# Patient Record
Sex: Female | Born: 1945 | Race: Black or African American | Hispanic: No | Smoking: Former smoker
Health system: Southern US, Community
[De-identification: ages and names within clinical notes are randomized; demographics above are authoritative.]

## PROBLEM LIST (undated history)

## (undated) DIAGNOSIS — N186 End stage renal disease: Secondary | ICD-10-CM

## (undated) DIAGNOSIS — R0789 Other chest pain: Secondary | ICD-10-CM

## (undated) DIAGNOSIS — I739 Peripheral vascular disease, unspecified: Secondary | ICD-10-CM

## (undated) DIAGNOSIS — I5189 Other ill-defined heart diseases: Secondary | ICD-10-CM

## (undated) DIAGNOSIS — D649 Anemia, unspecified: Secondary | ICD-10-CM

## (undated) DIAGNOSIS — R9439 Abnormal result of other cardiovascular function study: Secondary | ICD-10-CM

## (undated) DIAGNOSIS — I10 Essential (primary) hypertension: Secondary | ICD-10-CM

## (undated) DIAGNOSIS — H3530 Unspecified macular degeneration: Secondary | ICD-10-CM

## (undated) DIAGNOSIS — H409 Unspecified glaucoma: Secondary | ICD-10-CM

## (undated) DIAGNOSIS — E1129 Type 2 diabetes mellitus with other diabetic kidney complication: Secondary | ICD-10-CM

## (undated) DIAGNOSIS — M1990 Unspecified osteoarthritis, unspecified site: Secondary | ICD-10-CM

## (undated) DIAGNOSIS — E11319 Type 2 diabetes mellitus with unspecified diabetic retinopathy without macular edema: Secondary | ICD-10-CM

## (undated) DIAGNOSIS — E785 Hyperlipidemia, unspecified: Secondary | ICD-10-CM

## (undated) DIAGNOSIS — IMO0002 Reserved for concepts with insufficient information to code with codable children: Secondary | ICD-10-CM

## (undated) DIAGNOSIS — Z8669 Personal history of other diseases of the nervous system and sense organs: Secondary | ICD-10-CM

## (undated) DIAGNOSIS — N189 Chronic kidney disease, unspecified: Secondary | ICD-10-CM

## (undated) DIAGNOSIS — K219 Gastro-esophageal reflux disease without esophagitis: Secondary | ICD-10-CM

## (undated) DIAGNOSIS — Z992 Dependence on renal dialysis: Secondary | ICD-10-CM

## (undated) DIAGNOSIS — R9430 Abnormal result of cardiovascular function study, unspecified: Secondary | ICD-10-CM

## (undated) DIAGNOSIS — I2510 Atherosclerotic heart disease of native coronary artery without angina pectoris: Secondary | ICD-10-CM

## (undated) DIAGNOSIS — T82898A Other specified complication of vascular prosthetic devices, implants and grafts, initial encounter: Secondary | ICD-10-CM

## (undated) DIAGNOSIS — R7989 Other specified abnormal findings of blood chemistry: Secondary | ICD-10-CM

## (undated) DIAGNOSIS — R0609 Other forms of dyspnea: Secondary | ICD-10-CM

## (undated) DIAGNOSIS — I4891 Unspecified atrial fibrillation: Secondary | ICD-10-CM

## (undated) DIAGNOSIS — D631 Anemia in chronic kidney disease: Secondary | ICD-10-CM

## (undated) HISTORY — DX: Other ill-defined heart diseases: I51.89

## (undated) HISTORY — PX: TONSILLECTOMY: SUR1361

## (undated) HISTORY — DX: Anemia in chronic kidney disease: D63.1

## (undated) HISTORY — PX: LAPAROTOMY: SHX154

## (undated) HISTORY — DX: Essential (primary) hypertension: I10

## (undated) HISTORY — DX: Chronic kidney disease, unspecified: N18.9

## (undated) HISTORY — PX: COLONOSCOPY: SHX174

## (undated) HISTORY — DX: Other specified abnormal findings of blood chemistry: R79.89

## (undated) HISTORY — DX: Other specified complication of vascular prosthetic devices, implants and grafts, initial encounter: T82.898A

## (undated) HISTORY — DX: Abnormal result of cardiovascular function study, unspecified: R94.30

## (undated) HISTORY — DX: Other forms of dyspnea: R06.09

## (undated) HISTORY — PX: ABDOMINAL HYSTERECTOMY: SHX81

## (undated) HISTORY — DX: Other chest pain: R07.89

## (undated) HISTORY — PX: OTHER SURGICAL HISTORY: SHX169

## (undated) HISTORY — DX: Atherosclerotic heart disease of native coronary artery without angina pectoris: I25.10

## (undated) HISTORY — DX: Reserved for concepts with insufficient information to code with codable children: IMO0002

## (undated) HISTORY — DX: Gastro-esophageal reflux disease without esophagitis: K21.9

## (undated) HISTORY — DX: Unspecified osteoarthritis, unspecified site: M19.90

## (undated) HISTORY — DX: Abnormal result of other cardiovascular function study: R94.39

## (undated) HISTORY — DX: Peripheral vascular disease, unspecified: I73.9

## (undated) HISTORY — DX: Personal history of other diseases of the nervous system and sense organs: Z86.69

## (undated) HISTORY — DX: Hyperlipidemia, unspecified: E78.5

## (undated) HISTORY — PX: ARTERIOVENOUS GRAFT PLACEMENT: SUR1029

## (undated) HISTORY — PX: EYE SURGERY: SHX253

## (undated) HISTORY — DX: Type 2 diabetes mellitus with other diabetic kidney complication: E11.29

---

## 2005-11-25 DIAGNOSIS — Z8669 Personal history of other diseases of the nervous system and sense organs: Secondary | ICD-10-CM

## 2005-11-25 HISTORY — DX: Personal history of other diseases of the nervous system and sense organs: Z86.69

## 2007-05-14 ENCOUNTER — Other Ambulatory Visit: Admission: RE | Admit: 2007-05-14 | Discharge: 2007-05-14 | Payer: Self-pay | Admitting: Internal Medicine

## 2010-08-15 ENCOUNTER — Ambulatory Visit: Payer: Self-pay | Admitting: Gynecology

## 2010-08-15 ENCOUNTER — Other Ambulatory Visit: Payer: Self-pay | Admitting: Obstetrics & Gynecology

## 2010-08-16 ENCOUNTER — Inpatient Hospital Stay (HOSPITAL_COMMUNITY): Admission: EM | Admit: 2010-08-16 | Discharge: 2010-08-22 | Payer: Self-pay | Admitting: Emergency Medicine

## 2011-01-23 ENCOUNTER — Other Ambulatory Visit (HOSPITAL_COMMUNITY): Payer: Self-pay | Admitting: Nephrology

## 2011-01-23 ENCOUNTER — Ambulatory Visit (HOSPITAL_COMMUNITY)
Admission: RE | Admit: 2011-01-23 | Discharge: 2011-01-23 | Disposition: A | Discharge status: Home / Self Care | Payer: 59 | Source: Ambulatory Visit | Attending: Nephrology | Admitting: Nephrology

## 2011-01-23 DIAGNOSIS — N186 End stage renal disease: Secondary | ICD-10-CM

## 2011-02-07 LAB — PROTIME-INR: Prothrombin Time: 13 seconds (ref 11.6–15.2)

## 2011-02-07 LAB — URINALYSIS, ROUTINE W REFLEX MICROSCOPIC
Bilirubin Urine: NEGATIVE
Glucose, UA: NEGATIVE mg/dL
Ketones, ur: NEGATIVE mg/dL
Nitrite: NEGATIVE
Protein, ur: >300 mg/dL — AB
pH: 5.5 (ref 5.0–8.0)

## 2011-02-07 LAB — DIFFERENTIAL
Basophils Absolute: 0 10*3/uL (ref 0.0–0.1)
Basophils Absolute: 0 10*3/uL (ref 0.0–0.1)
Basophils Absolute: 0 10*3/uL (ref 0.0–0.1)
Basophils Relative: 0 % (ref 0–1)
Basophils Relative: 1 % (ref 0–1)
Basophils Relative: 1 % (ref 0–1)
Eosinophils Absolute: 0 10*3/uL (ref 0.0–0.7)
Eosinophils Relative: 0 % (ref 0–5)
Eosinophils Relative: 1 % (ref 0–5)
Eosinophils Relative: 1 % (ref 0–5)
Lymphocytes Relative: 32 % (ref 12–46)
Lymphocytes Relative: 42 % (ref 12–46)
Lymphocytes Relative: 51 % — ABNORMAL HIGH (ref 12–46)
Lymphocytes Relative: 52 % — ABNORMAL HIGH (ref 12–46)
Lymphs Abs: 1.4 10*3/uL (ref 0.7–4.0)
Lymphs Abs: 2.2 10*3/uL (ref 0.7–4.0)
Monocytes Absolute: 0.4 10*3/uL (ref 0.1–1.0)
Monocytes Absolute: 0.5 10*3/uL (ref 0.1–1.0)
Monocytes Absolute: 0.7 10*3/uL (ref 0.1–1.0)
Monocytes Relative: 10 % (ref 3–12)
Monocytes Relative: 7 % (ref 3–12)
Monocytes Relative: 7 % (ref 3–12)
Monocytes Relative: 7 % (ref 3–12)
Neutro Abs: 1.8 10*3/uL (ref 1.7–7.7)
Neutro Abs: 2.4 10*3/uL (ref 1.7–7.7)
Neutro Abs: 2.5 10*3/uL (ref 1.7–7.7)
Neutro Abs: 3.3 10*3/uL (ref 1.7–7.7)
Neutro Abs: 3.7 10*3/uL (ref 1.7–7.7)
Neutrophils Relative %: 46 % (ref 43–77)
Neutrophils Relative %: 50 % (ref 43–77)
Neutrophils Relative %: 66 % (ref 43–77)

## 2011-02-07 LAB — COMPREHENSIVE METABOLIC PANEL
ALT: 26 U/L (ref 0–35)
ALT: 38 U/L — ABNORMAL HIGH (ref 0–35)
AST: 28 U/L (ref 0–37)
AST: 39 U/L — ABNORMAL HIGH (ref 0–37)
AST: 41 U/L — ABNORMAL HIGH (ref 0–37)
AST: 52 U/L — ABNORMAL HIGH (ref 0–37)
Albumin: 2.6 g/dL — ABNORMAL LOW (ref 3.5–5.2)
Albumin: 2.8 g/dL — ABNORMAL LOW (ref 3.5–5.2)
Albumin: 2.8 g/dL — ABNORMAL LOW (ref 3.5–5.2)
Albumin: 3.3 g/dL — ABNORMAL LOW (ref 3.5–5.2)
Alkaline Phosphatase: 29 U/L — ABNORMAL LOW (ref 39–117)
Alkaline Phosphatase: 32 U/L — ABNORMAL LOW (ref 39–117)
Alkaline Phosphatase: 33 U/L — ABNORMAL LOW (ref 39–117)
Alkaline Phosphatase: 33 U/L — ABNORMAL LOW (ref 39–117)
BUN: 24 mg/dL — ABNORMAL HIGH (ref 6–23)
BUN: 46 mg/dL — ABNORMAL HIGH (ref 6–23)
BUN: 58 mg/dL — ABNORMAL HIGH (ref 6–23)
BUN: 64 mg/dL — ABNORMAL HIGH (ref 6–23)
CO2: 25 mEq/L (ref 19–32)
CO2: 26 mEq/L (ref 19–32)
CO2: 28 mEq/L (ref 19–32)
Calcium: 8.8 mg/dL (ref 8.4–10.5)
Calcium: 9.9 mg/dL (ref 8.4–10.5)
Chloride: 103 mEq/L (ref 96–112)
Chloride: 105 mEq/L (ref 96–112)
Creatinine, Ser: 4.1 mg/dL — ABNORMAL HIGH (ref 0.4–1.2)
Creatinine, Ser: 4.14 mg/dL — ABNORMAL HIGH (ref 0.4–1.2)
Creatinine, Ser: 5.84 mg/dL — ABNORMAL HIGH (ref 0.4–1.2)
Creatinine, Ser: 6.66 mg/dL — ABNORMAL HIGH (ref 0.4–1.2)
GFR calc Af Amer: 10 mL/min — ABNORMAL LOW (ref >60)
GFR calc Af Amer: 13 mL/min — ABNORMAL LOW (ref >60)
GFR calc Af Amer: 7 mL/min — ABNORMAL LOW (ref >60)
GFR calc non Af Amer: 11 mL/min — ABNORMAL LOW (ref >60)
GFR calc non Af Amer: 6 mL/min — ABNORMAL LOW (ref >60)
GFR calc non Af Amer: 8 mL/min — ABNORMAL LOW (ref >60)
Glucose, Bld: 170 mg/dL — ABNORMAL HIGH (ref 70–99)
Glucose, Bld: 171 mg/dL — ABNORMAL HIGH (ref 70–99)
Potassium: 3.6 mEq/L (ref 3.5–5.1)
Potassium: 4.1 mEq/L (ref 3.5–5.1)
Potassium: 4.4 mEq/L (ref 3.5–5.1)
Potassium: 4.5 mEq/L (ref 3.5–5.1)
Potassium: 4.7 mEq/L (ref 3.5–5.1)
Sodium: 140 mEq/L (ref 135–145)
Total Bilirubin: 0.4 mg/dL (ref 0.3–1.2)
Total Bilirubin: 0.5 mg/dL (ref 0.3–1.2)
Total Bilirubin: 0.5 mg/dL (ref 0.3–1.2)
Total Protein: 5.3 g/dL — ABNORMAL LOW (ref 6.0–8.3)
Total Protein: 5.3 g/dL — ABNORMAL LOW (ref 6.0–8.3)
Total Protein: 5.3 g/dL — ABNORMAL LOW (ref 6.0–8.3)
Total Protein: 6.1 g/dL (ref 6.0–8.3)

## 2011-02-07 LAB — GLUCOSE, CAPILLARY
Glucose-Capillary: 107 mg/dL — ABNORMAL HIGH (ref 70–99)
Glucose-Capillary: 116 mg/dL — ABNORMAL HIGH (ref 70–99)
Glucose-Capillary: 116 mg/dL — ABNORMAL HIGH (ref 70–99)
Glucose-Capillary: 120 mg/dL — ABNORMAL HIGH (ref 70–99)
Glucose-Capillary: 132 mg/dL — ABNORMAL HIGH (ref 70–99)
Glucose-Capillary: 138 mg/dL — ABNORMAL HIGH (ref 70–99)
Glucose-Capillary: 139 mg/dL — ABNORMAL HIGH (ref 70–99)
Glucose-Capillary: 153 mg/dL — ABNORMAL HIGH (ref 70–99)
Glucose-Capillary: 163 mg/dL — ABNORMAL HIGH (ref 70–99)
Glucose-Capillary: 170 mg/dL — ABNORMAL HIGH (ref 70–99)
Glucose-Capillary: 174 mg/dL — ABNORMAL HIGH (ref 70–99)
Glucose-Capillary: 175 mg/dL — ABNORMAL HIGH (ref 70–99)
Glucose-Capillary: 175 mg/dL — ABNORMAL HIGH (ref 70–99)
Glucose-Capillary: 180 mg/dL — ABNORMAL HIGH (ref 70–99)
Glucose-Capillary: 180 mg/dL — ABNORMAL HIGH (ref 70–99)
Glucose-Capillary: 227 mg/dL — ABNORMAL HIGH (ref 70–99)
Glucose-Capillary: 232 mg/dL — ABNORMAL HIGH (ref 70–99)
Glucose-Capillary: 278 mg/dL — ABNORMAL HIGH (ref 70–99)
Glucose-Capillary: 293 mg/dL — ABNORMAL HIGH (ref 70–99)
Glucose-Capillary: 299 mg/dL — ABNORMAL HIGH (ref 70–99)
Glucose-Capillary: 310 mg/dL — ABNORMAL HIGH (ref 70–99)
Glucose-Capillary: 329 mg/dL — ABNORMAL HIGH (ref 70–99)
Glucose-Capillary: 367 mg/dL — ABNORMAL HIGH (ref 70–99)
Glucose-Capillary: 399 mg/dL — ABNORMAL HIGH (ref 70–99)
Glucose-Capillary: 73 mg/dL (ref 70–99)
Glucose-Capillary: 96 mg/dL (ref 70–99)

## 2011-02-07 LAB — HEPATITIS B SURFACE ANTIGEN: Hepatitis B Surface Ag: NEGATIVE

## 2011-02-07 LAB — PHOSPHORUS: Phosphorus: 3.9 mg/dL (ref 2.3–4.6)

## 2011-02-07 LAB — CBC
HCT: 27.3 % — ABNORMAL LOW (ref 36.0–46.0)
HCT: 28 % — ABNORMAL LOW (ref 36.0–46.0)
Hemoglobin: 10.1 g/dL — ABNORMAL LOW (ref 12.0–15.0)
Hemoglobin: 6.8 g/dL — CL (ref 12.0–15.0)
Hemoglobin: 7.3 g/dL — ABNORMAL LOW (ref 12.0–15.0)
Hemoglobin: 8.9 g/dL — ABNORMAL LOW (ref 12.0–15.0)
Hemoglobin: 9.4 g/dL — ABNORMAL LOW (ref 12.0–15.0)
MCH: 27.1 pg (ref 26.0–34.0)
MCH: 27.1 pg (ref 26.0–34.0)
MCH: 28.1 pg (ref 26.0–34.0)
MCH: 28.2 pg (ref 26.0–34.0)
MCH: 28.3 pg (ref 26.0–34.0)
MCH: 28.6 pg (ref 26.0–34.0)
MCHC: 32.2 g/dL (ref 30.0–36.0)
MCHC: 32.3 g/dL (ref 30.0–36.0)
MCHC: 32.5 g/dL (ref 30.0–36.0)
MCHC: 32.6 g/dL (ref 30.0–36.0)
MCHC: 33.2 g/dL (ref 30.0–36.0)
MCV: 84.1 fL (ref 78.0–100.0)
MCV: 84.6 fL (ref 78.0–100.0)
MCV: 86.4 fL (ref 78.0–100.0)
MCV: 88.1 fL (ref 78.0–100.0)
Platelets: 280 10*3/uL (ref 150–400)
Platelets: 281 10*3/uL (ref 150–400)
Platelets: 283 10*3/uL (ref 150–400)
Platelets: 340 10*3/uL (ref 150–400)
RBC: 2.51 MIL/uL — ABNORMAL LOW (ref 3.87–5.11)
RBC: 2.69 MIL/uL — ABNORMAL LOW (ref 3.87–5.11)
RBC: 3.32 MIL/uL — ABNORMAL LOW (ref 3.87–5.11)
RDW: 15.1 % (ref 11.5–15.5)
RDW: 15.2 % (ref 11.5–15.5)
RDW: 16.1 % — ABNORMAL HIGH (ref 11.5–15.5)
RDW: 16.1 % — ABNORMAL HIGH (ref 11.5–15.5)
WBC: 5.3 10*3/uL (ref 4.0–10.5)
WBC: 6.3 10*3/uL (ref 4.0–10.5)
WBC: 6.8 10*3/uL (ref 4.0–10.5)

## 2011-02-07 LAB — CROSSMATCH

## 2011-02-07 LAB — RENAL FUNCTION PANEL
BUN: 43 mg/dL — ABNORMAL HIGH (ref 6–23)
CO2: 26 mEq/L (ref 19–32)
Calcium: 8.4 mg/dL (ref 8.4–10.5)
Glucose, Bld: 214 mg/dL — ABNORMAL HIGH (ref 70–99)
Sodium: 135 mEq/L (ref 135–145)

## 2011-02-07 LAB — MAGNESIUM
Magnesium: 2.1 mg/dL (ref 1.5–2.5)
Magnesium: 2.2 mg/dL (ref 1.5–2.5)

## 2011-02-07 LAB — CARDIAC PANEL(CRET KIN+CKTOT+MB+TROPI)
CK, MB: 3.1 ng/mL (ref 0.3–4.0)
CK, MB: 3.1 ng/mL (ref 0.3–4.0)
Relative Index: 0.8 (ref 0.0–2.5)
Total CK: 402 U/L — ABNORMAL HIGH (ref 7–177)
Troponin I: 0.02 ng/mL (ref 0.00–0.06)

## 2011-02-07 LAB — TSH: TSH: 1.397 u[IU]/mL (ref 0.350–4.500)

## 2011-02-07 LAB — C-PEPTIDE: C-Peptide: 2.65 ng/mL (ref 0.80–3.90)

## 2011-02-07 LAB — RAPID URINE DRUG SCREEN, HOSP PERFORMED
Amphetamines: NOT DETECTED
Cocaine: NOT DETECTED
Opiates: POSITIVE — AB
Tetrahydrocannabinol: NOT DETECTED

## 2011-02-07 LAB — CK TOTAL AND CKMB (NOT AT ARMC): Total CK: 468 U/L — ABNORMAL HIGH (ref 7–177)

## 2011-02-07 LAB — CULTURE, BLOOD (ROUTINE X 2)
Culture  Setup Time: 201109220845
Culture: NO GROWTH

## 2011-02-07 LAB — ABO/RH: ABO/RH(D): O POS

## 2011-02-07 LAB — IRON AND TIBC
Iron: 53 ug/dL (ref 42–135)
Saturation Ratios: 15 % — ABNORMAL LOW (ref 20–55)
TIBC: 359 ug/dL (ref 250–470)

## 2011-02-07 LAB — HEPATITIS A ANTIBODY, TOTAL: Hep A Total Ab: POSITIVE — AB

## 2011-02-22 ENCOUNTER — Emergency Department (HOSPITAL_COMMUNITY)
Admission: EM | Admit: 2011-02-22 | Discharge: 2011-02-23 | Disposition: A | Discharge status: Home / Self Care | Payer: Managed Care, Other (non HMO) | Attending: Emergency Medicine | Admitting: Emergency Medicine

## 2011-02-22 DIAGNOSIS — N189 Chronic kidney disease, unspecified: Secondary | ICD-10-CM | POA: Insufficient documentation

## 2011-02-22 DIAGNOSIS — N186 End stage renal disease: Secondary | ICD-10-CM

## 2011-02-22 DIAGNOSIS — E119 Type 2 diabetes mellitus without complications: Secondary | ICD-10-CM | POA: Insufficient documentation

## 2011-02-22 DIAGNOSIS — E7800 Pure hypercholesterolemia, unspecified: Secondary | ICD-10-CM | POA: Insufficient documentation

## 2011-02-22 DIAGNOSIS — Z79899 Other long term (current) drug therapy: Secondary | ICD-10-CM | POA: Insufficient documentation

## 2011-02-22 DIAGNOSIS — M1990 Unspecified osteoarthritis, unspecified site: Secondary | ICD-10-CM | POA: Insufficient documentation

## 2011-02-22 DIAGNOSIS — T8132XA Disruption of internal operation (surgical) wound, not elsewhere classified, initial encounter: Secondary | ICD-10-CM

## 2011-02-22 DIAGNOSIS — I120 Hypertensive chronic kidney disease with stage 5 chronic kidney disease or end stage renal disease: Secondary | ICD-10-CM

## 2011-02-22 DIAGNOSIS — Y841 Kidney dialysis as the cause of abnormal reaction of the patient, or of later complication, without mention of misadventure at the time of the procedure: Secondary | ICD-10-CM | POA: Insufficient documentation

## 2011-02-22 DIAGNOSIS — T827XXA Infection and inflammatory reaction due to other cardiac and vascular devices, implants and grafts, initial encounter: Secondary | ICD-10-CM | POA: Insufficient documentation

## 2011-02-22 DIAGNOSIS — Z992 Dependence on renal dialysis: Secondary | ICD-10-CM | POA: Insufficient documentation

## 2011-02-22 DIAGNOSIS — I129 Hypertensive chronic kidney disease with stage 1 through stage 4 chronic kidney disease, or unspecified chronic kidney disease: Secondary | ICD-10-CM | POA: Insufficient documentation

## 2011-02-22 LAB — BASIC METABOLIC PANEL
Calcium: 9.6 mg/dL (ref 8.4–10.5)
Creatinine, Ser: 4.23 mg/dL — ABNORMAL HIGH (ref 0.4–1.2)
GFR calc Af Amer: 13 mL/min — ABNORMAL LOW (ref >60)
GFR calc non Af Amer: 11 mL/min — ABNORMAL LOW (ref >60)
Sodium: 138 mEq/L (ref 135–145)

## 2011-02-22 LAB — DIFFERENTIAL
Basophils Absolute: 0 10*3/uL (ref 0.0–0.1)
Basophils Relative: 0 % (ref 0–1)
Eosinophils Absolute: 0.8 10*3/uL — ABNORMAL HIGH (ref 0.0–0.7)
Eosinophils Relative: 8 % — ABNORMAL HIGH (ref 0–5)
Monocytes Absolute: 0.8 10*3/uL (ref 0.1–1.0)
Neutro Abs: 5.6 10*3/uL (ref 1.7–7.7)

## 2011-02-22 LAB — CBC
MCH: 29.6 pg (ref 26.0–34.0)
MCHC: 31.1 g/dL (ref 30.0–36.0)
Platelets: 453 10*3/uL — ABNORMAL HIGH (ref 150–400)
RDW: 17 % — ABNORMAL HIGH (ref 11.5–15.5)

## 2011-02-25 NOTE — Consult Note (Signed)
  Colleen Mullins, Colleen Mullins               ACCOUNT NO.:  [id number]  MEDICAL RECORD NO.:  [id number]           PATIENT TYPE:  E  LOCATION:  [facility]                         FACILITY:  [facility]  PHYSICIAN:  Quita Skye. Hart Rochester, M.D.  DATE OF BIRTH:  Sep 02, 1946  DATE OF CONSULTATION:  02/22/2011 DATE OF DISCHARGE:  02/23/2011                                CONSULTATION   REASON FOR CONSULTATION:  Wound separation left thigh AV graft area.  HISTORY OF PRESENT ILLNESS:  This 65 year old female patient with end- stage renal disease on hemodialysis Monday, Wednesday and Friday at the [facility] location had a left thigh AV graft inserted by Dr. Meredeth Ide in [facility] on January 25, 2011.  She also had this short segment of infected left upper arm AV graft removed by him about 1 week ago.  She denies any chills or fever but was noted to have some wound separation on dialysis today and was referred to the emergency room.  She has an appointment to return to Dr. Reita Cliche office in 10 days for followup regarding the arm graft.  PAST MEDICAL HISTORY:  Negative for coronary artery disease.  Positive for: 1. Type 1 diabetes mellitus. 2. Positive for hypertension. 3. Negative for COPD or stroke.  FAMILY HISTORY:  Positive for diabetes.  Negative for coronary artery disease or stroke.  REVIEW OF SYSTEMS:  The patient denies any chest pain, dyspnea on exertion, PND, orthopnea.  No chronic bronchitis.  Main problem today associated with her inguinal wound.  PHYSICAL EXAMINATION:  VITAL SIGNS:  Temperature 98, blood pressure 130/70, heart rate is 80, respirations 14. GENERAL:  She is well-developed, well-nourished female in no apparent distress, alert and oriented x3. HEENT:  Normal for age.  EOMs intact. CHEST:  Clear to auscultation.  No rhonchi or wheezing. CARDIOVASCULAR:  Regular rhythm.  No murmurs.  Carotid pulses are 3+. No audible bruits. ABDOMEN:  Obese.  No palpable masses. EXTREMITIES:  Lower  extremity exam reveals the left thigh to have an AV graft with an excellent pulse and palpable thrill.  The left foot is well-perfused.  The distal end of the left groin wound is separated over a distance of 1-2 cm and about 1 cm deep.  There is no purulent drainage.  There is some serosanguineous fluid in the wound.  The graft as well medial and lateral to the opening and is not exposed.  The wound was cleaned with a 2 x 2 gauze and packed with moist gauze, dry dressing placed on top of this and secured with tape.  The patient's daughter who lives with the patient has instructed to clean the wound and pack it twice daily through the weekend.  She received vancomycin on hemodialysis today and will also receive that on Monday and will report to Dr. Meredeth Ide for further followup for this wound either Monday or Tuesday at the latest.     Fayrene Fearing D. Hart Rochester, M.D.     JDL/MEDQ  D:  02/22/2011  T:  02/23/2011  Job:  [id number]  Electronically Signed by Josephina Gip M.D. on 02/25/2011 10:25:27 AM

## 2011-03-12 LAB — HM HEPATITIS C SCREENING LAB: HM HEPATITIS C SCREENING: NEGATIVE

## 2011-04-16 ENCOUNTER — Other Ambulatory Visit (HOSPITAL_COMMUNITY): Payer: Self-pay | Admitting: Nephrology

## 2011-04-16 DIAGNOSIS — N186 End stage renal disease: Secondary | ICD-10-CM

## 2011-04-23 ENCOUNTER — Emergency Department (HOSPITAL_COMMUNITY)
Admission: EM | Admit: 2011-04-23 | Discharge: 2011-04-23 | Disposition: A | Discharge status: Home / Self Care | Payer: Medicare HMO | Attending: Emergency Medicine | Admitting: Emergency Medicine

## 2011-04-23 ENCOUNTER — Ambulatory Visit (HOSPITAL_COMMUNITY)
Admission: RE | Admit: 2011-04-23 | Discharge: 2011-04-23 | Disposition: A | Discharge status: Home / Self Care | Payer: 59 | Source: Ambulatory Visit | Attending: Nephrology | Admitting: Nephrology

## 2011-04-23 DIAGNOSIS — N186 End stage renal disease: Secondary | ICD-10-CM | POA: Insufficient documentation

## 2011-04-23 DIAGNOSIS — Z538 Procedure and treatment not carried out for other reasons: Secondary | ICD-10-CM | POA: Insufficient documentation

## 2011-04-23 DIAGNOSIS — R071 Chest pain on breathing: Secondary | ICD-10-CM | POA: Insufficient documentation

## 2011-04-23 LAB — GLUCOSE, CAPILLARY: Glucose-Capillary: 84 mg/dL (ref 70–99)

## 2011-08-16 ENCOUNTER — Encounter: Payer: Self-pay | Admitting: Physician Assistant

## 2011-08-21 ENCOUNTER — Encounter: Payer: Self-pay | Admitting: Physician Assistant

## 2011-08-22 ENCOUNTER — Ambulatory Visit: Payer: Self-pay | Admitting: *Deleted

## 2011-08-22 ENCOUNTER — Ambulatory Visit: Payer: Self-pay

## 2011-09-03 ENCOUNTER — Encounter: Payer: Self-pay | Admitting: *Deleted

## 2011-09-03 ENCOUNTER — Encounter: Payer: Managed Care, Other (non HMO) | Attending: Nephrology | Admitting: *Deleted

## 2011-09-03 DIAGNOSIS — Z713 Dietary counseling and surveillance: Secondary | ICD-10-CM | POA: Insufficient documentation

## 2011-09-03 DIAGNOSIS — E119 Type 2 diabetes mellitus without complications: Secondary | ICD-10-CM | POA: Insufficient documentation

## 2011-09-03 NOTE — Progress Notes (Signed)
  Medical Nutrition Therapy:  Appt start time: 0900 end time:  1030.   Assessment:  Primary concerns today: Nutrition counseling for Type 2 Diabetes. Patient is receiving dialysis 3 times per week and states she is already familiar with the nutrition guidelines for that as she meets with the nutritionist there pretty regularly. Her appetite is decreasing so she only eats about half of the food she prepares.  MEDICATIONS: See list. Diabetes meds are Lantus now 45 units at bedtime and she states she takes sliding scale of Novolog 70/30 pre-breakfast.   DIETARY INTAKE:  Usual eating pattern includes 3 meals and 2-3 snacks per day.  Everyday foods include variety of most food groups.  Avoided foods include high potassium, high phosphorus and high sodium foods due to renal failure.    24-hr recall:  B ( AM): grits, eggs, 1/2 slice toast, bacon or sausage  Snk ( AM): cheese or grapes  L ( PM): 1/2 [place] and cheese sandwich Snk ( PM): popcorn D ( PM): lean meat, allowed vegetable, occasional starch Snk ( PM): occasional Beverages: limited due to fluid restriction  Usual physical activity: limited due to walking with cane, post stroke in 2010. She does not drive due to retinopathy, walks 2 blocks to bus stop for transportation to appointments  Estimated energy needs: 1400 calories 158 g carbohydrates 105 g protein 39 g fat  Progress Towards Goal(s):  In progress.   Nutritional Diagnosis:  Consistent Carb Meal Plan adapted to renal modifications    Intervention:  Nutrition counseling on diabetes self management including consistent Carb Meal plan with renal modifications.  Handouts given during visit include:  Carb Counting and Meal Planning by NovoNordisk  Carb Counting and Beyond handout  Monitoring/Evaluation:  Dietary intake, exercise, continue self monitoring of BG 4 times per day, and body weight prn. Patient to call me with any questions and make appointment in future if  needed.

## 2011-09-25 ENCOUNTER — Encounter: Payer: Self-pay | Admitting: Vascular Surgery

## 2011-09-26 ENCOUNTER — Ambulatory Visit (INDEPENDENT_AMBULATORY_CARE_PROVIDER_SITE_OTHER): Payer: Managed Care, Other (non HMO) | Admitting: Vascular Surgery

## 2011-09-26 ENCOUNTER — Encounter: Payer: Self-pay | Admitting: Vascular Surgery

## 2011-09-26 VITALS — BP 147/71 | HR 84 | Temp 98.4°F | Ht 66.0 in | Wt 192.0 lb

## 2011-09-26 DIAGNOSIS — E1159 Type 2 diabetes mellitus with other circulatory complications: Secondary | ICD-10-CM

## 2011-09-26 DIAGNOSIS — T82898A Other specified complication of vascular prosthetic devices, implants and grafts, initial encounter: Secondary | ICD-10-CM | POA: Insufficient documentation

## 2011-09-26 DIAGNOSIS — E1129 Type 2 diabetes mellitus with other diabetic kidney complication: Secondary | ICD-10-CM

## 2011-09-26 DIAGNOSIS — N186 End stage renal disease: Secondary | ICD-10-CM

## 2011-09-26 HISTORY — DX: Type 2 diabetes mellitus with other diabetic kidney complication: E11.29

## 2011-09-26 NOTE — Progress Notes (Signed)
[facility] [place] HISTORY AND PHYSICAL    History of Present Illness:  Patient is a 64 y.o. year old female who presents complaining of pain and a left forearm AV graft. The graft was originally placed by Dr. Meredeth Ide and [facility]. She states that it has been in place for some time and was never functional. She denies any fever or chills. There is been no drainage from the graft. The pain is primarily at the apex of the graft.   Physical Examination  Filed Vitals:   09/26/11 1343  BP: 147/71  Pulse: 84  Temp: 98.4 F (36.9 C)    Body mass index is 30.99 kg/(m^2).  General:  Alert and oriented, no acute distress Skin: No rash Extremities:  One plus left radial pulse, left forearm graft without bruit, no erythema, no pain on palpation Neurologic: Upper and lower extremity motor 5/5 and symmetric    ASSESSMENT: Pain in nonfunctional left forearm AV graft. Had lengthy discussion with the patient today that we could remove the graft but this will most likely are extensive dissection because graft is probably going to be well incorporated. I also discussed with her that she may tray a pre-existing pain for a new pain due to the amount of trauma she would undergo to remove the graft. She states that she understands and also understands the risk of possible infection. She wishes to proceed with graft removal stating that she is willing to risk the possibility of a different set of pain in order to possibly obtain benefit of relief from her existing pain.   PLAN: We will schedule her for removal of a forearm AV graft on 10/08/2011. Risks benefits possible complications and procedure details were explained the patient today including but limited to bleeding, infection, continued pain in the left forearm.   Fabienne Bruns, MD [facility] [place] Office: [phone] Pager: [phone]

## 2011-10-04 ENCOUNTER — Encounter (HOSPITAL_COMMUNITY): Payer: Self-pay | Admitting: Pharmacy Technician

## 2011-10-07 ENCOUNTER — Other Ambulatory Visit: Payer: Self-pay | Admitting: *Deleted

## 2011-10-07 ENCOUNTER — Encounter (HOSPITAL_COMMUNITY): Payer: Self-pay | Admitting: *Deleted

## 2011-10-07 MED ORDER — DEXTROSE 5 % IV SOLN
1.5000 g | Freq: Once | INTRAVENOUS | Status: DC
  Filled 2011-10-07: qty 1.5

## 2011-10-07 MED ORDER — SODIUM CHLORIDE 0.9 % IV SOLN: INTRAVENOUS | Status: DC

## 2011-10-08 ENCOUNTER — Encounter (HOSPITAL_COMMUNITY)
Admission: RE | Disposition: A | Discharge status: Home / Self Care | Payer: Self-pay | Source: Ambulatory Visit | Attending: Vascular Surgery

## 2011-10-08 ENCOUNTER — Ambulatory Visit (HOSPITAL_COMMUNITY): Payer: Managed Care, Other (non HMO) | Admitting: *Deleted

## 2011-10-08 ENCOUNTER — Ambulatory Visit (HOSPITAL_COMMUNITY)
Admission: RE | Admit: 2011-10-08 | Discharge: 2011-10-08 | Disposition: A | Discharge status: Home / Self Care | Payer: Managed Care, Other (non HMO) | Source: Ambulatory Visit | Attending: Vascular Surgery | Admitting: Vascular Surgery

## 2011-10-08 ENCOUNTER — Encounter (HOSPITAL_COMMUNITY): Payer: Self-pay | Admitting: *Deleted

## 2011-10-08 DIAGNOSIS — T82898A Other specified complication of vascular prosthetic devices, implants and grafts, initial encounter: Secondary | ICD-10-CM | POA: Insufficient documentation

## 2011-10-08 DIAGNOSIS — T827XXA Infection and inflammatory reaction due to other cardiac and vascular devices, implants and grafts, initial encounter: Secondary | ICD-10-CM

## 2011-10-08 DIAGNOSIS — N186 End stage renal disease: Secondary | ICD-10-CM

## 2011-10-08 DIAGNOSIS — Z992 Dependence on renal dialysis: Secondary | ICD-10-CM | POA: Insufficient documentation

## 2011-10-08 DIAGNOSIS — I120 Hypertensive chronic kidney disease with stage 5 chronic kidney disease or end stage renal disease: Secondary | ICD-10-CM | POA: Insufficient documentation

## 2011-10-08 DIAGNOSIS — Z794 Long term (current) use of insulin: Secondary | ICD-10-CM | POA: Insufficient documentation

## 2011-10-08 DIAGNOSIS — E119 Type 2 diabetes mellitus without complications: Secondary | ICD-10-CM | POA: Insufficient documentation

## 2011-10-08 DIAGNOSIS — Y849 Medical procedure, unspecified as the cause of abnormal reaction of the patient, or of later complication, without mention of misadventure at the time of the procedure: Secondary | ICD-10-CM | POA: Insufficient documentation

## 2011-10-08 HISTORY — DX: Dependence on renal dialysis: Z99.2

## 2011-10-08 HISTORY — PX: AVGG REMOVAL: SHX5153

## 2011-10-08 LAB — GLUCOSE, CAPILLARY: Glucose-Capillary: 120 mg/dL — ABNORMAL HIGH (ref 70–99)

## 2011-10-08 SURGERY — REMOVAL OF ARTERIOVENOUS GORETEX GRAFT (AVGG)
Anesthesia: Monitor Anesthesia Care | Site: Arm Lower | Laterality: Left

## 2011-10-08 MED ORDER — SODIUM CHLORIDE 0.9 % IV SOLN
INTRAVENOUS | Status: DC | PRN
  Administered 2011-10-08: 08:00:00 via INTRAVENOUS

## 2011-10-08 MED ORDER — ONDANSETRON HCL 4 MG/2ML IJ SOLN
INTRAMUSCULAR | Status: DC | PRN
  Administered 2011-10-08: 4 mg via INTRAVENOUS

## 2011-10-08 MED ORDER — MEPERIDINE HCL 25 MG/ML IJ SOLN: 6.2500 mg | INTRAMUSCULAR | Status: DC | PRN

## 2011-10-08 MED ORDER — PROPOFOL 10 MG/ML IV EMUL
INTRAVENOUS | Status: DC | PRN
  Administered 2011-10-08: 180 mg via INTRAVENOUS

## 2011-10-08 MED ORDER — SODIUM CHLORIDE 0.9 % IR SOLN
Status: DC | PRN
  Administered 2011-10-08: 1000 mL

## 2011-10-08 MED ORDER — PROMETHAZINE HCL 25 MG/ML IJ SOLN: 6.2500 mg | INTRAMUSCULAR | Status: DC | PRN

## 2011-10-08 MED ORDER — EPHEDRINE SULFATE 50 MG/ML IJ SOLN
INTRAMUSCULAR | Status: DC | PRN
  Administered 2011-10-08 (×2): 5 mg via INTRAVENOUS

## 2011-10-08 MED ORDER — DEXTROSE 5 % IV SOLN
1.5000 g | INTRAVENOUS | Status: DC | PRN
  Administered 2011-10-08: 1.5 g via INTRAVENOUS

## 2011-10-08 MED ORDER — HYDROMORPHONE HCL PF 1 MG/ML IJ SOLN
0.2500 mg | INTRAMUSCULAR | Status: DC | PRN
  Administered 2011-10-08 (×2): 0.25 mg via INTRAVENOUS
  Administered 2011-10-08: 0.5 mg via INTRAVENOUS

## 2011-10-08 MED ORDER — FENTANYL CITRATE 0.05 MG/ML IJ SOLN
INTRAMUSCULAR | Status: DC | PRN
  Administered 2011-10-08 (×2): 50 ug via INTRAVENOUS

## 2011-10-08 MED ORDER — OXYCODONE HCL 5 MG PO TABS
5.0000 mg | ORAL_TABLET | Freq: Once | ORAL | Status: AC
  Administered 2011-10-08: 5 mg via ORAL

## 2011-10-08 MED ORDER — LIDOCAINE HCL (CARDIAC) 20 MG/ML IV SOLN
INTRAVENOUS | Status: DC | PRN
  Administered 2011-10-08: 80 mg via INTRAVENOUS

## 2011-10-08 MED ORDER — SODIUM CHLORIDE 0.9 % IV SOLN: INTRAVENOUS | Status: DC

## 2011-10-08 MED ORDER — MIDAZOLAM HCL 2 MG/2ML IJ SOLN: 0.5000 mg | Freq: Once | INTRAMUSCULAR | Status: DC | PRN

## 2011-10-08 MED ORDER — MIDAZOLAM HCL 5 MG/5ML IJ SOLN
INTRAMUSCULAR | Status: DC | PRN
  Administered 2011-10-08: 2 mg via INTRAVENOUS

## 2011-10-08 MED ORDER — MORPHINE SULFATE 2 MG/ML IJ SOLN: 0.0500 mg/kg | INTRAMUSCULAR | Status: DC | PRN

## 2011-10-08 SURGICAL SUPPLY — 41 items
BANDAGE ACE 4 STERILE (GAUZE/BANDAGES/DRESSINGS) ×2 IMPLANT
BANDAGE ELASTIC 4 VELCRO ST LF (GAUZE/BANDAGES/DRESSINGS) ×2 IMPLANT
BANDAGE GAUZE 4  KLING STR (GAUZE/BANDAGES/DRESSINGS) ×4 IMPLANT
CANISTER SUCTION 2500CC (MISCELLANEOUS) ×2 IMPLANT
CLIP TI MEDIUM 6 (CLIP) ×2 IMPLANT
CLIP TI WIDE RED SMALL 6 (CLIP) ×2 IMPLANT
CLOTH BEACON ORANGE TIMEOUT ST (SAFETY) ×2 IMPLANT
COVER SURGICAL LIGHT HANDLE (MISCELLANEOUS) ×4 IMPLANT
DECANTER SPIKE VIAL GLASS SM (MISCELLANEOUS) ×2 IMPLANT
DERMABOND ADVANCED (GAUZE/BANDAGES/DRESSINGS) ×1
DERMABOND ADVANCED .7 DNX12 (GAUZE/BANDAGES/DRESSINGS) ×1 IMPLANT
ELECT REM PT RETURN 9FT ADLT (ELECTROSURGICAL) ×2
ELECTRODE REM PT RTRN 9FT ADLT (ELECTROSURGICAL) ×1 IMPLANT
GAUZE SPONGE 4X4 12PLY STRL LF (GAUZE/BANDAGES/DRESSINGS) ×2 IMPLANT
GEL ULTRASOUND 20GR AQUASONIC (MISCELLANEOUS) IMPLANT
GLOVE BIO SURGEON STRL SZ 6.5 (GLOVE) ×2 IMPLANT
GLOVE BIO SURGEON STRL SZ7.5 (GLOVE) ×2 IMPLANT
GLOVE BIOGEL PI IND STRL 6.5 (GLOVE) ×1 IMPLANT
GLOVE BIOGEL PI IND STRL 7.0 (GLOVE) ×2 IMPLANT
GLOVE BIOGEL PI INDICATOR 6.5 (GLOVE) ×1
GLOVE BIOGEL PI INDICATOR 7.0 (GLOVE) ×2
GLOVE SS BIOGEL STRL SZ 7 (GLOVE) ×1 IMPLANT
GLOVE SUPERSENSE BIOGEL SZ 7 (GLOVE) ×1
GOWN PREVENTION PLUS XLARGE (GOWN DISPOSABLE) ×2 IMPLANT
GOWN STRL NON-REIN LRG LVL3 (GOWN DISPOSABLE) ×6 IMPLANT
KIT BASIN OR (CUSTOM PROCEDURE TRAY) ×2 IMPLANT
KIT ROOM TURNOVER OR (KITS) ×2 IMPLANT
NS IRRIG 1000ML POUR BTL (IV SOLUTION) ×2 IMPLANT
PACK CV ACCESS (CUSTOM PROCEDURE TRAY) ×2 IMPLANT
PAD ARMBOARD 7.5X6 YLW CONV (MISCELLANEOUS) ×2 IMPLANT
SPONGE SURGIFOAM ABS GEL 100 (HEMOSTASIS) IMPLANT
SUT PROLENE 6 0 CC (SUTURE) ×4 IMPLANT
SUT VIC AB 3-0 SH 27 (SUTURE) ×2
SUT VIC AB 3-0 SH 27X BRD (SUTURE) ×2 IMPLANT
SUT VICRYL 4-0 PS2 18IN ABS (SUTURE) ×4 IMPLANT
SWAB COLLECTION DEVICE MRSA (MISCELLANEOUS) ×2 IMPLANT
TAPE CLOTH SURG 4X10 WHT LF (GAUZE/BANDAGES/DRESSINGS) ×2 IMPLANT
TOWEL OR 17X24 6PK STRL BLUE (TOWEL DISPOSABLE) ×2 IMPLANT
TOWEL OR 17X26 10 PK STRL BLUE (TOWEL DISPOSABLE) ×2 IMPLANT
UNDERPAD 30X30 INCONTINENT (UNDERPADS AND DIAPERS) ×2 IMPLANT
WATER STERILE IRR 1000ML POUR (IV SOLUTION) ×2 IMPLANT

## 2011-10-08 NOTE — Transfer of Care (Signed)
Immediate Anesthesia Transfer of Care Note  Patient: Colleen Mullins  Procedure(s) Performed:  REMOVAL OF ARTERIOVENOUS GORETEX GRAFT (AVGG)  Patient Location: PACU  Anesthesia Type: General  Level of Consciousness: sedated and patient cooperative  Airway & Oxygen Therapy: Patient Spontanous Breathing and Patient connected to face mask oxygen  Post-op Assessment: Report given to PACU RN  Post vital signs: Reviewed and stable  Complications: No apparent anesthesia complications

## 2011-10-08 NOTE — Addendum Note (Signed)
Addendum  created 10/08/11 1140 by Trevyon Swor Bickley Corrina Steffensen   Modules edited:Anesthesia LDA    

## 2011-10-08 NOTE — Progress Notes (Signed)
The patient has been re-examined.  The patient's history and physical has been reviewed and is unchanged.  There is no change in the plan of care.  Fabienne Bruns, MD [facility] [place] Office: [phone] Pager: [phone]

## 2011-10-08 NOTE — Preoperative (Signed)
Beta Blockers   Reason not to administer Beta Blockers:Labetalol taken yesterday at 2230 10/07/11

## 2011-10-08 NOTE — Progress Notes (Addendum)
Pt received in PACU; report received from Suzie Anderson,CRNA.  CBG on admission 127.  Pt has existing left upper leg fistula placed prior to today--positive for bruit and thrill with strong pedal and posterior tib. Pulses and brisk cap. Refill to left foot.

## 2011-10-08 NOTE — Addendum Note (Signed)
Addendum  created 10/08/11 1140 by Malachi Pro   Modules edited:Anesthesia LDA

## 2011-10-08 NOTE — Anesthesia Procedure Notes (Signed)
Procedure Name: LMA Insertion Date/Time: 10/08/2011 8:24 AM Performed by: Malachi Pro Pre-anesthesia Checklist: Patient identified, Timeout performed, Emergency Drugs available, Suction available and Patient being monitored Patient Re-evaluated:Patient Re-evaluated prior to inductionOxygen Delivery Method: Circle System Utilized Preoxygenation: Pre-oxygenation with 100% oxygen Intubation Type: IV induction LMA: LMA with gastric port inserted LMA Size: 4.0

## 2011-10-08 NOTE — Anesthesia Postprocedure Evaluation (Signed)
  Anesthesia Post-op Note  Patient: Colleen Mullins  Procedure(s) Performed:  REMOVAL OF ARTERIOVENOUS GORETEX GRAFT (AVGG)  Patient Location: PACU  Anesthesia Type: General  Level of Consciousness: awake  Airway and Oxygen Therapy: Patient Spontanous Breathing  Post-op Pain: mild  Post-op Assessment: Post-op Vital signs reviewed  Post-op Vital Signs: stable  Complications: No apparent anesthesia complications

## 2011-10-08 NOTE — Progress Notes (Signed)
Pt rests very comfortably when left undisturbed; when stimulated, patient states that her pain is a "10" but will very quickly fall back to sleep.  Pt has received po medication and is meeting discharge criteria and is ready to transfer to post op ssc for discharge instructions to home.

## 2011-10-08 NOTE — Op Note (Signed)
Procedure: Removal of left arm AV Graft Preop/Postop diagnosis:Pain in graft Anesthesia: General Asst:Samantha Ellington, PAC  Findings: poorly incorporated left arm graft, non functional  Specimens: culture of graft   Procedure details: After obtaining informed consent, the patient was taken to the operating room.  After induction of general anesthesia, the entire left upper extremity was prepped and draped in usual sterile fashion. A transverse incision was made through a pre-existing scar in the antecubital crease. The incision was carried into the subcutaneous tissues down to level the graft. The arterial and venous limbs of the graft were dissected free circumferentially. These were mobilized as completely as possible down to level of the arterial anastomosis.  The arterial limb the graft and dissected free circumferentially and doubly ligated proximally and then divided. I then proceeded to completely mobilize the graft with cautery. An additional transverse incision was made in the distal forearm to remove all the graft.  It was poorly incorporated. There was some edema fluid surrounding the graft. There was no frank pus. Cultures were taken from the graft. After the graft been totally removed leaving a cuff on the arterial and venous anastomosis.  All wounds were thoroughly irrigated with normal saline solution. Subcutaneous tissues of the antecubital and distal forearm incision closed with a running 3-0 Vicryl suture. The skin of all incisions was closed with a running 4 0 vicryl subcuticular stitch.  The pt had audible radial doppler at the end of the case.The patient tolerated procedure well and there were no complications. Instrument sponge and needle counts correct in the case. The patient was taken to the recovery room in stable condition.    Fabienne Bruns, MD  [facility] [place]  Office: [phone]  Pager: [phone]

## 2011-10-08 NOTE — Anesthesia Preprocedure Evaluation (Addendum)
Anesthesia Evaluation  Patient identified by MRN, date of birth, ID band Patient awake    Reviewed: Allergy & Precautions, H&P , NPO status , Patient's Chart, lab work & pertinent test results, reviewed documented beta blocker date and time   History of Anesthesia Complications (+) DIFFICULT IV STICK / SPECIAL LINE  Airway Mallampati: II TM Distance: >3 FB Neck ROM: Full    Dental  (+) Edentulous Upper and Dental Advisory Given   Pulmonary former smoker         Cardiovascular METShypertension, Pt. on home beta blockers     Neuro/Psych    GI/Hepatic GERD-  Medicated,  Endo/Other  Diabetes mellitus-, Insulin DependentMorbid obesity  Renal/GU ESRF and DialysisRenal disease     Musculoskeletal   Abdominal   Peds  Hematology   Anesthesia Other Findings History of bells palsy patient states speech is affected  Reproductive/Obstetrics                         Anesthesia Physical Anesthesia Plan  ASA: III  Anesthesia Plan: General   Post-op Pain Management:    Induction: Intravenous  Airway Management Planned: LMA  Additional Equipment:   Intra-op Plan:   Post-operative Plan: Extubation in OR  Informed Consent: I have reviewed the patients History and Physical, chart, labs and discussed the procedure including the risks, benefits and alternatives for the proposed anesthesia with the patient or authorized representative who has indicated his/her understanding and acceptance.   Dental advisory given  Plan Discussed with:   Anesthesia Plan Comments:         Anesthesia Quick Evaluation

## 2011-10-09 LAB — POCT I-STAT 4, (NA,K, GLUC, HGB,HCT)
Glucose, Bld: 145 mg/dL — ABNORMAL HIGH (ref 70–99)
HCT: 34 % — ABNORMAL LOW (ref 36.0–46.0)
Hemoglobin: 11.6 g/dL — ABNORMAL LOW (ref 12.0–15.0)
Potassium: 3.9 meq/L (ref 3.5–5.1)
Sodium: 137 meq/L (ref 135–145)

## 2011-10-09 LAB — GLUCOSE, CAPILLARY: Glucose-Capillary: 146 mg/dL — ABNORMAL HIGH (ref 70–99)

## 2011-10-09 MED FILL — Mupirocin Oint 2%: CUTANEOUS | Qty: 22 | Status: AC

## 2011-10-10 ENCOUNTER — Encounter (HOSPITAL_COMMUNITY): Payer: Self-pay | Admitting: Vascular Surgery

## 2011-10-10 LAB — WOUND CULTURE

## 2011-10-25 ENCOUNTER — Ambulatory Visit: Payer: Managed Care, Other (non HMO)

## 2011-10-30 ENCOUNTER — Encounter: Payer: Self-pay | Admitting: Physician Assistant

## 2011-10-31 ENCOUNTER — Ambulatory Visit (INDEPENDENT_AMBULATORY_CARE_PROVIDER_SITE_OTHER): Payer: Managed Care, Other (non HMO) | Admitting: Physician Assistant

## 2011-10-31 ENCOUNTER — Encounter: Payer: Self-pay | Admitting: Physician Assistant

## 2011-10-31 VITALS — BP 140/55 | HR 77 | Ht 66.0 in | Wt 185.0 lb

## 2011-10-31 DIAGNOSIS — N186 End stage renal disease: Secondary | ICD-10-CM

## 2011-10-31 NOTE — Progress Notes (Signed)
[facility] [place] Postoperative Visit hemodialysis access   Date of Surgery: 10/08/11 Surgeon: Darrick Penna HD:  yes HD:  Days:  M/W/F via left thigh graft.  CC: f/u for removal of AVG  HPI:  This is a 65 y.o. female who presented complaining of pain and a left forearm AV graft. The graft was originally placed by Dr. Meredeth Ide and [facility]. She states that it has been in place for some time and was never functional. She denies any fever or chills. There is been no drainage from the graft. The pain is primarily at the apex of the graft.  On 10/08/11, she had the graft removed by Dr. Darrick Penna.  She returns today for f/u.    PHYSICAL EXAMINATION:  Filed Vitals:   10/31/11 1354  BP: 140/55  Pulse: 77      Incision is c/d/i with spitting vicryl.  Incisions are healing nicely. Hand grip is good and sensation in digits is intact; Pulse:  2+ left radial pulse  Culture from OR: Gram stain:  No WBCs and no organisms Culture:  No Growth x 2 days.Marland KitchenMarland KitchenFinal  ASSESSMENT/PLAN:  Colleen Mullins is a 65 y.o. year old female who presents s/p removal of left FA AVG on 10/08/11 by Dr. Darrick Penna.  -doing well.  Spitting vicryl stitch cut.  -she continues to HD with a thigh graft.  -RTC PRN  Newton Pigg, PA-C [facility] [phone]  Clinic MD:   Darrick Penna

## 2012-05-04 ENCOUNTER — Other Ambulatory Visit: Payer: Self-pay | Admitting: Nephrology

## 2012-05-04 ENCOUNTER — Ambulatory Visit
Admission: RE | Admit: 2012-05-04 | Discharge: 2012-05-04 | Disposition: A | Discharge status: Home / Self Care | Payer: Medicare HMO | Source: Ambulatory Visit | Attending: Nephrology | Admitting: Nephrology

## 2012-05-04 DIAGNOSIS — R7611 Nonspecific reaction to tuberculin skin test without active tuberculosis: Secondary | ICD-10-CM

## 2012-08-01 ENCOUNTER — Emergency Department (HOSPITAL_COMMUNITY)
Admission: EM | Admit: 2012-08-01 | Discharge: 2012-08-01 | Disposition: A | Discharge status: Home / Self Care | Payer: Medicare HMO | Attending: Emergency Medicine | Admitting: Emergency Medicine

## 2012-08-01 ENCOUNTER — Encounter (HOSPITAL_COMMUNITY): Payer: Self-pay

## 2012-08-01 DIAGNOSIS — Z833 Family history of diabetes mellitus: Secondary | ICD-10-CM | POA: Insufficient documentation

## 2012-08-01 DIAGNOSIS — Z8249 Family history of ischemic heart disease and other diseases of the circulatory system: Secondary | ICD-10-CM | POA: Insufficient documentation

## 2012-08-01 DIAGNOSIS — Z8489 Family history of other specified conditions: Secondary | ICD-10-CM | POA: Insufficient documentation

## 2012-08-01 DIAGNOSIS — L0291 Cutaneous abscess, unspecified: Secondary | ICD-10-CM

## 2012-08-01 DIAGNOSIS — Z87891 Personal history of nicotine dependence: Secondary | ICD-10-CM | POA: Insufficient documentation

## 2012-08-01 DIAGNOSIS — Z794 Long term (current) use of insulin: Secondary | ICD-10-CM | POA: Insufficient documentation

## 2012-08-01 DIAGNOSIS — E119 Type 2 diabetes mellitus without complications: Secondary | ICD-10-CM | POA: Insufficient documentation

## 2012-08-01 DIAGNOSIS — Z09 Encounter for follow-up examination after completed treatment for conditions other than malignant neoplasm: Secondary | ICD-10-CM | POA: Insufficient documentation

## 2012-08-01 DIAGNOSIS — I129 Hypertensive chronic kidney disease with stage 1 through stage 4 chronic kidney disease, or unspecified chronic kidney disease: Secondary | ICD-10-CM | POA: Insufficient documentation

## 2012-08-01 DIAGNOSIS — K219 Gastro-esophageal reflux disease without esophagitis: Secondary | ICD-10-CM | POA: Insufficient documentation

## 2012-08-01 DIAGNOSIS — N189 Chronic kidney disease, unspecified: Secondary | ICD-10-CM | POA: Insufficient documentation

## 2012-08-01 MED ORDER — HYDROCODONE-ACETAMINOPHEN 5-500 MG PO TABS: 1.0000 | ORAL_TABLET | Freq: Four times a day (QID) | ORAL | Status: AC | PRN

## 2012-08-01 MED ORDER — HYDROCODONE-ACETAMINOPHEN 5-325 MG PO TABS
1.0000 | ORAL_TABLET | Freq: Once | ORAL | Status: AC
  Administered 2012-08-01: 1 via ORAL
  Filled 2012-08-01: qty 1

## 2012-08-01 NOTE — ED Notes (Signed)
Had cyst i and d on Thursday at [facility] and sts continues to bleed, foul odor ntoed and unable to find sutures.

## 2012-08-01 NOTE — ED Provider Notes (Signed)
History   This chart was scribed for No att. providers found by Toya Smothers. The patient was seen in room TR09C/TR09C. Patient's care was started at 1229.  CSN: [id number]  Arrival date & time 08/01/12  1229   None     Chief Complaint  Patient presents with  . Follow-up   The history is provided by the patient. No language interpreter was used.     Colleen Mullins is a 66 y.o. female who presents to the Emergency Department for a follow up after removal of a cyst within the left axilla because of 3 days of bloody drainage. Pt endorses associate foul odor and that she was unable to find sutures. Pain is gradually decreasing.Pt denies fever, chills, emesis, nausea, rash, and cough.   Past Medical History  Diagnosis Date  . Diabetes mellitus   . Hypertension   . Hyperlipidemia   . GERD (gastroesophageal reflux disease)   . Arthritis   . H/O: Bell's palsy   . Chronic kidney disease   . Dialysis patient     M-W-F @ [facility]    Past Surgical History  Procedure Date  . Tonsillectomy   . Btl   . Arteriovenous graft placement     Left forearm x 2, one removed in March  . Arteriovenous graft placement     Left forearm  . Laparotomy     Pelvic mass (Benign)  . Avgg removal 10/08/2011    Procedure: REMOVAL OF ARTERIOVENOUS GORETEX GRAFT (AVGG);  Surgeon: Sherren Kerns, MD;  Location: [facility] OR;  Service: Vascular;  Laterality: Left;    Family History  Problem Relation Age of Onset  . Diabetes Mother   . Heart disease Mother   . COPD Father   . Hypertension Father     History  Substance Use Topics  . Smoking status: Former Smoker    Types: Cigarettes    Quit date: 08/04/1999  . Smokeless tobacco: Never Used  . Alcohol Use: No    Review of Systems  Skin: Positive for wound.  All other systems reviewed and are negative.    Allergies  Review of patient's allergies indicates no known allergies.  Home Medications   Current Outpatient Rx  Name Route Sig Dispense  Refill  . AMLODIPINE BESYLATE 10 MG PO TABS Oral Take 10 mg by mouth daily.      . ASPIRIN EC 81 MG PO TBEC Oral Take 81 mg by mouth daily.      Marland Kitchen CALCIUM ACETATE (PHOS BINDER) 667 MG/5ML PO SOLN Oral Take 667 mg by mouth 3 (three) times daily with meals. Any time she eats food, including snacks     . ESOMEPRAZOLE MAGNESIUM 40 MG PO CPDR Oral Take 40 mg by mouth daily before breakfast.      . ETHYL CHLORIDE EX AERO Topical Apply topically as needed. Apply to area before injections    . FENOFIBRATE 145 MG PO TABS Oral Take 145 mg by mouth daily.      . OMEGA-3 FATTY ACIDS 1000 MG PO CAPS Oral Take 1,200 mg by mouth at bedtime.      Marland Kitchen HYDROCODONE-ACETAMINOPHEN 5-500 MG PO TABS Oral Take 1 tablet by mouth every 6 (six) hours as needed. For pain    . INSULIN ASPART PROT & ASPART (70-30) 100 UNIT/ML [place] SUSP Subcutaneous Inject 1-5 Units into the skin 3 (three) times daily as needed. Per sliding scale    . INSULIN GLARGINE 100 UNIT/ML [place] SOLN Subcutaneous Inject 10-30 Units  into the skin 2 (two) times daily. 10 units in the morning 30 units at bedtime    . LABETALOL HCL 300 MG PO TABS Oral Take 300 mg by mouth 2 (two) times daily.      Marland Kitchen RENA-VITE PO TABS Oral Take 1 tablet by mouth daily.      Marland Kitchen NEPRO PO Oral Take 237 mLs by mouth 2 (two) times daily.     . TRAMADOL HCL 50 MG PO TABS Oral Take 50 mg by mouth 2 (two) times daily as needed. For pain    . TRAVOPROST 0.004 % OP SOLN Both Eyes Place 1 drop into both eyes at bedtime.       BP 151/70  Pulse 82  Temp 98.8 F (37.1 C) (Oral)  Resp 20  SpO2 98%  Physical Exam  Nursing note and vitals reviewed. Constitutional: She appears well-developed and well-nourished. No distress.  HENT:  Head: Normocephalic and atraumatic.  Right Ear: External ear normal.  Left Ear: External ear normal.  Eyes: Conjunctivae are normal. Right eye exhibits no discharge. Left eye exhibits no discharge. No scleral icterus.  Neck: Neck supple. No tracheal deviation  present.  Cardiovascular: Normal rate.   Pulmonary/Chest: Effort normal. No stridor. No respiratory distress.  Musculoskeletal: She exhibits no edema.  Neurological: She is alert. Cranial nerve deficit: no gross deficits.  Skin: Skin is warm and dry. No rash noted.       1 cm incision under the right axilla. It is tender to palpation. No erythema. Minimal drainage. No fluctuance  Psychiatric: She has a normal mood and affect.    ED Course  Procedures (including critical care time) DIAGNOSTIC STUDIES: Oxygen Saturation is 98% on room air, normal by my interpretation.    COORDINATION OF CARE: 13:   Labs Reviewed - No data to display No results found.   No diagnosis found.    MDM  Abscess looks fine, no fluctuance palpable.  She is tender so will prescribe pain medications.  To return prn if worsens.        I personally performed the services described in this documentation, which was scribed in my presence. The recorded information has been reviewed and considered.      Geoffery Lyons, MD 08/01/12 [phone]

## 2012-12-11 ENCOUNTER — Emergency Department (HOSPITAL_COMMUNITY): Payer: Medicare HMO

## 2012-12-11 ENCOUNTER — Encounter (HOSPITAL_COMMUNITY): Payer: Self-pay | Admitting: *Deleted

## 2012-12-11 ENCOUNTER — Emergency Department (HOSPITAL_COMMUNITY)
Admission: EM | Admit: 2012-12-11 | Discharge: 2012-12-11 | Disposition: A | Discharge status: Home / Self Care | Payer: Medicare HMO | Attending: Emergency Medicine | Admitting: Emergency Medicine

## 2012-12-11 DIAGNOSIS — Z8669 Personal history of other diseases of the nervous system and sense organs: Secondary | ICD-10-CM | POA: Insufficient documentation

## 2012-12-11 DIAGNOSIS — Z8739 Personal history of other diseases of the musculoskeletal system and connective tissue: Secondary | ICD-10-CM | POA: Insufficient documentation

## 2012-12-11 DIAGNOSIS — K219 Gastro-esophageal reflux disease without esophagitis: Secondary | ICD-10-CM | POA: Insufficient documentation

## 2012-12-11 DIAGNOSIS — Z794 Long term (current) use of insulin: Secondary | ICD-10-CM | POA: Insufficient documentation

## 2012-12-11 DIAGNOSIS — Z7982 Long term (current) use of aspirin: Secondary | ICD-10-CM | POA: Insufficient documentation

## 2012-12-11 DIAGNOSIS — E1169 Type 2 diabetes mellitus with other specified complication: Secondary | ICD-10-CM | POA: Insufficient documentation

## 2012-12-11 DIAGNOSIS — N189 Chronic kidney disease, unspecified: Secondary | ICD-10-CM | POA: Insufficient documentation

## 2012-12-11 DIAGNOSIS — Z79899 Other long term (current) drug therapy: Secondary | ICD-10-CM | POA: Insufficient documentation

## 2012-12-11 DIAGNOSIS — Z992 Dependence on renal dialysis: Secondary | ICD-10-CM | POA: Insufficient documentation

## 2012-12-11 DIAGNOSIS — E162 Hypoglycemia, unspecified: Secondary | ICD-10-CM

## 2012-12-11 DIAGNOSIS — I129 Hypertensive chronic kidney disease with stage 1 through stage 4 chronic kidney disease, or unspecified chronic kidney disease: Secondary | ICD-10-CM | POA: Insufficient documentation

## 2012-12-11 LAB — CBC WITH DIFFERENTIAL/PLATELET
Basophils Relative: 0 % (ref 0–1)
HCT: 40 % (ref 36.0–46.0)
Hemoglobin: 12.9 g/dL (ref 12.0–15.0)
MCHC: 32.3 g/dL (ref 30.0–36.0)
Monocytes Absolute: 0.3 10*3/uL (ref 0.1–1.0)
Monocytes Relative: 4 % (ref 3–12)
Neutro Abs: 4.7 10*3/uL (ref 1.7–7.7)

## 2012-12-11 LAB — POCT I-STAT, CHEM 8
BUN: 58 mg/dL — ABNORMAL HIGH (ref 6–23)
Chloride: 98 mEq/L (ref 96–112)
Potassium: 4 mEq/L (ref 3.5–5.1)
Sodium: 137 mEq/L (ref 135–145)

## 2012-12-11 LAB — GLUCOSE, CAPILLARY: Glucose-Capillary: 99 mg/dL (ref 70–99)

## 2012-12-11 LAB — URINE MICROSCOPIC-ADD ON

## 2012-12-11 LAB — URINALYSIS, ROUTINE W REFLEX MICROSCOPIC
Bilirubin Urine: NEGATIVE
Specific Gravity, Urine: 1.008 (ref 1.005–1.030)
Urobilinogen, UA: 0.2 mg/dL (ref 0.0–1.0)
pH: 8 (ref 5.0–8.0)

## 2012-12-11 MED ORDER — DEXTROSE 50 % IV SOLN: 0.5000 | Freq: Once | INTRAVENOUS | Status: DC

## 2012-12-11 NOTE — ED Notes (Signed)
Unable to obtain IV access, paged IV team.  

## 2012-12-11 NOTE — ED Provider Notes (Signed)
History     CSN: [id number]  Arrival date & time 12/11/12  6213   First MD Initiated Contact with Patient 12/11/12 0602      Chief Complaint  Patient presents with  . Hypoglycemia    (Consider location/radiation/quality/duration/timing/severity/associated sxs/prior treatment) HPI  67 year old female with history of diabetes, end-stage renal disease currently on dialysis (Monday/Wednesday/Friday) present for evaluations of generalized weakness. Patient reports she has no appetite yesterday but went ahead and took her usual diabetic medication, Lantus. This morning she woke up for her dialysis appointment, however she felt weak. At which time she check her blood sugar and it was 41. Her daughter called EMS to bring her to the ER. Patient reports there has been no significant change in her daily activities. She denies fever, chills, chest pain, shortness of breath, productive cough, hemoptysis, abdominal pain, or dysuria. She did feel nauseous this morning after eating a candy bar but felt fine currently. Patient does make urine. Her last dialysis was Wednesday, 2 days ago.  Past Medical History  Diagnosis Date  . Diabetes mellitus   . Hypertension   . Hyperlipidemia   . GERD (gastroesophageal reflux disease)   . Arthritis   . H/O: Bell's palsy   . Chronic kidney disease   . Dialysis patient     M-W-F @ [facility]    Past Surgical History  Procedure Date  . Tonsillectomy   . Btl   . Arteriovenous graft placement     Left forearm x 2, one removed in March  . Arteriovenous graft placement     Left forearm  . Laparotomy     Pelvic mass (Benign)  . Avgg removal 10/08/2011    Procedure: REMOVAL OF ARTERIOVENOUS GORETEX GRAFT (AVGG);  Surgeon: Sherren Kerns, MD;  Location: [facility] OR;  Service: Vascular;  Laterality: Left;    Family History  Problem Relation Age of Onset  . Diabetes Mother   . Heart disease Mother   . COPD Father   . Hypertension Father     History    Substance Use Topics  . Smoking status: Former Smoker    Types: Cigarettes    Quit date: 08/04/1999  . Smokeless tobacco: Never Used  . Alcohol Use: No    OB History    Grav Para Term Preterm Abortions TAB SAB Ect Mult Living                  Review of Systems  Constitutional:       10 Systems reviewed and all are negative for acute change except as noted in the HPI.     Allergies  Review of patient's allergies indicates no known allergies.  Home Medications   Current Outpatient Rx  Name  Route  Sig  Dispense  Refill  . AMLODIPINE BESYLATE 10 MG PO TABS   Oral   Take 10 mg by mouth daily.           . ASPIRIN EC 81 MG PO TBEC   Oral   Take 81 mg by mouth daily.           Marland Kitchen CALCIUM ACETATE (PHOS BINDER) 667 MG/5ML PO SOLN   Oral   Take 667 mg by mouth 3 (three) times daily with meals. Any time she eats food, including snacks          . ESOMEPRAZOLE MAGNESIUM 40 MG PO CPDR   Oral   Take 40 mg by mouth daily before breakfast.           .  ETHYL CHLORIDE EX AERO   Topical   Apply topically as needed. Apply to area before injections         . FENOFIBRATE 145 MG PO TABS   Oral   Take 145 mg by mouth daily.           . OMEGA-3 FATTY ACIDS 1000 MG PO CAPS   Oral   Take 1,200 mg by mouth at bedtime.           Marland Kitchen HYDROCODONE-ACETAMINOPHEN 5-500 MG PO TABS   Oral   Take 1 tablet by mouth every 6 (six) hours as needed. For pain         . INSULIN ASPART PROT & ASPART (70-30) 100 UNIT/ML [place] SUSP   Subcutaneous   Inject 1-5 Units into the skin 3 (three) times daily as needed. Per sliding scale         . INSULIN GLARGINE 100 UNIT/ML  SOLN   Subcutaneous   Inject 10-30 Units into the skin 2 (two) times daily. 10 units in the morning 30 units at bedtime         . LABETALOL HCL 300 MG PO TABS   Oral   Take 300 mg by mouth 2 (two) times daily.           Marland Kitchen RENA-VITE PO TABS   Oral   Take 1 tablet by mouth daily.           Marland Kitchen NEPRO PO    Oral   Take 237 mLs by mouth 2 (two) times daily.          . TRAMADOL HCL 50 MG PO TABS   Oral   Take 50 mg by mouth 2 (two) times daily as needed. For pain         . TRAVOPROST 0.004 % OP SOLN   Both Eyes   Place 1 drop into both eyes at bedtime.            There were no vitals taken for this visit.  Physical Exam  Nursing note and vitals reviewed. Constitutional: She is oriented to person, place, and time. She appears well-developed and well-nourished. No distress.       Awake, alert, nontoxic appearance  HENT:  Head: Atraumatic.  Eyes: Conjunctivae normal are normal. Right eye exhibits no discharge. Left eye exhibits no discharge.  Neck: Neck supple.  Cardiovascular: Normal rate and regular rhythm.   Pulmonary/Chest: Effort normal. No respiratory distress. She exhibits no tenderness.  Abdominal: Soft. There is no tenderness. There is no rebound.  Musculoskeletal: She exhibits no tenderness.       ROM appears intact, no obvious focal weakness  Neurological: She is alert and oriented to person, place, and time. She has normal strength. No cranial nerve deficit or sensory deficit. GCS eye subscore is 4. GCS verbal subscore is 5. GCS motor subscore is 6.       Mental status and motor strength appears intact  Walks with a cane.  Skin: No rash noted.  Psychiatric: She has a normal mood and affect.    ED Course  Procedures (including critical care time)  Labs Reviewed  GLUCOSE, CAPILLARY - Abnormal; Notable for the following:    Glucose-Capillary 67 (*)     All other components within normal limits  POCT I-STAT, CHEM 8 - Abnormal; Notable for the following:    BUN 58 (*)     Creatinine, Ser 7.50 (*)     Glucose, Bld 54 (*)  All other components within normal limits  CBC WITH DIFFERENTIAL  URINALYSIS, ROUTINE W REFLEX MICROSCOPIC   Results for orders placed during the hospital encounter of 12/11/12  CBC WITH DIFFERENTIAL      Component Value Range   WBC 7.2   4.0 - 10.5 K/uL   RBC 4.36  3.87 - 5.11 MIL/uL   Hemoglobin 12.9  12.0 - 15.0 g/dL   HCT 16.1  09.6 - 04.5 %   MCV 91.7  78.0 - 100.0 fL   MCH 29.6  26.0 - 34.0 pg   MCHC 32.3  30.0 - 36.0 g/dL   RDW 40.9  81.1 - 91.4 %   Platelets 273  150 - 400 K/uL   Neutrophils Relative 65  43 - 77 %   Neutro Abs 4.7  1.7 - 7.7 K/uL   Lymphocytes Relative 30  12 - 46 %   Lymphs Abs 2.1  0.7 - 4.0 K/uL   Monocytes Relative 4  3 - 12 %   Monocytes Absolute 0.3  0.1 - 1.0 K/uL   Eosinophils Relative 1  0 - 5 %   Eosinophils Absolute 0.1  0.0 - 0.7 K/uL   Basophils Relative 0  0 - 1 %   Basophils Absolute 0.0  0.0 - 0.1 K/uL  GLUCOSE, CAPILLARY      Component Value Range   Glucose-Capillary 67 (*) 70 - 99 mg/dL   Comment 1 Varady,Charene    POCT I-STAT, CHEM 8      Component Value Range   Sodium 137  135 - 145 mEq/L   Potassium 4.0  3.5 - 5.1 mEq/L   Chloride 98  96 - 112 mEq/L   BUN 58 (*) 6 - 23 mg/dL   Creatinine, Ser 7.82 (*) 0.50 - 1.10 mg/dL   Glucose, Bld 54 (*) 70 - 99 mg/dL   Calcium, Ion 9.56  2.13 - 1.30 mmol/L   TCO2 33  0 - 100 mmol/L   Hemoglobin 14.3  12.0 - 15.0 g/dL   HCT 08.6  57.8 - 46.9 %   Dg Chest 2 View  12/11/2012  *RADIOLOGY REPORT*  Clinical Data: The dialysis patient.  Weakness.  CHEST - 2 VIEW  Comparison: 05/04/2012  Findings: Mild cardiac enlargement.  The pulmonary vascularity is normal.  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  No focal consolidation or edema.  No blunting of costophrenic angles.  No pneumothorax. Stable appearance since previous study.  IMPRESSION: Cardiac enlargement.  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  No evidence of active disease.   Original Report Authenticated By: Burman Nieves, M.D.    1. Hypoglycemia 2. Dialysis patient   MDM  Diabetic pt presents for hypoglycemia as pt missed her dinner last night.  Pt is currently mentating well, no focal neuro deficits, no obvious source of infection.  Able to make urine.  Initiall CBG is 67, current  CBG 54.  Pt does not have any appetite and does not want to eat.  Will give 0.5 amp of D50.  Will obtain UA, CXR, basic labs and will call for admission for further management and for dialysis.  Care discussed with attending.    8:03 AM Unable to establish IV access after multiple attempts.  Pt request no IV access.  Pt has been able to eat ([place] sandwich, apple juice, crackers) and her recheck CBG is 99.  Pt currently in NAD.  Will consult nephrology for dialysis.  Pt has normal K+.  8:38 AM I have  consulted with nephrologist, Dr. Allena Katz, who will schedule f/u appointment today for dialysis.    Pt is scheduled for dialysis at 11am today at [facility].    BP 162/59  Pulse 68  SpO2 100%  I have reviewed nursing notes and vital signs. I personally reviewed the imaging tests through PACS system  I reviewed available ER/hospitalization records thought the EMR     Fayrene Helper, [place] 12/11/12 4098

## 2012-12-11 NOTE — ED Notes (Signed)
Pt eating, paged iv team

## 2012-12-11 NOTE — ED Notes (Signed)
Gave the pt apple juice, graham crackers, and peanut butter.

## 2012-12-11 NOTE — ED Notes (Signed)
Pt refusing to have IV after IV team missed, Laveda Norman PA made aware

## 2012-12-11 NOTE — ED Notes (Signed)
Per EMS:  Pt checked her CBG about 3am, it was 41.  Pt had a candy bar and a soda and it came up to around 75, pt st's she hasn't been able to eat like she normally does b/c she doesn't feel good, pt st's she took her normal dose of lantus last night.  Pt was supposed to go to dialysis at 0500 but didn't make it.  Resp even and unlabored.  A&O x4.

## 2012-12-11 NOTE — ED Notes (Signed)
Patient transported to X-ray 

## 2012-12-18 NOTE — ED Provider Notes (Signed)
Medical screening examination/treatment/procedure(s) were conducted as a shared visit with non-physician practitioner(s) and myself.  I personally evaluated the patient during the encounter   RRR CTAB NABS AO  Awaiting labs  Darlean Warmoth K Anett Ranker-Rasch, MD 12/18/12 2302

## 2013-02-12 ENCOUNTER — Other Ambulatory Visit: Payer: Self-pay | Admitting: Internal Medicine

## 2013-04-20 ENCOUNTER — Other Ambulatory Visit (HOSPITAL_COMMUNITY): Payer: Self-pay | Admitting: Nephrology

## 2013-04-20 DIAGNOSIS — N186 End stage renal disease: Secondary | ICD-10-CM

## 2013-04-22 ENCOUNTER — Ambulatory Visit (HOSPITAL_COMMUNITY): Payer: Medicare HMO | Attending: Nephrology | Admitting: Radiology

## 2013-04-22 DIAGNOSIS — N179 Acute kidney failure, unspecified: Secondary | ICD-10-CM

## 2013-04-22 DIAGNOSIS — N186 End stage renal disease: Secondary | ICD-10-CM | POA: Insufficient documentation

## 2013-04-22 DIAGNOSIS — I059 Rheumatic mitral valve disease, unspecified: Secondary | ICD-10-CM | POA: Insufficient documentation

## 2013-04-22 DIAGNOSIS — I319 Disease of pericardium, unspecified: Secondary | ICD-10-CM | POA: Insufficient documentation

## 2013-04-22 DIAGNOSIS — Z01810 Encounter for preprocedural cardiovascular examination: Secondary | ICD-10-CM | POA: Insufficient documentation

## 2013-04-22 DIAGNOSIS — Z992 Dependence on renal dialysis: Secondary | ICD-10-CM | POA: Insufficient documentation

## 2013-04-22 NOTE — Progress Notes (Signed)
Limited Echocardiogram performed. Spoke with Janeece Agee, RN MSN at [facility] [phone] and Burna Mortimer Deal ok to do limited study to evaluate LVOT. Patient had full study done at [facility] on 03/11/2013.

## 2013-04-27 ENCOUNTER — Ambulatory Visit (HOSPITAL_COMMUNITY): Payer: Medicare HMO | Attending: Cardiology | Admitting: Radiology

## 2013-04-27 VITALS — BP 168/73 | Ht 66.0 in | Wt 186.0 lb

## 2013-04-27 DIAGNOSIS — Z8249 Family history of ischemic heart disease and other diseases of the circulatory system: Secondary | ICD-10-CM | POA: Insufficient documentation

## 2013-04-27 DIAGNOSIS — Z01810 Encounter for preprocedural cardiovascular examination: Secondary | ICD-10-CM | POA: Insufficient documentation

## 2013-04-27 DIAGNOSIS — Z87891 Personal history of nicotine dependence: Secondary | ICD-10-CM | POA: Insufficient documentation

## 2013-04-27 DIAGNOSIS — I319 Disease of pericardium, unspecified: Secondary | ICD-10-CM | POA: Insufficient documentation

## 2013-04-27 DIAGNOSIS — R079 Chest pain, unspecified: Secondary | ICD-10-CM

## 2013-04-27 DIAGNOSIS — R5381 Other malaise: Secondary | ICD-10-CM | POA: Insufficient documentation

## 2013-04-27 DIAGNOSIS — Z01818 Encounter for other preprocedural examination: Secondary | ICD-10-CM

## 2013-04-27 DIAGNOSIS — R5383 Other fatigue: Secondary | ICD-10-CM | POA: Insufficient documentation

## 2013-04-27 DIAGNOSIS — E119 Type 2 diabetes mellitus without complications: Secondary | ICD-10-CM | POA: Insufficient documentation

## 2013-04-27 DIAGNOSIS — R002 Palpitations: Secondary | ICD-10-CM | POA: Insufficient documentation

## 2013-04-27 DIAGNOSIS — Z8673 Personal history of transient ischemic attack (TIA), and cerebral infarction without residual deficits: Secondary | ICD-10-CM | POA: Insufficient documentation

## 2013-04-27 DIAGNOSIS — N189 Chronic kidney disease, unspecified: Secondary | ICD-10-CM | POA: Insufficient documentation

## 2013-04-27 DIAGNOSIS — Z794 Long term (current) use of insulin: Secondary | ICD-10-CM | POA: Insufficient documentation

## 2013-04-27 DIAGNOSIS — R110 Nausea: Secondary | ICD-10-CM | POA: Insufficient documentation

## 2013-04-27 DIAGNOSIS — E785 Hyperlipidemia, unspecified: Secondary | ICD-10-CM | POA: Insufficient documentation

## 2013-04-27 DIAGNOSIS — I129 Hypertensive chronic kidney disease with stage 1 through stage 4 chronic kidney disease, or unspecified chronic kidney disease: Secondary | ICD-10-CM | POA: Insufficient documentation

## 2013-04-27 DIAGNOSIS — I471 Supraventricular tachycardia: Secondary | ICD-10-CM

## 2013-04-27 MED ORDER — TECHNETIUM TC 99M SESTAMIBI GENERIC - CARDIOLITE
33.0000 | Freq: Once | INTRAVENOUS | Status: AC | PRN
  Administered 2013-04-27: 33 via INTRAVENOUS

## 2013-04-27 MED ORDER — TECHNETIUM TC 99M SESTAMIBI GENERIC - CARDIOLITE
10.9000 | Freq: Once | INTRAVENOUS | Status: AC | PRN
  Administered 2013-04-27: 10.9 via INTRAVENOUS

## 2013-04-27 MED ORDER — REGADENOSON 0.4 MG/5ML IV SOLN
0.4000 mg | Freq: Once | INTRAVENOUS | Status: AC
  Administered 2013-04-27: 0.4 mg via INTRAVENOUS

## 2013-04-27 NOTE — Progress Notes (Signed)
[facility] SITE 3 NUCLEAR MED [address] [place], [place] [address] [phone]    Cardiology Nuclear Med Study  Colleen Mullins is a 67 y.o. female     MRN : [id number]     DOB: October 04, 1946  Procedure Date: 04/27/2013  Nuclear Med Background Indication for Stress Test:  Evaluation for Ischemia and Surgical Clearance: Pending Kidney Transplant at [facility] with Dr. Pablo Lawrence History:  2013 Nl per pt @ [facility], 04/22/13 ECHO: EF: 65-70% severe LVH, small pericardial effusion, Chronic renal failure Cardiac Risk Factors: CVA, Family History - CAD, History of Smoking, Hypertension, IDDM Type 2 and Lipids  Symptoms:  Fatigue, Nausea and Palpitations   Nuclear Pre-Procedure Caffeine/Decaff Intake:  None NPO After: 6:30am Patient is diabetic,CBG this am 132 at 0630. Patient had pc of toast and 1/2 glass of juice.  Lungs:  clear O2 Sat: 98% on room air. IV 0.9% NS with Angio Cath:  22g  IV Site: R upper Forearm x 1, tolerated well IV Started by:  Irean Hong, RN  Chest Size (in):  42 Cup Size: D  Height: 5\' 6"  (1.676 m)  Weight:  186 lb (84.369 kg)  BMI:  Body mass index is 30.04 kg/(m^2). Tech Comments:  Labetolol held x 60 hrs. Patient instructed per [facility] to hold x 48hrs    Nuclear Med Study 1 or 2 day study: 1 day  Stress Test Type:  Eugenie Birks  Reading MD: Willa Rough, MD  Order Authorizing Provider:  Barth Kirks  Resting Radionuclide: Technetium 24m Sestamibi  Resting Radionuclide Dose: 10.9 mCi   Stress Radionuclide:  Technetium 26m Sestamibi  Stress Radionuclide Dose: 33.0 mCi           Stress Protocol Rest HR: 70 Stress HR: 98  Rest BP: 168/73 Stress BP: 175/74  Exercise Time (min): n/a METS: n/a   Predicted Max HR: 152 bpm % Max HR: 64.47 bpm Rate Pressure Product: 78469   Dose of Adenosine (mg):  n/a Dose of Lexiscan: 0.4 mg  Dose of Atropine (mg): n/a Dose of Dobutamine: n/a mcg/kg/min (at max HR)  Stress Test Technologist: Milana Na,  EMT-P  Nuclear Technologist:  Domenic Polite, CNMT     Rest Procedure:  Myocardial perfusion imaging was performed at rest 45 minutes following the intravenous administration of Technetium 39m Sestamibi. Rest ECG: Normal sinus rhythm with decreased anterior R-wave progression  Stress Procedure:  The patient received IV Lexiscan 0.4 mg over 15-seconds.  Technetium 59m Sestamibi injected at 30-seconds. This patient had abdominal cramps with the Lexiscan injection. Quantitative spect images were obtained after a 45 minute delay. Stress ECG: No significant change from baseline ECG  QPS Raw Data Images:  Patient motion noted; appropriate software correction applied. Stress Images:  Normal homogeneous uptake in all areas of the myocardium. Rest Images:  Normal homogeneous uptake in all areas of the myocardium. Subtraction (SDS):  No evidence of ischemia. Transient Ischemic Dilatation (Normal <1.22):  1.08 Lung/Heart Ratio (Normal <0.45):  0.25  Quantitative Gated Spect Images QGS EDV:  110 ml QGS ESV:  52 ml  Impression Exercise Capacity:  Lexiscan with no exercise. BP Response:  Normal blood pressure response. Clinical Symptoms:  abdominal cramps ECG Impression:  Before drug was infused, the patient had a 6 beat run of supraventricular tachycardia. It was self-limited. It may have been a short burst of atrial fibrillation. Comparison with Prior Nuclear Study: No images to compare  Overall Impression:  There is no evidence of scar or  ischemia. There are no definite wall motion abnormalities. This is a low risk scan.  LV Ejection Fraction: 53%.  LV Wall Motion:   There are no definite wall motion abnormalities.  Willa Rough, MD

## 2013-06-08 ENCOUNTER — Ambulatory Visit
Admission: RE | Admit: 2013-06-08 | Discharge: 2013-06-08 | Disposition: A | Discharge status: Home / Self Care | Payer: Medicare HMO | Source: Ambulatory Visit | Attending: Nephrology | Admitting: Nephrology

## 2013-06-08 ENCOUNTER — Other Ambulatory Visit: Payer: Self-pay | Admitting: Nephrology

## 2013-06-08 DIAGNOSIS — R7611 Nonspecific reaction to tuberculin skin test without active tuberculosis: Secondary | ICD-10-CM

## 2013-06-08 DIAGNOSIS — Z992 Dependence on renal dialysis: Secondary | ICD-10-CM

## 2013-06-15 ENCOUNTER — Encounter: Payer: Self-pay | Admitting: Cardiology

## 2013-06-15 DIAGNOSIS — R9430 Abnormal result of cardiovascular function study, unspecified: Secondary | ICD-10-CM | POA: Insufficient documentation

## 2015-08-29 ENCOUNTER — Encounter (HOSPITAL_COMMUNITY): Payer: Self-pay | Admitting: *Deleted

## 2015-08-29 ENCOUNTER — Emergency Department (HOSPITAL_COMMUNITY)
Admission: EM | Admit: 2015-08-29 | Discharge: 2015-08-29 | Disposition: A | Discharge status: Home / Self Care | Payer: Medicare HMO | Attending: Emergency Medicine | Admitting: Emergency Medicine

## 2015-08-29 DIAGNOSIS — K219 Gastro-esophageal reflux disease without esophagitis: Secondary | ICD-10-CM | POA: Diagnosis not present

## 2015-08-29 DIAGNOSIS — Y846 Urinary catheterization as the cause of abnormal reaction of the patient, or of later complication, without mention of misadventure at the time of the procedure: Secondary | ICD-10-CM | POA: Diagnosis not present

## 2015-08-29 DIAGNOSIS — E669 Obesity, unspecified: Secondary | ICD-10-CM | POA: Diagnosis not present

## 2015-08-29 DIAGNOSIS — Z794 Long term (current) use of insulin: Secondary | ICD-10-CM | POA: Insufficient documentation

## 2015-08-29 DIAGNOSIS — N186 End stage renal disease: Secondary | ICD-10-CM | POA: Diagnosis not present

## 2015-08-29 DIAGNOSIS — Z992 Dependence on renal dialysis: Secondary | ICD-10-CM | POA: Insufficient documentation

## 2015-08-29 DIAGNOSIS — E785 Hyperlipidemia, unspecified: Secondary | ICD-10-CM | POA: Diagnosis not present

## 2015-08-29 DIAGNOSIS — I120 Hypertensive chronic kidney disease with stage 5 chronic kidney disease or end stage renal disease: Secondary | ICD-10-CM | POA: Diagnosis not present

## 2015-08-29 DIAGNOSIS — Z7982 Long term (current) use of aspirin: Secondary | ICD-10-CM | POA: Diagnosis not present

## 2015-08-29 DIAGNOSIS — Z87891 Personal history of nicotine dependence: Secondary | ICD-10-CM | POA: Diagnosis not present

## 2015-08-29 DIAGNOSIS — Z79899 Other long term (current) drug therapy: Secondary | ICD-10-CM | POA: Insufficient documentation

## 2015-08-29 DIAGNOSIS — T82868A Thrombosis of vascular prosthetic devices, implants and grafts, initial encounter: Secondary | ICD-10-CM | POA: Diagnosis present

## 2015-08-29 LAB — CBC WITH DIFFERENTIAL/PLATELET
Basophils Absolute: 0 10*3/uL (ref 0.0–0.1)
Basophils Relative: 0 %
Eosinophils Absolute: 0.1 10*3/uL (ref 0.0–0.7)
Eosinophils Relative: 2 %
HCT: 32.9 % — ABNORMAL LOW (ref 36.0–46.0)
Hemoglobin: 10.4 g/dL — ABNORMAL LOW (ref 12.0–15.0)
Lymphocytes Relative: 32 %
Lymphs Abs: 2.1 10*3/uL (ref 0.7–4.0)
MCH: 29.2 pg (ref 26.0–34.0)
MCHC: 31.6 g/dL (ref 30.0–36.0)
MCV: 92.4 fL (ref 78.0–100.0)
Monocytes Absolute: 0.3 10*3/uL (ref 0.1–1.0)
Monocytes Relative: 4 %
Neutro Abs: 4.2 10*3/uL (ref 1.7–7.7)
Neutrophils Relative %: 62 %
Platelets: 349 10*3/uL (ref 150–400)
RBC: 3.56 MIL/uL — ABNORMAL LOW (ref 3.87–5.11)
RDW: 17.3 % — ABNORMAL HIGH (ref 11.5–15.5)
WBC: 6.7 10*3/uL (ref 4.0–10.5)

## 2015-08-29 LAB — BASIC METABOLIC PANEL
Anion gap: 13 (ref 5–15)
BUN: 58 mg/dL — AB (ref 6–20)
CALCIUM: 9.8 mg/dL (ref 8.9–10.3)
CO2: 32 mmol/L (ref 22–32)
CREATININE: 9.94 mg/dL — AB (ref 0.44–1.00)
Chloride: 97 mmol/L — ABNORMAL LOW (ref 101–111)
GFR calc non Af Amer: 3 mL/min — ABNORMAL LOW (ref >60)
GFR, EST AFRICAN AMERICAN: 4 mL/min — AB (ref >60)
Glucose, Bld: 114 mg/dL — ABNORMAL HIGH (ref 65–99)
Potassium: 4.3 mmol/L (ref 3.5–5.1)
SODIUM: 142 mmol/L (ref 135–145)

## 2015-08-29 MED ORDER — OXYCODONE-ACETAMINOPHEN 5-325 MG PO TABS
1.0000 | ORAL_TABLET | Freq: Once | ORAL | Status: AC
  Administered 2015-08-29: 1 via ORAL
  Filled 2015-08-29: qty 1

## 2015-08-29 NOTE — ED Provider Notes (Signed)
CSN: [id number]     Arrival date & time 08/29/15  1234 History   First MD Initiated Contact with Patient 08/29/15 1323     Chief Complaint  Patient presents with  . Vascular Access Problem    needs routine dialysis     (Consider location/radiation/quality/duration/timing/severity/associated sxs/prior Treatment) HPI Patient presents to the emergency department , resting help with dialysis.  The patient states that she last had dialysis on Thursday.  She recently moved to [place].  Patient states that she has no complaints.  The shortness of breath, chest pain, nausea, vomiting, weakness, edema, fever, pain, or syncope.  The patient states that she did not set up her dialysis here in [place]. Past Medical History  Diagnosis Date  . Diabetes mellitus   . Hypertension   . Hyperlipidemia   . GERD (gastroesophageal reflux disease)   . Arthritis   . H/O: Bell's palsy   . Chronic kidney disease   . Dialysis patient [facility])     M-W-F @ [facility]  . Ejection fraction    Past Surgical History  Procedure Laterality Date  . Tonsillectomy    . Btl    . Arteriovenous graft placement      Left forearm x 2, one removed in March  . Arteriovenous graft placement      Left forearm  . Laparotomy      Pelvic mass (Benign)  . Avgg removal  10/08/2011    Procedure: REMOVAL OF ARTERIOVENOUS GORETEX GRAFT (AVGG);  Surgeon: Sherren Kerns, MD;  Location: [facility] OR;  Service: Vascular;  Laterality: Left;  . Abdominal hysterectomy     Family History  Problem Relation Age of Onset  . Diabetes Mother   . Heart disease Mother   . COPD Father   . Hypertension Father    Social History  Substance Use Topics  . Smoking status: Former Smoker    Types: Cigarettes    Quit date: 08/04/1999  . Smokeless tobacco: Never Used  . Alcohol Use: No   OB History    No data available     Review of Systems All other systems negative except as documented in the HPI. All pertinent positives and negatives as  reviewed in the HPI.   Allergies  Lactose intolerance (gi)  Home Medications   Prior to Admission medications   Medication Sig Start Date End Date Taking? Authorizing Provider  amLODipine (NORVASC) 10 MG tablet Take 10 mg by mouth daily.     Yes Historical Provider, MD  aspirin EC 81 MG tablet Take 81 mg by mouth daily.     Yes Historical Provider, MD  calcium acetate (PHOSLO) 667 MG capsule Take 1,334 mg by mouth 3 (three) times daily with meals.    Yes Historical Provider, MD  cinacalcet (SENSIPAR) 60 MG tablet Take 60 mg by mouth daily.   Yes Historical Provider, MD  fenofibrate (TRICOR) 145 MG tablet Take 145 mg by mouth daily.     Yes Historical Provider, MD  fish oil-omega-3 fatty acids 1000 MG capsule Take 1 g by mouth at bedtime.    Yes Historical Provider, MD  insulin aspart protamine-insulin aspart (NOVOLOG 70/30) (70-30) 100 UNIT/ML injection Inject 1-5 Units into the skin 3 (three) times daily as needed. Per sliding scale   Yes Historical Provider, MD  insulin glargine (LANTUS) 100 UNIT/ML injection Inject 10-30 Units into the skin 2 (two) times daily. 10 units in the morning 30 units at bedtime   Yes Historical Provider, MD  labetalol (  NORMODYNE) 300 MG tablet Take 300 mg by mouth 2 (two) times daily.     Yes Historical Provider, MD  losartan (COZAAR) 50 MG tablet Take 50 mg by mouth daily.   Yes Historical Provider, MD  multivitamin (RENA-VIT) TABS tablet Take 1 tablet by mouth daily.     Yes Historical Provider, MD  oxyCODONE-acetaminophen (PERCOCET/ROXICET) 5-325 MG tablet Take 1-2 tablets by mouth every 4 (four) hours as needed for moderate pain or severe pain.   Yes Historical Provider, MD   BP 176/70 mmHg  Pulse 70  Temp(Src) 98.2 F (36.8 C) (Oral)  Resp 16  SpO2 98%  LMP  Physical Exam  Constitutional: She is oriented to person, place, and time. She appears well-developed and well-nourished. No distress.  HENT:  Head: Normocephalic and atraumatic.   Mouth/Throat: Oropharynx is clear and moist.  Eyes: Pupils are equal, round, and reactive to light.  Neck: Normal range of motion. Neck supple.  Cardiovascular: Normal rate, regular rhythm and normal heart sounds.  Exam reveals no gallop and no friction rub.   No murmur heard. Pulmonary/Chest: Effort normal and breath sounds normal. No respiratory distress. She has no wheezes. She has no rales.  Musculoskeletal: She exhibits edema.  Neurological: She is alert and oriented to person, place, and time. She exhibits normal muscle tone. Coordination normal.  Skin: Skin is warm and dry. No rash noted. No erythema.  Nursing note and vitals reviewed.   ED Course  Procedures (including critical care time) Labs Review Labs Reviewed  BASIC METABOLIC PANEL - Abnormal; Notable for the following:    Chloride 97 (*)    Glucose, Bld 114 (*)    BUN 58 (*)    Creatinine, Ser 9.94 (*)    GFR calc non Af Amer 3 (*)    GFR calc Af Amer 4 (*)    All other components within normal limits  CBC WITH DIFFERENTIAL/PLATELET - Abnormal; Notable for the following:    RBC 3.56 (*)    Hemoglobin 10.4 (*)    HCT 32.9 (*)    RDW 17.3 (*)    All other components within normal limits    Imaging Review No results found. I have personally reviewed and evaluated these images and lab results as part of my medical decision-making.  Patient does not have any electrolyte abnormalities.  She also is short of breath and does not have any physical exam findings that are concerning.  I spoke with Dr.Deterding who advised to have the patient call her dialysis center to set up a different location for dialysis    Charlestine Night, PA-C 08/29/15 1517  Cathren Laine, MD 08/30/15 1202

## 2015-08-29 NOTE — ED Notes (Signed)
Pt has moved to [facility] and needs routine dialysis.  Last HD was last Thursday.  Pt denies any other symptoms.  Pt in NAD.

## 2015-08-29 NOTE — Discharge Instructions (Signed)
You will need to call your dialysis center and set up a transfer to a [facility] facility.  I did speak with the nephrologist on call and he has spoken with them, and they are expecting your call

## 2015-10-16 ENCOUNTER — Emergency Department (HOSPITAL_COMMUNITY): Payer: Medicare HMO

## 2015-10-16 ENCOUNTER — Observation Stay (HOSPITAL_COMMUNITY)
Admission: EM | Admit: 2015-10-16 | Discharge: 2015-10-18 | Disposition: A | Discharge status: Home / Self Care | Payer: Medicare HMO | Attending: Internal Medicine | Admitting: Internal Medicine

## 2015-10-16 ENCOUNTER — Encounter (HOSPITAL_COMMUNITY): Payer: Self-pay

## 2015-10-16 DIAGNOSIS — E162 Hypoglycemia, unspecified: Secondary | ICD-10-CM | POA: Diagnosis present

## 2015-10-16 DIAGNOSIS — R410 Disorientation, unspecified: Secondary | ICD-10-CM | POA: Insufficient documentation

## 2015-10-16 DIAGNOSIS — R7989 Other specified abnormal findings of blood chemistry: Secondary | ICD-10-CM | POA: Diagnosis not present

## 2015-10-16 DIAGNOSIS — N186 End stage renal disease: Secondary | ICD-10-CM

## 2015-10-16 DIAGNOSIS — Z992 Dependence on renal dialysis: Secondary | ICD-10-CM | POA: Diagnosis not present

## 2015-10-16 DIAGNOSIS — Z794 Long term (current) use of insulin: Secondary | ICD-10-CM | POA: Diagnosis not present

## 2015-10-16 DIAGNOSIS — N189 Chronic kidney disease, unspecified: Secondary | ICD-10-CM | POA: Diagnosis not present

## 2015-10-16 DIAGNOSIS — D72829 Elevated white blood cell count, unspecified: Principal | ICD-10-CM | POA: Insufficient documentation

## 2015-10-16 DIAGNOSIS — M1990 Unspecified osteoarthritis, unspecified site: Secondary | ICD-10-CM | POA: Diagnosis not present

## 2015-10-16 DIAGNOSIS — E1129 Type 2 diabetes mellitus with other diabetic kidney complication: Secondary | ICD-10-CM | POA: Diagnosis present

## 2015-10-16 DIAGNOSIS — E785 Hyperlipidemia, unspecified: Secondary | ICD-10-CM | POA: Insufficient documentation

## 2015-10-16 DIAGNOSIS — E11649 Type 2 diabetes mellitus with hypoglycemia without coma: Secondary | ICD-10-CM | POA: Diagnosis not present

## 2015-10-16 DIAGNOSIS — Z87891 Personal history of nicotine dependence: Secondary | ICD-10-CM | POA: Diagnosis not present

## 2015-10-16 DIAGNOSIS — R778 Other specified abnormalities of plasma proteins: Secondary | ICD-10-CM | POA: Diagnosis present

## 2015-10-16 DIAGNOSIS — R531 Weakness: Secondary | ICD-10-CM | POA: Diagnosis present

## 2015-10-16 DIAGNOSIS — K219 Gastro-esophageal reflux disease without esophagitis: Secondary | ICD-10-CM | POA: Insufficient documentation

## 2015-10-16 DIAGNOSIS — I129 Hypertensive chronic kidney disease with stage 1 through stage 4 chronic kidney disease, or unspecified chronic kidney disease: Secondary | ICD-10-CM | POA: Insufficient documentation

## 2015-10-16 DIAGNOSIS — Z7982 Long term (current) use of aspirin: Secondary | ICD-10-CM | POA: Diagnosis not present

## 2015-10-16 DIAGNOSIS — Z79899 Other long term (current) drug therapy: Secondary | ICD-10-CM | POA: Diagnosis not present

## 2015-10-16 DIAGNOSIS — I160 Hypertensive urgency: Secondary | ICD-10-CM | POA: Diagnosis not present

## 2015-10-16 DIAGNOSIS — R0981 Nasal congestion: Secondary | ICD-10-CM | POA: Diagnosis not present

## 2015-10-16 DIAGNOSIS — E1122 Type 2 diabetes mellitus with diabetic chronic kidney disease: Secondary | ICD-10-CM

## 2015-10-16 HISTORY — DX: Other specified abnormalities of plasma proteins: R77.8

## 2015-10-16 HISTORY — DX: Other specified abnormal findings of blood chemistry: R79.89

## 2015-10-16 LAB — CBC WITH DIFFERENTIAL/PLATELET
BASOS ABS: 0 10*3/uL (ref 0.0–0.1)
BASOS PCT: 0 %
Eosinophils Absolute: 0 10*3/uL (ref 0.0–0.7)
Eosinophils Relative: 0 %
HEMATOCRIT: 46.4 % — AB (ref 36.0–46.0)
Hemoglobin: 14.3 g/dL (ref 12.0–15.0)
Lymphocytes Relative: 8 %
Lymphs Abs: 1.1 10*3/uL (ref 0.7–4.0)
MCH: 28 pg (ref 26.0–34.0)
MCHC: 30.8 g/dL (ref 30.0–36.0)
MCV: 90.8 fL (ref 78.0–100.0)
MONOS PCT: 1 %
Monocytes Absolute: 0.2 10*3/uL (ref 0.1–1.0)
NEUTROS ABS: 12 10*3/uL — AB (ref 1.7–7.7)
NEUTROS PCT: 91 %
Platelets: 328 10*3/uL (ref 150–400)
RBC: 5.11 MIL/uL (ref 3.87–5.11)
RDW: 17 % — ABNORMAL HIGH (ref 11.5–15.5)
WBC: 13.2 10*3/uL — AB (ref 4.0–10.5)

## 2015-10-16 LAB — URINE MICROSCOPIC-ADD ON

## 2015-10-16 LAB — COMPREHENSIVE METABOLIC PANEL
ALBUMIN: 3.7 g/dL (ref 3.5–5.0)
ALK PHOS: 69 U/L (ref 38–126)
ALT: 21 U/L (ref 14–54)
AST: 40 U/L (ref 15–41)
Anion gap: 15 (ref 5–15)
BILIRUBIN TOTAL: 0.6 mg/dL (ref 0.3–1.2)
BUN: 47 mg/dL — AB (ref 6–20)
CALCIUM: 9 mg/dL (ref 8.9–10.3)
CO2: 29 mmol/L (ref 22–32)
Chloride: 96 mmol/L — ABNORMAL LOW (ref 101–111)
Creatinine, Ser: 6 mg/dL — ABNORMAL HIGH (ref 0.44–1.00)
GFR calc Af Amer: 7 mL/min — ABNORMAL LOW (ref >60)
GFR calc non Af Amer: 6 mL/min — ABNORMAL LOW (ref >60)
GLUCOSE: 131 mg/dL — AB (ref 65–99)
Potassium: 4.6 mmol/L (ref 3.5–5.1)
Sodium: 140 mmol/L (ref 135–145)
TOTAL PROTEIN: 7.1 g/dL (ref 6.5–8.1)

## 2015-10-16 LAB — URINALYSIS, ROUTINE W REFLEX MICROSCOPIC
Bilirubin Urine: NEGATIVE
GLUCOSE, UA: NEGATIVE mg/dL
KETONES UR: NEGATIVE mg/dL
Leukocytes, UA: NEGATIVE
Nitrite: NEGATIVE
Specific Gravity, Urine: 1.013 (ref 1.005–1.030)
pH: 8.5 — ABNORMAL HIGH (ref 5.0–8.0)

## 2015-10-16 LAB — I-STAT TROPONIN, ED
TROPONIN I, POC: 0.09 ng/mL — AB (ref 0.00–0.08)
Troponin i, poc: 0.1 ng/mL (ref 0.00–0.08)

## 2015-10-16 LAB — CBG MONITORING, ED
GLUCOSE-CAPILLARY: 162 mg/dL — AB (ref 65–99)
GLUCOSE-CAPILLARY: 82 mg/dL (ref 65–99)
GLUCOSE-CAPILLARY: 63 mg/dL — AB (ref 65–99)
Glucose-Capillary: 83 mg/dL (ref 65–99)

## 2015-10-16 LAB — PROTIME-INR
INR: 1.03 (ref 0.00–1.49)
PROTHROMBIN TIME: 13.7 s (ref 11.6–15.2)

## 2015-10-16 LAB — APTT: aPTT: 40 seconds — ABNORMAL HIGH (ref 24–37)

## 2015-10-16 MED ORDER — CALCIUM ACETATE (PHOS BINDER) 667 MG PO CAPS
1334.0000 mg | ORAL_CAPSULE | Freq: Three times a day (TID) | ORAL | Status: DC
  Administered 2015-10-17 – 2015-10-18 (×5): 1334 mg via ORAL
  Filled 2015-10-16 (×4): qty 2

## 2015-10-16 MED ORDER — PHYTONADIONE 5 MG PO TABS
2.5000 mg | ORAL_TABLET | Freq: Once | ORAL | Status: DC
  Filled 2015-10-16: qty 1

## 2015-10-16 MED ORDER — ASPIRIN EC 81 MG PO TBEC
81.0000 mg | DELAYED_RELEASE_TABLET | Freq: Every day | ORAL | Status: DC
  Administered 2015-10-17 (×2): 81 mg via ORAL
  Filled 2015-10-16 (×2): qty 1

## 2015-10-16 MED ORDER — LOSARTAN POTASSIUM 50 MG PO TABS
100.0000 mg | ORAL_TABLET | Freq: Every day | ORAL | Status: DC
  Administered 2015-10-18: 100 mg via ORAL
  Filled 2015-10-16: qty 2

## 2015-10-16 MED ORDER — FENOFIBRATE 160 MG PO TABS
160.0000 mg | ORAL_TABLET | Freq: Every day | ORAL | Status: DC
  Administered 2015-10-18: 160 mg via ORAL
  Filled 2015-10-16: qty 1

## 2015-10-16 MED ORDER — LATANOPROST 0.005 % OP SOLN
1.0000 [drp] | Freq: Every day | OPHTHALMIC | Status: DC
  Administered 2015-10-17 (×2): 1 [drp] via OPHTHALMIC
  Filled 2015-10-16: qty 2.5

## 2015-10-16 MED ORDER — HEPARIN SODIUM (PORCINE) 5000 UNIT/ML IJ SOLN
5000.0000 [IU] | Freq: Three times a day (TID) | INTRAMUSCULAR | Status: DC
  Administered 2015-10-17 – 2015-10-18 (×4): 5000 [IU] via SUBCUTANEOUS
  Filled 2015-10-16 (×2): qty 1

## 2015-10-16 MED ORDER — AMLODIPINE BESYLATE 10 MG PO TABS
10.0000 mg | ORAL_TABLET | Freq: Every day | ORAL | Status: DC
  Administered 2015-10-17 (×2): 10 mg via ORAL
  Filled 2015-10-16 (×2): qty 1

## 2015-10-16 MED ORDER — LABETALOL HCL 300 MG PO TABS
300.0000 mg | ORAL_TABLET | Freq: Two times a day (BID) | ORAL | Status: DC
  Administered 2015-10-17 – 2015-10-18 (×3): 300 mg via ORAL
  Filled 2015-10-16 (×3): qty 1

## 2015-10-16 MED ORDER — CINACALCET HCL 30 MG PO TABS
60.0000 mg | ORAL_TABLET | Freq: Every day | ORAL | Status: DC
  Administered 2015-10-17 (×2): 60 mg via ORAL
  Filled 2015-10-16 (×3): qty 2

## 2015-10-16 MED ORDER — INSULIN GLARGINE 100 UNIT/ML ~~LOC~~ SOLN
30.0000 [IU] | Freq: Every morning | SUBCUTANEOUS | Status: DC
  Filled 2015-10-16: qty 0.3

## 2015-10-16 MED ORDER — CALCIUM CARBONATE ANTACID 500 MG PO CHEW
1.0000 | CHEWABLE_TABLET | Freq: Every day | ORAL | Status: DC | PRN
  Administered 2015-10-17: 400 mg via ORAL
  Filled 2015-10-16: qty 2

## 2015-10-16 NOTE — ED Notes (Signed)
Pt. Presents with complaint of hypoglycemia, CBG initially 47. Given D50 and 1mg  glucagon, CBG now 163. Pt. Took regular dose of insulin and only ate 1 english muffin this AM. Pt. States she is not as hungry as usual but continues taking regular insulin. Pt. Is lethargic on assessment. Pt. Is AxO x4.

## 2015-10-16 NOTE — ED Notes (Signed)
Spoke with Maryfrances Bunnellanford, MD patient OK to go to Med Surg.

## 2015-10-16 NOTE — ED Notes (Signed)
Noted BGL and gave pt container of orange juice, can of ginger ale, [place] sandwich, and apple sauce. Will repeat CBG after she finishes meal.

## 2015-10-16 NOTE — H&P (Signed)
History and Physical  Patient Name: Colleen Mullins     ZOX:[id number]    DOB: 1946/04/02    DOA: 10/16/2015 Referring physician: Elizabeth Sauer, PA-C and Carolynn Serve, MD PCP: Dan Maker, MD      Chief Complaint: Insulin overdose  HPI: Colleen Mullins is a 69 y.o. female with a past medical history significant for ESRD on HD TThS, IDDM, and HTN who presents with hypoglycemia.  The patient recently moved in with her daughter and lost her sliding scale insulin instructions.  This morning, she got up and her BG was 127 mg/dL, she knew she was supposed to give 1 unit Novolog for BG 100-125, and so she thought she should give 10 units for 127, although she is able to state now that this didn't seem right at the time.  Then she went to eat an english muffin, but only took two bites before feeling nauseated and stopping.  She subsequently developed sweats, shakiness, and clouded thinking.  She remembers her grandson asking her for bus fare before she lay down and passed out.  Some time later her daughter found her "in the closet" passed out and called EMS.    EMS found the patient with BG 47 mg/dL and gave W11 and glucagon.  In the ED, the patient was initially sleepy and hypothermic, but became more alert with time.  A troponin level was minimally elevated and there was leukocytosis without clinical signs of infection.  TRH were asked to admit for observation for hypoglycemia and elevated troponin.     Review of Systems:  Pt complains of diaphoresis, shakiness, passing out, mild developing URI symptoms. Pt denies any chest discomfort, palpitations, cough or sputum or dyspnea or dysuria/hematuria.  All other systems negative except as just noted or noted in the history of present illness.  Allergies  Allergen Reactions  . Lactose Intolerance (Gi) Other (See Comments)    Abdominal pains and bloating    Prior to Admission medications   Medication Sig Start Date End Date Taking? Authorizing Provider    acetaminophen (TYLENOL) 325 MG tablet Take 650 mg by mouth every 6 (six) hours as needed for moderate pain.   Yes Historical Provider, MD  amLODipine (NORVASC) 10 MG tablet Take 10 mg by mouth at bedtime.    Yes Historical Provider, MD  aspirin EC 81 MG tablet Take 81 mg by mouth at bedtime.    Yes Historical Provider, MD  Ca Carbonate-Mag Hydroxide (ROLAIDS) 550-110 MG CHEW Chew 1-2 tablets by mouth daily as needed (for heartburn).   Yes Historical Provider, MD  calcium acetate (PHOSLO) 667 MG capsule Take 1,334 mg by mouth 3 (three) times daily with meals. Takes 2 caps with meals and 1 cap with snacks   Yes Historical Provider, MD  cinacalcet (SENSIPAR) 60 MG tablet Take 60 mg by mouth at bedtime.    Yes Historical Provider, MD  fenofibrate (TRICOR) 145 MG tablet Take 145 mg by mouth every evening.    Yes Historical Provider, MD  fish oil-omega-3 fatty acids 1000 MG capsule Take 1 g by mouth at bedtime.    Yes Historical Provider, MD  insulin aspart protamine-insulin aspart (NOVOLOG 70/30) (70-30) 100 UNIT/ML injection Inject 0-10 Units into the skin 3 (three) times daily as needed (sliding scale). Per sliding scale   Yes Historical Provider, MD  insulin glargine (LANTUS) 100 UNIT/ML injection Inject 30 Units into the skin every morning. 10 units in the morning 30 units at bedtime   Yes Historical Provider,  MD  labetalol (NORMODYNE) 300 MG tablet Take 300 mg by mouth 2 (two) times daily.     Yes Historical Provider, MD  losartan (COZAAR) 50 MG tablet Take 50 mg by mouth daily.   Yes Historical Provider, MD  multivitamin (RENA-VIT) TABS tablet Take 1 tablet by mouth daily.     Yes Historical Provider, MD  Travoprost, BAK Free, (TRAVATAN) 0.004 % SOLN ophthalmic solution Place 1 drop into both eyes at bedtime.   Yes Historical Provider, MD    Past Medical History  Diagnosis Date  . Diabetes mellitus   . Hypertension   . Hyperlipidemia   . GERD (gastroesophageal reflux disease)   . Arthritis    . H/O: Bell's palsy   . Chronic kidney disease   . Dialysis patient [facility](HCC)     M-W-F @ [facility]  . Ejection fraction     Past Surgical History  Procedure Laterality Date  . Tonsillectomy    . Btl    . Arteriovenous graft placement      Left forearm x 2, one removed in March  . Arteriovenous graft placement      Left forearm  . Laparotomy      Pelvic mass (Benign)  . Avgg removal  10/08/2011    Procedure: REMOVAL OF ARTERIOVENOUS GORETEX GRAFT (AVGG);  Surgeon: Sherren Kernsharles E Fields, MD;  [facility];  Service: Vascular;  Laterality: Left;  . Abdominal hysterectomy      Family history: family history includes COPD in her father; Diabetes in her mother; Heart disease in her mother; Hypertension in her father.  Social History: Patient lives with her daughter, but recently lived alone.  She is a never smoker.  She ambulates with a cane, and is independent with all ADLs.         Physical Exam: BP 200/86 mmHg  Pulse 78  Temp(Src) 96 F (35.6 C) (Temporal)  Resp 18  SpO2 100% General appearance: Well-developed, adult female, alert and in no acute distress.   Eyes: Anicteric, conjunctiva pink, lids and lashes normal.     ENT: No nasal deformity, discharge, or epistaxis.  OP moist without lesions. Dentures.  Skin: Warm and dry.   Cardiac: RRR, nl S1-S2, no murmurs appreciated.  Capillary refill is brisk.  JVP normal.  No LE edema.  Radial pulses 2+ and symmetric. Respiratory: Normal respiratory rate and rhythm.  CTAB without rales or wheezes. Abdomen: Abdomen soft without rigidity.  No TTP. No ascites, distension.   MSK: No deformities or effusions. Neuro: Sensorium intact and responding to questions, attention normal.  Speech is fluent.  Moves all extremities equally and with normal coordination.    Psych: Behavior appropriate.  Affect normal.  No evidence of aural or visual hallucinations or delusions.       Labs on Admission:  The metabolic panel shows elevated  creatinine. The troponin is minimally elevated. The INR is normal. The complete blood count shows mild leukocytosis. Urinalysis is bland.   Radiological Exams on Admission: Personally reviewed: Ct Head Wo Contrast  10/16/2015  CLINICAL DATA:  Transient episode of altered mental status today. Hypoglycemia. Initial encounter. EXAM: CT HEAD WITHOUT CONTRAST TECHNIQUE: Contiguous axial images were obtained from the base of the skull through the vertex without intravenous contrast. COMPARISON:  Head CT scan 08/15/2010. FINDINGS: There is cortical atrophy. Prominence of the ventricular system is unchanged. No evidence of acute intracranial abnormality including hemorrhage, infarct, mass lesion, mass effect, midline shift or abnormal extra-axial fluid collection is seen. There  is no hydrocephalus or pneumocephalus. The calvarium is intact. Imaged paranasal sinuses and mastoid air cells are clear. IMPRESSION: No acute abnormality.  The cortical atrophy. Electronically Signed   By: Drusilla Kanner M.D.   On: 10/16/2015 20:00    EKG: Independently reviewed. Left axis deviation with no ST changes.  QTc minimally elevated.  Rate 71.    Assessment/Plan 1. Hypoglycemia in patient with IDDM:  This is new.  The patient is able to give a coherent history of overdosing herself with insulin after losing her sliding scale instructions.   -Continue home Glargine -Hold home corrections -Consult to Diabetes educator to verify with the patient  -Will order post-prandial BG in hospital to aid in titrating sliding scale   2. Leukocytosis:  This is new.  Suspect this is reactive.  No evidence of infection at this time.  3. Elevated troponin:  This is likely poor clearance in this patient with ESRD -Trend troponin -If no elevation overnight, no particular follow up recommended  4. ESRD on HD TThS:  -Consult to nephrology for maintenance dialysis tomorrow  5. Hypertensive urgency:  -Restart home oral  medications and hopefully dialysis tomorrow    DVT PPx: Heparin Diet: Carb modified Consultants: Nephrology and Diabetes Ed Code Status: DNR Family Communication: None  Medical decision making: What exists of the patient's previous chart was reviewed in depth and the case was discussed with Elizabeth Sauer, PA-C. Patient seen 9:12 PM on 10/16/2015.  Disposition Plan:  Admit for observation after hypoglycemia and Diabetes education.  If troponin elevation does not worsen, discharge to home with outpatient follow up.      Alberteen Sam [facility] Pager [phone]

## 2015-10-16 NOTE — ED Notes (Signed)
MD at bedside. 

## 2015-10-16 NOTE — ED Provider Notes (Signed)
CSN: [id number]     Arrival date & time 10/16/15  1618 History   First MD Initiated Contact with Patient 10/16/15 1623     Chief Complaint  Patient presents with  . Hypoglycemia     (Consider location/radiation/quality/duration/timing/severity/associated sxs/prior Treatment) Patient is a 69 y.o. female presenting with hypoglycemia. The history is provided by the patient, medical records and a relative. No language interpreter was used.  Hypoglycemia Associated symptoms: weakness   Associated symptoms: no dizziness, no shortness of breath, no sweats and no vomiting    Jaynia Fendley is a 69 y.o. female  with a PMH of DM, HTN, HLD, GERD, CKD presents to the Emergency Department after being found down by daughter who states blood sugar was 38 when checked by daughter. Unsure of how long she was down, but per daughter, last seen by grandson around lunch time. EMS gave her D50 an 1 mg glucagon PTA. When asked about the events PTA she states that her last memory with talking with her grandson, then the ambulance bringing her here. She states that over the past two days, she has felt a cold coming on associated with loss of appetite, but still taking her normal amount of insulin.  Of note, she has been out of her blood pressure medicine for approx. 1 week.   Past Medical History  Diagnosis Date  . Diabetes mellitus   . Hypertension   . Hyperlipidemia   . GERD (gastroesophageal reflux disease)   . Arthritis   . H/O: Bell's palsy   . Chronic kidney disease   . Dialysis patient [facility])     M-W-F @ [facility]  . Ejection fraction    Past Surgical History  Procedure Laterality Date  . Tonsillectomy    . Btl    . Arteriovenous graft placement      Left forearm x 2, one removed in March  . Arteriovenous graft placement      Left forearm  . Laparotomy      Pelvic mass (Benign)  . Avgg removal  10/08/2011    Procedure: REMOVAL OF ARTERIOVENOUS GORETEX GRAFT (AVGG);  Surgeon: Sherren Kerns,  MD;  Location: [facility] OR;  Service: Vascular;  Laterality: Left;  . Abdominal hysterectomy     Family History  Problem Relation Age of Onset  . Diabetes Mother   . Heart disease Mother   . COPD Father   . Hypertension Father    Social History  Substance Use Topics  . Smoking status: Former Smoker    Types: Cigarettes    Quit date: 08/04/1999  . Smokeless tobacco: Never Used  . Alcohol Use: No   OB History    No data available     Review of Systems  Constitutional: Positive for appetite change. Negative for fever, chills, diaphoresis and activity change.  HENT: Positive for congestion. Negative for rhinorrhea, sore throat and trouble swallowing.   Eyes: Negative for visual disturbance.  Respiratory: Negative for cough, shortness of breath and wheezing.   Cardiovascular: Negative.   Gastrointestinal: Negative for nausea, vomiting, abdominal pain, diarrhea and constipation.  Musculoskeletal: Negative for myalgias, back pain, arthralgias and neck pain.  Skin: Negative for rash.  Neurological: Positive for weakness. Negative for dizziness and headaches.  Psychiatric/Behavioral: Positive for confusion.      Allergies  Lactose intolerance (gi)  Home Medications   Prior to Admission medications   Medication Sig Start Date End Date Taking? Authorizing Provider  acetaminophen (TYLENOL) 325 MG tablet Take 650 mg by  mouth every 6 (six) hours as needed for moderate pain.   Yes Historical Provider, MD  amLODipine (NORVASC) 10 MG tablet Take 10 mg by mouth at bedtime.    Yes Historical Provider, MD  aspirin EC 81 MG tablet Take 81 mg by mouth at bedtime.    Yes Historical Provider, MD  Ca Carbonate-Mag Hydroxide (ROLAIDS) 550-110 MG CHEW Chew 1-2 tablets by mouth daily as needed (for heartburn).   Yes Historical Provider, MD  calcium acetate (PHOSLO) 667 MG capsule Take 1,334 mg by mouth 3 (three) times daily with meals. Takes 2 caps with meals and 1 cap with snacks   Yes Historical  Provider, MD  cinacalcet (SENSIPAR) 60 MG tablet Take 60 mg by mouth at bedtime.    Yes Historical Provider, MD  fenofibrate (TRICOR) 145 MG tablet Take 145 mg by mouth every evening.    Yes Historical Provider, MD  fish oil-omega-3 fatty acids 1000 MG capsule Take 1 g by mouth at bedtime.    Yes Historical Provider, MD  insulin aspart protamine-insulin aspart (NOVOLOG 70/30) (70-30) 100 UNIT/ML injection Inject 0-10 Units into the skin 3 (three) times daily as needed (sliding scale). Per sliding scale   Yes Historical Provider, MD  insulin glargine (LANTUS) 100 UNIT/ML injection Inject 30 Units into the skin every morning. 10 units in the morning 30 units at bedtime   Yes Historical Provider, MD  labetalol (NORMODYNE) 300 MG tablet Take 300 mg by mouth 2 (two) times daily.     Yes Historical Provider, MD  losartan (COZAAR) 50 MG tablet Take 50 mg by mouth daily.   Yes Historical Provider, MD  multivitamin (RENA-VIT) TABS tablet Take 1 tablet by mouth daily.     Yes Historical Provider, MD  Travoprost, BAK Free, (TRAVATAN) 0.004 % SOLN ophthalmic solution Place 1 drop into both eyes at bedtime.   Yes Historical Provider, MD   BP 192/72 mmHg  Pulse 104  Temp(Src) 96 F (35.6 C) (Temporal)  Resp 25  SpO2 98% Physical Exam  Constitutional: She is oriented to person, place, and time. She appears well-developed and well-nourished.  Alert and in no acute distress  HENT:  Head: Normocephalic and atraumatic.  Cardiovascular: Normal rate, regular rhythm, normal heart sounds and intact distal pulses.  Exam reveals no gallop and no friction rub.   No murmur heard. Pulmonary/Chest: Effort normal and breath sounds normal. No respiratory distress. She has no wheezes. She has no rales. She exhibits no tenderness.  Abdominal: She exhibits no mass. There is no rebound and no guarding.  Abdomen soft, non-tender, non-distended Bowel sounds positive in all four quadrants  Musculoskeletal: She exhibits no  edema.  Neurological: She is alert and oriented to person, place, and time.  Alert, oriented, thought content appropriate. Speech is clear and goal oriented, however she is confused about the events pta. Appears lethargic. Able to follow commands.   Cranial Nerves:  II:  Peripheral visual fields grossly normal, pupils equal, round, reactive to light III, IV, VI: EOM intact bilaterally, ptosis not present V,VII: smile symmetric, eyes kept closed tightly against resistance, facial light touch sensation equal VIII: hearing grossly normal IX, X: symmetric soft palate movement, uvula elevates symmetrically  XI: bilateral shoulder shrug symmetric and strong XII: midline tongue extension  5/5 muscle strength in upper and lower extremities bilaterally including strong and equal grip strength and dorsiflexion/plantar flexion  Sensory to light touch normal in all four extremities.  Normal finger-to-nose and rapid alternating movements;  Skin: Skin is warm and dry. No rash noted.  Psychiatric: She has a normal mood and affect. Her behavior is normal. Judgment and thought content normal.  Nursing note and vitals reviewed.   ED Course  Procedures (including critical care time) Labs Review Labs Reviewed  CBC WITH DIFFERENTIAL/PLATELET - Abnormal; Notable for the following:    WBC 13.2 (*)    HCT 46.4 (*)    RDW 17.0 (*)    Neutro Abs 12.0 (*)    All other components within normal limits  COMPREHENSIVE METABOLIC PANEL - Abnormal; Notable for the following:    Chloride 96 (*)    Glucose, Bld 131 (*)    BUN 47 (*)    Creatinine, Ser 6.00 (*)    GFR calc non Af Amer 6 (*)    GFR calc Af Amer 7 (*)    All other components within normal limits  URINALYSIS, ROUTINE W REFLEX MICROSCOPIC (NOT AT [facility]) - Abnormal; Notable for the following:    pH 8.5 (*)    Hgb urine dipstick SMALL (*)    Protein, ur >300 (*)    All other components within normal limits  APTT - Abnormal; Notable for the  following:    aPTT 40 (*)    All other components within normal limits  URINE MICROSCOPIC-ADD ON - Abnormal; Notable for the following:    Squamous Epithelial / LPF 0-5 (*)    Bacteria, UA RARE (*)    Casts HYALINE CASTS (*)    All other components within normal limits  CBG MONITORING, ED - Abnormal; Notable for the following:    Glucose-Capillary 162 (*)    All other components within normal limits  I-STAT TROPOININ, ED - Abnormal; Notable for the following:    Troponin i, poc 0.10 (*)    All other components within normal limits  I-STAT TROPOININ, ED - Abnormal; Notable for the following:    Troponin i, poc 0.09 (*)    All other components within normal limits  CBG MONITORING, ED - Abnormal; Notable for the following:    Glucose-Capillary 63 (*)    All other components within normal limits  PROTIME-INR  CBG MONITORING, ED  CBG MONITORING, ED    Imaging Review Ct Head Wo Contrast  10/16/2015  CLINICAL DATA:  Transient episode of altered mental status today. Hypoglycemia. Initial encounter. EXAM: CT HEAD WITHOUT CONTRAST TECHNIQUE: Contiguous axial images were obtained from the base of the skull through the vertex without intravenous contrast. COMPARISON:  Head CT scan 08/15/2010. FINDINGS: There is cortical atrophy. Prominence of the ventricular system is unchanged. No evidence of acute intracranial abnormality including hemorrhage, infarct, mass lesion, mass effect, midline shift or abnormal extra-axial fluid collection is seen. There is no hydrocephalus or pneumocephalus. The calvarium is intact. Imaged paranasal sinuses and mastoid air cells are clear. IMPRESSION: No acute abnormality.  The cortical atrophy. Electronically Signed   By: Drusilla Kannerhomas  Dalessio M.D.   On: 10/16/2015 20:00   I have personally reviewed and evaluated these images and lab results as part of my medical decision-making.   EKG Interpretation None      MDM   Final diagnoses:  Leukocytosis   Rennie PlowmanDianne Rahrig  presents after being found down at home, last known normal, with blood sugar of 38 per daughter.   Labs: CBG 162 at arrival; white count 13.2 with ab neutro 12; Trop 0.10 -- will repeat in 3 hrs.  CT head with no acute abnlity  6:09 PM -  Pt. Re-evaluated; still appears lethargic. CBG 82; awaiting urine; will get OJ  8:09 PM - Patient mentation much improved.   With increased trop, want to ensure the hypoglycemic episode was not due to ischemia. White count with shift is also concerning with no clear source. Will admit to medicine for ACS rule out and further evaluation and management on hypoglycemia.   Patient seen by and discussed with Dr. Clayborne Dana who agrees with treatment plan.    [facility] Arella Blinder, PA-C 10/16/15 2229  Marily Memos, MD 10/17/15 [phone]

## 2015-10-17 ENCOUNTER — Encounter (HOSPITAL_COMMUNITY): Payer: Self-pay | Admitting: *Deleted

## 2015-10-17 DIAGNOSIS — D72829 Elevated white blood cell count, unspecified: Secondary | ICD-10-CM | POA: Diagnosis not present

## 2015-10-17 DIAGNOSIS — I160 Hypertensive urgency: Secondary | ICD-10-CM | POA: Diagnosis not present

## 2015-10-17 DIAGNOSIS — R7989 Other specified abnormal findings of blood chemistry: Secondary | ICD-10-CM | POA: Diagnosis not present

## 2015-10-17 DIAGNOSIS — E162 Hypoglycemia, unspecified: Secondary | ICD-10-CM | POA: Diagnosis not present

## 2015-10-17 DIAGNOSIS — E1122 Type 2 diabetes mellitus with diabetic chronic kidney disease: Secondary | ICD-10-CM | POA: Diagnosis not present

## 2015-10-17 LAB — RENAL FUNCTION PANEL
Albumin: 3.2 g/dL — ABNORMAL LOW (ref 3.5–5.0)
Anion gap: 15 (ref 5–15)
BUN: 55 mg/dL — ABNORMAL HIGH (ref 6–20)
CO2: 27 mmol/L (ref 22–32)
Calcium: 8.5 mg/dL — ABNORMAL LOW (ref 8.9–10.3)
Chloride: 93 mmol/L — ABNORMAL LOW (ref 101–111)
Creatinine, Ser: 6.83 mg/dL — ABNORMAL HIGH (ref 0.44–1.00)
GFR calc Af Amer: 6 mL/min — ABNORMAL LOW (ref >60)
GFR calc non Af Amer: 5 mL/min — ABNORMAL LOW (ref >60)
Glucose, Bld: 120 mg/dL — ABNORMAL HIGH (ref 65–99)
Phosphorus: 5.4 mg/dL — ABNORMAL HIGH (ref 2.5–4.6)
Potassium: 4.8 mmol/L (ref 3.5–5.1)
Sodium: 135 mmol/L (ref 135–145)

## 2015-10-17 LAB — CBC
HCT: 37.2 % (ref 36.0–46.0)
HCT: 36.3 % (ref 36.0–46.0)
Hemoglobin: 11.6 g/dL — ABNORMAL LOW (ref 12.0–15.0)
Hemoglobin: 11.7 g/dL — ABNORMAL LOW (ref 12.0–15.0)
MCH: 28 pg (ref 26.0–34.0)
MCH: 28.6 pg (ref 26.0–34.0)
MCHC: 31.2 g/dL (ref 30.0–36.0)
MCHC: 32.2 g/dL (ref 30.0–36.0)
MCV: 89.6 fL (ref 78.0–100.0)
MCV: 88.8 fL (ref 78.0–100.0)
PLATELETS: 364 10*3/uL (ref 150–400)
Platelets: 353 10*3/uL (ref 150–400)
RBC: 4.15 MIL/uL (ref 3.87–5.11)
RBC: 4.09 MIL/uL (ref 3.87–5.11)
RDW: 17.1 % — ABNORMAL HIGH (ref 11.5–15.5)
RDW: 17.4 % — ABNORMAL HIGH (ref 11.5–15.5)
WBC: 8.7 10*3/uL (ref 4.0–10.5)
WBC: 7.9 K/uL (ref 4.0–10.5)

## 2015-10-17 LAB — BASIC METABOLIC PANEL
Anion gap: 14 (ref 5–15)
BUN: 53 mg/dL — AB (ref 6–20)
CHLORIDE: 92 mmol/L — AB (ref 101–111)
CO2: 30 mmol/L (ref 22–32)
CREATININE: 6.67 mg/dL — AB (ref 0.44–1.00)
Calcium: 8.5 mg/dL — ABNORMAL LOW (ref 8.9–10.3)
GFR calc non Af Amer: 6 mL/min — ABNORMAL LOW (ref >60)
GFR, EST AFRICAN AMERICAN: 7 mL/min — AB (ref >60)
Glucose, Bld: 133 mg/dL — ABNORMAL HIGH (ref 65–99)
Potassium: 4.3 mmol/L (ref 3.5–5.1)
Sodium: 136 mmol/L (ref 135–145)

## 2015-10-17 LAB — TROPONIN I
Troponin I: 0.22 ng/mL — ABNORMAL HIGH (ref <0.031)
Troponin I: 0.19 ng/mL — ABNORMAL HIGH (ref <0.031)
Troponin I: 0.19 ng/mL — ABNORMAL HIGH (ref <0.031)

## 2015-10-17 LAB — GLUCOSE, CAPILLARY
GLUCOSE-CAPILLARY: 141 mg/dL — AB (ref 65–99)
GLUCOSE-CAPILLARY: 195 mg/dL — AB (ref 65–99)
Glucose-Capillary: 102 mg/dL — ABNORMAL HIGH (ref 65–99)
Glucose-Capillary: 250 mg/dL — ABNORMAL HIGH (ref 65–99)

## 2015-10-17 MED ORDER — ACETAMINOPHEN 325 MG PO TABS
650.0000 mg | ORAL_TABLET | Freq: Four times a day (QID) | ORAL | Status: DC | PRN
  Administered 2015-10-17: 650 mg via ORAL
  Filled 2015-10-17: qty 2

## 2015-10-17 MED ORDER — SODIUM CHLORIDE 0.9 % IV SOLN: 100.0000 mL | INTRAVENOUS | Status: DC | PRN

## 2015-10-17 MED ORDER — HEPARIN SODIUM (PORCINE) 1000 UNIT/ML DIALYSIS: 1000.0000 [IU] | INTRAMUSCULAR | Status: DC | PRN

## 2015-10-17 MED ORDER — LIDOCAINE-PRILOCAINE 2.5-2.5 % EX CREA: 1.0000 "application " | TOPICAL_CREAM | CUTANEOUS | Status: DC | PRN

## 2015-10-17 MED ORDER — CAMPHOR-MENTHOL 0.5-0.5 % EX LOTN
1.0000 "application " | TOPICAL_LOTION | Freq: Three times a day (TID) | CUTANEOUS | Status: DC | PRN
  Filled 2015-10-17: qty 222

## 2015-10-17 MED ORDER — LIDOCAINE HCL (PF) 1 % IJ SOLN: 5.0000 mL | INTRAMUSCULAR | Status: DC | PRN

## 2015-10-17 MED ORDER — INSULIN GLARGINE 100 UNIT/ML ~~LOC~~ SOLN
10.0000 [IU] | Freq: Every day | SUBCUTANEOUS | Status: DC
  Administered 2015-10-17: 10 [IU] via SUBCUTANEOUS
  Filled 2015-10-17 (×2): qty 0.1

## 2015-10-17 MED ORDER — HYDROXYZINE HCL 25 MG PO TABS: 25.0000 mg | ORAL_TABLET | Freq: Three times a day (TID) | ORAL | Status: DC | PRN

## 2015-10-17 MED ORDER — HEPARIN SODIUM (PORCINE) 1000 UNIT/ML DIALYSIS
3000.0000 [IU] | Freq: Once | INTRAMUSCULAR | Status: AC
  Administered 2015-10-17: 3000 [IU] via INTRAVENOUS_CENTRAL

## 2015-10-17 MED ORDER — HYDRALAZINE HCL 20 MG/ML IJ SOLN: 5.0000 mg | Freq: Four times a day (QID) | INTRAMUSCULAR | Status: DC | PRN

## 2015-10-17 MED ORDER — ONDANSETRON HCL 4 MG PO TABS: 4.0000 mg | ORAL_TABLET | Freq: Four times a day (QID) | ORAL | Status: DC | PRN

## 2015-10-17 MED ORDER — RENA-VITE PO TABS
1.0000 | ORAL_TABLET | Freq: Every day | ORAL | Status: DC
  Administered 2015-10-17: 1 via ORAL
  Filled 2015-10-17: qty 1

## 2015-10-17 MED ORDER — ONDANSETRON HCL 4 MG/2ML IJ SOLN: 4.0000 mg | Freq: Four times a day (QID) | INTRAMUSCULAR | Status: DC | PRN

## 2015-10-17 MED ORDER — ACETAMINOPHEN 325 MG PO TABS
ORAL_TABLET | ORAL | Status: AC
  Filled 2015-10-17: qty 2

## 2015-10-17 MED ORDER — DOCUSATE SODIUM 283 MG RE ENEM
1.0000 | ENEMA | RECTAL | Status: DC | PRN
  Filled 2015-10-17: qty 1

## 2015-10-17 MED ORDER — PENTAFLUOROPROP-TETRAFLUOROETH EX AERO: 1.0000 "application " | INHALATION_SPRAY | CUTANEOUS | Status: DC | PRN

## 2015-10-17 MED ORDER — ACETAMINOPHEN 650 MG RE SUPP: 650.0000 mg | Freq: Four times a day (QID) | RECTAL | Status: DC | PRN

## 2015-10-17 MED ORDER — NEPRO/CARBSTEADY PO LIQD
237.0000 mL | Freq: Three times a day (TID) | ORAL | Status: DC | PRN
Start: 2015-10-17 — End: 2015-10-18

## 2015-10-17 MED ORDER — ZOLPIDEM TARTRATE 5 MG PO TABS: 5.0000 mg | ORAL_TABLET | Freq: Every evening | ORAL | Status: DC | PRN

## 2015-10-17 MED ORDER — SORBITOL 70 % SOLN: 30.0000 mL | Status: DC | PRN

## 2015-10-17 MED ORDER — INSULIN ASPART 100 UNIT/ML ~~LOC~~ SOLN: 0.0000 [IU] | Freq: Three times a day (TID) | SUBCUTANEOUS | Status: DC

## 2015-10-17 MED ORDER — ACETAMINOPHEN 325 MG PO TABS
650.0000 mg | ORAL_TABLET | Freq: Four times a day (QID) | ORAL | Status: DC | PRN
  Administered 2015-10-17: 650 mg via ORAL

## 2015-10-17 MED ORDER — PANTOPRAZOLE SODIUM 40 MG PO TBEC
40.0000 mg | DELAYED_RELEASE_TABLET | Freq: Every evening | ORAL | Status: DC
  Administered 2015-10-17 – 2015-10-18 (×2): 40 mg via ORAL
  Filled 2015-10-17 (×3): qty 1

## 2015-10-17 MED ORDER — CALCIUM CARBONATE 1250 MG/5ML PO SUSP
500.0000 mg | Freq: Four times a day (QID) | ORAL | Status: DC | PRN
  Administered 2015-10-17: 500 mg via ORAL
  Filled 2015-10-17: qty 5

## 2015-10-17 MED ORDER — ALTEPLASE 2 MG IJ SOLR
2.0000 mg | Freq: Once | INTRAMUSCULAR | Status: DC | PRN
  Filled 2015-10-17: qty 2

## 2015-10-17 NOTE — Progress Notes (Addendum)
Inpatient Diabetes Program Recommendations  AACE/ADA: New Consensus Statement on Inpatient Glycemic Control (2015)  Target Ranges:  Prepandial:   less than 140 mg/dL      Peak postprandial:   less than 180 mg/dL (1-2 hours)      Critically ill patients:  140 - 180 mg/dL   Consult received regarding patient's admission with hypoglycemia. Patient stated she did not remember how much 70/30 SSI she is supposed to take tidwc  Diabetes history: dm with renal failure Outpatient Diabetes medications: Novolog 70/30 @ 1-5 units tidwc, lantus 30 units in the am and 10 units at HS (reviewed chart review and 'care everywhere' where I found she takes 70/30 1-5 units tidwc- Current orders for Inpatient glycemic control: Lantus 10 units and sensitive correction tidwc  Inpatient Diabetes Program Recommendations: Have requested 70/30 home regimen of SSI tidwc-from each of the pharmacies listed as well as past chart reviews and care everywhere notes. Noted patient stated she thought she was to take 1 unit with glucose of 125 mg/dL, then she couldn't remember the next range dose.  With lantus at 30 units and 10 units per day, pt typically should not needs 70/30 mix three times per day-would seem more logical to use a novolog correction scale tidwc, i.e. The sensitive tidwc scale here and possibly the HS correction scale.  Typically a patient with ESRD does not take this much basal nor 70/30 as a SSI three times per day.  Agree with present orders of starting with lantus 10 units and sensitive correction as ordered. Will talk with patient hopefully to get a more clear picture of what the patient was told. [facility]  Ad-Spoke with patient in HD. Asked her what her insulin regimen is at home. Pt states she takes only takes lantus 10 units at HS and the Novolog 70/30 correction scale only once a day in the morning before breakfast..  She states she takes the  Novolog 70/30 mix according to the following SS: cbg of 100-107-1 unit, 107-114- 2 units, 115-122-3 units, that is 1 unit Novolog 70/30 mix for every 7-8 mg/dL greater than cbg of 161100 mg/dL. She states that lately she has not felt like eating, and she thinks her cbg yesterday am was 137 mg/dL which would have required 5 units. But she could not find her SSI instruction scale and gave 9 units.She then sat down to eat and remembers nothing following that time. Still feel like patient could use a novolog SSI at sensitive level tidwc and if eats to take 2-3 units meal coverage in addition. Patient states she could check her glucose 3-4 times a day and follow the instructions. Would think this would be a safer regimen than the novolog 70/30 mix once a day, especially considering that she states she has not had the appetite to eat. (the 70/30 requires that patient eats when peaking, etc). Will revisit patient in the am and review cbg's throughout the rest of today and tomorrow am. Explained to her that her regimen may be changed from that which she came in using. She is agreeable.  Thank you Lenor CoffinAnn Frederick Klinger, RN, MSN, CDE  Diabetes Inpatient Program Office: [phone] Pager: [phone] 8:00 am to 5:00 pm

## 2015-10-17 NOTE — Progress Notes (Signed)
Assessment/Plan: 1. Hypoglycemia: Per primary-was confused about S/S insulin instructions-took too much, was found down by daughter, EMS called. BS 47. Brought via EMS to [facility]. Last BS 133 2. ESRD - [facility]. For scheduled HD today.  3. Anemia - 11.6 follow CBC. No OP ESA 4. Secondary hyperparathyroidism -on calcium acetate binders, senispar. Will continue.  5. HTN/volume - OP EDW 75.5 kg, Current wt 78.5 kg, will attempt UFG 3 liters. BP elevated-takes Losartan 100 mg PO daily, Amlodipine 10 mg PO Q hs and Labetalol 300 mr PO BID.  6. Nutrition - change to Renal/Carb mod diet. Add renal vit.  7. Elevated Troponin: Minimally elevated troponin Per primary. 2D echo ordered.  8. DM: per primary  Rita H. Brown NP-C 10/17/2015, 8:45 AM  [facility] [phone]  Subjective: "I feel better now".   Apparently patient was confused at home about S/S insulin directions. States she has been developing "cold symptoms" and was nauseated. She took what she thought was appropriate insulin dose but became nauseated and was unable to eat. Was found down at home by daughter. Currently sitting on bedside, eating breakfast. Denies chest pain/SOB/nausea/vomiting/diarrhea.  Objective Filed Vitals:   10/16/15 2200 10/16/15 2309 10/17/15 0500 10/17/15 0843  BP: 192/72 182/77 174/66 163/79  Pulse: 104 87 83 80  Temp:  98.9 F (37.2 C) 98.8 F (37.1 C) 98.4 F (36.9 C)  TempSrc:  Oral Oral Oral  Resp: 25 20 18 20   Weight:  78.518 kg (173 lb 1.6 oz)    SpO2: 98% 98% 100% 100%   Physical Exam General: well nourished cooperative in NAD Heart: S1, S2, RRR. No JVD.  Lungs: Bilateral breath sounds CTA A/P Abdomen: active BS nontender,  Extremities: No LE edema.  Dialysis Access: L thigh AVG + bruit.  Dialysis Orders: TueThuSat, 4 hrs 0 min, 180NRe Optiflux, BFR 400, DFR Autoflow 1.5, EDW 75.5 (kg), Dialysate 2.0 K, 2.25 Ca, UFR Profile:  Profile 2, Sodium Model: Linear, Access: L thigh AV Graft-Standard  Heparin: 3000 units q treatment Venofer: 50 mg IV q weekly (last 10/12/2015)  Additional Objective Labs: Basic Metabolic Panel:  Last Labs      Recent Labs Lab 10/16/15 1742 10/17/15 0500  NA 140 136  K 4.6 4.3  CL 96* 92*  CO2 29 30  GLUCOSE 131* 133*  BUN 47* 53*  CREATININE 6.00* 6.67*  CALCIUM 9.0 8.5*     Liver Function Tests:  Last Labs      Recent Labs Lab 10/16/15 1742  AST 40  ALT 21  ALKPHOS 69  BILITOT 0.6  PROT 7.1  ALBUMIN 3.7      Last Labs     No results for input(s): LIPASE, AMYLASE in the last 168 hours.   CBC:  Last Labs      Recent Labs Lab 10/16/15 1742 10/17/15 0500  WBC 13.2* 8.7  NEUTROABS 12.0* --   HGB 14.3 11.6*  HCT 46.4* 37.2  MCV 90.8 89.6  PLT 328 364     Blood Culture  Labs (Brief)       Component Value Date/Time   SDES WOUND GRAFT ARM LEFT 10/08/2011 0912   SPECREQUEST LEFT ARTERIOVENOUS GORETEX GRAFT 10/08/2011 0912   CULT NO GROWTH 2 DAYS 10/08/2011 0912   REPTSTATUS 10/10/2011 FINAL 10/08/2011 0912      Cardiac Enzymes:  Last Labs      Recent Labs Lab 10/16/15 2357 10/17/15 0500  TROPONINI 0.22* 0.19*     CBG:  Last  Labs      Recent Labs Lab 10/16/15 1632 10/16/15 1803 10/16/15 2138 10/16/15 2206 10/16/15 2350  GLUCAP 162* 82 63* 83 195*     Iron Studies:    Recent Labs (last 2 labs)     No results for input(s): IRON, TIBC, TRANSFERRIN, FERRITIN in the last 72 hours.   @ Studies/Results:  Imaging Results (Last 48 hours)    Ct Head Wo Contrast  10/16/2015 CLINICAL DATA: Transient episode of altered mental status today. Hypoglycemia. Initial encounter. EXAM: CT HEAD WITHOUT CONTRAST TECHNIQUE: Contiguous axial images were obtained from the base of the skull through the vertex without intravenous contrast.  COMPARISON: Head CT scan 08/15/2010. FINDINGS: There is cortical atrophy. Prominence of the ventricular system is unchanged. No evidence of acute intracranial abnormality including hemorrhage, infarct, mass lesion, mass effect, midline shift or abnormal extra-axial fluid collection is seen. There is no hydrocephalus or pneumocephalus. The calvarium is intact. Imaged paranasal sinuses and mastoid air cells are clear. IMPRESSION: No acute abnormality. The cortical atrophy. Electronically Signed By: Drusilla Kanner M.D. On: 10/16/2015 20:00    Medications:   . amLODipine 10 mg Oral QHS  . aspirin EC 81 mg Oral QHS  . calcium acetate 1,334 mg Oral TID WC  . cinacalcet 60 mg Oral QHS  . fenofibrate 160 mg Oral Daily  . heparin 5,000 Units Subcutaneous 3 times per day  . insulin aspart 0-9 Units Subcutaneous TID WC  . insulin glargine 10 Units Subcutaneous QHS  . labetalol 300 mg Oral BID  . latanoprost 1 drop Both Eyes QHS  . losartan 100 mg Oral Daily                     I have seen and examined this patient and agree with plan as outlined by Alonna Buckler, NP.  Pt is hopefully going to be discharged tomorrow.  Patient was seen on dialysis and the procedure was supervised. BFR 400 Via L thigh AVG BP is 191/92.  Patient appears to be tolerating treatment well.  Marland Kitchen Parris Signer A,MD 10/17/2015 12:53 PM

## 2015-10-17 NOTE — Progress Notes (Deleted)
[facility] Progress Note  Assessment/Plan: 1. Hypoglycemia: Per primary-was confused about S/S insulin instructions-took too much, was found down by daughter, EMS called. BS 47. Last BS 133 2. [facility]. For scheduled HD today.  3. Anemia - 11.6 follow CBC. No OP ESA 4. Secondary hyperparathyroidism -on calcium acetate binders, senispar. Will continue.  5. HTN/volume - OP EDW 75.5 kg, Current wt 78.5 kg, will attempt UFG 3 liters. BP elevated-takes Losartan 100 mg PO daily, Amlodipine 10 mg PO Q hs and Labetalol 300 mr PO BID.  6. Nutrition - change to Renal/Carb mod diet. Add renal vit.  7. Elevated Troponin: Minimally elevated troponin  Per primary. 2D echo ordered.   Roslin Norwood H. Cordia Miklos NP-C 10/17/2015, 8:45 AM  [facility] [phone]  Subjective: "I feel better now". Apparently patient confused about S/S insulin directions. States she has been developing "cold symptoms" and was nauseated. She took what she thought was appropriate insulin dose but became nauseated and was unable to eat. Was found down at home by daughter. Currently sitting on bedside, eating breakfast. Denies chest pain/SOB/nausea/vomiting/diarrhea.  Objective Filed Vitals:   10/16/15 2200 10/16/15 2309 10/17/15 0500 10/17/15 0843  BP: 192/72 182/77 174/66 163/79  Pulse: 104 87 83 80  Temp:  98.9 F (37.2 C) 98.8 F (37.1 C) 98.4 F (36.9 C)  TempSrc:  Oral Oral Oral  Resp: 25 20 18 20   Weight:  78.518 kg (173 lb 1.6 oz)    SpO2: 98% 98% 100% 100%   Physical Exam General: well nourished cooperative in NAD Heart: S1, S2, RRR. No JVD.  Lungs: Bilateral breath sounds CTA A/P Abdomen: active BS nontender,  Extremities: No LE edema.  Dialysis Access: L thigh AVG + bruit.  Dialysis Orders: TueThuSat, 4 hrs 0 min, 180NRe Optiflux, BFR 400, DFR Autoflow 1.5, EDW 75.5 (kg), Dialysate 2.0 K, 2.25 Ca, UFR Profile: Profile 2, Sodium Model: Linear, Access: L thigh AV  Graft-Standard  Heparin: 3000 units q treatment Venofer: 50 mg IV q weekly (last 10/12/2015)  Additional Objective Labs: Basic Metabolic Panel:  Recent Labs Lab 10/16/15 1742 10/17/15 0500  NA 140 136  K 4.6 4.3  CL 96* 92*  CO2 29 30  GLUCOSE 131* 133*  BUN 47* 53*  CREATININE 6.00* 6.67*  CALCIUM 9.0 8.5*   Liver Function Tests:  Recent Labs Lab 10/16/15 1742  AST 40  ALT 21  ALKPHOS 69  BILITOT 0.6  PROT 7.1  ALBUMIN 3.7   No results for input(s): LIPASE, AMYLASE in the last 168 hours. CBC:  Recent Labs Lab 10/16/15 1742 10/17/15 0500  WBC 13.2* 8.7  NEUTROABS 12.0*  --   HGB 14.3 11.6*  HCT 46.4* 37.2  MCV 90.8 89.6  PLT 328 364   Blood Culture    Component Value Date/Time   SDES WOUND GRAFT ARM LEFT 10/08/2011 0912   SPECREQUEST LEFT ARTERIOVENOUS GORETEX GRAFT 10/08/2011 0912   CULT NO GROWTH 2 DAYS 10/08/2011 0912   REPTSTATUS 10/10/2011 FINAL 10/08/2011 0912    Cardiac Enzymes:  Recent Labs Lab 10/16/15 2357 10/17/15 0500  TROPONINI 0.22* 0.19*   CBG:  Recent Labs Lab 10/16/15 1632 10/16/15 1803 10/16/15 2138 10/16/15 2206 10/16/15 2350  GLUCAP 162* 82 63* 83 195*   Iron Studies: No results for input(s): IRON, TIBC, TRANSFERRIN, FERRITIN in the last 72 hours. @lablastinr3 @ Studies/Results: Ct Head Wo Contrast  10/16/2015  CLINICAL DATA:  Transient episode of altered mental status today. Hypoglycemia. Initial encounter. EXAM:  CT HEAD WITHOUT CONTRAST TECHNIQUE: Contiguous axial images were obtained from the base of the skull through the vertex without intravenous contrast. COMPARISON:  Head CT scan 08/15/2010. FINDINGS: There is cortical atrophy. Prominence of the ventricular system is unchanged. No evidence of acute intracranial abnormality including hemorrhage, infarct, mass lesion, mass effect, midline shift or abnormal extra-axial fluid collection is seen. There is no hydrocephalus or pneumocephalus. The calvarium is intact.  Imaged paranasal sinuses and mastoid air cells are clear. IMPRESSION: No acute abnormality.  The cortical atrophy. Electronically Signed   By: Drusilla Kanner M.D.   On: 10/16/2015 20:00   Medications:   . amLODipine  10 mg Oral QHS  . aspirin EC  81 mg Oral QHS  . calcium acetate  1,334 mg Oral TID WC  . cinacalcet  60 mg Oral QHS  . fenofibrate  160 mg Oral Daily  . heparin  5,000 Units Subcutaneous 3 times per day  . insulin aspart  0-9 Units Subcutaneous TID WC  . insulin glargine  10 Units Subcutaneous QHS  . labetalol  300 mg Oral BID  . latanoprost  1 drop Both Eyes QHS  . losartan  100 mg Oral Daily

## 2015-10-17 NOTE — Evaluation (Signed)
Physical Therapy Evaluation & Discharge Patient Details Name: Colleen Mullins MRN: [id number] DOB: [birth year] Today's Date: 10/17/2015   History of Present Illness  Pt is a 69 yo female admitted for accidental overdose of insulin.  Pt with elevated troponin level on admit but it is trending down.  Pt with ESRD and gets dialyisis.  Clinical Impression  Patient close to functional baseline, but admits to good days and bad days.  Takes transportation to HD and can use lift if needed.  Admits daughter looking at yard sale for 4 wheeled walker.  Feel she would benefit from 4 wheeled walker with a seat to allow rest breaks due to knee pain as well as general weakness especially after dialysis.  Patient currently close to functional baseline so no further skilled PT needs at this time.      Follow Up Recommendations No PT follow up    Equipment Recommendations  Rolling walker with 5" wheels (rollator walker 4 wheels and seat)    Recommendations for Other Services       Precautions / Restrictions Precautions Precautions: Fall      Mobility  Bed Mobility Overal bed mobility: Independent                Transfers Overall transfer level: Modified independent Equipment used: Straight cane             General transfer comment: steady with transfer  Ambulation/Gait Ambulation/Gait assistance: Supervision;Min guard Ambulation Distance (Feet): 100 Feet (and 150) Assistive device: Straight cane Gait Pattern/deviations: Step-through pattern;Decreased stride length     General Gait Details: mildly unsteady with ambulation, better walking back to room after practicing stairs, uses cane appropriately for decreased fall risk.  Seated rest prior to negotiating stairs due to reports knees start to bother her  Stairs Stairs: Yes Stairs assistance: Supervision Stair Management: Step to pattern;Forwards;One rail Right;With cane Number of Stairs: 3 General stair comments: assist for  safety  Wheelchair Mobility    Modified Rankin (Stroke Patients Only)       Balance Overall balance assessment: Needs assistance         Standing balance support: No upper extremity supported Standing balance-Leahy Scale: Fair Standing balance comment: static balance without UE support, for ambulation reaches for items in room or uses cane                             Pertinent Vitals/Pain Faces Pain Scale: Hurts little more Pain Location: knees with ambulation Pain Descriptors / Indicators: Aching Pain Intervention(s): Monitored during session;Limited activity within patient's tolerance    Home Living Family/patient expects to be discharged to:: Private residence Living Arrangements: Children Available Help at Discharge: Family;Available PRN/intermittently Type of Home: Apartment Home Access: Stairs to enter Entrance Stairs-Rails: Right Entrance Stairs-Number of Steps: 3 Home Layout: One level Home Equipment: Cane - single point      Prior Function Level of Independence: Independent with assistive device(s)         Comments: uses cane outside      Hand Dominance   Dominant Hand: Right    Extremity/Trunk Assessment   Upper Extremity Assessment: Defer to OT evaluation           Lower Extremity Assessment: Generalized weakness         Communication   Communication: No difficulties  Cognition Arousal/Alertness: Awake/alert Behavior During Therapy: WFL for tasks assessed/performed Overall Cognitive Status: Within Functional Limits for tasks assessed  General Comments      Exercises        Assessment/Plan    PT Assessment Patent does not need any further PT services  PT Diagnosis Generalized weakness   PT Problem List    PT Treatment Interventions     PT Goals (Current goals can be found in the Care Plan section) Acute Rehab PT Goals PT Goal Formulation: All assessment and education complete, DC  therapy    Frequency     Barriers to discharge        Co-evaluation               End of Session   Activity Tolerance: Patient tolerated treatment well Patient left: in bed;with call bell/phone within reach      Functional Assessment Tool Used: Clinical Judgement Functional Limitation: Mobility: Walking and moving around Mobility: Walking and Moving Around Current Status ([id number](G8978): At least 1 percent but less than 20 percent impaired, limited or restricted Mobility: Walking and Moving Around Goal Status [phone]([id number]): At least 1 percent but less than 20 percent impaired, limited or restricted Mobility: Walking and Moving Around Discharge Status [phone]([phone]): At least 1 percent but less than 20 percent impaired, limited or restricted    Time: [id number] PT Time Calculation (min) (ACUTE ONLY): 15 min   Charges:         PT G Codes:   PT G-Codes **NOT FOR INPATIENT CLASS** Functional Assessment Tool Used: Clinical Judgement Functional Limitation: Mobility: Walking and moving around Mobility: Walking and Moving Around Current Status ([id number](G8978): At least 1 percent but less than 20 percent impaired, limited or restricted Mobility: Walking and Moving Around Goal Status [phone]([phone]): At least 1 percent but less than 20 percent impaired, limited or restricted Mobility: Walking and Moving Around Discharge Status [phone]([phone]): At least 1 percent but less than 20 percent impaired, limited or restricted    [facility] CenterWYNN,CYNDI 10/17/2015, 5:05 PM  [place], [place] [phone] 10/17/2015

## 2015-10-17 NOTE — Evaluation (Signed)
Occupational Therapy Evaluation and Discharge Summary Patient Details Name: Gladiola Madore MRN: [id number] DOB: 1946/11/04 Today's Date: 10/17/2015    History of Present Illness Pt is a 69 yo female admitted for accidental overdose of insulin.  Pt with elevated troponin level on admit but it is trending down.  Pt with ESRD and gets dialyisis.   Clinical Impression   Pt admitted with the above diagnosis and overall has returned very close to baseline.  Pt has a cold and feels under the weather but can complete all adls and mobilize as she always does.  Pt does use cane at home and does not have it here.  Pt fell a year ago and has had R shoulder pain ever since.  Her old insurance did not cover therapy for this shoulder.  She now has new insurance.  Pt is significantly limited in this shoulder and could use some therapy to explore what is going on.  Pt states xray was negative.  No acute OT needs at this time.    Follow Up Recommendations  Outpatient OT;Other (comment) (OPOT for shoulder)    Equipment Recommendations  Other (comment)    Recommendations for Other Services       Precautions / Restrictions Restrictions Weight Bearing Restrictions: No      Mobility Bed Mobility Overal bed mobility: Independent             General bed mobility comments: no assist needed.  Transfers Overall transfer level: Modified independent Equipment used: None             General transfer comment: Pt uses cane sometimes inside but most of the time outside.    Balance Overall balance assessment: No apparent balance deficits (not formally assessed)                                          ADL Overall ADL's : At baseline                                       General ADL Comments: Pt appears to have returned close to or at baseline for her adls and mobility.     Vision     Perception     Praxis      Pertinent Vitals/Pain Pain Assessment:  Faces Pain Score: 6  Faces Pain Scale: Hurts even more Pain Location: R shoulder Pain Descriptors / Indicators: Aching;Sharp Pain Intervention(s): Limited activity within patient's tolerance;Monitored during session;Repositioned     Hand Dominance Right   Extremity/Trunk Assessment Upper Extremity Assessment Upper Extremity Assessment: RUE deficits/detail RUE Deficits / Details: AROM:  shoulder flexion 90, abduction 90. Otherwise WFL RUE: Unable to fully assess due to pain RUE Coordination: decreased gross motor   Lower Extremity Assessment Lower Extremity Assessment: Overall WFL for tasks assessed   Cervical / Trunk Assessment Cervical / Trunk Assessment: Normal   Communication Communication Communication: No difficulties   Cognition Arousal/Alertness: Awake/alert Behavior During Therapy: WFL for tasks assessed/performed Overall Cognitive Status: Within Functional Limits for tasks assessed                     General Comments       Exercises       Shoulder Instructions      Home Living Family/patient expects to be  discharged to:: Private residence Living Arrangements: Children Available Help at Discharge: Family;Available PRN/intermittently Type of Home: Apartment Home Access: Stairs to enter Entrance Stairs-Number of Steps: 3   Home Layout: One level     Bathroom Shower/Tub: Tub/shower unit;Curtain Shower/tub characteristics: Engineer, building servicesCurtain Bathroom Toilet: Standard     Home Equipment: Cane - single point          Prior Functioning/Environment Level of Independence: Independent with assistive device(s)        Comments: uses cane outside     OT Diagnosis: Acute pain   OT Problem List: Decreased range of motion;Pain   OT Treatment/Interventions:      OT Goals(Current goals can be found in the care plan section) Acute Rehab OT Goals Patient Stated Goal: to go home tomorrow. OT Goal Formulation: All assessment and education complete, DC  therapy  OT Frequency:     Barriers to D/C:            Co-evaluation              End of Session Nurse Communication: Mobility status  Activity Tolerance: Patient tolerated treatment well Patient left: in chair;with call bell/phone within reach   Time: 1035-1055 OT Time Calculation (min): 20 min Charges:  OT General Charges $OT Visit: 1 Procedure OT Evaluation $Initial OT Evaluation Tier I: 1 Procedure G-Codes: OT G-codes **NOT FOR INPATIENT CLASS** Functional Assessment Tool Used: clinical judgement Functional Limitation: Self care Self Care Current Status ([id number](G8987): At least 1 percent but less than 20 percent impaired, limited or restricted Self Care Goal Status ([id number](G8988): At least 1 percent but less than 20 percent impaired, limited or restricted Self Care Discharge Status [phone]([phone]): At least 1 percent but less than 20 percent impaired, limited or restricted  Hope BuddsJones, Athalia Setterlund Anne 10/17/2015, 11:02 AM [phone]

## 2015-10-17 NOTE — Progress Notes (Signed)
[facility] PROGRESS NOTE  Rennie PlowmanDianne Berrey ZOX:[id number] DOB: 01/16/1946 DOA: 10/16/2015  PCP: Dan MakerR,RICHARD L, MD  Brief HPI: 69 year old African American female with a past medical history of end-stage renal disease on dialysis, insulin-dependent diabetes and hypertension, presented with low blood sugars. Apparently she took more insulin than she should have. Patient was also noted to have mildly elevated troponin levels. She was hospitalized for further management.  Past medical history:  Past Medical History  Diagnosis Date  . Diabetes mellitus   . Hypertension   . Hyperlipidemia   . GERD (gastroesophageal reflux disease)   . Arthritis   . H/O: Bell's palsy   . Chronic kidney disease   . Dialysis patient [facility](HCC)     M-W-F @ [facility]  . Ejection fraction     Consultants: Nephrology  Procedures:   Hemodialysis  2-D echocardiogram is pending  Antibiotics: None  Subjective: Patient feels better this morning. She states that she took more insulin than she should have. She denies any chest pain. She has had heartburn all through the night. However, this is a chronic symptom for her.  Objective: Vital Signs  Filed Vitals:   10/16/15 2154 10/16/15 2200 10/16/15 2309 10/17/15 0500  BP: 207/74 192/72 182/77 174/66  Pulse: 82 104 87 83  Temp:   98.9 F (37.2 C) 98.8 F (37.1 C)  TempSrc:   Oral Oral  Resp: 21 25 20 18   Weight:   78.518 kg (173 lb 1.6 oz)   SpO2: 98% 98% 98% 100%    Intake/Output Summary (Last 24 hours) at 10/17/15 0802 Last data filed at 10/17/15 0200  Gross per 24 hour  Intake      0 ml  Output      0 ml  Net      0 ml   Filed Weights   10/16/15 2309  Weight: 78.518 kg (173 lb 1.6 oz)    General appearance: alert, cooperative, appears stated age and no distress Resp: clear to auscultation bilaterally Cardio: regular rate and rhythm, S1, S2 normal, no murmur, click, rub or gallop GI: soft, non-tender; bowel sounds normal; no masses,   no organomegaly Extremities: extremities normal, atraumatic, no cyanosis or edema Neurologic: Awake and alert. Oriented 3. No focal neurological deficits are noted.  Lab Results:  Basic Metabolic Panel:  Recent Labs Lab 10/16/15 1742 10/17/15 0500  NA 140 136  K 4.6 4.3  CL 96* 92*  CO2 29 30  GLUCOSE 131* 133*  BUN 47* 53*  CREATININE 6.00* 6.67*  CALCIUM 9.0 8.5*   Liver Function Tests:  Recent Labs Lab 10/16/15 1742  AST 40  ALT 21  ALKPHOS 69  BILITOT 0.6  PROT 7.1  ALBUMIN 3.7   CBC:  Recent Labs Lab 10/16/15 1742 10/17/15 0500  WBC 13.2* 8.7  NEUTROABS 12.0*  --   HGB 14.3 11.6*  HCT 46.4* 37.2  MCV 90.8 89.6  PLT 328 364   Cardiac Enzymes:  Recent Labs Lab 10/16/15 2357 10/17/15 0500  TROPONINI 0.22* 0.19*   CBG:  Recent Labs Lab 10/16/15 1632 10/16/15 1803 10/16/15 2138 10/16/15 2206 10/16/15 2350  GLUCAP 162* 82 63* 83 195*    No results found for this or any previous visit (from the past 240 hour(s)).    Studies/Results: Ct Head Wo Contrast  10/16/2015  CLINICAL DATA:  Transient episode of altered mental status today. Hypoglycemia. Initial encounter. EXAM: CT HEAD WITHOUT CONTRAST TECHNIQUE: Contiguous axial images were obtained from the base of the  skull through the vertex without intravenous contrast. COMPARISON:  Head CT scan 08/15/2010. FINDINGS: There is cortical atrophy. Prominence of the ventricular system is unchanged. No evidence of acute intracranial abnormality including hemorrhage, infarct, mass lesion, mass effect, midline shift or abnormal extra-axial fluid collection is seen. There is no hydrocephalus or pneumocephalus. The calvarium is intact. Imaged paranasal sinuses and mastoid air cells are clear. IMPRESSION: No acute abnormality.  The cortical atrophy. Electronically Signed   By: Drusilla Kanner M.D.   On: 10/16/2015 20:00    Medications:  Scheduled: . amLODipine  10 mg Oral QHS  . aspirin EC  81 mg Oral QHS   . calcium acetate  1,334 mg Oral TID WC  . cinacalcet  60 mg Oral QHS  . fenofibrate  160 mg Oral Daily  . heparin  5,000 Units Subcutaneous 3 times per day  . insulin aspart  0-9 Units Subcutaneous TID WC  . insulin glargine  10 Units Subcutaneous QHS  . labetalol  300 mg Oral BID  . latanoprost  1 drop Both Eyes QHS  . losartan  100 mg Oral Daily  . multivitamin  1 tablet Oral QHS  . pantoprazole  40 mg Oral QPM   Continuous:  UJW:JXBJYN chloride, sodium chloride, alteplase, calcium carbonate, heparin, hydrALAZINE, lidocaine (PF), lidocaine-prilocaine, pentafluoroprop-tetrafluoroeth  Assessment/Plan:  Principal Problem:   Hypoglycemia Active Problems:   Type 2 diabetes mellitus with renal complication (HCC)   End stage renal disease (HCC)   Elevated troponin   Hypertensive urgency    Hypoglycemia in patient with IDDM Most likely due to inadvertently taking higher dose of insulin than she was supposed to. Patient understands. She had that she may have made a mistake. Blood glucose levels have rebounded. Continue to monitor for an additional day. Resume her Lantus at a lower dose. HbA1c is pending.   Leukocytosis Now normal.Suspect this was reactive.No evidence of infection at this time.  Elevated troponin This is likely poor clearance in this patient with ESRD. Patient denies any chest pain. No shortness of breath. She does have symptoms of GERD, which is chronic for her. EKG doesn't show anything concerning in terms of ischemia. Previous records were reviewed. She underwent a stress test in 2014 , which was a low-risk stress test. At this time. Continue to trend troponin. We will order 2-D echocardiogram. The echo does not show anything concerning she will most likely not require any further testing.  ESRD on HD TThS Consult to nephrology for maintenance dialysis  Hypertensive urgency Resume home medications. Blood pressure is poorly controlled. Hydralazine as needed.  Continue to monitor closely. She has not been compliant with her home medication regimen. She tells me that dose of one of her blood pressure medicine was increased last week.   DVT Prophylaxis: subCutaneous heparin    Code Status: DO NOT RESUSCITATE  Family Communication: Discussed with the patient  Disposition Plan: Mobilize. Dialysis today. Await echocardiogram.      [facility]  [facility] Pager [phone] 10/17/2015, 8:02 AM  If 7PM-7AM, please contact night-coverage at [email or web address], password [facility]

## 2015-10-18 ENCOUNTER — Observation Stay (HOSPITAL_BASED_OUTPATIENT_CLINIC_OR_DEPARTMENT_OTHER): Payer: Medicare HMO

## 2015-10-18 DIAGNOSIS — R011 Cardiac murmur, unspecified: Secondary | ICD-10-CM

## 2015-10-18 DIAGNOSIS — E1122 Type 2 diabetes mellitus with diabetic chronic kidney disease: Secondary | ICD-10-CM | POA: Diagnosis not present

## 2015-10-18 DIAGNOSIS — D72829 Elevated white blood cell count, unspecified: Secondary | ICD-10-CM | POA: Diagnosis not present

## 2015-10-18 DIAGNOSIS — E162 Hypoglycemia, unspecified: Secondary | ICD-10-CM | POA: Diagnosis not present

## 2015-10-18 DIAGNOSIS — N186 End stage renal disease: Secondary | ICD-10-CM | POA: Diagnosis not present

## 2015-10-18 LAB — BASIC METABOLIC PANEL
Anion gap: 12 (ref 5–15)
BUN: 32 mg/dL — AB (ref 6–20)
CHLORIDE: 95 mmol/L — AB (ref 101–111)
CO2: 29 mmol/L (ref 22–32)
CREATININE: 4.41 mg/dL — AB (ref 0.44–1.00)
Calcium: 8.2 mg/dL — ABNORMAL LOW (ref 8.9–10.3)
GFR calc Af Amer: 11 mL/min — ABNORMAL LOW (ref >60)
GFR calc non Af Amer: 9 mL/min — ABNORMAL LOW (ref >60)
Glucose, Bld: 105 mg/dL — ABNORMAL HIGH (ref 65–99)
Potassium: 4.1 mmol/L (ref 3.5–5.1)
Sodium: 136 mmol/L (ref 135–145)

## 2015-10-18 LAB — CBC
HEMATOCRIT: 39 % (ref 36.0–46.0)
HEMOGLOBIN: 12.3 g/dL (ref 12.0–15.0)
MCH: 28.5 pg (ref 26.0–34.0)
MCHC: 31.5 g/dL (ref 30.0–36.0)
MCV: 90.3 fL (ref 78.0–100.0)
Platelets: 351 10*3/uL (ref 150–400)
RBC: 4.32 MIL/uL (ref 3.87–5.11)
RDW: 17.3 % — ABNORMAL HIGH (ref 11.5–15.5)
WBC: 8.4 10*3/uL (ref 4.0–10.5)

## 2015-10-18 LAB — GLUCOSE, CAPILLARY
GLUCOSE-CAPILLARY: 116 mg/dL — AB (ref 65–99)
GLUCOSE-CAPILLARY: 89 mg/dL (ref 65–99)
GLUCOSE-CAPILLARY: 105 mg/dL — AB (ref 65–99)
Glucose-Capillary: 65 mg/dL (ref 65–99)
Glucose-Capillary: 94 mg/dL (ref 65–99)

## 2015-10-18 LAB — HEMOGLOBIN A1C
HEMOGLOBIN A1C: 5.3 % (ref 4.8–5.6)
MEAN PLASMA GLUCOSE: 105 mg/dL

## 2015-10-18 MED ORDER — INSULIN ASPART 100 UNIT/ML ~~LOC~~ SOLN: 0.0000 [IU] | Freq: Three times a day (TID) | SUBCUTANEOUS | Status: DC

## 2015-10-18 NOTE — Progress Notes (Signed)
Inpatient Diabetes Program Recommendations  AACE/ADA: New Consensus Statement on Inpatient Glycemic Control (2015)  Target Ranges:  Prepandial:   less than 140 mg/dL      Peak postprandial:   less than 180 mg/dL (1-2 hours)      Critically ill patients:  140 - 180 mg/dL   Review of Glycemic Control  Current orders for Inpatient glycemic control: Lantus 10 units QHS, Novolog Sensitive  Inpatient Diabetes Program Recommendations: Insulin - Basal: Note hypoglycemia this am of 65 mg/dl. Please reduce basal insulin slightly to 8 units QHS.  Consider sending patient home on Lantus 8 and Novolog Sensitive correction to prevent hypoglycemia.  Thanks,  Christena DeemShannon Aadhira Heffernan RN, MSN, [facility] Inpatient Diabetes Coordinator Team Pager [phone] (8a-5p)

## 2015-10-18 NOTE — Progress Notes (Signed)
  Echocardiogram 2D Echocardiogram has been performed.  Janalyn HarderWest, Kieran Arreguin R 10/18/2015, 9:23 AM

## 2015-10-18 NOTE — Care Management Obs Status (Signed)
MEDICARE OBSERVATION STATUS NOTIFICATION   Patient Details  Name: Colleen PlowmanDianne Roache MRN: [id number] Date of Birth: 01/21/1946   Medicare Observation Status Notification Given:  Yes    [place] Festa, Annamarie MajorCheryl U, RN 10/18/2015, 11:26 AM

## 2015-10-18 NOTE — Discharge Summary (Signed)
Physician Discharge Summary  Colleen Mullins ZOX:[id number] DOB: June 02, 1946 DOA: 10/16/2015  PCP: Dan Maker, MD  Admit date: 10/16/2015 Discharge date: 10/18/2015  Time spent: 35 minutes  Recommendations for Outpatient Follow-up:  1. Please follow up on Colleen Mullins blood sugars, she was admitted for hypoglycemia likely related to confusion regarding her sliding scale coverage. Her Lantus and Insulin 70/30 were stopped. She was discharged sliding-scale NovoLog. A sliding scale algorithm was printed and handed to her on discharge   Discharge Diagnoses:  Principal Problem:   Hypoglycemia Active Problems:   Type 2 diabetes mellitus with renal complication (HCC)   End stage renal disease (HCC)   Elevated troponin   Hypertensive urgency   Discharge Condition: Stable  Diet recommendation: Carb Mod Diet  Filed Weights   10/17/15 1130 10/17/15 1543 10/17/15 2035  Weight: 77.9 kg (171 lb 11.8 oz) 74 kg (163 lb 2.3 oz) 75.567 kg (166 lb 9.5 oz)    History of present illness:  Colleen Mullins is a 69 y.o. female with a past medical history significant for ESRD on HD TThS, IDDM, and HTN who presents with hypoglycemia.  The patient recently moved in with her daughter and lost her sliding scale insulin instructions. This morning, she got up and her BG was 127 mg/dL, she knew she was supposed to give 1 unit Novolog for BG 100-125, and so she thought she should give 10 units for 127, although she is able to state now that this didn't seem right at the time. Then she went to eat an english muffin, but only took two bites before feeling nauseated and stopping. She subsequently developed sweats, shakiness, and clouded thinking. She remembers her grandson asking her for bus fare before she lay down and passed out. Some time later her daughter found her "in the closet" passed out and called EMS.   EMS found the patient with BG 47 mg/dL and gave W11 and glucagon.  Hospital Course:  69 year old  [place] American female with a past medical history of end-stage renal disease on dialysis, insulin-dependent diabetes and hypertension, presented with low blood sugars. She reported losing her sliding scale algorithm and gave herself 10 units of Insulin 70/30 to correct a BS of 127. She presented with hypoglycemia having blood sugar of 47. She had also been on Lantus. Her blood sugars improved with the administration of dextrose. Her Lantus was restarted at 10 units subcutaneous daily during this hospitalization however on AM of  10/18/2015, blood sugars fell to 65. Because of this basal insulin was continued on discharge. She was discharged on sliding scale coverage 3 times a day with NovoLog. On discharge she was given sliding scale insulin algorithm for sensitive coverage. This follow-up on her blood sugars.   Consultations: Nephrology  Discharge Exam: Filed Vitals:   10/18/15 0410 10/18/15 0808  BP: 176/81 165/74  Pulse: 76 77  Temp: 98.4 F (36.9 C) 98.3 F (36.8 C)  Resp: 17 16    General appearance: alert, cooperative, appears stated age and no distress Resp: clear to auscultation bilaterally Cardio: regular rate and rhythm, S1, S2 normal, no murmur, click, rub or gallop GI: soft, non-tender; bowel sounds normal; no masses, no organomegaly Extremities: extremities normal, atraumatic, no cyanosis or edema Neurologic: Awake and alert. Oriented 3. No focal neurological deficits are noted.  Discharge Instructions   Discharge Instructions    Call MD for:  difficulty breathing, headache or visual disturbances    Complete by:  As directed  Call MD for:  extreme fatigue    Complete by:  As directed      Call MD for:  hives    Complete by:  As directed      Call MD for:  persistant dizziness or light-headedness    Complete by:  As directed      Call MD for:  persistant nausea and vomiting    Complete by:  As directed      Call MD for:  redness, tenderness, or signs of  infection (pain, swelling, redness, odor or green/yellow discharge around incision site)    Complete by:  As directed      Call MD for:  severe uncontrolled pain    Complete by:  As directed      Call MD for:  temperature >100.4    Complete by:  As directed      Call MD for:    Complete by:  As directed      Diet - low sodium heart healthy    Complete by:  As directed      Increase activity slowly    Complete by:  As directed           Current Discharge Medication List    START taking these medications   Details  insulin aspart (NOVOLOG) 100 UNIT/ML injection Inject 0-9 Units into the skin 3 (three) times daily with meals. Qty: 10 mL, Refills: 11      CONTINUE these medications which have NOT CHANGED   Details  acetaminophen (TYLENOL) 325 MG tablet Take 650 mg by mouth every 6 (six) hours as needed for moderate pain.    amLODipine (NORVASC) 10 MG tablet Take 10 mg by mouth at bedtime.     aspirin EC 81 MG tablet Take 81 mg by mouth at bedtime.     Ca Carbonate-Mag Hydroxide (ROLAIDS) 550-110 MG CHEW Chew 1-2 tablets by mouth daily as needed (for heartburn).    calcium acetate (PHOSLO) 667 MG capsule Take 1,334 mg by mouth 3 (three) times daily with meals. Takes 2 caps with meals and 1 cap with snacks    cinacalcet (SENSIPAR) 60 MG tablet Take 60 mg by mouth at bedtime.     fenofibrate (TRICOR) 145 MG tablet Take 145 mg by mouth every evening.     fish oil-omega-3 fatty acids 1000 MG capsule Take 1 g by mouth at bedtime.     labetalol (NORMODYNE) 300 MG tablet Take 300 mg by mouth 2 (two) times daily.      losartan (COZAAR) 50 MG tablet Take 50 mg by mouth daily.    multivitamin (RENA-VIT) TABS tablet Take 1 tablet by mouth daily.      Travoprost, BAK Free, (TRAVATAN) 0.004 % SOLN ophthalmic solution Place 1 drop into both eyes at bedtime.      STOP taking these medications     insulin aspart protamine-insulin aspart (NOVOLOG 70/30) (70-30) 100 UNIT/ML injection       insulin glargine (LANTUS) 100 UNIT/ML injection        Allergies  Allergen Reactions  . Lactose Intolerance (Gi) Other (See Comments)    Abdominal pains and bloating      The results of significant diagnostics from this hospitalization (including imaging, microbiology, ancillary and laboratory) are listed below for reference.    Significant Diagnostic Studies: Ct Head Wo Contrast  10/16/2015  CLINICAL DATA:  Transient episode of altered mental status today. Hypoglycemia. Initial encounter. EXAM: CT HEAD WITHOUT CONTRAST TECHNIQUE: Contiguous axial  images were obtained from the base of the skull through the vertex without intravenous contrast. COMPARISON:  Head CT scan 08/15/2010. FINDINGS: There is cortical atrophy. Prominence of the ventricular system is unchanged. No evidence of acute intracranial abnormality including hemorrhage, infarct, mass lesion, mass effect, midline shift or abnormal extra-axial fluid collection is seen. There is no hydrocephalus or pneumocephalus. The calvarium is intact. Imaged paranasal sinuses and mastoid air cells are clear. IMPRESSION: No acute abnormality.  The cortical atrophy. Electronically Signed   By: Drusilla Kanner M.D.   On: 10/16/2015 20:00    Microbiology: No results found for this or any previous visit (from the past 240 hour(s)).   Labs: Basic Metabolic Panel:  Recent Labs Lab 10/16/15 1742 10/17/15 0500 10/17/15 1130 10/18/15 0522  NA 140 136 135 136  K 4.6 4.3 4.8 4.1  CL 96* 92* 93* 95*  CO2 GLUCOSE 131* 133* 120* 105*  BUN 47* 53* 55* 32*  CREATININE 6.00* 6.67* 6.83* 4.41*  CALCIUM 9.0 8.5* 8.5* 8.2*  PHOS  --   --  5.4*  --    Liver Function Tests:  Recent Labs Lab 10/16/15 1742 10/17/15 1130  AST 40  --   ALT 21  --   ALKPHOS 69  --   BILITOT 0.6  --   PROT 7.1  --   ALBUMIN 3.7 3.2*   No results for input(s): LIPASE, AMYLASE in the last 168 hours. No results for input(s): AMMONIA in the last  168 hours. CBC:  Recent Labs Lab 10/16/15 1742 10/17/15 0500 10/17/15 1130 10/18/15 0522  WBC 13.2* 8.7 7.9 8.4  NEUTROABS 12.0*  --   --   --   HGB 14.3 11.6* 11.7* 12.3  HCT 46.4* 37.2 36.3 39.0  MCV 90.8 89.6 88.8 90.3  PLT 328 364 353 351   Cardiac Enzymes:  Recent Labs Lab 10/16/15 2357 10/17/15 0500 10/17/15 1035  TROPONINI 0.22* 0.19* 0.19*   BNP: BNP (last 3 results) No results for input(s): BNP in the last 8760 hours.  ProBNP (last 3 results) No results for input(s): PROBNP in the last 8760 hours.  CBG:  Recent Labs Lab 10/17/15 2034 10/18/15 0312 10/18/15 0409 10/18/15 0807 10/18/15 1133  GLUCAP 250* 65 116* 89 105*       Signed:  Synia Douglass  [facility] 10/18/2015, 1:21 PM

## 2015-10-18 NOTE — Clinical Social Work Note (Signed)
Patient medically stable for discharge and requesting assistant with transport. Patient provided with cab voucher for transport home.   Colleen Mullins, MSW, LCSW Licensed Clinical Social Worker Clinical Social Work Department [facility] [phone]

## 2015-10-18 NOTE — Progress Notes (Signed)
Patient ID: Rennie PlowmanDianne Ventola, female   DOB: 05/04/1946, 69 y.o.   MRN: [id number] Ms. Jenean LindauRhodes is stable for discharge from renal standpoint and can follow up with outpatient HD unit on Friday per holiday schedule.

## 2016-02-01 ENCOUNTER — Encounter: Payer: Self-pay | Admitting: Cardiology

## 2016-02-17 ENCOUNTER — Emergency Department (HOSPITAL_COMMUNITY)
Admission: EM | Admit: 2016-02-17 | Discharge: 2016-02-17 | Disposition: A | Discharge status: Home / Self Care | Payer: Medicare HMO | Attending: Emergency Medicine | Admitting: Emergency Medicine

## 2016-02-17 ENCOUNTER — Encounter (HOSPITAL_COMMUNITY): Payer: Self-pay

## 2016-02-17 DIAGNOSIS — Z7982 Long term (current) use of aspirin: Secondary | ICD-10-CM | POA: Diagnosis not present

## 2016-02-17 DIAGNOSIS — T82838A Hemorrhage of vascular prosthetic devices, implants and grafts, initial encounter: Secondary | ICD-10-CM | POA: Diagnosis present

## 2016-02-17 DIAGNOSIS — Z794 Long term (current) use of insulin: Secondary | ICD-10-CM | POA: Diagnosis not present

## 2016-02-17 DIAGNOSIS — Z8669 Personal history of other diseases of the nervous system and sense organs: Secondary | ICD-10-CM | POA: Diagnosis not present

## 2016-02-17 DIAGNOSIS — K219 Gastro-esophageal reflux disease without esophagitis: Secondary | ICD-10-CM | POA: Insufficient documentation

## 2016-02-17 DIAGNOSIS — Y658 Other specified misadventures during surgical and medical care: Secondary | ICD-10-CM | POA: Insufficient documentation

## 2016-02-17 DIAGNOSIS — Z992 Dependence on renal dialysis: Secondary | ICD-10-CM | POA: Insufficient documentation

## 2016-02-17 DIAGNOSIS — Z79899 Other long term (current) drug therapy: Secondary | ICD-10-CM | POA: Insufficient documentation

## 2016-02-17 DIAGNOSIS — Z87891 Personal history of nicotine dependence: Secondary | ICD-10-CM | POA: Diagnosis not present

## 2016-02-17 DIAGNOSIS — E785 Hyperlipidemia, unspecified: Secondary | ICD-10-CM | POA: Insufficient documentation

## 2016-02-17 DIAGNOSIS — M1990 Unspecified osteoarthritis, unspecified site: Secondary | ICD-10-CM | POA: Diagnosis not present

## 2016-02-17 DIAGNOSIS — E119 Type 2 diabetes mellitus without complications: Secondary | ICD-10-CM | POA: Insufficient documentation

## 2016-02-17 DIAGNOSIS — N186 End stage renal disease: Secondary | ICD-10-CM | POA: Insufficient documentation

## 2016-02-17 DIAGNOSIS — I120 Hypertensive chronic kidney disease with stage 5 chronic kidney disease or end stage renal disease: Secondary | ICD-10-CM | POA: Diagnosis not present

## 2016-02-17 DIAGNOSIS — T829XXA Unspecified complication of cardiac and vascular prosthetic device, implant and graft, initial encounter: Secondary | ICD-10-CM

## 2016-02-17 LAB — CBC
HCT: 37.7 % (ref 36.0–46.0)
Hemoglobin: 11.9 g/dL — ABNORMAL LOW (ref 12.0–15.0)
MCH: 27.7 pg (ref 26.0–34.0)
MCHC: 31.6 g/dL (ref 30.0–36.0)
MCV: 87.9 fL (ref 78.0–100.0)
PLATELETS: 326 10*3/uL (ref 150–400)
RBC: 4.29 MIL/uL (ref 3.87–5.11)
RDW: 16.3 % — AB (ref 11.5–15.5)
WBC: 6.7 10*3/uL (ref 4.0–10.5)

## 2016-02-17 LAB — I-STAT CHEM 8, ED
BUN: 39 mg/dL — ABNORMAL HIGH (ref 6–20)
Calcium, Ion: 0.93 mmol/L — ABNORMAL LOW (ref 1.13–1.30)
Chloride: 93 mmol/L — ABNORMAL LOW (ref 101–111)
Creatinine, Ser: 5.5 mg/dL — ABNORMAL HIGH (ref 0.44–1.00)
Glucose, Bld: 113 mg/dL — ABNORMAL HIGH (ref 65–99)
HCT: 41 % (ref 36.0–46.0)
Hemoglobin: 13.9 g/dL (ref 12.0–15.0)
Potassium: 4.1 mmol/L (ref 3.5–5.1)
Sodium: 138 mmol/L (ref 135–145)
TCO2: 30 mmol/L (ref 0–100)

## 2016-02-17 LAB — CBG MONITORING, ED: GLUCOSE-CAPILLARY: 106 mg/dL — AB (ref 65–99)

## 2016-02-17 MED ORDER — ACETAMINOPHEN 325 MG PO TABS
650.0000 mg | ORAL_TABLET | Freq: Once | ORAL | Status: AC
  Administered 2016-02-17: 650 mg via ORAL
  Filled 2016-02-17: qty 2

## 2016-02-17 MED ORDER — LIDOCAINE-EPINEPHRINE 1 %-1:100000 IJ SOLN
10.0000 mL | Freq: Once | INTRAMUSCULAR | Status: AC
  Administered 2016-02-17: 10 mL via INTRADERMAL
  Filled 2016-02-17: qty 1

## 2016-02-17 NOTE — ED Provider Notes (Signed)
Patient reports that she went to dialysis today and her dialysis graft could not be accessed. She is presently asymptomatic. She was told to come to the emergency department. She complains of losing blood from dialysis graft site. On exam patient is alert no distress lungs clear auscultation left lower extremity with dialysis graft using tiny amount of blood. Good thrill. All extremities without redness swelling or tenderness neurovascularly intact  Doug SouSam Emberlee Sortino, MD 02/18/16 0020

## 2016-02-17 NOTE — ED Provider Notes (Signed)
CSN: [id number]     Arrival date & time 02/17/16  1459 History   First MD Initiated Contact with Patient 02/17/16 1554     Chief Complaint  Patient presents with  . Vascular Access Problem     HPI  70 y.o. female with history of ESRD, on dialysis at [facility] on Tuesday Thursday Saturday, DM 2, HTN, HLD, who presents with oozing from her left femoral graft site for the last 24 hours. Additionally, the patient reports that her dialysis clinic has had difficulty accessing the graft and has had to stick her multiple times for her last 3 sessions, but have ultimately been successful. The graft is been in place for 5 years and was placed at [facility]. She has, however, seen vascular surgery here where her prior dialysis fistulas were placed back in 2012. Her last complete dialysis session was 2 days ago. She reports that she has had slow oozing from the puncture site over the last 24 hours, soaking through several layers of gauze. Denies lightheadedness. She is not on blood thinners. Does take 81 mg of aspirin daily. Today when she went for dialysis as scheduled, they saw the slow oozing from the site and sent her to the emergency department for further evaluation. She denies pain in the area.   Past Medical History  Diagnosis Date  . Diabetes mellitus   . Hypertension   . Hyperlipidemia   . GERD (gastroesophageal reflux disease)   . Arthritis   . H/O: Bell's palsy   . Chronic kidney disease   . Dialysis patient [facility](HCC)     M-W-F @ [facility]  . Ejection fraction    Past Surgical History  Procedure Laterality Date  . Tonsillectomy    . Btl    . Arteriovenous graft placement      Left forearm x 2, one removed in March  . Arteriovenous graft placement      Left forearm  . Laparotomy      Pelvic mass (Benign)  . Avgg removal  10/08/2011    Procedure: REMOVAL OF ARTERIOVENOUS GORETEX GRAFT (AVGG);  Surgeon: Sherren Kernsharles E Fields, MD;  [facility];  Service: Vascular;  Laterality: Left;   . Abdominal hysterectomy     Family History  Problem Relation Age of Onset  . Diabetes Mother   . Heart disease Mother   . COPD Father   . Hypertension Father    Social History  Substance Use Topics  . Smoking status: Former Smoker    Types: Cigarettes    Quit date: 08/04/1999  . Smokeless tobacco: Never Used  . Alcohol Use: No   OB History    No data available     Review of Systems  Constitutional: Negative for fever, chills, activity change and appetite change.  HENT: Negative for congestion, rhinorrhea and sore throat.   Eyes: Negative for visual disturbance.  Respiratory: Negative for cough, shortness of breath and wheezing.   Cardiovascular: Negative for chest pain and palpitations.  Gastrointestinal: Negative for nausea, vomiting, abdominal pain and abdominal distention.  Genitourinary: Negative for flank pain and pelvic pain.  Musculoskeletal: Negative for myalgias, back pain, joint swelling, arthralgias, gait problem, neck pain and neck stiffness.  Skin: Negative for rash.  Neurological: Negative for dizziness, tremors, syncope, facial asymmetry, speech difficulty, weakness, numbness and headaches.  Hematological:       Bleeding from dialysis graft site  Psychiatric/Behavioral: Negative for behavioral problems, confusion and agitation.      Allergies  Lactose  intolerance (gi)  Home Medications   Prior to Admission medications   Medication Sig Start Date End Date Taking? Authorizing Provider  acetaminophen (TYLENOL) 325 MG tablet Take 650 mg by mouth every 6 (six) hours as needed for moderate pain.   Yes Historical Provider, MD  acetaminophen (TYLENOL) 500 MG tablet Take 2,000 mg by mouth daily as needed for moderate pain.   Yes Historical Provider, MD  amLODipine (NORVASC) 10 MG tablet Take 10 mg by mouth at bedtime.    Yes Historical Provider, MD  aspirin EC 81 MG tablet Take 81 mg by mouth at bedtime.    Yes Historical Provider, MD  bisacodyl (DULCOLAX) 5  MG EC tablet Take 5 mg by mouth daily as needed for moderate constipation.   Yes Historical Provider, MD  bismuth subsalicylate (KAOPECTATE) 262 MG/15ML suspension Take 30 mLs by mouth every 6 (six) hours as needed for indigestion or diarrhea or loose stools.   Yes Historical Provider, MD  calcium acetate (PHOSLO) 667 MG capsule Take 1,334 mg by mouth 3 (three) times daily with meals. Takes 2 caps with meals and 1 cap with snacks   Yes Historical Provider, MD  Chlorpheniramine-APAP (CORICIDIN) 2-325 MG TABS Take 2 tablets by mouth daily as needed (for cold).   Yes Historical Provider, MD  cinacalcet (SENSIPAR) 60 MG tablet Take 60 mg by mouth at bedtime.    Yes Historical Provider, MD  fenofibrate (TRICOR) 145 MG tablet Take 145 mg by mouth every evening.    Yes Historical Provider, MD  fish oil-omega-3 fatty acids 1000 MG capsule Take 1 g by mouth at bedtime.    Yes Historical Provider, MD  insulin aspart (NOVOLOG) 100 UNIT/ML injection Inject 0-9 Units into the skin 3 (three) times daily with meals. 10/18/15  Yes Jeralyn Bennett, MD  labetalol (NORMODYNE) 300 MG tablet Take 300 mg by mouth 2 (two) times daily.     Yes Historical Provider, MD  losartan (COZAAR) 100 MG tablet Take 100 mg by mouth every evening.   Yes Historical Provider, MD  multivitamin (RENA-VIT) TABS tablet Take 1 tablet by mouth every evening.    Yes Historical Provider, MD  Travoprost, BAK Free, (TRAVATAN) 0.004 % SOLN ophthalmic solution Place 1 drop into both eyes at bedtime.   Yes Historical Provider, MD   BP 171/67 mmHg  Pulse 73  Temp(Src) 98.7 F (37.1 C) (Oral)  Resp 18  Wt 77.837 kg  SpO2 100% Physical Exam  Constitutional: She is oriented to person, place, and time. She appears well-developed and well-nourished. No distress.  HENT:  Head: Normocephalic and atraumatic.  Right Ear: External ear normal.  Left Ear: External ear normal.  Nose: Nose normal.  Mouth/Throat: Oropharynx is clear and moist. No  oropharyngeal exudate.  Eyes: Conjunctivae and EOM are normal. Pupils are equal, round, and reactive to light. Right eye exhibits no discharge. Left eye exhibits no discharge.  Neck: Normal range of motion. Neck supple.  Cardiovascular: Normal rate, regular rhythm and normal heart sounds.  Exam reveals no gallop and no friction rub.   No murmur heard. Slow oozing from 2 pin-point locations over the pt's L femoral dialysis graft site. Good palpable thrill. No underlying hematoma.   Pulmonary/Chest: Breath sounds normal. No respiratory distress. She has no wheezes. She has no rales.  Abdominal: Soft. Bowel sounds are normal. She exhibits no distension and no mass. There is no tenderness. There is no rebound and no guarding.  Musculoskeletal: Normal range of motion. She exhibits  no edema or tenderness.  Neurological: She is alert and oriented to person, place, and time. She exhibits normal muscle tone.  Skin: Skin is warm and dry. No rash noted. She is not diaphoretic.  Psychiatric: She has a normal mood and affect. Her behavior is normal. Judgment and thought content normal.    ED Course  .Marland KitchenLaceration Repair Date/Time: 02/18/2016 1:41 AM Performed by: Charlie Pitter Authorized by: Charlie Pitter Consent: Verbal consent obtained. Risks and benefits: risks, benefits and alternatives were discussed Consent given by: patient Body area: lower extremity (L femoral graft site, over areas of bleeding) Wound length (cm): pinpoint. Anesthesia: local infiltration Local anesthetic: lidocaine 1% with epinephrine Anesthetic total: 2 ml Wound skin closure material used: 4.0 chromic. Number of sutures: 2 Technique: simple Patient tolerance: Patient tolerated the procedure well with no immediate complications   (including critical care time) Labs Review Labs Reviewed  CBC - Abnormal; Notable for the following:    Hemoglobin 11.9 (*)    RDW 16.3 (*)    All other components within  normal limits  CBG MONITORING, ED - Abnormal; Notable for the following:    Glucose-Capillary 106 (*)    All other components within normal limits  I-STAT CHEM 8, ED - Abnormal; Notable for the following:    Chloride 93 (*)    BUN 39 (*)    Creatinine, Ser 5.50 (*)    Glucose, Bld 113 (*)    Calcium, Ion 0.93 (*)    All other components within normal limits    Imaging Review No results found. I have personally reviewed and evaluated these images and lab results as part of my medical decision-making.   EKG Interpretation None      MDM  Patient is generally well-appearing. Slow oozing from her left femoral graft site from 2 pinpoint locations as above. 2 simple sutures placed with hemostasis achieved. Good palpable thrill. Patient reports that dialysis has been able to access her graft site but that is been increasingly difficult. Recommend follow-up with vascular surgery in 2 days for further investigation into the difficulty of accessing the site, and return to the ED for inability to obtain vascular access or worsening bleeding.  Labs obtained that are not significant for hyperkalemia. Patient denies shortness of breath and there is no indication for acute dialysis at this time. I contacted nephrology, who will contact the patient at home if they are able to schedule her for an earlier catch-up dialysis session, but at this time they recommend follow-up at her next scheduled appointment in 3 days. Return precautions discussed. Patient stable for discharge home.   Final diagnoses:  Complication of vascular access for dialysis, initial encounter [facility])        [place]averik Foot Ernestina Penna, MD 02/18/16 0150  Doug Sou, MD 02/18/16 1459

## 2016-02-17 NOTE — Discharge Instructions (Signed)
Please call your vascular surgeon on Monday morning to discuss difficulty with accessing your dialysis graft during your dialysis sessions. Please make sure that you attend dialysis as scheduled on Tuesday. Your nephrologist may be calling you to schedule an earlier session if needed. Return to the Emergency Department for continued bleeding from your graft site or shortness of breath.

## 2016-02-17 NOTE — ED Notes (Addendum)
Pt. Was sent to us from dialysis due to her lt. Leg graft bleeding since last night.   They were unable to complete the HD treatment today.  Pt. Reports that the site is painful to touch.  She is alert and oriented X4.  The site has been ozzing since Thursday.  Marland Kitchen. She was at dialysis today and they applied a pressure dressing.

## 2016-02-20 ENCOUNTER — Other Ambulatory Visit: Payer: Self-pay | Admitting: Radiology

## 2016-02-20 ENCOUNTER — Ambulatory Visit (HOSPITAL_COMMUNITY)
Admission: RE | Admit: 2016-02-20 | Discharge: 2016-02-20 | Disposition: A | Discharge status: Home / Self Care | Payer: Medicare HMO | Source: Ambulatory Visit | Attending: Nephrology | Admitting: Nephrology

## 2016-02-20 ENCOUNTER — Other Ambulatory Visit (HOSPITAL_COMMUNITY): Payer: Self-pay | Admitting: Nephrology

## 2016-02-20 ENCOUNTER — Encounter (HOSPITAL_COMMUNITY): Payer: Self-pay

## 2016-02-20 DIAGNOSIS — M1990 Unspecified osteoarthritis, unspecified site: Secondary | ICD-10-CM | POA: Insufficient documentation

## 2016-02-20 DIAGNOSIS — E785 Hyperlipidemia, unspecified: Secondary | ICD-10-CM | POA: Insufficient documentation

## 2016-02-20 DIAGNOSIS — I120 Hypertensive chronic kidney disease with stage 5 chronic kidney disease or end stage renal disease: Secondary | ICD-10-CM | POA: Diagnosis not present

## 2016-02-20 DIAGNOSIS — Z7982 Long term (current) use of aspirin: Secondary | ICD-10-CM | POA: Diagnosis not present

## 2016-02-20 DIAGNOSIS — N186 End stage renal disease: Secondary | ICD-10-CM

## 2016-02-20 DIAGNOSIS — Z992 Dependence on renal dialysis: Principal | ICD-10-CM

## 2016-02-20 DIAGNOSIS — Z8249 Family history of ischemic heart disease and other diseases of the circulatory system: Secondary | ICD-10-CM | POA: Diagnosis not present

## 2016-02-20 DIAGNOSIS — Z794 Long term (current) use of insulin: Secondary | ICD-10-CM | POA: Diagnosis not present

## 2016-02-20 DIAGNOSIS — E1122 Type 2 diabetes mellitus with diabetic chronic kidney disease: Secondary | ICD-10-CM | POA: Diagnosis not present

## 2016-02-20 DIAGNOSIS — K219 Gastro-esophageal reflux disease without esophagitis: Secondary | ICD-10-CM | POA: Insufficient documentation

## 2016-02-20 DIAGNOSIS — Z87891 Personal history of nicotine dependence: Secondary | ICD-10-CM | POA: Diagnosis not present

## 2016-02-20 LAB — BASIC METABOLIC PANEL
ANION GAP: 20 — AB (ref 5–15)
BUN: 76 mg/dL — ABNORMAL HIGH (ref 6–20)
CHLORIDE: 95 mmol/L — AB (ref 101–111)
CO2: 26 mmol/L (ref 22–32)
Calcium: 8 mg/dL — ABNORMAL LOW (ref 8.9–10.3)
Creatinine, Ser: 8.69 mg/dL — ABNORMAL HIGH (ref 0.44–1.00)
GFR calc Af Amer: 5 mL/min — ABNORMAL LOW (ref >60)
GFR calc non Af Amer: 4 mL/min — ABNORMAL LOW (ref >60)
GLUCOSE: 97 mg/dL (ref 65–99)
POTASSIUM: 4.1 mmol/L (ref 3.5–5.1)
Sodium: 141 mmol/L (ref 135–145)

## 2016-02-20 LAB — CBC
HEMATOCRIT: 33.9 % — AB (ref 36.0–46.0)
HEMOGLOBIN: 10.8 g/dL — AB (ref 12.0–15.0)
MCH: 27.9 pg (ref 26.0–34.0)
MCHC: 31.9 g/dL (ref 30.0–36.0)
MCV: 87.6 fL (ref 78.0–100.0)
Platelets: 294 10*3/uL (ref 150–400)
RBC: 3.87 MIL/uL (ref 3.87–5.11)
RDW: 16.5 % — ABNORMAL HIGH (ref 11.5–15.5)
WBC: 7.2 10*3/uL (ref 4.0–10.5)

## 2016-02-20 LAB — APTT: aPTT: 33 seconds (ref 24–37)

## 2016-02-20 LAB — GLUCOSE, CAPILLARY: Glucose-Capillary: 95 mg/dL (ref 65–99)

## 2016-02-20 LAB — PROTIME-INR
INR: 1.05 (ref 0.00–1.49)
Prothrombin Time: 13.9 seconds (ref 11.6–15.2)

## 2016-02-20 MED ORDER — HEPARIN SODIUM (PORCINE) 1000 UNIT/ML IJ SOLN
INTRAMUSCULAR | Status: AC
  Filled 2016-02-20: qty 1

## 2016-02-20 MED ORDER — LIDOCAINE HCL 1 % IJ SOLN
INTRAMUSCULAR | Status: AC
  Filled 2016-02-20: qty 20

## 2016-02-20 MED ORDER — MIDAZOLAM HCL 2 MG/2ML IJ SOLN
INTRAMUSCULAR | Status: AC
  Filled 2016-02-20: qty 6

## 2016-02-20 MED ORDER — GELATIN ABSORBABLE 12-7 MM EX MISC
CUTANEOUS | Status: AC
  Filled 2016-02-20: qty 1

## 2016-02-20 MED ORDER — MIDAZOLAM HCL 2 MG/2ML IJ SOLN
INTRAMUSCULAR | Status: AC | PRN
  Administered 2016-02-20 (×2): 1 mg via INTRAVENOUS

## 2016-02-20 MED ORDER — SODIUM CHLORIDE 0.9 % IV SOLN: Freq: Once | INTRAVENOUS | Status: DC

## 2016-02-20 MED ORDER — FENTANYL CITRATE (PF) 100 MCG/2ML IJ SOLN
INTRAMUSCULAR | Status: AC
  Filled 2016-02-20: qty 4

## 2016-02-20 MED ORDER — CEFAZOLIN SODIUM-DEXTROSE 2-4 GM/100ML-% IV SOLN
2.0000 g | INTRAVENOUS | Status: AC
  Administered 2016-02-20: 2 g via INTRAVENOUS

## 2016-02-20 MED ORDER — CEFAZOLIN SODIUM-DEXTROSE 2-3 GM-% IV SOLR
INTRAVENOUS | Status: AC
  Filled 2016-02-20: qty 50

## 2016-02-20 MED ORDER — FENTANYL CITRATE (PF) 100 MCG/2ML IJ SOLN
INTRAMUSCULAR | Status: AC | PRN
  Administered 2016-02-20: 50 ug via INTRAVENOUS
  Administered 2016-02-20: 25 ug via INTRAVENOUS
  Administered 2016-02-20: 50 ug via INTRAVENOUS

## 2016-02-20 NOTE — Procedures (Signed)
Interventional Radiology Procedure Note  Procedure: Placement of a tunneled HD catheter via the right IJ.  Tip in RA and ready for use.   Complications: None  Estimated Blood Loss: 0 mL  Recommendations: - D/C home after recover - Double check hemostasis from catheter exit site  Signed,  Sterling BigHeath K. Ceylon Arenson, MD

## 2016-02-20 NOTE — Sedation Documentation (Signed)
Fentanyl IV given for intense pain from arthritis pain whil pt is waiting for procedure

## 2016-02-20 NOTE — Sedation Documentation (Signed)
Patient is resting comfortably. 

## 2016-02-20 NOTE — H&P (Signed)
Chief Complaint: Patient was seen in consultation today for placement of HD catheter  Referring Physician(s): Lin,James W  Supervising Physician: Malachy Moan  History of Present Illness: Colleen Mullins is a 70 y.o. female with ESRD. She normally dialyses on T-Th-Sat via a left thigh AVG. She had HD on Thurs and had some bleeding issues. Apparently it was still bleeding Saturday. She only got a limited session in and was sent to the ER for continued bleeding. The ER placed a stitch in it and sent her home. She apparently went to outpt center today and was told her AVG could not be used and that she needed to come to IR for placement of new Permcath with sedation. She has been NPO all day. PMHx, meds, labs, reviewed. She has had prior HD cath about 5 years ago. Daughter at bedside.  Past Medical History  Diagnosis Date  . Diabetes mellitus   . Hypertension   . Hyperlipidemia   . GERD (gastroesophageal reflux disease)   . Arthritis   . H/O: Bell's palsy   . Chronic kidney disease   . Dialysis patient [facility])     M-W-F @ [facility]  . Ejection fraction     Past Surgical History  Procedure Laterality Date  . Tonsillectomy    . Btl    . Arteriovenous graft placement      Left forearm x 2, one removed in March  . Arteriovenous graft placement      Left forearm  . Laparotomy      Pelvic mass (Benign)  . Avgg removal  10/08/2011    Procedure: REMOVAL OF ARTERIOVENOUS GORETEX GRAFT (AVGG);  Surgeon: Sherren Kerns, MD;  Location: [facility] OR;  Service: Vascular;  Laterality: Left;  . Abdominal hysterectomy      Allergies: Lactose intolerance (gi)  Medications: Prior to Admission medications   Medication Sig Start Date End Date Taking? Authorizing Provider  acetaminophen (TYLENOL) 500 MG tablet Take 2,000 mg by mouth daily as needed for moderate pain.   Yes Historical Provider, MD  amLODipine (NORVASC) 10 MG tablet Take 10 mg by mouth at bedtime.    Yes Historical  Provider, MD  aspirin EC 81 MG tablet Take 81 mg by mouth at bedtime.    Yes Historical Provider, MD  calcium acetate (PHOSLO) 667 MG capsule Take 1,334 mg by mouth 3 (three) times daily with meals. Takes 2 caps with meals and 1 cap with snacks   Yes Historical Provider, MD  cinacalcet (SENSIPAR) 60 MG tablet Take 60 mg by mouth at bedtime.    Yes Historical Provider, MD  fenofibrate (TRICOR) 145 MG tablet Take 145 mg by mouth every evening.    Yes Historical Provider, MD  fish oil-omega-3 fatty acids 1000 MG capsule Take 1 g by mouth at bedtime.    Yes Historical Provider, MD  insulin aspart (NOVOLOG) 100 UNIT/ML injection Inject 0-9 Units into the skin 3 (three) times daily with meals. 10/18/15  Yes Jeralyn Bennett, MD  labetalol (NORMODYNE) 300 MG tablet Take 300 mg by mouth 2 (two) times daily.     Yes Historical Provider, MD  losartan (COZAAR) 100 MG tablet Take 100 mg by mouth every evening.   Yes Historical Provider, MD  multivitamin (RENA-VIT) TABS tablet Take 1 tablet by mouth every evening.    Yes Historical Provider, MD  Travoprost, BAK Free, (TRAVATAN) 0.004 % SOLN ophthalmic solution Place 1 drop into both eyes at bedtime.   Yes Historical Provider, MD  acetaminophen (TYLENOL) 325 MG tablet Take 650 mg by mouth every 6 (six) hours as needed for moderate pain.    Historical Provider, MD  bisacodyl (DULCOLAX) 5 MG EC tablet Take 5 mg by mouth daily as needed for moderate constipation.    Historical Provider, MD  bismuth subsalicylate (KAOPECTATE) 262 MG/15ML suspension Take 30 mLs by mouth every 6 (six) hours as needed for indigestion or diarrhea or loose stools.    Historical Provider, MD  Chlorpheniramine-APAP (CORICIDIN) 2-325 MG TABS Take 2 tablets by mouth daily as needed (for cold).    Historical Provider, MD     Family History  Problem Relation Age of Onset  . Diabetes Mother   . Heart disease Mother   . COPD Father   . Hypertension Father     Social History   Social  History  . Marital Status: Widowed    Spouse Name: N/A  . Number of Children: N/A  . Years of Education: N/A   Social History Main Topics  . Smoking status: Former Smoker    Types: Cigarettes    Quit date: 08/04/1999  . Smokeless tobacco: Never Used  . Alcohol Use: No  . Drug Use: No  . Sexual Activity: Not Asked   Other Topics Concern  . None   Social History Narrative     Review of Systems: A 12 point ROS discussed and pertinent positives are indicated in the HPI above.  All other systems are negative.  Review of Systems  Vital Signs: BP 107/65 mmHg  Pulse 66  Temp(Src) 98.4 F (36.9 C) (Oral)  Resp 26  Ht 5\' 6"  (1.676 m)  Wt 170 lb (77.111 kg)  BMI 27.45 kg/m2  SpO2 100%  Physical Exam  Constitutional: She is oriented to person, place, and time. She appears well-developed and well-nourished.  She appears in distress and reports her arthritis is very bad.  HENT:  Head: Normocephalic.  Mouth/Throat: Oropharynx is clear and moist.  Neck: Normal range of motion. No tracheal deviation present.  Cardiovascular: Normal rate, regular rhythm and normal heart sounds.   Pulmonary/Chest: Effort normal and breath sounds normal. No respiratory distress.  Neurological: She is alert and oriented to person, place, and time.  Skin: Skin is warm and dry.  (L)thigh AVG noted.  Psychiatric: She has a normal mood and affect. Judgment normal.    Mallampati Score:  MD Evaluation Airway: WNL Heart: WNL Abdomen: WNL Chest/ Lungs: WNL ASA  Classification: 3 Mallampati/Airway Score: Two  Imaging: No results found.  Labs:  CBC:  Recent Labs  10/17/15 1130 10/18/15 0522 02/17/16 1727 02/17/16 1733 02/20/16 1248  WBC 7.9 8.4 6.7  --  7.2  HGB 11.7* 12.3 11.9* 13.9 10.8*  HCT 36.3 39.0 37.7 41.0 33.9*  PLT 353 351 326  --  294    COAGS:  Recent Labs  10/16/15 1742 02/20/16 1248  INR 1.03 1.05  APTT 40* 33    BMP:  Recent Labs  10/17/15 0500  10/17/15 1130 10/18/15 0522 02/17/16 1733 02/20/16 1248  NA 136 135 136 138 141  K 4.3 4.8 4.1 4.1 4.1  CL 92* 93* 95* 93* 95*  CO2 30 27 29   --  26  GLUCOSE 133* 120* 105* 113* 97  BUN 53* 55* 32* 39* 76*  CALCIUM 8.5* 8.5* 8.2*  --  8.0*  CREATININE 6.67* 6.83* 4.41* 5.50* 8.69*  GFRNONAA 6* 5* 9*  --  4*  GFRAA 7* 6* 11*  --  5*  LIVER FUNCTION TESTS:  Recent Labs  10/16/15 1742 10/17/15 1130  BILITOT 0.6  --   AST 40  --   ALT 21  --   ALKPHOS 69  --   PROT 7.1  --   ALBUMIN 3.7 3.2*    Assessment and Plan: ESRD Needs HD access. Plan Permcath placement Procedure, risks, complications, use of sedation. Labs reviewed, ok Consent signed in chart  Thank you for this interesting consult.  I greatly enjoyed meeting Taleigh Gero and look forward to participating in their care.  A copy of this report was sent to the requesting provider on this date.  Electronically Signed: Brayton El 02/20/2016, 2:21 PM   I spent a total of 20 minutes in face to face in clinical consultation, greater than 50% of which was counseling/coordinating care for dialysis catheter placement

## 2016-02-20 NOTE — Discharge Instructions (Signed)
, Care After °Refer to this sheet in the next few weeks. These instructions provide you with information on caring for yourself after your procedure. Your caregiver may also give you more specific instructions. Your treatment has been planned according to current medical practices, but problems sometimes occur. Call your caregiver if you have any problems or questions after your procedure.  °HOME CARE INSTRUCTIONS °· Rest at home the day of the procedure. You will likely be able to return to normal activities the following day. °· Follow your caregiver's specific instructions for the type of device that you have. °· Only take over-the-counter or prescription medicines as directed by your caregiver. °· Keep the insertion site of the catheter clean and dry at all times. °¨ Change the bandages (dressings) over the catheter site as directed by your caregiver. °¨ Wash the area around the catheter site during each dressing change. Sponge bathe the area using a germ-killing (antiseptic) solution as directed by your caregiver. °¨ Look for redness or swelling at the insertion site during each dressing change. °· Apply an antibiotic ointment as directed by your caregiver. °· Flush your catheter as directed to keep it from becoming clogged. °· Always wash your hands thoroughly before changing dressings or flushing the catheter. °· Do not let air enter the catheter. °¨ Never open the cap at the catheter tip. °¨ Always make sure there is no air in the syringe or in the tubing for infusions.    °· Do not lift anything heavy. °· Do not drive until your caregiver approves. °· Do not shower or bathe until your caregiver approves. When you shower or bathe, place a piece of plastic wrap over the catheter site. Do not allow the catheter site or the dressing to get wet. If taking a bath, do not allow the catheter to get submerged in the water. °If the catheter was inserted through an arm vein:  °· Avoid wearing tight clothes or jewelry  on the arm that has the catheter.   °· Do not sleep with your head on the arm that has the catheter.   °· Do not allow use of a blood pressure cuff on the arm that has the catheter.   °· Do not let anyone draw blood from the arm that has the catheter, except through the catheter itself. °SEEK MEDICAL CARE IF: °· You have bleeding at the insertion site of the catheter.   °· You feel weak or nauseous.   °· Your catheter is not working properly.   °· You have redness, pain, swelling, and warmth at the insertion site.   °· You notice fluid draining from the insertion site.   °SEEK IMMEDIATE MEDICAL CARE IF: °· Your catheter breaks or has a hole in it.   °· Your catheter comes loose or gets pulled completely out. If this happens, hold firm pressure over the area with your hand or a clean cloth.   °· You have a fever. °· You have chills.   °· Your catheter becomes totally blocked.   °· You have swelling in your arm, shoulder, neck, or face.   °· You have bleeding from the insertion site that does not stop.   °· You develop chest pain or have trouble breathing.   °· You feel dizzy or faint.   °MAKE SURE YOU: °· Understand these instructions. °· Will watch your condition. °· Will get help right away if you are not doing well or get worse. °  °This information is not intended to replace advice given to you by your health care provider. Make sure you discuss any questions you   have with your health care provider. °  °Document Released: 10/28/2012 Document Revised: 07/14/2013 Document Reviewed: 10/28/2012 °Elsevier Interactive Patient Education ©2016 [employer] ° °

## 2016-02-23 ENCOUNTER — Encounter: Payer: Self-pay | Admitting: Vascular Surgery

## 2016-03-01 ENCOUNTER — Ambulatory Visit: Payer: Medicare HMO | Admitting: Vascular Surgery

## 2016-03-06 ENCOUNTER — Encounter: Payer: Self-pay | Admitting: Vascular Surgery

## 2016-03-15 ENCOUNTER — Ambulatory Visit (INDEPENDENT_AMBULATORY_CARE_PROVIDER_SITE_OTHER): Payer: Medicare HMO | Admitting: Vascular Surgery

## 2016-03-15 ENCOUNTER — Encounter: Payer: Self-pay | Admitting: Vascular Surgery

## 2016-03-15 VITALS — BP 150/70 | HR 70 | Temp 99.0°F | Resp 16 | Ht 66.0 in | Wt 170.0 lb

## 2016-03-15 DIAGNOSIS — N186 End stage renal disease: Secondary | ICD-10-CM

## 2016-03-15 DIAGNOSIS — T82898A Other specified complication of vascular prosthetic devices, implants and grafts, initial encounter: Secondary | ICD-10-CM | POA: Diagnosis not present

## 2016-03-15 DIAGNOSIS — T82511A Breakdown (mechanical) of surgically created arteriovenous shunt, initial encounter: Secondary | ICD-10-CM | POA: Insufficient documentation

## 2016-03-15 DIAGNOSIS — Z992 Dependence on renal dialysis: Secondary | ICD-10-CM | POA: Diagnosis not present

## 2016-03-15 HISTORY — DX: Breakdown (mechanical) of surgically created arteriovenous shunt, initial encounter: T82.511A

## 2016-03-15 NOTE — Progress Notes (Signed)
HISTORY AND PHYSICAL     CC:  Graft for dialysis needs to be checked Referring Provider:  Dan Makerrr, Richard L., MD  HPI: This is a 70 y.o. female who presented to the ED on 02/17/16 for bleeding from her left thigh graft.  She states that morning, she woke up and and had blood on her pajamas. She cleaned up in the shower, placed a band-aid on the area and went to dialysis.  In the waiting area, the band-aid became saturated and 4x4 gauze was placed on the area.  By the time she was called back for her dialysis, that dressing had saturated.  She continued to have oozing from the graft.  She was sent to the ER where 2 simple sutures were placed with hemostasis achieved.  She presents today for evaluation of her graft.  She states that it is still a little painful to touch.  There has been no further bleeding from the graft.  She did have a left tunneled HD catheter placed in the right IJ on 02/20/16 by IR.  She states that the machine alarms every time she is on HD now with the catheter.   She has a hx of LUA AVG in the past that was removed in 2012 by Dr. Darrick PennaFields.  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  She has been using her left thigh graft, [facility] without any difficulty until recently for 5 years.   She is diabetic and takes insulin.  She is on an ARB, CCB, and BB for blood pressure management.  She takes a daily aspirin.    Past Medical History  Diagnosis Date  . Diabetes mellitus   . Hypertension   . Hyperlipidemia   . GERD (gastroesophageal reflux disease)   . Arthritis   . H/O: Bell's palsy   . Chronic kidney disease   . Dialysis patient [facility](HCC)     M-W-F @ [facility]  . Ejection fraction     Past Surgical History  Procedure Laterality Date  . Tonsillectomy    . Btl    . Arteriovenous graft placement      Left forearm x 2, one removed in  March  . Arteriovenous graft placement      Left forearm  . Laparotomy      Pelvic mass (Benign)  . Avgg removal  10/08/2011    Procedure: REMOVAL OF ARTERIOVENOUS GORETEX GRAFT (AVGG);  Surgeon: Sherren Kernsharles E Fields, MD;  [facility];  Service: Vascular;  Laterality: Left;  . Abdominal hysterectomy      Allergies  Allergen Reactions  . Lactose Intolerance (Gi) Other (See Comments)    Abdominal pains and bloating    Current Outpatient Prescriptions  Medication Sig Dispense Refill  . acetaminophen (TYLENOL) 325 MG tablet Take 650 mg by mouth every 6 (six) hours as needed for moderate pain.    Marland Kitchen. acetaminophen (TYLENOL) 500 MG tablet Take 2,000 mg by mouth daily as needed for moderate pain.    Marland Kitchen. amLODipine (NORVASC) 10 MG tablet Take 10 mg by mouth at bedtime.     Marland Kitchen. aspirin EC 81 MG tablet Take 81 mg by mouth at bedtime.     . bisacodyl (DULCOLAX) 5 MG EC tablet Take 5 mg by mouth daily as needed for moderate constipation.    . bismuth subsalicylate (KAOPECTATE)  262 MG/15ML suspension Take 30 mLs by mouth every 6 (six) hours as needed for indigestion or diarrhea or loose stools.    . calcium acetate (PHOSLO) 667 MG capsule Take 1,334 mg by mouth 3 (three) times daily with meals. Takes 2 caps with meals and 1 cap with snacks    . Chlorpheniramine-APAP (CORICIDIN) 2-325 MG TABS Take 2 tablets by mouth daily as needed (for cold).    . cinacalcet (SENSIPAR) 60 MG tablet Take 60 mg by mouth at bedtime.     . fenofibrate (TRICOR) 145 MG tablet Take 145 mg by mouth every evening.     . fish oil-omega-3 fatty acids 1000 MG capsule Take 1 g by mouth at bedtime.     . insulin aspart (NOVOLOG) 100 UNIT/ML injection Inject 0-9 Units into the skin 3 (three) times daily with meals. 10 mL 11  . labetalol (NORMODYNE) 300 MG tablet Take 300 mg by mouth 2 (two) times daily.      Marland Kitchen. losartan (COZAAR) 100 MG tablet Take 100 mg by mouth every evening.    . multivitamin (RENA-VIT) TABS tablet Take 1 tablet by  mouth every evening.     . Travoprost, BAK Free, (TRAVATAN) 0.004 % SOLN ophthalmic solution Place 1 drop into both eyes at bedtime.     No current facility-administered medications for this visit.    Family History  Problem Relation Age of Onset  . Diabetes Mother   . Heart disease Mother   . COPD Father   . Hypertension Father     Social History   Social History  . Marital Status: Widowed    Spouse Name: N/A  . Number of Children: N/A  . Years of Education: N/A   Occupational History  . Not on file.   Social History Main Topics  . Smoking status: Former Smoker    Types: Cigarettes    Quit date: 08/04/1999  . Smokeless tobacco: Never Used  . Alcohol Use: No  . Drug Use: No  . Sexual Activity: Not on file   Other Topics Concern  . Not on file   Social History Narrative     REVIEW OF SYSTEMS:   [X]  denotes positive finding, [ ]  denotes negative finding Cardiac  Comments:  Chest pain or chest pressure:    Shortness of breath upon exertion:    Short of breath when lying flat:    Irregular heart rhythm:        Vascular    Pain in calf, thigh, or hip brought on by ambulation:    Pain in feet at night that wakes you up from your sleep:     Blood clot in your veins:    Leg swelling:         Pulmonary    Oxygen at home:    Productive cough:     Wheezing:         Neurologic    Sudden weakness in arms or legs:     Sudden numbness in arms or legs:     Sudden onset of difficulty speaking or slurred speech:    Temporary loss of vision in one eye:     Problems with dizziness:         Gastrointestinal    Blood in stool:     Vomited blood:         Genitourinary    Burning when urinating:     Blood in urine:        Psychiatric  Major depression:         Hematologic    Bleeding problems: x Bleeding from left thigh graft  Problems with blood clotting too easily:        Skin    Rashes or ulcers: x       Constitutional    Fever or chills:       PHYSICAL EXAMINATION:  Filed Vitals:   03/15/16 1512  BP: 150/70  Pulse: 70  Temp: 99 F (37.2 C)  Resp: 16   Body mass index is 27.45 kg/(m^2).  General:  WDWN in NAD; vital signs documented above Gait: Not observed HENT: WNL, normocephalic Pulmonary: normal non-labored breathing , without Rales, rhonchi,  wheezing Cardiac: regular HR, without  Murmurs, rubs or gallops; without carotid bruits Abdomen: soft, NT, no masses Skin: without rashes Vascular Exam/Pulses:  Right Left  Radial 2+ (normal) 2+ (normal)  Ulnar Unable to palpate  Unable to palpate   DP 1+ (weak) 1+ (weak)  PT Unable to palpate Unable to palpate    Extremities: without ischemic changes, without Gangrene , without cellulitis; without open wounds; Left thigh graft with +thrill/bruit; 2 pseudoaneurysms present laterally and inferior/medially.  There is color change of the skin and the skin is thin.   Musculoskeletal: no muscle wasting or atrophy  Neurologic: A&O X 3;  No focal weakness or paresthesias are detected Psychiatric:  The pt has Normal affect. Lymph : No Cervical, Axillary, or Inguinal lymphadenopathy    Non-Invasive Vascular Imaging:   None today  Pt meds includes: Statin:  No. Beta Blocker:  Yes.   Aspirin:  Yes.   ACEI:  No. ARB:  Yes.   Other Antiplatelet/Anticoagulant:  No.    ASSESSMENT/PLAN:: 70 y.o. female with ESRD with recent bleeding from her left thigh AVG and recent placement of right IJ TDC by IR on 02/20/16.  She is here for evaluation of her left thigh graft.   -she has been using her left thigh graft for 5 years without difficulty until recently when she had some bleeding and required two sutures in the ER.  She has since had a TDC placed in the right IJ, which is not functioning correctly on HD.  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 -Dr. Imogene Burn has suggested surgical revision on Mar 27, 2016 with repositioning or  possible exchange of her dialysis catheter at that time since it is not functioning correctly at HD.  Pt understands and agrees to proceed. -she understands that her left thigh graft will not be able to be accessed for 4 weeks after surgery.  After the graft has been used 2-3 times successfully, her catheter will be able to be removed.  -she has had a left AVG removed from her left arm in 2012.   -she has never had access in the right arm at her request. -if her left thigh graft becomes non usable, she may have further options in her upper extremities in the future.     Doreatha Massed, PA-C [facility] [phone]  Clinic MD:  Pt seen and examined in conjunction with Dr. Imogene Burn   Addendum  I have independently interviewed and examined the patient, and I agree with the physician assistant's findings.  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  She already has a TDC in place which apparently has poor flow rates.  Will plan  on exchange of the RIJV TDC and revision of the left thigh AVG with a new segment spanning from the non-aneurysmal  arterial limb to the non-aneurysmal venous limb.  Leonides Sake, MD [facility] [place] Office: [phone] Pager: [phone]  03/15/2016, 5:35 PM

## 2016-03-18 ENCOUNTER — Other Ambulatory Visit: Payer: Self-pay

## 2016-03-26 ENCOUNTER — Encounter (HOSPITAL_COMMUNITY): Payer: Self-pay | Admitting: *Deleted

## 2016-03-26 NOTE — Progress Notes (Signed)
Pt denies cardiac history, chest pain or sob. Will get sob with walking. Pt is diabetic, last A1C on 03/21/16 was 7.2. States her fasting blood sugar usually run around 98-110. Instructed pt to check her blood sugar every 2 hours after waking up in AM until she leaves for the hospital. If blood sugar is >220 take 1/2 of usual correction dose of Novolog insulin. If blood sugar is 70 or below, treat with 1/2 cup of clear juice (apple or cranberry) and recheck blood sugar 15 minutes after drinking juice. If blood sugar continues to be 70 or below, call the Short Stay department and ask to speak to a nurse. She voiced understanding. These instructions given per Diabetes Medication Adjustment Guidelines Prior to Procedure and Surgery.

## 2016-03-27 ENCOUNTER — Ambulatory Visit (HOSPITAL_COMMUNITY): Payer: Medicare HMO

## 2016-03-27 ENCOUNTER — Ambulatory Visit (HOSPITAL_COMMUNITY)
Admission: RE | Admit: 2016-03-27 | Discharge: 2016-03-27 | Disposition: A | Discharge status: Home / Self Care | Payer: Medicare HMO | Source: Ambulatory Visit | Attending: Vascular Surgery | Admitting: Vascular Surgery

## 2016-03-27 ENCOUNTER — Ambulatory Visit (HOSPITAL_COMMUNITY): Payer: Medicare HMO | Admitting: Certified Registered"

## 2016-03-27 ENCOUNTER — Encounter (HOSPITAL_COMMUNITY)
Admission: RE | Disposition: A | Discharge status: Home / Self Care | Payer: Self-pay | Source: Ambulatory Visit | Attending: Vascular Surgery

## 2016-03-27 ENCOUNTER — Encounter (HOSPITAL_COMMUNITY): Payer: Self-pay | Admitting: Certified Registered"

## 2016-03-27 DIAGNOSIS — E785 Hyperlipidemia, unspecified: Secondary | ICD-10-CM | POA: Insufficient documentation

## 2016-03-27 DIAGNOSIS — T82898A Other specified complication of vascular prosthetic devices, implants and grafts, initial encounter: Secondary | ICD-10-CM | POA: Diagnosis not present

## 2016-03-27 DIAGNOSIS — N186 End stage renal disease: Secondary | ICD-10-CM

## 2016-03-27 DIAGNOSIS — Z87891 Personal history of nicotine dependence: Secondary | ICD-10-CM | POA: Insufficient documentation

## 2016-03-27 DIAGNOSIS — I120 Hypertensive chronic kidney disease with stage 5 chronic kidney disease or end stage renal disease: Secondary | ICD-10-CM | POA: Diagnosis present

## 2016-03-27 DIAGNOSIS — Z794 Long term (current) use of insulin: Secondary | ICD-10-CM | POA: Diagnosis not present

## 2016-03-27 DIAGNOSIS — Z95828 Presence of other vascular implants and grafts: Secondary | ICD-10-CM

## 2016-03-27 DIAGNOSIS — E1122 Type 2 diabetes mellitus with diabetic chronic kidney disease: Secondary | ICD-10-CM | POA: Diagnosis not present

## 2016-03-27 DIAGNOSIS — Z7982 Long term (current) use of aspirin: Secondary | ICD-10-CM | POA: Insufficient documentation

## 2016-03-27 DIAGNOSIS — Z992 Dependence on renal dialysis: Secondary | ICD-10-CM | POA: Insufficient documentation

## 2016-03-27 DIAGNOSIS — Z419 Encounter for procedure for purposes other than remedying health state, unspecified: Secondary | ICD-10-CM

## 2016-03-27 HISTORY — PX: REVISION OF ARTERIOVENOUS GORETEX GRAFT: SHX6073

## 2016-03-27 HISTORY — DX: Anemia, unspecified: D64.9

## 2016-03-27 HISTORY — DX: Type 2 diabetes mellitus with unspecified diabetic retinopathy without macular edema: E11.319

## 2016-03-27 HISTORY — DX: Unspecified macular degeneration: H35.30

## 2016-03-27 HISTORY — PX: EXCHANGE OF A DIALYSIS CATHETER: SHX5818

## 2016-03-27 HISTORY — DX: Unspecified glaucoma: H40.9

## 2016-03-27 LAB — POCT I-STAT 4, (NA,K, GLUC, HGB,HCT)
GLUCOSE: 145 mg/dL — AB (ref 65–99)
HCT: 32 % — ABNORMAL LOW (ref 36.0–46.0)
Hemoglobin: 10.9 g/dL — ABNORMAL LOW (ref 12.0–15.0)
POTASSIUM: 3.8 mmol/L (ref 3.5–5.1)
Sodium: 139 mmol/L (ref 135–145)

## 2016-03-27 LAB — GLUCOSE, CAPILLARY: GLUCOSE-CAPILLARY: 130 mg/dL — AB (ref 65–99)

## 2016-03-27 SURGERY — REVISION OF ARTERIOVENOUS GORETEX GRAFT
Anesthesia: General | Site: Thigh | Laterality: Right

## 2016-03-27 MED ORDER — EPHEDRINE SULFATE 50 MG/ML IJ SOLN
INTRAMUSCULAR | Status: DC | PRN
  Administered 2016-03-27 (×2): 10 mg via INTRAVENOUS
  Administered 2016-03-27: 20 mg via INTRAVENOUS
  Administered 2016-03-27: 10 mg via INTRAVENOUS
  Administered 2016-03-27: 15 mg via INTRAVENOUS

## 2016-03-27 MED ORDER — LABETALOL HCL 300 MG PO TABS
450.0000 mg | ORAL_TABLET | Freq: Once | ORAL | Status: AC
  Administered 2016-03-27: 450 mg via ORAL
  Filled 2016-03-27: qty 1.5

## 2016-03-27 MED ORDER — HEMOSTATIC AGENTS (NO CHARGE) OPTIME
TOPICAL | Status: DC | PRN
  Administered 2016-03-27 (×2): 1 via TOPICAL

## 2016-03-27 MED ORDER — GLYCOPYRROLATE 0.2 MG/ML IV SOSY
PREFILLED_SYRINGE | INTRAVENOUS | Status: AC
  Filled 2016-03-27: qty 3

## 2016-03-27 MED ORDER — OXYCODONE HCL 5 MG PO TABS
ORAL_TABLET | ORAL | Status: AC
  Filled 2016-03-27: qty 1

## 2016-03-27 MED ORDER — PROPOFOL 10 MG/ML IV BOLUS
INTRAVENOUS | Status: DC | PRN
  Administered 2016-03-27: 130 mg via INTRAVENOUS
  Administered 2016-03-27: 30 mg via INTRAVENOUS

## 2016-03-27 MED ORDER — FENTANYL CITRATE (PF) 250 MCG/5ML IJ SOLN
INTRAMUSCULAR | Status: AC
  Filled 2016-03-27: qty 5

## 2016-03-27 MED ORDER — MIDAZOLAM HCL 2 MG/2ML IJ SOLN
INTRAMUSCULAR | Status: AC
  Filled 2016-03-27: qty 2

## 2016-03-27 MED ORDER — ONDANSETRON HCL 4 MG/2ML IJ SOLN
INTRAMUSCULAR | Status: AC
  Filled 2016-03-27: qty 2

## 2016-03-27 MED ORDER — HEPARIN SODIUM (PORCINE) 1000 UNIT/ML IJ SOLN
INTRAMUSCULAR | Status: AC
  Filled 2016-03-27: qty 1

## 2016-03-27 MED ORDER — FENTANYL CITRATE (PF) 100 MCG/2ML IJ SOLN
INTRAMUSCULAR | Status: AC
  Filled 2016-03-27: qty 2

## 2016-03-27 MED ORDER — NEOSTIGMINE METHYLSULFATE 5 MG/5ML IV SOSY
PREFILLED_SYRINGE | INTRAVENOUS | Status: AC
  Filled 2016-03-27: qty 5

## 2016-03-27 MED ORDER — LIDOCAINE-EPINEPHRINE (PF) 1 %-1:200000 IJ SOLN
INTRAMUSCULAR | Status: AC
  Filled 2016-03-27: qty 30

## 2016-03-27 MED ORDER — LIDOCAINE 2% (20 MG/ML) 5 ML SYRINGE
INTRAMUSCULAR | Status: AC
  Filled 2016-03-27: qty 5

## 2016-03-27 MED ORDER — OXYCODONE HCL 5 MG PO TABS
5.0000 mg | ORAL_TABLET | Freq: Once | ORAL | Status: AC | PRN
  Administered 2016-03-27: 5 mg via ORAL

## 2016-03-27 MED ORDER — PROPOFOL 10 MG/ML IV BOLUS
INTRAVENOUS | Status: AC
  Filled 2016-03-27: qty 20

## 2016-03-27 MED ORDER — FENTANYL CITRATE (PF) 100 MCG/2ML IJ SOLN
25.0000 ug | INTRAMUSCULAR | Status: DC | PRN
  Administered 2016-03-27 (×4): 25 ug via INTRAVENOUS

## 2016-03-27 MED ORDER — ONDANSETRON HCL 4 MG/2ML IJ SOLN: 4.0000 mg | Freq: Once | INTRAMUSCULAR | Status: DC | PRN

## 2016-03-27 MED ORDER — HEPARIN SODIUM (PORCINE) 1000 UNIT/ML IJ SOLN
INTRAMUSCULAR | Status: DC | PRN
  Administered 2016-03-27: 8000 [IU] via INTRAVENOUS

## 2016-03-27 MED ORDER — HEPARIN SODIUM (PORCINE) 1000 UNIT/ML IJ SOLN
INTRAMUSCULAR | Status: DC | PRN
  Administered 2016-03-27: 3.4 mL via INTRAVENOUS

## 2016-03-27 MED ORDER — HEPARIN SODIUM (PORCINE) 5000 UNIT/ML IJ SOLN
INTRAMUSCULAR | Status: DC | PRN
  Administered 2016-03-27: 500 mL

## 2016-03-27 MED ORDER — FENTANYL CITRATE (PF) 250 MCG/5ML IJ SOLN
INTRAMUSCULAR | Status: DC | PRN
  Administered 2016-03-27: 50 ug via INTRAVENOUS

## 2016-03-27 MED ORDER — ONDANSETRON HCL 4 MG/2ML IJ SOLN
INTRAMUSCULAR | Status: DC | PRN
  Administered 2016-03-27: 4 mg via INTRAVENOUS

## 2016-03-27 MED ORDER — CHLORHEXIDINE GLUCONATE CLOTH 2 % EX PADS: 6.0000 | MEDICATED_PAD | Freq: Once | CUTANEOUS | Status: DC

## 2016-03-27 MED ORDER — IOPAMIDOL (ISOVUE-300) INJECTION 61%
INTRAVENOUS | Status: AC
  Filled 2016-03-27: qty 50

## 2016-03-27 MED ORDER — PROTAMINE SULFATE 10 MG/ML IV SOLN
INTRAVENOUS | Status: AC
  Filled 2016-03-27: qty 5

## 2016-03-27 MED ORDER — SODIUM CHLORIDE 0.9 % IV SOLN
INTRAVENOUS | Status: DC
  Administered 2016-03-27 (×2): via INTRAVENOUS

## 2016-03-27 MED ORDER — 0.9 % SODIUM CHLORIDE (POUR BTL) OPTIME
TOPICAL | Status: DC | PRN
  Administered 2016-03-27: 2000 mL

## 2016-03-27 MED ORDER — PROTAMINE SULFATE 10 MG/ML IV SOLN
INTRAVENOUS | Status: DC | PRN
  Administered 2016-03-27 (×4): 10 mg via INTRAVENOUS
  Administered 2016-03-27: 20 mg via INTRAVENOUS

## 2016-03-27 MED ORDER — OXYCODONE HCL 5 MG/5ML PO SOLN: 5.0000 mg | Freq: Once | ORAL | Status: AC | PRN

## 2016-03-27 MED ORDER — PHENYLEPHRINE HCL 10 MG/ML IJ SOLN
INTRAMUSCULAR | Status: DC | PRN
  Administered 2016-03-27 (×2): 80 ug via INTRAVENOUS
  Administered 2016-03-27 (×2): 120 ug via INTRAVENOUS

## 2016-03-27 MED ORDER — DEXTROSE 5 % IV SOLN
1.5000 g | INTRAVENOUS | Status: AC
  Administered 2016-03-27: 1.5 g via INTRAVENOUS
  Filled 2016-03-27: qty 1.5

## 2016-03-27 MED ORDER — EPHEDRINE 5 MG/ML INJ
INTRAVENOUS | Status: AC
  Filled 2016-03-27: qty 10

## 2016-03-27 MED ORDER — OXYCODONE HCL 5 MG PO TABS: 5.0000 mg | ORAL_TABLET | Freq: Four times a day (QID) | ORAL | Status: DC | PRN

## 2016-03-27 SURGICAL SUPPLY — 64 items
BAG DECANTER FOR FLEXI CONT (MISCELLANEOUS) ×4 IMPLANT
BIOPATCH RED 1 DISK 7.0 (GAUZE/BANDAGES/DRESSINGS) ×3 IMPLANT
BIOPATCH RED 1IN DISK 7.0MM (GAUZE/BANDAGES/DRESSINGS) ×1
CANISTER SUCTION 2500CC (MISCELLANEOUS) ×4 IMPLANT
CANNULA VESSEL 3MM 2 BLNT TIP (CANNULA) ×4 IMPLANT
CATH PALINDROME RT-P 15FX19CM (CATHETERS) IMPLANT
CATH PALINDROME RT-P 15FX23CM (CATHETERS) ×4 IMPLANT
CATH PALINDROME RT-P 15FX28CM (CATHETERS) IMPLANT
CATH PALINDROME RT-P 15FX55CM (CATHETERS) IMPLANT
CLIP TI MEDIUM 6 (CLIP) ×4 IMPLANT
CLIP TI WIDE RED SMALL 6 (CLIP) ×4 IMPLANT
COVER PROBE W GEL 5X96 (DRAPES) ×4 IMPLANT
DECANTER SPIKE VIAL GLASS SM (MISCELLANEOUS) ×4 IMPLANT
DRAPE C-ARM 42X72 X-RAY (DRAPES) ×4 IMPLANT
DRAPE CHEST BREAST 15X10 FENES (DRAPES) ×4 IMPLANT
ELECT REM PT RETURN 9FT ADLT (ELECTROSURGICAL) ×4
ELECTRODE REM PT RTRN 9FT ADLT (ELECTROSURGICAL) ×2 IMPLANT
GAUZE SPONGE 2X2 8PLY STRL LF (GAUZE/BANDAGES/DRESSINGS) ×2 IMPLANT
GAUZE SPONGE 4X4 16PLY XRAY LF (GAUZE/BANDAGES/DRESSINGS) ×4 IMPLANT
GLOVE BIO SURGEON STRL SZ 6.5 (GLOVE) ×12 IMPLANT
GLOVE BIO SURGEON STRL SZ7 (GLOVE) ×4 IMPLANT
GLOVE BIO SURGEONS STRL SZ 6.5 (GLOVE) ×4
GLOVE BIOGEL PI IND STRL 6.5 (GLOVE) ×4 IMPLANT
GLOVE BIOGEL PI IND STRL 7.5 (GLOVE) ×2 IMPLANT
GLOVE BIOGEL PI INDICATOR 6.5 (GLOVE) ×4
GLOVE BIOGEL PI INDICATOR 7.5 (GLOVE) ×2
GLOVE SURG SS PI 7.0 STRL IVOR (GLOVE) ×8 IMPLANT
GOWN STRL REUS W/ TWL LRG LVL3 (GOWN DISPOSABLE) ×8 IMPLANT
GOWN STRL REUS W/ TWL XL LVL3 (GOWN DISPOSABLE) ×4 IMPLANT
GOWN STRL REUS W/TWL LRG LVL3 (GOWN DISPOSABLE) ×8
GOWN STRL REUS W/TWL XL LVL3 (GOWN DISPOSABLE) ×4
GRAFT GORETEX STND 6X20 (Vascular Products) ×4 IMPLANT
GRAFT GORETEXSTD 6X20 (Vascular Products) ×2 IMPLANT
HEMOSTAT SPONGE AVITENE ULTRA (HEMOSTASIS) ×8 IMPLANT
KIT BASIN OR (CUSTOM PROCEDURE TRAY) ×4 IMPLANT
KIT ROOM TURNOVER OR (KITS) ×4 IMPLANT
LIQUID BAND (GAUZE/BANDAGES/DRESSINGS) ×8 IMPLANT
NEEDLE 18GX1X1/2 (RX/OR ONLY) (NEEDLE) ×4 IMPLANT
NEEDLE HYPO 25GX1X1/2 BEV (NEEDLE) ×4 IMPLANT
NS IRRIG 1000ML POUR BTL (IV SOLUTION) ×4 IMPLANT
PACK CV ACCESS (CUSTOM PROCEDURE TRAY) ×4 IMPLANT
PACK SURGICAL SETUP 50X90 (CUSTOM PROCEDURE TRAY) ×4 IMPLANT
PAD ARMBOARD 7.5X6 YLW CONV (MISCELLANEOUS) ×8 IMPLANT
SOAP 2 % CHG 4 OZ (WOUND CARE) ×4 IMPLANT
SPONGE GAUZE 2X2 STER 10/PKG (GAUZE/BANDAGES/DRESSINGS) ×2
SUT ETHILON 3 0 PS 1 (SUTURE) ×4 IMPLANT
SUT GORETEX 5 0 TT13 24 (SUTURE) ×12 IMPLANT
SUT GORETEX 6 0 TT 9 (SUTURE) ×4 IMPLANT
SUT MNCRL AB 4-0 PS2 18 (SUTURE) ×8 IMPLANT
SUT PROLENE 6 0 BV (SUTURE) ×8 IMPLANT
SUT PROLENE 7 0 BV 1 (SUTURE) IMPLANT
SUT SILK 0 TIES 10X30 (SUTURE) ×4 IMPLANT
SUT SILK 2 0 SH (SUTURE) ×4 IMPLANT
SUT VIC AB 3-0 SH 27 (SUTURE) ×6
SUT VIC AB 3-0 SH 27X BRD (SUTURE) ×6 IMPLANT
SYR 20CC LL (SYRINGE) ×8 IMPLANT
SYR 30ML LL (SYRINGE) IMPLANT
SYR 3ML LL SCALE MARK (SYRINGE) ×4 IMPLANT
SYR 5ML LL (SYRINGE) ×4 IMPLANT
SYR CONTROL 10ML LL (SYRINGE) ×4 IMPLANT
SYRINGE 10CC LL (SYRINGE) ×4 IMPLANT
TAPE CLOTH SURG 4X10 WHT LF (GAUZE/BANDAGES/DRESSINGS) ×4 IMPLANT
UNDERPAD 30X30 INCONTINENT (UNDERPADS AND DIAPERS) ×4 IMPLANT
WATER STERILE IRR 1000ML POUR (IV SOLUTION) ×4 IMPLANT

## 2016-03-27 NOTE — Anesthesia Preprocedure Evaluation (Addendum)
Anesthesia Evaluation  Patient identified by MRN, date of birth, ID band Patient awake    Reviewed: Allergy & Precautions, NPO status , Patient's Chart, lab work & pertinent test results  Airway Mallampati: II  TM Distance: >3 FB     Dental  (+) Edentulous Upper, Dental Advisory Given   Pulmonary former smoker,    breath sounds clear to auscultation       Cardiovascular hypertension,  Rhythm:Regular Rate:Normal     Neuro/Psych    GI/Hepatic GERD  ,  Endo/Other  diabetes, Insulin Dependent  Renal/GU ESRF and DialysisRenal disease (HD t, Th, S)     Musculoskeletal  (+) Arthritis ,   Abdominal   Peds  Hematology  (+) anemia ,   Anesthesia Other Findings   Reproductive/Obstetrics                            Anesthesia Physical Anesthesia Plan  ASA: III  Anesthesia Plan: General   Post-op Pain Management:    Induction: Intravenous  Airway Management Planned: LMA  Additional Equipment:   Intra-op Plan:   Post-operative Plan:   Informed Consent: I have reviewed the patients History and Physical, chart, labs and discussed the procedure including the risks, benefits and alternatives for the proposed anesthesia with the patient or authorized representative who has indicated his/her understanding and acceptance.   Dental advisory given  Plan Discussed with: CRNA and Anesthesiologist  Anesthesia Plan Comments:         Anesthesia Quick Evaluation

## 2016-03-27 NOTE — Discharge Instructions (Signed)
° ° °  03/27/2016 Rennie PlowmanDianne Pellum [id number] 09/29/1946  Surgeon(s): Fransisco HertzBrian L Chen, MD  Procedure(s): REVISION OF LEFT ARTERIOVENOUS GORETEX GRAFT EXCHANGE OF A DIALYSIS CATHETER  x Do not stick graft for 4 weeks Use catheter until patient sees Dr. Imogene Burnhen  back in the office.

## 2016-03-27 NOTE — Anesthesia Postprocedure Evaluation (Signed)
Anesthesia Post Note  Patient: Colleen Mullins  Procedure(s) Performed: Procedure(s) (LRB): REVISION OF LEFT ARTERIOVENOUS GORETEX GRAFT (Left) EXCHANGE OF A DIALYSIS CATHETER (Right)  Patient location during evaluation: PACU Level of consciousness: awake and oriented Pain management: pain level controlled Vital Signs Assessment: post-procedure vital signs reviewed and stable Respiratory status: spontaneous breathing, nonlabored ventilation and respiratory function stable Cardiovascular status: blood pressure returned to baseline Anesthetic complications: no    Last Vitals:  Filed Vitals:   03/27/16 1606 03/27/16 1640  BP: 171/74 175/73  Pulse: 63 62  Temp:    Resp: 19     Last Pain:  Filed Vitals:   03/27/16 1655  PainSc: 8                  Jahfari Ambers COKER

## 2016-03-27 NOTE — Transfer of Care (Signed)
Immediate Anesthesia Transfer of Care Note  Patient: Rennie PlowmanDianne Cavendish  Procedure(s) Performed: Procedure(s): REVISION OF LEFT ARTERIOVENOUS GORETEX GRAFT (Left) EXCHANGE OF A DIALYSIS CATHETER (Right)  Patient Location: PACU  Anesthesia Type:General  Level of Consciousness: awake, alert , oriented and patient cooperative  Airway & Oxygen Therapy: Patient Spontanous Breathing and Patient connected to nasal cannula oxygen  Post-op Assessment: Report given to RN, Post -op Vital signs reviewed and stable and Patient moving all extremities  Post vital signs: Reviewed and stable  Last Vitals:  Filed Vitals:   03/27/16 1009 03/27/16 1121  BP:  217/83  Pulse: 73 72  Temp: 36.6 C   Resp: 18     Last Pain:  Filed Vitals:   03/27/16 1124  PainSc: 5       Patients Stated Pain Goal: 4 (03/27/16 1009)  Complications: No apparent anesthesia complications

## 2016-03-27 NOTE — H&P (View-Only) (Signed)
HISTORY AND PHYSICAL     CC:  Graft for dialysis needs to be checked Referring Provider:  Orr, Richard L., MD  HPI: This is a 70 y.o. female who presented to the ED on 02/17/16 for bleeding from her left thigh graft.  She states that morning, she woke up and and had blood on her pajamas. She cleaned up in the shower, placed a band-aid on the area and went to dialysis.  In the waiting area, the band-aid became saturated and 4x4 gauze was placed on the area.  By the time she was called back for her dialysis, that dressing had saturated.  She continued to have oozing from the graft.  She was sent to the ER where 2 simple sutures were placed with hemostasis achieved.  She presents today for evaluation of her graft.  She states that it is still a little painful to touch.  There has been no further bleeding from the graft.  She did have a left tunneled HD catheter placed in the right IJ on 02/20/16 by IR.  She states that the machine alarms every time she is on HD now with the catheter.   She has a hx of LUA AVG in the past that was removed in 2012 by Dr. Fields.  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  She has been using her left thigh graft, which was placed in [place], without any difficulty until recently for 5 years.   She is diabetic and takes insulin.  She is on an ARB, CCB, and BB for blood pressure management.  She takes a daily aspirin.    Past Medical History  Diagnosis Date  . Diabetes mellitus   . Hypertension   . Hyperlipidemia   . GERD (gastroesophageal reflux disease)   . Arthritis   . H/O: Bell's palsy   . Chronic kidney disease   . Dialysis patient (HCC)     M-W-F @ [facility]  . Ejection fraction     Past Surgical History  Procedure Laterality Date  . Tonsillectomy    . Btl    . Arteriovenous graft placement      Left forearm x 2, one removed in  March  . Arteriovenous graft placement      Left forearm  . Laparotomy      Pelvic mass (Benign)  . Avgg removal  10/08/2011    Procedure: REMOVAL OF ARTERIOVENOUS GORETEX GRAFT (AVGG);  Surgeon: Charles E Fields, MD;  Location: [facility] OR;  Service: Vascular;  Laterality: Left;  . Abdominal hysterectomy      Allergies  Allergen Reactions  . Lactose Intolerance (Gi) Other (See Comments)    Abdominal pains and bloating    Current Outpatient Prescriptions  Medication Sig Dispense Refill  . acetaminophen (TYLENOL) 325 MG tablet Take 650 mg by mouth every 6 (six) hours as needed for moderate pain.    . acetaminophen (TYLENOL) 500 MG tablet Take 2,000 mg by mouth daily as needed for moderate pain.    . amLODipine (NORVASC) 10 MG tablet Take 10 mg by mouth at bedtime.     . aspirin EC 81 MG tablet Take 81 mg by mouth at bedtime.     . bisacodyl (DULCOLAX) 5 MG EC tablet Take 5 mg by mouth daily as needed for moderate constipation.    . bismuth subsalicylate (KAOPECTATE)   262 MG/15ML suspension Take 30 mLs by mouth every 6 (six) hours as needed for indigestion or diarrhea or loose stools.    . calcium acetate (PHOSLO) 667 MG capsule Take 1,334 mg by mouth 3 (three) times daily with meals. Takes 2 caps with meals and 1 cap with snacks    . Chlorpheniramine-APAP (CORICIDIN) 2-325 MG TABS Take 2 tablets by mouth daily as needed (for cold).    . cinacalcet (SENSIPAR) 60 MG tablet Take 60 mg by mouth at bedtime.     . fenofibrate (TRICOR) 145 MG tablet Take 145 mg by mouth every evening.     . fish oil-omega-3 fatty acids 1000 MG capsule Take 1 g by mouth at bedtime.     . insulin aspart (NOVOLOG) 100 UNIT/ML injection Inject 0-9 Units into the skin 3 (three) times daily with meals. 10 mL 11  . labetalol (NORMODYNE) 300 MG tablet Take 300 mg by mouth 2 (two) times daily.      . losartan (COZAAR) 100 MG tablet Take 100 mg by mouth every evening.    . multivitamin (RENA-VIT) TABS tablet Take 1 tablet by  mouth every evening.     . Travoprost, BAK Free, (TRAVATAN) 0.004 % SOLN ophthalmic solution Place 1 drop into both eyes at bedtime.     No current facility-administered medications for this visit.    Family History  Problem Relation Age of Onset  . Diabetes Mother   . Heart disease Mother   . COPD Father   . Hypertension Father     Social History   Social History  . Marital Status: Widowed    Spouse Name: N/A  . Number of Children: N/A  . Years of Education: N/A   Occupational History  . Not on file.   Social History Main Topics  . Smoking status: Former Smoker    Types: Cigarettes    Quit date: 08/04/1999  . Smokeless tobacco: Never Used  . Alcohol Use: No  . Drug Use: No  . Sexual Activity: Not on file   Other Topics Concern  . Not on file   Social History Narrative     REVIEW OF SYSTEMS:   [X] denotes positive finding, [ ] denotes negative finding Cardiac  Comments:  Chest pain or chest pressure:    Shortness of breath upon exertion:    Short of breath when lying flat:    Irregular heart rhythm:        Vascular    Pain in calf, thigh, or hip brought on by ambulation:    Pain in feet at night that wakes you up from your sleep:     Blood clot in your veins:    Leg swelling:         Pulmonary    Oxygen at home:    Productive cough:     Wheezing:         Neurologic    Sudden weakness in arms or legs:     Sudden numbness in arms or legs:     Sudden onset of difficulty speaking or slurred speech:    Temporary loss of vision in one eye:     Problems with dizziness:         Gastrointestinal    Blood in stool:     Vomited blood:         Genitourinary    Burning when urinating:     Blood in urine:        Psychiatric      Major depression:         Hematologic    Bleeding problems: x Bleeding from left thigh graft  Problems with blood clotting too easily:        Skin    Rashes or ulcers: x       Constitutional    Fever or chills:       PHYSICAL EXAMINATION:  Filed Vitals:   03/15/16 1512  BP: 150/70  Pulse: 70  Temp: 99 F (37.2 C)  Resp: 16   Body mass index is 27.45 kg/(m^2).  General:  WDWN in NAD; vital signs documented above Gait: Not observed HENT: WNL, normocephalic Pulmonary: normal non-labored breathing , without Rales, rhonchi,  wheezing Cardiac: regular HR, without  Murmurs, rubs or gallops; without carotid bruits Abdomen: soft, NT, no masses Skin: without rashes Vascular Exam/Pulses:  Right Left  Radial 2+ (normal) 2+ (normal)  Ulnar Unable to palpate  Unable to palpate   DP 1+ (weak) 1+ (weak)  PT Unable to palpate Unable to palpate    Extremities: without ischemic changes, without Gangrene , without cellulitis; without open wounds; Left thigh graft with +thrill/bruit; 2 pseudoaneurysms present laterally and inferior/medially.  There is color change of the skin and the skin is thin.   Musculoskeletal: no muscle wasting or atrophy  Neurologic: A&O X 3;  No focal weakness or paresthesias are detected Psychiatric:  The pt has Normal affect. Lymph : No Cervical, Axillary, or Inguinal lymphadenopathy    Non-Invasive Vascular Imaging:   None today  Pt meds includes: Statin:  No. Beta Blocker:  Yes.   Aspirin:  Yes.   ACEI:  No. ARB:  Yes.   Other Antiplatelet/Anticoagulant:  No.    ASSESSMENT/PLAN:: 70 y.o. female with ESRD with recent bleeding from her left thigh AVG and recent placement of right IJ TDC by IR on 02/20/16.  She is here for evaluation of her left thigh graft.   -she has been using her left thigh graft for 5 years without difficulty until recently when she had some bleeding and required two sutures in the ER.  She has since had a TDC placed in the right IJ, which is not functioning correctly on HD.  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 -Dr. Collen Vincent has suggested surgical revision on Mar 27, 2016 with repositioning or  possible exchange of her dialysis catheter at that time since it is not functioning correctly at HD.  Pt understands and agrees to proceed. -she understands that her left thigh graft will not be able to be accessed for 4 weeks after surgery.  After the graft has been used 2-3 times successfully, her catheter will be able to be removed.  -she has had a left AVG removed from her left arm in 2012.   -she has never had access in the right arm at her request. -if her left thigh graft becomes non usable, she may have further options in her upper extremities in the future.     Samantha Rhyne, PA-C [facility] [phone]  Clinic MD:  Pt seen and examined in conjunction with Dr. Rollyn Scialdone   Addendum  I have independently interviewed and examined the patient, and I agree with the physician assistant's findings.  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  She already has a TDC in place which apparently has poor flow rates.  Will plan   on exchange of the RIJV TDC and revision of the left thigh AVG with a new segment spanning from the non-aneurysmal  arterial limb to the non-aneurysmal venous limb.  Odean Fester, MD [facility] [place] Office: [phone] Pager: [phone]  03/15/2016, 5:35 PM   

## 2016-03-27 NOTE — Interval H&P Note (Signed)
[facility] [place]  History and Physical Update  The patient was interviewed and re-examined.  The patient's previous History and Physical has been reviewed and is unchanged from my consult.  There is no change in the plan of care: RIJV TDC exchange, L thigh AVG revision.   The patient is aware the risks of tunneled dialysis catheter placement include but are not limited to: bleeding, infection, central venous injury, pneumothorax, possible venous stenosis, possible malpositioning in the venous system, and possible infections related to long-term catheter presence.  Risk, benefits, and alternatives to access surgery were discussed.   The patient is aware the risks include but are not limited to: bleeding, infection, steal syndrome, nerve damage, ischemic monomelic neuropathy, failure to mature, need for additional procedures, death and stroke.   The patient was aware of these risks and agreed to proceed.Colleen Mullins.   Colleen Strout, MD [facility] [place] Office: [phone] Pager: [phone]  03/27/2016, 10:59 AM

## 2016-03-27 NOTE — Anesthesia Procedure Notes (Signed)
Procedure Name: LMA Insertion Date/Time: 03/27/2016 11:49 AM Performed by: Lucinda DellECARLO, Aelyn Stanaland M Pre-anesthesia Checklist: Patient identified, Suction available, Patient being monitored and Emergency Drugs available Patient Re-evaluated:Patient Re-evaluated prior to inductionOxygen Delivery Method: Circle system utilized Preoxygenation: Pre-oxygenation with 100% oxygen Intubation Type: IV induction LMA: LMA inserted LMA Size: 4.0 Tube type: Oral Number of attempts: 1 Placement Confirmation: positive ETCO2 and breath sounds checked- equal and bilateral Tube secured with: Tape Dental Injury: Teeth and Oropharynx as per pre-operative assessment

## 2016-03-27 NOTE — Op Note (Signed)
OPERATIVE NOTE  PROCEDURE: 1.  Right internal jugular vein tunneled dialysis catheter exchange x 2 2.  Left thigh arteriovenous graft revision  PRE-OPERATIVE DIAGNOSIS: end-stage renal failure  POST-OPERATIVE DIAGNOSIS: same as above  SURGEON: Colleen Sake, MD  ANESTHESIA: general  ESTIMATED BLOOD LOSS: 30 cc  FINDING(S): 1.  Tips of the catheter in the right atrium on fluoroscopy 2.  No obvious pneumothorax on fluoroscopy  SPECIMEN(S):  none  INDICATIONS:   Colleen Mullins is a 70 y.o. female who presents with poor right internal jugular vein tunneled dialysis catheter function and left thigh arteriovenous graft pseudoaneurysms.  The patient presents for tunneled dialysis catheter exchange and revision of left thigh arteriovenous graft.  The patient is aware the risks of tunneled dialysis catheter exchange include but are not limited to: bleeding, infection, central venous injury, pneumothorax, possible venous stenosis, possible malpositioning in the venous system, and possible infections related to long-term catheter presence.  Risk, benefits, and alternatives to access surgery were discussed.  The patient is aware the risks include but are not limited to: bleeding, infection, steal syndrome, nerve damage, ischemic monomelic neuropathy, failure to mature, need for additional procedures, death and stroke.  The patient agrees to proceed forward with the procedure.  DESCRIPTION: After written full informed consent was obtained from the patient, the patient was taken back to the operating room.  Prior to induction, the patient was given IV antibiotics.  After obtaining adequate sedation, the patient was prepped and draped in the standard fashion for a chest or neck tunneled dialysis catheter placement.  I first took a quick xray with the C-arm.  The prior tunneled dialysis catheter appeared to be in the superior vena cava, so I felt exchange for a longer tunneled dialysis catheter was  indicated.    I made an incision over the prior right internal jugular vein tunneled dialysis catheter in the neck.  I dissected out the tunneled dialysis catheter.  I clamped the tunneled dialysis catheter and then transected the prior tunneled dialysis catheter, revealing the lumens of the catheter.  The catheter was secured with a clamp and one lumen was cannulated with the J-wire, which was advanced into the right ventricle under fluroscopic guidance.  The prior catheter was removed over the wire and then a dilator sheath was loaded over the wire into the superior vena cava.  I removed the dilator and wire.  I loaded a 28 cm Palindrome catheter over the wire.  I broke the peelaway sheath and then held the cuff of the catheter prior to peeling off the sheath.  This catheter appeared to be too long.  I pulled the catheter back and this appeared to remove the extra length.    I then made a stab incision at the exit site.   I dissected from the exit site to the neck site with a metal tunneler.  I cut off the distal end of this catheter and attached the catheter to the metal tunneler.  This catheter was delivered through the subcutaneous tunnel to the exit site.  I pulled this catheter into position.  I still felt this catheter was too long, so a shorter catheter was needed.  I repeated this process as listed above.  This time I place a 23 cm Palindrome.  This time back end of this catheter was transected, revealing the two lumens of this catheter.  The ports were docked onto these two lumens.  The catheter hub was clipped into place.  Each  port was tested by aspirating and flushing.  No resistance was noted.  Each port was then thoroughly flushed with heparinized saline.  The catheter was secured in placed with two interrupted stitches of 3-0 Nylon tied to the catheter.  The neck incision was closed with a U-stitch of 4-0 Monocryl.  The neck and chest incision were cleaned and sterile bandages applied.  Each  port was then loaded with concentrated heparin (1000 Units/mL) at the manufacturer recommended volumes to each port.  Sterile caps were applied to each port.  On completion fluoroscopy, the tips of the catheter were in the right atrium, and there was no evidence of pneumothorax.  At this point the drapes were taken down and the patient was repositioned.  Her right thigh was prepped and draped in the standard fashion for a right thigh arteriovenous graft.  I identified the graft proximal to a pseudoaneurysm in the arterial arm in the thigh arteriovenous graft and marked the position.  Similiarly, I marked the graft distal to a pseudoaneurysm in the venous arm of the thigh arteriovenous graft and marked the position.  I made incisions over the graft to gain access to the thigh arteriovenous graft proximal and distal to the two pseudoaneurysm.  The thigh arteriovenous graft was dissected out with electrocautery.  I then bluntly started a new tunnel outside the arc of the prior thigh arteriovenous graft.  Using metal tunneler, I was not able to completely pass the metal tunneler.  I had to make an counter incision at the apex of the new segment's tunnel.  This was done with a 10 blade and electrocautery.  I dissected from the venous exposure to the counter incision with a metal tunneler and delivered the 6 mm Goretex graft, taking care to maintain orientation of the graft.  I then tunneled from the arterial arm exposure to the counterincision.  I tied the graft to the inner cannula with a 2-0 Silk suture.  I delivered the graft through the metal tunnel.  I removed the tunnel, leaving the graft in place.  I clamped each end of the graft to keep the graft in position.     The patient was given 8000 units of Heparin intravenously, which was a therapeutic bolus. After waiting 3 minutes, I clamped the thigh arteriovenous graft proximally and distally to the two pseudoaneurysms.  I then tied off the segment of  pseudoaneurysmal graft proximally and distally with 0 silks.  I transected the old graft in the venous and arterial arm exposures.  I beveled the two ends of the old graft to facilitate an end-to-end anastomosis.  I similarly beveled the two end of the new graft to facilitate an end-to-end anastomosis.  The new graft was sewn to the old graft with a running stitch of CV-5 on both ends.  I completed the arterial side first.  I bled the graft out the venous exposure.  Good antegrade bleeding was evident.  I also backbleed the old end of the venous arm.  There was good backbleeding.  This anastomosis was completed in the usual fashion.  I packed each exposure with Avitene.  I gave the patient 40 mg of Protamine to reverse anticoagulation.  I inspected both exposures.  There appears to be some limited bleeding from the new tunnel.  I controlled a few bleeding points in the subcutaneous tissue with electrocautery.  I also placed a couple of CV-5 stitches to control pleats in the new anastomoses.  There was some swelling  in the old pseudoaneurysms, so I opened the tie around the old graft in the arterial arm exposure.  I blunt decompressed residual blood in the old graft and retied the old graft with 0 silk ties.  At this point, there continued to be needle hole bleeding so I gave another 20 mg of Protamine.  I washed out all incision and reapplied Avitene.    After waiting a few minutes, I examined the counterincision.  There was no active bleeding.  The subcutaneous tissue was reapproximated with a double layer of 3-0 Vicryl.  The skin was then reapproximated with a running subcuticular of 4-0 Monocryl.  I washed out the arterial and venous limb exposures.  I repacked the arterial exposure with Avitene and then repaired the venous limb exposure.  This was completed with a double layer of 3-0 Vicryl and a running subcuticular stitch of 4-0 Monocryl in the skin.  At this point, I turned my attention to the arterial limb  incision.  There no active bleeding but there serous weeping through the graft wall.  I repaired this exposure with a double layer of 3-0 Vicryl in the subcutaneous tissue and then repaired the skin with a running subcuticular stitch of 4-0 Monocryl.  The skin at all sites was washed off, dried, and reinforced with Dermabond.   COMPLICATIONS: none  CONDITION: stable   Colleen SakeBrian Quanda Pavlicek, MD [facility] Office: [phone] Pager: [phone]  03/27/2016, 12:44 PM

## 2016-03-27 NOTE — Progress Notes (Signed)
Dialysis access report faxed to [facility]. 

## 2016-03-28 ENCOUNTER — Telehealth: Payer: Self-pay | Admitting: Vascular Surgery

## 2016-03-28 ENCOUNTER — Encounter (HOSPITAL_COMMUNITY): Payer: Self-pay | Admitting: Vascular Surgery

## 2016-03-28 NOTE — Telephone Encounter (Signed)
-----   Message from Sharee PimpleMarilyn K McChesney, RN sent at 03/27/2016  3:26 PM EDT ----- Regarding: schedule   ----- Message -----    From: Dara LordsSamantha J Rhyne, PA-C    Sent: 03/27/2016   2:48 PM      To: [facility] Charge Pool  S/p revision left thigh graft 03/27/16. F/u with Dr. Imogene Burnhen in 3 weeks.  Thanks, Lelon MastSamantha

## 2016-03-28 NOTE — Telephone Encounter (Signed)
sched appt 6/2 at 9:45. Spoke to pt to inform them.

## 2016-04-13 ENCOUNTER — Other Ambulatory Visit: Payer: Self-pay

## 2016-04-13 ENCOUNTER — Emergency Department (HOSPITAL_COMMUNITY): Payer: Medicare HMO

## 2016-04-13 ENCOUNTER — Encounter (HOSPITAL_COMMUNITY): Payer: Self-pay

## 2016-04-13 DIAGNOSIS — H409 Unspecified glaucoma: Secondary | ICD-10-CM | POA: Insufficient documentation

## 2016-04-13 DIAGNOSIS — R0602 Shortness of breath: Secondary | ICD-10-CM | POA: Diagnosis present

## 2016-04-13 DIAGNOSIS — Z992 Dependence on renal dialysis: Secondary | ICD-10-CM | POA: Insufficient documentation

## 2016-04-13 DIAGNOSIS — D649 Anemia, unspecified: Secondary | ICD-10-CM | POA: Insufficient documentation

## 2016-04-13 DIAGNOSIS — N189 Chronic kidney disease, unspecified: Secondary | ICD-10-CM | POA: Diagnosis not present

## 2016-04-13 DIAGNOSIS — E1122 Type 2 diabetes mellitus with diabetic chronic kidney disease: Secondary | ICD-10-CM | POA: Diagnosis not present

## 2016-04-13 DIAGNOSIS — K219 Gastro-esophageal reflux disease without esophagitis: Secondary | ICD-10-CM | POA: Diagnosis not present

## 2016-04-13 DIAGNOSIS — Z794 Long term (current) use of insulin: Secondary | ICD-10-CM | POA: Insufficient documentation

## 2016-04-13 DIAGNOSIS — R0609 Other forms of dyspnea: Principal | ICD-10-CM | POA: Insufficient documentation

## 2016-04-13 DIAGNOSIS — Z79899 Other long term (current) drug therapy: Secondary | ICD-10-CM | POA: Insufficient documentation

## 2016-04-13 DIAGNOSIS — E785 Hyperlipidemia, unspecified: Secondary | ICD-10-CM | POA: Diagnosis not present

## 2016-04-13 DIAGNOSIS — M1990 Unspecified osteoarthritis, unspecified site: Secondary | ICD-10-CM | POA: Diagnosis not present

## 2016-04-13 DIAGNOSIS — H3530 Unspecified macular degeneration: Secondary | ICD-10-CM | POA: Insufficient documentation

## 2016-04-13 DIAGNOSIS — E11319 Type 2 diabetes mellitus with unspecified diabetic retinopathy without macular edema: Secondary | ICD-10-CM | POA: Diagnosis not present

## 2016-04-13 DIAGNOSIS — Z7982 Long term (current) use of aspirin: Secondary | ICD-10-CM | POA: Diagnosis not present

## 2016-04-13 DIAGNOSIS — E119 Type 2 diabetes mellitus without complications: Secondary | ICD-10-CM | POA: Insufficient documentation

## 2016-04-13 LAB — I-STAT TROPONIN, ED: TROPONIN I, POC: 0.11 ng/mL — AB (ref 0.00–0.08)

## 2016-04-13 LAB — BASIC METABOLIC PANEL
Anion gap: 14 (ref 5–15)
BUN: 16 mg/dL (ref 6–20)
CALCIUM: 8.3 mg/dL — AB (ref 8.9–10.3)
CO2: 29 mmol/L (ref 22–32)
Chloride: 96 mmol/L — ABNORMAL LOW (ref 101–111)
Creatinine, Ser: 2.73 mg/dL — ABNORMAL HIGH (ref 0.44–1.00)
GFR calc Af Amer: 19 mL/min — ABNORMAL LOW (ref >60)
GFR, EST NON AFRICAN AMERICAN: 16 mL/min — AB (ref >60)
GLUCOSE: 179 mg/dL — AB (ref 65–99)
Potassium: 3.2 mmol/L — ABNORMAL LOW (ref 3.5–5.1)
Sodium: 139 mmol/L (ref 135–145)

## 2016-04-13 LAB — CBC
HCT: 34.5 % — ABNORMAL LOW (ref 36.0–46.0)
Hemoglobin: 10.3 g/dL — ABNORMAL LOW (ref 12.0–15.0)
MCH: 27 pg (ref 26.0–34.0)
MCHC: 29.9 g/dL — ABNORMAL LOW (ref 30.0–36.0)
MCV: 90.3 fL (ref 78.0–100.0)
Platelets: 333 10*3/uL (ref 150–400)
RBC: 3.82 MIL/uL — ABNORMAL LOW (ref 3.87–5.11)
RDW: 20.8 % — ABNORMAL HIGH (ref 11.5–15.5)
WBC: 9.1 10*3/uL (ref 4.0–10.5)

## 2016-04-13 NOTE — ED Notes (Signed)
Onset 04-08-16 shortness of breath.  Pt had dialysis Tu-TH and today, dialysis took off extra fluid today.  Pt felt ok once leaving dialysis but tonight shortness of breath worsened.  Pt talking in complete sentences.

## 2016-04-14 ENCOUNTER — Encounter (HOSPITAL_COMMUNITY): Payer: Self-pay | Admitting: Radiology

## 2016-04-14 ENCOUNTER — Observation Stay (HOSPITAL_COMMUNITY)
Admission: EM | Admit: 2016-04-14 | Discharge: 2016-04-16 | Disposition: A | Discharge status: Home / Self Care | Payer: Medicare HMO | Attending: Family Medicine | Admitting: Family Medicine

## 2016-04-14 ENCOUNTER — Observation Stay (HOSPITAL_COMMUNITY): Payer: Medicare HMO

## 2016-04-14 DIAGNOSIS — E11319 Type 2 diabetes mellitus with unspecified diabetic retinopathy without macular edema: Secondary | ICD-10-CM | POA: Diagnosis not present

## 2016-04-14 DIAGNOSIS — R0600 Dyspnea, unspecified: Secondary | ICD-10-CM

## 2016-04-14 DIAGNOSIS — R0609 Other forms of dyspnea: Secondary | ICD-10-CM | POA: Diagnosis not present

## 2016-04-14 DIAGNOSIS — R7989 Other specified abnormal findings of blood chemistry: Secondary | ICD-10-CM | POA: Diagnosis not present

## 2016-04-14 DIAGNOSIS — R778 Other specified abnormalities of plasma proteins: Secondary | ICD-10-CM | POA: Diagnosis present

## 2016-04-14 DIAGNOSIS — H3530 Unspecified macular degeneration: Secondary | ICD-10-CM | POA: Diagnosis not present

## 2016-04-14 DIAGNOSIS — N186 End stage renal disease: Secondary | ICD-10-CM

## 2016-04-14 DIAGNOSIS — H409 Unspecified glaucoma: Secondary | ICD-10-CM | POA: Diagnosis not present

## 2016-04-14 DIAGNOSIS — E1129 Type 2 diabetes mellitus with other diabetic kidney complication: Secondary | ICD-10-CM | POA: Diagnosis present

## 2016-04-14 DIAGNOSIS — Z992 Dependence on renal dialysis: Secondary | ICD-10-CM

## 2016-04-14 DIAGNOSIS — R0602 Shortness of breath: Secondary | ICD-10-CM | POA: Diagnosis present

## 2016-04-14 DIAGNOSIS — E1122 Type 2 diabetes mellitus with diabetic chronic kidney disease: Secondary | ICD-10-CM

## 2016-04-14 DIAGNOSIS — I10 Essential (primary) hypertension: Secondary | ICD-10-CM

## 2016-04-14 DIAGNOSIS — E119 Type 2 diabetes mellitus without complications: Secondary | ICD-10-CM | POA: Insufficient documentation

## 2016-04-14 DIAGNOSIS — E785 Hyperlipidemia, unspecified: Secondary | ICD-10-CM | POA: Diagnosis present

## 2016-04-14 DIAGNOSIS — I110 Hypertensive heart disease with heart failure: Secondary | ICD-10-CM | POA: Diagnosis present

## 2016-04-14 HISTORY — DX: Other forms of dyspnea: R06.09

## 2016-04-14 HISTORY — DX: Dyspnea, unspecified: R06.00

## 2016-04-14 LAB — TROPONIN I
TROPONIN I: 0.22 ng/mL — AB (ref <0.031)
TROPONIN I: 0.24 ng/mL — AB (ref <0.031)
Troponin I: 0.21 ng/mL — ABNORMAL HIGH (ref <0.031)
Troponin I: 0.25 ng/mL — ABNORMAL HIGH (ref <0.031)

## 2016-04-14 LAB — RENAL FUNCTION PANEL
ANION GAP: 10 (ref 5–15)
Albumin: 2.7 g/dL — ABNORMAL LOW (ref 3.5–5.0)
BUN: 29 mg/dL — ABNORMAL HIGH (ref 6–20)
CHLORIDE: 94 mmol/L — AB (ref 101–111)
CO2: 29 mmol/L (ref 22–32)
Calcium: 7.9 mg/dL — ABNORMAL LOW (ref 8.9–10.3)
Creatinine, Ser: 4.65 mg/dL — ABNORMAL HIGH (ref 0.44–1.00)
GFR, EST AFRICAN AMERICAN: 10 mL/min — AB (ref >60)
GFR, EST NON AFRICAN AMERICAN: 9 mL/min — AB (ref >60)
Glucose, Bld: 157 mg/dL — ABNORMAL HIGH (ref 65–99)
POTASSIUM: 3.3 mmol/L — AB (ref 3.5–5.1)
Phosphorus: 2.5 mg/dL (ref 2.5–4.6)
Sodium: 133 mmol/L — ABNORMAL LOW (ref 135–145)

## 2016-04-14 LAB — GLUCOSE, CAPILLARY
GLUCOSE-CAPILLARY: 159 mg/dL — AB (ref 65–99)
GLUCOSE-CAPILLARY: 196 mg/dL — AB (ref 65–99)
Glucose-Capillary: 160 mg/dL — ABNORMAL HIGH (ref 65–99)
Glucose-Capillary: 113 mg/dL — ABNORMAL HIGH (ref 65–99)

## 2016-04-14 LAB — CBC
HEMATOCRIT: 28.5 % — AB (ref 36.0–46.0)
Hemoglobin: 8.5 g/dL — ABNORMAL LOW (ref 12.0–15.0)
MCH: 26.7 pg (ref 26.0–34.0)
MCHC: 29.8 g/dL — ABNORMAL LOW (ref 30.0–36.0)
MCV: 89.6 fL (ref 78.0–100.0)
Platelets: 358 10*3/uL (ref 150–400)
RBC: 3.18 MIL/uL — AB (ref 3.87–5.11)
RDW: 20.8 % — ABNORMAL HIGH (ref 11.5–15.5)
WBC: 10.4 10*3/uL (ref 4.0–10.5)

## 2016-04-14 LAB — MAGNESIUM: Magnesium: 2.8 mg/dL — ABNORMAL HIGH (ref 1.7–2.4)

## 2016-04-14 MED ORDER — CALCIUM ACETATE (PHOS BINDER) 667 MG PO CAPS
1334.0000 mg | ORAL_CAPSULE | Freq: Three times a day (TID) | ORAL | Status: DC
  Administered 2016-04-14 – 2016-04-16 (×9): 1334 mg via ORAL
  Filled 2016-04-14 (×10): qty 2

## 2016-04-14 MED ORDER — HYDRALAZINE HCL 25 MG PO TABS: 25.0000 mg | ORAL_TABLET | Freq: Four times a day (QID) | ORAL | Status: DC | PRN

## 2016-04-14 MED ORDER — FENOFIBRATE 160 MG PO TABS
160.0000 mg | ORAL_TABLET | Freq: Every day | ORAL | Status: DC
  Administered 2016-04-14 – 2016-04-15 (×2): 160 mg via ORAL
  Filled 2016-04-14 (×2): qty 1

## 2016-04-14 MED ORDER — AMLODIPINE BESYLATE 10 MG PO TABS
10.0000 mg | ORAL_TABLET | Freq: Every day | ORAL | Status: DC
  Administered 2016-04-15 (×2): 10 mg via ORAL
  Filled 2016-04-14 (×3): qty 1

## 2016-04-14 MED ORDER — OXYCODONE HCL 5 MG PO TABS
5.0000 mg | ORAL_TABLET | ORAL | Status: DC | PRN
  Administered 2016-04-14: 5 mg via ORAL
  Filled 2016-04-14: qty 1

## 2016-04-14 MED ORDER — LIDOCAINE-PRILOCAINE 2.5-2.5 % EX CREA: 1.0000 "application " | TOPICAL_CREAM | CUTANEOUS | Status: DC | PRN

## 2016-04-14 MED ORDER — LOSARTAN POTASSIUM 50 MG PO TABS
100.0000 mg | ORAL_TABLET | Freq: Every evening | ORAL | Status: DC
  Administered 2016-04-15: 100 mg via ORAL
  Filled 2016-04-14: qty 2

## 2016-04-14 MED ORDER — POTASSIUM CHLORIDE CRYS ER 20 MEQ PO TBCR
40.0000 meq | EXTENDED_RELEASE_TABLET | Freq: Once | ORAL | Status: AC
  Administered 2016-04-16: 40 meq via ORAL
  Filled 2016-04-14: qty 2

## 2016-04-14 MED ORDER — IOPAMIDOL (ISOVUE-370) INJECTION 76%
INTRAVENOUS | Status: AC
  Filled 2016-04-14: qty 100

## 2016-04-14 MED ORDER — IOPAMIDOL (ISOVUE-370) INJECTION 76%
INTRAVENOUS | Status: AC
Start: 2016-04-14 — End: 2016-04-14
  Administered 2016-04-14: 80 mL
  Filled 2016-04-14: qty 100

## 2016-04-14 MED ORDER — ACETAMINOPHEN 325 MG PO TABS: 650.0000 mg | ORAL_TABLET | Freq: Four times a day (QID) | ORAL | Status: DC | PRN

## 2016-04-14 MED ORDER — SODIUM CHLORIDE 0.9 % IV SOLN: 250.0000 mL | INTRAVENOUS | Status: DC | PRN

## 2016-04-14 MED ORDER — HEPARIN SODIUM (PORCINE) 5000 UNIT/ML IJ SOLN
5000.0000 [IU] | Freq: Three times a day (TID) | INTRAMUSCULAR | Status: DC
  Administered 2016-04-14 – 2016-04-16 (×6): 5000 [IU] via SUBCUTANEOUS
  Filled 2016-04-14 (×5): qty 1

## 2016-04-14 MED ORDER — CALCIUM ACETATE (PHOS BINDER) 667 MG PO CAPS
667.0000 mg | ORAL_CAPSULE | ORAL | Status: DC
  Administered 2016-04-15 (×3): 667 mg via ORAL
  Filled 2016-04-14: qty 1

## 2016-04-14 MED ORDER — ONDANSETRON HCL 4 MG PO TABS
4.0000 mg | ORAL_TABLET | Freq: Four times a day (QID) | ORAL | Status: DC | PRN
Start: 2016-04-14 — End: 2016-04-16

## 2016-04-14 MED ORDER — SODIUM CHLORIDE 0.9% FLUSH
3.0000 mL | INTRAVENOUS | Status: DC | PRN
  Administered 2016-04-14: 11:00:00 via INTRAVENOUS
  Filled 2016-04-14: qty 3

## 2016-04-14 MED ORDER — HYDRALAZINE HCL 25 MG PO TABS
25.0000 mg | ORAL_TABLET | Freq: Four times a day (QID) | ORAL | Status: DC
  Administered 2016-04-14 – 2016-04-16 (×5): 25 mg via ORAL
  Filled 2016-04-14 (×4): qty 1

## 2016-04-14 MED ORDER — ALTEPLASE 2 MG IJ SOLR: 2.0000 mg | Freq: Once | INTRAMUSCULAR | Status: DC | PRN

## 2016-04-14 MED ORDER — SODIUM CHLORIDE 0.9% FLUSH: 3.0000 mL | Freq: Two times a day (BID) | INTRAVENOUS | Status: DC

## 2016-04-14 MED ORDER — ACETAMINOPHEN 650 MG RE SUPP: 650.0000 mg | Freq: Four times a day (QID) | RECTAL | Status: DC | PRN

## 2016-04-14 MED ORDER — INSULIN ASPART 100 UNIT/ML ~~LOC~~ SOLN
0.0000 [IU] | Freq: Three times a day (TID) | SUBCUTANEOUS | Status: DC
  Administered 2016-04-14 – 2016-04-15 (×4): 2 [IU] via SUBCUTANEOUS
  Administered 2016-04-15: 1 [IU] via SUBCUTANEOUS
  Administered 2016-04-16 (×2): 2 [IU] via SUBCUTANEOUS

## 2016-04-14 MED ORDER — HEPARIN SODIUM (PORCINE) 1000 UNIT/ML DIALYSIS: 1000.0000 [IU] | INTRAMUSCULAR | Status: DC | PRN

## 2016-04-14 MED ORDER — INSULIN ASPART 100 UNIT/ML ~~LOC~~ SOLN: 0.0000 [IU] | Freq: Every day | SUBCUTANEOUS | Status: DC

## 2016-04-14 MED ORDER — LABETALOL HCL 300 MG PO TABS
450.0000 mg | ORAL_TABLET | Freq: Two times a day (BID) | ORAL | Status: DC
  Administered 2016-04-14 – 2016-04-15 (×4): 450 mg via ORAL
  Filled 2016-04-14 (×5): qty 2

## 2016-04-14 MED ORDER — CINACALCET HCL 30 MG PO TABS
60.0000 mg | ORAL_TABLET | Freq: Every day | ORAL | Status: DC
  Administered 2016-04-15 (×2): 60 mg via ORAL
  Filled 2016-04-14 (×3): qty 2

## 2016-04-14 MED ORDER — SODIUM CHLORIDE 0.9 % IV SOLN: 100.0000 mL | INTRAVENOUS | Status: DC | PRN

## 2016-04-14 MED ORDER — ONDANSETRON HCL 4 MG/2ML IJ SOLN: 4.0000 mg | Freq: Four times a day (QID) | INTRAMUSCULAR | Status: DC | PRN

## 2016-04-14 MED ORDER — SODIUM CHLORIDE 0.9% FLUSH
3.0000 mL | Freq: Two times a day (BID) | INTRAVENOUS | Status: DC
  Administered 2016-04-14 – 2016-04-16 (×5): 3 mL via INTRAVENOUS

## 2016-04-14 MED ORDER — HYDROMORPHONE HCL 1 MG/ML IJ SOLN
0.5000 mg | INTRAMUSCULAR | Status: DC | PRN
  Administered 2016-04-14: 1 mg via INTRAVENOUS
  Filled 2016-04-14: qty 1

## 2016-04-14 MED ORDER — PENTAFLUOROPROP-TETRAFLUOROETH EX AERO: 1.0000 "application " | INHALATION_SPRAY | CUTANEOUS | Status: DC | PRN

## 2016-04-14 MED ORDER — LIDOCAINE HCL (PF) 1 % IJ SOLN: 5.0000 mL | INTRAMUSCULAR | Status: DC | PRN

## 2016-04-14 MED ORDER — ASPIRIN EC 81 MG PO TBEC
81.0000 mg | DELAYED_RELEASE_TABLET | Freq: Every day | ORAL | Status: DC
  Administered 2016-04-15 (×2): 81 mg via ORAL
  Filled 2016-04-14 (×2): qty 1

## 2016-04-14 MED ORDER — OMEGA-3-ACID ETHYL ESTERS 1 G PO CAPS
1.0000 g | ORAL_CAPSULE | Freq: Every day | ORAL | Status: DC
  Administered 2016-04-15 (×2): 1 g via ORAL
  Filled 2016-04-14 (×2): qty 1

## 2016-04-14 MED ORDER — HEPARIN SODIUM (PORCINE) 1000 UNIT/ML DIALYSIS: 3000.0000 [IU] | Freq: Once | INTRAMUSCULAR | Status: DC

## 2016-04-14 MED ORDER — RENA-VITE PO TABS
1.0000 | ORAL_TABLET | Freq: Every day | ORAL | Status: DC
  Administered 2016-04-15 (×2): 1 via ORAL
  Filled 2016-04-14 (×2): qty 1

## 2016-04-14 NOTE — Consult Note (Signed)
Renal Service Consult Note [facility]  Colleen Mullins 04/14/2016 Berthold Glace D Requesting Physician:  Dr Lovell Sheehan, H.   Reason for Consult:  ESRD pt dyspnea HPI: The patient is a 70 y.o. year-old with hx of HTN, HL, DM2 and ESRD.  Presented to ED with SOB.  She was admitted, CXR showed bilat effusions and vasc congestion.  Asked to see for possible HD.    Patient reports SOB since Monday, 6 days ago, but only w exertion.  She says she was only 1 kg over dry wt on tues/ thurs , and so they only took off 1kg.  But on Sat her SOB worsened and they took of 5kg, lowering her dry wt by 4 kg from 76 to 72kg.  Doesn't miss HD.  Has a "cold" recently and hasn't been eating as much.    Lives w daughter in [place].  Has 4 daughters total.  Long hx DM w complications.  Worked for [employer], retired now.  NO hx CVA , MI or any cardiac issues.  NO cancer, no CVA.  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   She recently had a new L thigh AVG placed. The old L thigh graft worked for years but deteriorated so a new one was put in on May 3rd.  Also has a tunneled HD cath placed.    ROS  denies CP, no fevers, no prod cough  no HA  no blurry vision  no rash  no diarrhea  no nausea/ vomiting  no dysuria  no difficulty voiding  no change in urine color    Past Medical History  Past Medical History  Diagnosis Date  . Hypertension   . Hyperlipidemia   . GERD (gastroesophageal reflux disease)   . Arthritis   . H/O: Bell's palsy 2007  . Dialysis patient [facility])     M-W-F @ [facility]  . Ejection fraction   . Shortness of breath dyspnea     with walking  . Diabetes mellitus     type 2  . Chronic kidney disease     Dialysis T/Th/Sa  . Anemia     low iron  . Glaucoma   . Macular degeneration   . Diabetic retinopathy [facility])    Past Surgical History  Past Surgical History  Procedure Laterality Date  . Tonsillectomy    . Btl    . Arteriovenous graft placement      Left  forearm x 2, one removed in March  . Arteriovenous graft placement      Left forearm  . Laparotomy  ?2000    Pelvic mass (Benign)  . Avgg removal  10/08/2011    Procedure: REMOVAL OF ARTERIOVENOUS GORETEX GRAFT (AVGG);  Surgeon: Sherren Kerns, MD;  Location: [facility] OR;  Service: Vascular;  Laterality: Left;  . Abdominal hysterectomy    . Eye surgery Left     Lasik surgery on left. Cataract surgery on both eyes  . Colonoscopy    . Revision of arteriovenous goretex graft Left 03/27/2016    Procedure: REVISION OF LEFT ARTERIOVENOUS GORETEX GRAFT;  Surgeon: Fransisco Hertz, MD;  Location: [facility] OR;  Service: Vascular;  Laterality: Left;  . Exchange of a dialysis catheter Right 03/27/2016    Procedure: EXCHANGE OF A DIALYSIS CATHETER;  Surgeon: Fransisco Hertz, MD;  Location: [facility] OR;  Service: Vascular;  Laterality: Right;   Family History  Family History  Problem Relation Age of Onset  . Diabetes Mother   .  Heart disease Mother   . COPD Father   . Hypertension Father   . Lung disease Father   . Diabetes Father   . Diabetes Sister   . Breast cancer Sister    Social History  reports that she quit smoking about 16 years ago. Her smoking use included Cigarettes. She has never used smokeless tobacco. She reports that she does not drink alcohol or use illicit drugs. Allergies  Allergies  Allergen Reactions  . Lactose Intolerance (Gi) Other (See Comments)    Abdominal pains and bloating   Home medications Prior to Admission medications   Medication Sig Start Date End Date Taking? Authorizing Provider  acetaminophen (TYLENOL) 325 MG tablet Take 650 mg by mouth every 6 (six) hours as needed for moderate pain.   Yes Historical Provider, MD  amLODipine (NORVASC) 10 MG tablet Take 10 mg by mouth at bedtime.    Yes Historical Provider, MD  aspirin EC 81 MG tablet Take 81 mg by mouth at bedtime.    Yes Historical Provider, MD  bisacodyl (DULCOLAX) 5 MG EC tablet Take 5 mg by mouth daily as needed for  moderate constipation.   Yes Historical Provider, MD  bismuth subsalicylate (KAOPECTATE) 262 MG/15ML suspension Take 30 mLs by mouth every 6 (six) hours as needed for indigestion or diarrhea or loose stools.   Yes Historical Provider, MD  calcium acetate (PHOSLO) 667 MG capsule Take 1,334 mg by mouth 3 (three) times daily with meals. Takes 2 caps with meals and 1 cap with snacks   Yes Historical Provider, MD  Chlorpheniramine-APAP (CORICIDIN) 2-325 MG TABS Take 2 tablets by mouth daily as needed (for cold).   Yes Historical Provider, MD  cinacalcet (SENSIPAR) 60 MG tablet Take 60 mg by mouth at bedtime.    Yes Historical Provider, MD  fenofibrate (TRICOR) 145 MG tablet Take 145 mg by mouth every evening.    Yes Historical Provider, MD  fish oil-omega-3 fatty acids 1000 MG capsule Take 1 g by mouth at bedtime.    Yes Historical Provider, MD  insulin aspart (NOVOLOG) 100 UNIT/ML injection Inject 0-9 Units into the skin 3 (three) times daily with meals. 10/18/15  Yes Jeralyn Bennett, MD  labetalol (NORMODYNE) 300 MG tablet Take 450 mg by mouth 2 (two) times daily. To take 450 mg BID   Yes Historical Provider, MD  losartan (COZAAR) 100 MG tablet Take 100 mg by mouth every evening.   Yes Historical Provider, MD  multivitamin (RENA-VIT) TABS tablet Take 1 tablet by mouth every evening.    Yes Historical Provider, MD  oxyCODONE (ROXICODONE) 5 MG immediate release tablet Take 1 tablet (5 mg total) by mouth every 6 (six) hours as needed. 03/27/16  Yes Samantha J Rhyne, PA-C  oxyCODONE-acetaminophen (PERCOCET/ROXICET) 5-325 MG tablet Take 1 tablet by mouth every 4 (four) hours as needed for severe pain.   Yes Historical Provider, MD  Travoprost, BAK Free, (TRAVATAN) 0.004 % SOLN ophthalmic solution Place 1 drop into both eyes at bedtime.   Yes Historical Provider, MD   Liver Function Tests No results for input(s): AST, ALT, ALKPHOS, BILITOT, PROT, ALBUMIN in the last 168 hours. No results for input(s): LIPASE,  AMYLASE in the last 168 hours. CBC  Recent Labs Lab 04/13/16 2206  WBC 9.1  HGB 10.3*  HCT 34.5*  MCV 90.3  PLT 333   Basic Metabolic Panel  Recent Labs Lab 04/13/16 2206  NA 139  K 3.2*  CL 96*  CO2 29  GLUCOSE  179*  BUN 16  CREATININE 2.73*  CALCIUM 8.3*    Filed Vitals:   04/14/16 0700 04/14/16 0730 04/14/16 0802 04/14/16 1456  BP: 172/83 180/71 181/72 167/82  Pulse: 71 72 89 70  Temp:   98.5 F (36.9 C) 98.5 F (36.9 C)  TempSrc:   Oral Oral  Resp: 23 21 18 22   Height:   5\' 6"  (1.676 m)   Weight:   75.887 kg (167 lb 4.8 oz)   SpO2: 89% 92% 97% 90%   Exam Alert, looks tired and worried, uncomfortable, not in distress No rash, cyanosis or gangrene Sclera anicteric, throat clear  No jvd or bruits Chest bilat rales 1/3 up, mild exp wheezing RRR no MRG Abd soft ntnd no mass or ascites +bs GU deferred MS no joint effusions or deformity Ext no LE or UE edema / no wounds or ulcers Neuro is alert, Ox 3 , nf L thigh AVG +bruit, R IJ cath     Dialysis: SW TTS  4h  73kg   2/2.25 bath  P2  Hep 3000  R IJ Cath (L thigh new AVG maturing)  Assessment: 1  SOB/ vol excess/ pulm edema 2  ESRD TTS hd 3  HTN 4  DM w complications 5  Wt loss, lean body 6  SP new L thigh AVG 5/3   Plan - HD tonight for vol overload  Colleen Moselleob Torren Maffeo MD [facility] pager [phone]    cell [phone] 04/14/2016, 4:20 PM

## 2016-04-14 NOTE — H&P (Signed)
[facility] Admission History and Physical       Colleen Mullins ZOX:[id number] DOB: 07-Feb-1946 DOA: 04/14/2016  Referring physician: EDP PCP: Zoila Shutter, MD  Specialists:   Chief Complaint: SOB  HPI: Colleen Mullins is a 70 y.o. female with a history of ESRD on HD (T,Th,Sat), HTN, DM2 who presents to the ED with complaints of increased SOB x 5 days,  She denies any chest pain, but reports pain around the site of her temporary dialysis Catheter that was placed on 05/03.   Her last Dialysis treatment was on 05/20 and she reports they removed extra fluid even though she was at her dry weight of 76 kg.   She denies missing any of her Dialysis Treatments.  She report having cold symptoms for the past 5 days as well.   She denies any fevers and chills.      Review of Systems:  Constitutional: No Weight Loss, No Weight Gain, Night Sweats, Fevers, Chills, Dizziness, Light Headedness, Fatigue, or Generalized Weakness HEENT: No Headaches, Difficulty Swallowing,Tooth/Dental Problems,Sore Throat,  No Sneezing, Rhinitis, Ear Ache, Nasal Congestion, or Post Nasal Drip,  Cardio-vascular:  No Chest pain, Orthopnea, PND, Edema in Lower Extremities, Anasarca, Dizziness, Palpitations  Resp: +Dyspnea, No DOE, No Productive Cough, No Non-Productive Cough, No Hemoptysis, No Wheezing.    GI: No Heartburn, Indigestion, Abdominal Pain, Nausea, Vomiting, Diarrhea, Constipation, Hematemesis, Hematochezia, Melena, Change in Bowel Habits,  Loss of Appetite  GU: No Dysuria, No Change in Color of Urine, No Urgency or Urinary Frequency, No Flank pain.  Musculoskeletal: No Joint Pain or Swelling, No Decreased Range of Motion, No Back Pain.  Neurologic: No Syncope, No Seizures, Muscle Weakness, Paresthesia, Vision Disturbance or Loss, No Diplopia, No Vertigo, No Difficulty Walking,  Skin: No Rash or Lesions. Psych: No Change in Mood or Affect, No Depression or Anxiety, No Memory loss, No Confusion, or  Hallucinations   Past Medical History  Diagnosis Date  . Hypertension   . Hyperlipidemia   . GERD (gastroesophageal reflux disease)   . Arthritis   . H/O: Bell's palsy 2007  . Dialysis patient [facility])     M-W-F @ [facility]  . Ejection fraction   . Shortness of breath dyspnea     with walking  . Diabetes mellitus     type 2  . Chronic kidney disease     Dialysis T/Th/Sa  . Anemia     low iron  . Glaucoma   . Macular degeneration   . Diabetic retinopathy [facility])      Past Surgical History  Procedure Laterality Date  . Tonsillectomy    . Btl    . Arteriovenous graft placement      Left forearm x 2, one removed in March  . Arteriovenous graft placement      Left forearm  . Laparotomy  ?2000    Pelvic mass (Benign)  . Avgg removal  10/08/2011    Procedure: REMOVAL OF ARTERIOVENOUS GORETEX GRAFT (AVGG);  Surgeon: Sherren Kerns, MD;  Location: [facility] OR;  Service: Vascular;  Laterality: Left;  . Abdominal hysterectomy    . Eye surgery Left     Lasik surgery on left. Cataract surgery on both eyes  . Colonoscopy    . Revision of arteriovenous goretex graft Left 03/27/2016    Procedure: REVISION OF LEFT ARTERIOVENOUS GORETEX GRAFT;  Surgeon: Fransisco Hertz, MD;  Location: [facility] OR;  Service: Vascular;  Laterality: Left;  . Exchange of a dialysis catheter Right 03/27/2016  Procedure: EXCHANGE OF A DIALYSIS CATHETER;  Surgeon: Fransisco HertzBrian L Chen, MD;  [facility];  Service: Vascular;  Laterality: Right;      Prior to Admission medications   Medication Sig Start Date End Date Taking? Authorizing Provider  acetaminophen (TYLENOL) 325 MG tablet Take 650 mg by mouth every 6 (six) hours as needed for moderate pain.   Yes Historical Provider, MD  amLODipine (NORVASC) 10 MG tablet Take 10 mg by mouth at bedtime.    Yes Historical Provider, MD  aspirin EC 81 MG tablet Take 81 mg by mouth at bedtime.    Yes Historical Provider, MD  bisacodyl (DULCOLAX) 5 MG EC tablet Take 5 mg by mouth daily as  needed for moderate constipation.   Yes Historical Provider, MD  bismuth subsalicylate (KAOPECTATE) 262 MG/15ML suspension Take 30 mLs by mouth every 6 (six) hours as needed for indigestion or diarrhea or loose stools.   Yes Historical Provider, MD  calcium acetate (PHOSLO) 667 MG capsule Take 1,334 mg by mouth 3 (three) times daily with meals. Takes 2 caps with meals and 1 cap with snacks   Yes Historical Provider, MD  Chlorpheniramine-APAP (CORICIDIN) 2-325 MG TABS Take 2 tablets by mouth daily as needed (for cold).   Yes Historical Provider, MD  cinacalcet (SENSIPAR) 60 MG tablet Take 60 mg by mouth at bedtime.    Yes Historical Provider, MD  fenofibrate (TRICOR) 145 MG tablet Take 145 mg by mouth every evening.    Yes Historical Provider, MD  fish oil-omega-3 fatty acids 1000 MG capsule Take 1 g by mouth at bedtime.    Yes Historical Provider, MD  insulin aspart (NOVOLOG) 100 UNIT/ML injection Inject 0-9 Units into the skin 3 (three) times daily with meals. 10/18/15  Yes Jeralyn BennettEzequiel Zamora, MD  labetalol (NORMODYNE) 300 MG tablet Take 450 mg by mouth 2 (two) times daily. To take 450 mg BID   Yes Historical Provider, MD  losartan (COZAAR) 100 MG tablet Take 100 mg by mouth every evening.   Yes Historical Provider, MD  multivitamin (RENA-VIT) TABS tablet Take 1 tablet by mouth every evening.    Yes Historical Provider, MD  oxyCODONE (ROXICODONE) 5 MG immediate release tablet Take 1 tablet (5 mg total) by mouth every 6 (six) hours as needed. 03/27/16  Yes Samantha J Rhyne, PA-C  oxyCODONE-acetaminophen (PERCOCET/ROXICET) 5-325 MG tablet Take 1 tablet by mouth every 4 (four) hours as needed for severe pain.   Yes Historical Provider, MD  Travoprost, BAK Free, (TRAVATAN) 0.004 % SOLN ophthalmic solution Place 1 drop into both eyes at bedtime.   Yes Historical Provider, MD     Allergies  Allergen Reactions  . Lactose Intolerance (Gi) Other (See Comments)    Abdominal pains and bloating    Social  History:  reports that she quit smoking about 16 years ago. Her smoking use included Cigarettes. She has never used smokeless tobacco. She reports that she does not drink alcohol or use illicit drugs.     Family History  Problem Relation Age of Onset  . Diabetes Mother   . Heart disease Mother   . COPD Father   . Hypertension Father   . Lung disease Father   . Diabetes Father   . Diabetes Sister   . Breast cancer Sister        Physical Exam:  GEN:  Pleasant Well Nourished and Well Developed 70 y.o. African American female examined and in no acute distress; cooperative with exam Filed Vitals:  04/14/16 0300 04/14/16 0330 04/14/16 0400 04/14/16 0430  BP: 176/68 168/54 186/73 182/76  Pulse: 68 65 70 69  Temp:      TempSrc:      Resp: 23 24 23 19   SpO2: 96% 92% 100% 95%   Blood pressure 182/76, pulse 69, temperature 98.6 F (37 C), temperature source Oral, resp. rate 19, SpO2 95 %. PSYCH: SHe is alert and oriented x4; does not appear anxious does not appear depressed; affect is normal HEENT: Normocephalic and Atraumatic, Mucous membranes pink; PERRLA; EOM intact; Fundi:  Benign;  No scleral icterus, Nares: Patent, Oropharynx: Clear, Edentulous with Dentures,    Neck:  FROM, No Cervical Lymphadenopathy nor Thyromegaly or Carotid Bruit; No JVD; Breasts:: Not examined CHEST WALL: No tenderness CHEST: Normal respiration, clear to auscultation bilaterally HEART: Regular rate and rhythm; no murmurs rubs or gallops BACK: No kyphosis or scoliosis; No CVA tenderness ABDOMEN: Positive Bowel Sounds, Soft Non-Tender, No Rebound or Guarding; No Masses, No Organomegaly Rectal Exam: Not done EXTREMITIES: No Cyanosis, Clubbing, or Edema; No Ulcerations. Genitalia: not examined PULSES: 2+ and symmetric SKIN: Normal hydration no rash or ulceration CNS:  Alert and Oriented x 4, No Focal Deficits Vascular: pulses palpable throughout    Labs on Admission:  Basic Metabolic Panel:  Recent  Labs Lab 04/13/16 2206  NA 139  K 3.2*  CL 96*  CO2 29  GLUCOSE 179*  BUN 16  CREATININE 2.73*  CALCIUM 8.3*   Liver Function Tests: No results for input(s): AST, ALT, ALKPHOS, BILITOT, PROT, ALBUMIN in the last 168 hours. No results for input(s): LIPASE, AMYLASE in the last 168 hours. No results for input(s): AMMONIA in the last 168 hours. CBC:  Recent Labs Lab 04/13/16 2206  WBC 9.1  HGB 10.3*  HCT 34.5*  MCV 90.3  PLT 333   Cardiac Enzymes:  Recent Labs Lab 04/14/16 0333  TROPONINI 0.21*    BNP (last 3 results) No results for input(s): BNP in the last 8760 hours.  ProBNP (last 3 results) No results for input(s): PROBNP in the last 8760 hours.  CBG: No results for input(s): GLUCAP in the last 168 hours.  Radiological Exams on Admission: Dg Chest 2 View  04/13/2016  CLINICAL DATA:  Acute onset of shortness of breath. Initial encounter. EXAM: CHEST  2 VIEW COMPARISON:  Chest radiograph from 03/27/2016 FINDINGS: Small bilateral pleural effusions are seen. Mild bibasilar opacities could reflect minimal interstitial edema. No pneumothorax identified. The cardiomediastinal silhouette is mildly enlarged. No acute osseous abnormalities are seen. A right-sided dual-lumen catheter is noted ending about the distal SVC. IMPRESSION: Small bilateral pleural effusions seen. Mild bibasilar opacities could reflect minimal interstitial edema. Mild cardiomegaly noted. Electronically Signed   By: Roanna Raider M.D.   On: 04/13/2016 23:23     EKG: Independently reviewed. Normal sinus Rhythm rate = 72 Previous Anterolateral Infarct    Assessment/Plan:      70 y.o. female with  Principal Problem:    SOB (shortness of breath)   CTA of Chest Ordered and Pending   Cardiac Monitoring   Monitor 2 sats   NCO2 PRN   Active Problems:    Elevated troponin   Cycle Troponins   Cardiac Monitoring      ESRD on dialysis [facility])   Notify Dialysis Team this AM   Renal  Diet   Continue Phoslo, and Sensipar Rx         Type 2 diabetes mellitus with renal complication (HCC)   SSI  coverage PRN   Check Hb A1C in AM      Benign essential HTN   Continue Labetalol, Losartan, and Amlodipine Rx   Monitor BPs        Hyperlipidemia   Continue Tricor and Omega 3 Fatty Acids Rx    DVT Prophylaxis   SQ Heparin     Code Status:     FULL CODE        Family Communication:   Daughter at Bedside  Disposition Plan:   Observation Status        Time spent:  59 Minutes      Ron Parker [facility] Pager [phone]   If 7AM -7PM Please Contact the Day Rounding Team MD for [facility]  If 7PM-7AM, Please Contact Night-Floor Coverage  [email or web address] Password [email or web address] 04/14/2016, 5:00 AM     ADDENDUM:   Patient was seen and examined on 04/14/2016

## 2016-04-14 NOTE — ED Provider Notes (Addendum)
By signing my name below, I, Ronney Lion, attest that this documentation has been prepared under the direction and in the presence of Kristen N Ward, DO. Electronically Signed: Ronney Lion, ED Scribe. 04/14/2016. 1:59 AM.  TIME SEEN: 1:25 AM   CHIEF COMPLAINT: SOB  HPI:  HPI Comments: Colleen Mullins is a 70 y.o. female with a history of ESRD (currently undergoing dialysis), HLD, HTN, and arthritis, who presents to the Emergency Department complaining of constant, moderate SOB that began 5 days ago. She denies chest pain. She states her SOB is exacerbated by exertion. She reports she recently had a dialysis catheter in her chest placed in on 03/27/16, about 18 days ago, for ESRD. Patient reports she has gone to dialysis 3x / week for 7 years. States she was previously getting dialysis from a left lower extremity graft which started bleeding and that is why she had the right dialysis catheter placed in her chest.  Patient states she has been to dialysis 3 times this week, but no one was able to tell her why she had SOB. She states her dry weight is normally at 76 kg, but dialysis had taken off extra fluid yesterday, down to 72 kg. She does note having a cold that began about 5 days ago - nasal congestion, dry cough, no fever.  She denies leg swelling, leg pain, fever, or wheezes. Denies history of PE or DVT.  PCP: Zoila Shutter, MD   ROS: See HPI Constitutional: no fever  Eyes: no drainage  ENT: no runny nose   Cardiovascular:  no chest pain  Resp: SOB  GI: no vomiting GU: no dysuria Integumentary: no rash  Allergy: no hives  Musculoskeletal: no leg swelling  Neurological: no slurred speech ROS otherwise negative  PAST MEDICAL HISTORY/PAST SURGICAL HISTORY:  Past Medical History  Diagnosis Date  . Hypertension   . Hyperlipidemia   . GERD (gastroesophageal reflux disease)   . Arthritis   . H/O: Bell's palsy 2007  . Dialysis patient [facility])     M-W-F @ [facility]  . Ejection fraction   .  Shortness of breath dyspnea     with walking  . Diabetes mellitus     type 2  . Chronic kidney disease     Dialysis T/Th/Sa  . Anemia     low iron  . Glaucoma   . Macular degeneration   . Diabetic retinopathy (HCC)     MEDICATIONS:  Prior to Admission medications   Medication Sig Start Date End Date Taking? Authorizing Provider  acetaminophen (TYLENOL) 325 MG tablet Take 650 mg by mouth every 6 (six) hours as needed for moderate pain.    Historical Provider, MD  amLODipine (NORVASC) 10 MG tablet Take 10 mg by mouth at bedtime.     Historical Provider, MD  aspirin EC 81 MG tablet Take 81 mg by mouth at bedtime.     Historical Provider, MD  bisacodyl (DULCOLAX) 5 MG EC tablet Take 5 mg by mouth daily as needed for moderate constipation.    Historical Provider, MD  bismuth subsalicylate (KAOPECTATE) 262 MG/15ML suspension Take 30 mLs by mouth every 6 (six) hours as needed for indigestion or diarrhea or loose stools.    Historical Provider, MD  calcium acetate (PHOSLO) 667 MG capsule Take 1,334 mg by mouth 3 (three) times daily with meals. Takes 2 caps with meals and 1 cap with snacks    Historical Provider, MD  Chlorpheniramine-APAP (CORICIDIN) 2-325 MG TABS Take 2 tablets by mouth  daily as needed (for cold).    Historical Provider, MD  cinacalcet (SENSIPAR) 60 MG tablet Take 60 mg by mouth at bedtime.     Historical Provider, MD  fenofibrate (TRICOR) 145 MG tablet Take 145 mg by mouth every evening.     Historical Provider, MD  fish oil-omega-3 fatty acids 1000 MG capsule Take 1 g by mouth at bedtime.     Historical Provider, MD  insulin aspart (NOVOLOG) 100 UNIT/ML injection Inject 0-9 Units into the skin 3 (three) times daily with meals. 10/18/15   Jeralyn BennettEzequiel Zamora, MD  labetalol (NORMODYNE) 300 MG tablet Take 450 mg by mouth 2 (two) times daily. To take 450 mg BID    Historical Provider, MD  losartan (COZAAR) 100 MG tablet Take 100 mg by mouth every evening.    Historical Provider, MD   multivitamin (RENA-VIT) TABS tablet Take 1 tablet by mouth every evening.     Historical Provider, MD  oxyCODONE (ROXICODONE) 5 MG immediate release tablet Take 1 tablet (5 mg total) by mouth every 6 (six) hours as needed. 03/27/16   Samantha J Rhyne, PA-C  oxyCODONE-acetaminophen (PERCOCET/ROXICET) 5-325 MG tablet Take 1 tablet by mouth every 4 (four) hours as needed for severe pain.    Historical Provider, MD  Travoprost, BAK Free, (TRAVATAN) 0.004 % SOLN ophthalmic solution Place 1 drop into both eyes at bedtime.    Historical Provider, MD    ALLERGIES:  Allergies  Allergen Reactions  . Lactose Intolerance (Gi) Other (See Comments)    Abdominal pains and bloating    SOCIAL HISTORY:  Social History  Substance Use Topics  . Smoking status: Former Smoker    Types: Cigarettes    Quit date: 08/04/1999  . Smokeless tobacco: Never Used  . Alcohol Use: No    FAMILY HISTORY: Family History  Problem Relation Age of Onset  . Diabetes Mother   . Heart disease Mother   . COPD Father   . Hypertension Father   . Lung disease Father   . Diabetes Father   . Diabetes Sister   . Breast cancer Sister     EXAM: BP 171/66 mmHg  Pulse 66  Temp(Src) 98.6 F (37 C) (Oral)  Resp 24  SpO2 99% CONSTITUTIONAL: Alert and oriented and responds appropriately to questions. Elderly, chronically ill-appearing. Afebrile.  HEAD: Normocephalic EYES: Conjunctivae clear, PERRL ENT: normal nose; no rhinorrhea; moist mucous membranes NECK: Supple, no meningismus, no LAD, No JVD  CARD: RRR; S1 and S2 appreciated; no murmurs, no clicks, no rubs, no gallops CHEST:  Dialysis catheter in the right chest wall without surrounding erythema, warmth, drainage, fluctuance or induration RESP: Normal chest excursion without splinting, breath sounds clear and equal bilaterally; no wheezes, no rhonchi, no rales, no hypoxia or respiratory distress, speaking full sentences. Patient has tachypnea. With minimal movement in  the bed, patient seems more tachypneic, only able to speak a few words at a time. ABD/GI: Normal bowel sounds; non-distended; soft, non-tender, no rebound, no guarding, no peritoneal signs BACK:  The back appears normal and is non-tender to palpation, there is no CVA tenderness EXT: Normal ROM in all joints; non-tender to palpation; no edema; normal capillary refill; no cyanosis, no calf tenderness or swelling    SKIN: Normal color for age and race; warm; no rash NEURO: Moves all extremities equally, sensation to light touch intact diffusely, cranial nerves II through XII intact PSYCH: The patient's mood and manner are appropriate. Grooming and personal hygiene are appropriate.  MEDICAL  DECISION MAKING: Patient here shortness of breath. She does appear very short of breath with just minimal movement in the bed. No hypoxia. Her lungs are clear to auscultation with good aeration. She does have a mildly elevated troponin but this appears chronic for her and is in the setting of end-stage renal disease. States that her dry weight is normally 76.2 kg and that after dialysis yesterday morning they took her down to 72 kg. She does not appear volume overloaded currently. Labs otherwise unremarkable. No anemia. EKG shows no new ischemic abnormality. Plan is to obtain a CT of her chest to rule out pulmonary embolus. Chest x-ray shows bilateral infiltrates - infection versus edema.   ED PROGRESS: 5:00 AM  Pt still short of breath with minimal exertion. CT scan is pending but due to level of high acuity in the emergency department patient has been delayed because she is stable. This has been explained to patient and her family. Because I'm concerned that this could also be an anginal equivalent and I feel she will need admission, have discussed this case with the hospitalist Dr. Lovell Sheehan who agrees to admit her to telemetry, observation. She will follow-up on CT imaging. I have discussed this with CT technician and  they state patient will be coming soon for CT scan. At this time I do not think that she needs therapeutic Lovenox that she should be receiving her CT scan soon. We'll wait for CT to come back to decide if she needs treatment for possible pneumonia. Again she has had dry cough and nasal congestion but no fevers and no leukocytosis today. No cough has been appreciated at all in the emergency department. I feel she is stable for a telemetry bed. I will place holding orders for telemetry, observation. Care is being transferred to hospitalist service.     EKG Interpretation  Date/Time:  Saturday Apr 13 2016 21:49:33 EDT Ventricular Rate:  72 PR Interval:  162 QRS Duration: 82 QT Interval:  448 QTC Calculation: 490 R Axis:   -54 Text Interpretation:  Sinus rhythm with Premature atrial complexes Low voltage QRS Left anterior fascicular block Possible Anterolateral infarct , age undetermined Abnormal ECG No significant change since last tracing Confirmed by WARD,  DO, KRISTEN ([id number]) on 04/14/2016 1:37:27 AM        I personally performed the services described in this documentation, which was scribed in my presence. The recorded information has been reviewed and is accurate.   Kristen N Ward, DO 04/14/16 0500   7:00 AM  Pt's CT scan shows no pulmonary embolus. No infectious etiology of her shortness of breath. She does appear to have edema. She will likely need dialysis during this admission to help with her shortness of breath. Admitted to hospital service. She is awaiting bed placement.  Layla Maw Ward, DO 04/14/16 [id number]

## 2016-04-14 NOTE — ED Notes (Signed)
EDP at bedside  

## 2016-04-14 NOTE — Progress Notes (Signed)
Subjective: Patient admitted this morning, see detailed H&P by Dr. Lovell SheehanJenkins 70 y.o. female with a history of ESRD on HD (T,Th,Sat), HTN, DM2 who presents to the ED with complaints of increased SOB x 5 days, She denies any chest pain, but reports pain around the site of her temporary dialysis Catheter that was placed on 05/03. Her last Dialysis treatment was on 05/20 and she reports they removed extra fluid even though she was at her dry weight of 76 kg. She denies missing any of her Dialysis Treatments. She report having cold symptoms for the past 5 days as well. She denies any fevers and chills.  This morning patient denies shortness of breath or chest pain.  Filed Vitals:   04/14/16 0802 04/14/16 1456  BP: 181/72 167/82  Pulse: 89 70  Temp: 98.5 F (36.9 C) 98.5 F (36.9 C)  Resp: 18 22    Chest: Clear Bilaterally Heart : S1S2 RRR Abdomen: Soft, nontender Ext : No edema Neuro: Alert, oriented x 3  A/P Pulmonary edema Elevated troponin ESRD on hemodialysis Essential hypertension  Patient presented with hypertensive urgency, but that edema seen on chest x-ray as well as CT angiogram of chest. No pulmonary embolism seen. Patient also has  mild elevation of troponin 0.21- 0.25- 0.22, likely from demand ischemia. [facility].    Meredeth IdeGagan S Jiovany Scheffel [facility] [phone]- [phone]

## 2016-04-14 NOTE — ED Notes (Signed)
Admitting at bedside 

## 2016-04-15 ENCOUNTER — Observation Stay (HOSPITAL_COMMUNITY): Payer: Medicare HMO

## 2016-04-15 DIAGNOSIS — R0609 Other forms of dyspnea: Secondary | ICD-10-CM | POA: Diagnosis not present

## 2016-04-15 DIAGNOSIS — H3530 Unspecified macular degeneration: Secondary | ICD-10-CM | POA: Diagnosis not present

## 2016-04-15 DIAGNOSIS — R0602 Shortness of breath: Secondary | ICD-10-CM | POA: Diagnosis not present

## 2016-04-15 DIAGNOSIS — E785 Hyperlipidemia, unspecified: Secondary | ICD-10-CM

## 2016-04-15 DIAGNOSIS — N186 End stage renal disease: Secondary | ICD-10-CM | POA: Diagnosis not present

## 2016-04-15 DIAGNOSIS — R7989 Other specified abnormal findings of blood chemistry: Secondary | ICD-10-CM | POA: Diagnosis not present

## 2016-04-15 DIAGNOSIS — E11319 Type 2 diabetes mellitus with unspecified diabetic retinopathy without macular edema: Secondary | ICD-10-CM | POA: Diagnosis not present

## 2016-04-15 DIAGNOSIS — H409 Unspecified glaucoma: Secondary | ICD-10-CM | POA: Diagnosis not present

## 2016-04-15 LAB — BASIC METABOLIC PANEL
Anion gap: 11 (ref 5–15)
BUN: 13 mg/dL (ref 6–20)
CHLORIDE: 96 mmol/L — AB (ref 101–111)
CO2: 29 mmol/L (ref 22–32)
Calcium: 8.5 mg/dL — ABNORMAL LOW (ref 8.9–10.3)
Creatinine, Ser: 2.99 mg/dL — ABNORMAL HIGH (ref 0.44–1.00)
GFR, EST AFRICAN AMERICAN: 17 mL/min — AB (ref >60)
GFR, EST NON AFRICAN AMERICAN: 15 mL/min — AB (ref >60)
Glucose, Bld: 163 mg/dL — ABNORMAL HIGH (ref 65–99)
POTASSIUM: 3.2 mmol/L — AB (ref 3.5–5.1)
SODIUM: 136 mmol/L (ref 135–145)

## 2016-04-15 LAB — CBC
HEMATOCRIT: 35.4 % — AB (ref 36.0–46.0)
Hemoglobin: 10.5 g/dL — ABNORMAL LOW (ref 12.0–15.0)
MCH: 27.3 pg (ref 26.0–34.0)
MCHC: 29.7 g/dL — ABNORMAL LOW (ref 30.0–36.0)
MCV: 91.9 fL (ref 78.0–100.0)
PLATELETS: 353 10*3/uL (ref 150–400)
RBC: 3.85 MIL/uL — AB (ref 3.87–5.11)
RDW: 21.4 % — AB (ref 11.5–15.5)
WBC: 11 10*3/uL — AB (ref 4.0–10.5)

## 2016-04-15 LAB — TROPONIN I: TROPONIN I: 0.26 ng/mL — AB (ref <0.031)

## 2016-04-15 LAB — GLUCOSE, CAPILLARY
GLUCOSE-CAPILLARY: 146 mg/dL — AB (ref 65–99)
GLUCOSE-CAPILLARY: 162 mg/dL — AB (ref 65–99)
GLUCOSE-CAPILLARY: 143 mg/dL — AB (ref 65–99)
GLUCOSE-CAPILLARY: 103 mg/dL — AB (ref 65–99)
Glucose-Capillary: 190 mg/dL — ABNORMAL HIGH (ref 65–99)

## 2016-04-15 LAB — MRSA PCR SCREENING: MRSA by PCR: NEGATIVE

## 2016-04-15 MED ORDER — HYDRALAZINE HCL 25 MG PO TABS: 25.0000 mg | ORAL_TABLET | Freq: Four times a day (QID) | ORAL | Status: DC

## 2016-04-15 MED ORDER — POTASSIUM CHLORIDE CRYS ER 20 MEQ PO TBCR
40.0000 meq | EXTENDED_RELEASE_TABLET | Freq: Once | ORAL | Status: AC
  Administered 2016-04-15: 40 meq via ORAL
  Filled 2016-04-15: qty 2

## 2016-04-15 MED ORDER — LATANOPROST 0.005 % OP SOLN
1.0000 [drp] | Freq: Every day | OPHTHALMIC | Status: DC
  Administered 2016-04-15: 1 [drp] via OPHTHALMIC
  Filled 2016-04-15: qty 2.5

## 2016-04-15 NOTE — Progress Notes (Addendum)
Patient was about to be discharged, says she does not feel well. Denies chest pain, complains of shortness of breath.  On exam; Chest : Clear bilaterally Vitals stable O2 sats 98% RA BP 147/64 Pulse 67 No tachypnea  Will get EKG stat, get another troponin level. It has been elevated due to demand ischemia.Check CXR If elevated, will consult cardiology. Will cancel discharge

## 2016-04-15 NOTE — Progress Notes (Signed)
[facility] Progress Note  Assessment/Plan: 1. SOB/Pul edema - HD Sunday - needs EDW lowered for d/c; CT angio neg PE, + trop likely demand ischemia 2. ESRD - TTS K 3.2 - K supp given 3. Anemia - hgb 10.5 up with volume removal 4. Secondary hyperparathyroidism - Ca /P ok 5. HTN/volume - net UF 4 L Sunday with post weight 71.5; continue to challenge volume  6. Nutrition - abl 2.7-  7. Disp - for d/c today - follow up hgb /lower EDW at d/c/have outpt RD f/u re: low alb and unintentional weight loss  Sheffield Slider, PA-C [facility] Beeper [phone] 04/15/2016,9:42 AM   Patient seen and examined, agree with above note with above modifications. Better with essentially only EDW lowering- will adjust at her home kidney center  Annie Sable, MD 04/15/2016       Subjective:   Feels a little confused - slept later than usual. Usually gets up at 6 am and eats breakfast Breathing ok   Objective Filed Vitals:   04/15/16 0200 04/15/16 0220 04/15/16 0242 04/15/16 0803  BP: 153/78 163/82 164/57 162/65  Pulse: 72 75 75 66  Temp:  98.4 F (36.9 C) 98.7 F (37.1 C) 98.5 F (36.9 C)  TempSrc:  Oral  Oral  Resp: Height:      Weight:  71.5 kg (157 lb 10.1 oz)    SpO2: 100% 100% 99% 99%   Physical Exam General: NAD sats great on room air, A and O x 3 Heart: RRR Lungs: no rales Abdomen: soft NT Extremities: no edema Dialysis Access: left thigh AVGG + bruit maturing and right IJ  Dialysis Orders: SW TTS 4h 73kg 2/2.25 bath P2 Hep 3000 R IJ Cath (L thigh new AVG maturing)  Additional Objective Labs: Basic Metabolic Panel:  Recent Labs Lab 04/13/16 2206 04/14/16 2300 04/15/16 0353  NA 139 133* 136  K 3.2* 3.3* 3.2*  CL 96* 94* 96*  CO2 GLUCOSE 179* 157* 163*  BUN 16 29* 13  CREATININE 2.73* 4.65* 2.99*  CALCIUM 8.3* 7.9* 8.5*  PHOS  --  2.5  --    Liver Function Tests:  Recent Labs Lab 04/14/16 2300   ALBUMIN 2.7*  CBC:  Recent Labs Lab 04/13/16 2206 04/14/16 2300 04/15/16 0353  WBC 9.1 10.4 11.0*  HGB 10.3* 8.5* 10.5*  HCT 34.5* 28.5* 35.4*  MCV 90.3 89.6 91.9  PLT 333 358 353  Cardiac Enzymes:  Recent Labs Lab 04/14/16 0333 04/14/16 0829 04/14/16 1440 04/14/16 2300  TROPONINI 0.21* 0.25* 0.22* 0.24*   CBG:  Recent Labs Lab 04/14/16 1244 04/14/16 1704 04/14/16 2023 04/15/16 0241 04/15/16 0750  GLUCAP 159* 113* 196* 146* 162*    Studies/Results: Dg Chest 2 View  04/13/2016  CLINICAL DATA:  Acute onset of shortness of breath. Initial encounter. EXAM: CHEST  2 VIEW COMPARISON:  Chest radiograph from 03/27/2016 FINDINGS: Small bilateral pleural effusions are seen. Mild bibasilar opacities could reflect minimal interstitial edema. No pneumothorax identified. The cardiomediastinal silhouette is mildly enlarged. No acute osseous abnormalities are seen. A right-sided dual-lumen catheter is noted ending about the distal SVC. IMPRESSION: Small bilateral pleural effusions seen. Mild bibasilar opacities could reflect minimal interstitial edema. Mild cardiomegaly noted. Electronically Signed   By: Roanna Raider M.D.   On: 04/13/2016 23:23   Ct Angio Chest Pe W/cm &/or Wo Cm  04/14/2016  CLINICAL DATA:  Shortness of breath and tachypneic. Dialysis for 7  years. EXAM: CT ANGIOGRAPHY CHEST WITH CONTRAST TECHNIQUE: Multidetector CT imaging of the chest was performed using the standard protocol during bolus administration of intravenous contrast. Multiplanar CT image reconstructions and MIPs were obtained to evaluate the vascular anatomy. CONTRAST:  80 mL Isovue 370 COMPARISON:  None. FINDINGS: Technically adequate study with good opacification of the central and segmental pulmonary arteries. No focal filling defects. No evidence of significant pulmonary embolus. Cardiac enlargement. Coronary artery calcifications. Normal caliber thoracic aorta. Calcification of the aorta. Enlarged lymph  nodes in the pretracheal and aortopulmonic window regions. Lymph nodes measure about 11 mm short axis dimension maximally. There a few calcified lymph nodes. Changes are probably reactive or postinflammatory. Esophagus is decompressed. Thickening of the wall of the distal esophagus may indicate reflux disease. Small bilateral pleural effusions with basilar atelectasis. Patchy ground-glass infiltrates and diffuse interstitial infiltrates throughout the lungs. Changes are likely due to edema. Atelectasis in the lung bases. Peribronchial thickening suggesting bronchitis. Included portions of the upper abdominal organs are unremarkable. Degenerative changes in the spine. Review of the MIP images confirms the above findings. IMPRESSION: Cardiac enlargement with small pleural effusions and airspace and interstitial infiltrates consistent with edema. Changes likely represent congestive failure. No evidence of significant pulmonary embolus. Electronically Signed   By: Burman NievesWilliam  Stevens M.D.   On: 04/14/2016 06:49   Medications:   . amLODipine  10 mg Oral QHS  . aspirin EC  81 mg Oral QHS  . calcium acetate  1,334 mg Oral TID WC  . calcium acetate  667 mg Oral With snacks  . cinacalcet  60 mg Oral QHS  . fenofibrate  160 mg Oral Daily  . heparin  3,000 Units Dialysis Once in dialysis  . heparin  5,000 Units Subcutaneous Q8H  . hydrALAZINE  25 mg Oral Q6H  . insulin aspart  0-5 Units Subcutaneous QHS  . insulin aspart  0-9 Units Subcutaneous TID WC  . labetalol  450 mg Oral BID  . losartan  100 mg Oral QPM  . multivitamin  1 tablet Oral QHS  . omega-3 acid ethyl esters  1 g Oral QHS  . potassium chloride  40 mEq Oral Once  . sodium chloride flush  3 mL Intravenous Q12H  . sodium chloride flush  3 mL Intravenous Q12H

## 2016-04-15 NOTE — Discharge Summary (Addendum)
Physician Discharge Summary  Zafiro Routson ZOX:[id number] DOB: 09/03/1946 DOA: 04/14/2016  PCP: Zoila Shutter, MD  Admit date: 04/14/2016 Discharge date: 04/15/2016  Time spent: 35 minutes  Recommendations for Outpatient Follow-up:  1. Follow up PCP in  2 weeks   Discharge Diagnoses:  Principal Problem:   SOB (shortness of breath) Active Problems:   Type 2 diabetes mellitus with renal complication (HCC)   Elevated troponin   ESRD on dialysis [facility])   Benign essential HTN   Hyperlipidemia   Discharge Condition: Stable  Diet recommendation: Heart healthy diet  Filed Weights   04/14/16 0802 04/14/16 2240 04/15/16 0220  Weight: 75.887 kg (167 lb 4.8 oz) 75.5 kg (166 lb 7.2 oz) 71.5 kg (157 lb 10.1 oz)    History of present illness:  70 y.o. female with a history of ESRD on HD (T,Th,Sat), HTN, DM2 who presents to the ED with complaints of increased SOB x 5 days, She denies any chest pain, but reports pain around the site of her temporary dialysis Catheter that was placed on 05/03. Her last Dialysis treatment was on 05/20 and she reports they removed extra fluid even though she was at her dry weight of 76 kg. She denies missing any of her Dialysis Treatments. She report having cold symptoms for the past 5 days as well. She denies any fevers and chills.   Hospital Course:   Pulmonary edema-Patient presented with pulmonary edema confirmed on chest x-ray as well as CT angiogram of chest Resolved after hemodialysis. Patient was supposed to discharge yesterday, but again complained of shortness of breath. His repeat chest x-ray again showed pulmonary edema. Cardiac enzymes mild elevation 0.26, Discharge was held. And patient underwent hemodialysis today. At this time patient is stable for discharge.  Elevated troponin- patient has mild elevation of troponin 0.21, 0.25, 0.22 likely from demand ischemia, no chest pain. EKG showed no significant changes.  Essential  hypertension/hypertensive urgency- patient was started on hydralazine in addition to all medication labetalol. Blood pressure is controlled. Will continue labetalol and prescribe hydralazine 25 mg by mouth every 6 hours scheduled.  ESRD on hemodialysis- patient on hemodialysis Tuesday Thursday and Saturday, gallbladder is yesterday. Will discharge home with follow-up outpatient dialysis center.,    Procedures:  None  Consultations:  Nephrology  Discharge Exam: Filed Vitals:   04/15/16 0242 04/15/16 0803  BP: 164/57 162/65  Pulse: 75 66  Temp: 98.7 F (37.1 C) 98.5 F (36.9 C)  Resp: 21 20    General: Appears in no acute distress  Cardiovascular: S1-S2 normal, regular rhythm  Respiratory: Clear to auscultation bilaterally  Discharge Instructions   Discharge Instructions    Diet - low sodium heart healthy    Complete by:  As directed      Increase activity slowly    Complete by:  As directed           Current Discharge Medication List    START taking these medications   Details  hydrALAZINE (APRESOLINE) 25 MG tablet Take 1 tablet (25 mg total) by mouth every 6 (six) hours. Qty: 120 tablet, Refills: 2      CONTINUE these medications which have NOT CHANGED   Details  acetaminophen (TYLENOL) 325 MG tablet Take 650 mg by mouth every 6 (six) hours as needed for moderate pain.    amLODipine (NORVASC) 10 MG tablet Take 10 mg by mouth at bedtime.     aspirin EC 81 MG tablet Take 81 mg by mouth at bedtime.  bisacodyl (DULCOLAX) 5 MG EC tablet Take 5 mg by mouth daily as needed for moderate constipation.    bismuth subsalicylate (KAOPECTATE) 262 MG/15ML suspension Take 30 mLs by mouth every 6 (six) hours as needed for indigestion or diarrhea or loose stools.    calcium acetate (PHOSLO) 667 MG capsule Take 1,334 mg by mouth 3 (three) times daily with meals. Takes 2 caps with meals and 1 cap with snacks    Chlorpheniramine-APAP (CORICIDIN) 2-325 MG TABS Take 2  tablets by mouth daily as needed (for cold).    cinacalcet (SENSIPAR) 60 MG tablet Take 60 mg by mouth at bedtime.     fenofibrate (TRICOR) 145 MG tablet Take 145 mg by mouth every evening.     fish oil-omega-3 fatty acids 1000 MG capsule Take 1 g by mouth at bedtime.     insulin aspart (NOVOLOG) 100 UNIT/ML injection Inject 0-9 Units into the skin 3 (three) times daily with meals. Qty: 10 mL, Refills: 11    labetalol (NORMODYNE) 300 MG tablet Take 450 mg by mouth 2 (two) times daily. To take 450 mg BID    losartan (COZAAR) 100 MG tablet Take 100 mg by mouth every evening.    multivitamin (RENA-VIT) TABS tablet Take 1 tablet by mouth every evening.     oxyCODONE-acetaminophen (PERCOCET/ROXICET) 5-325 MG tablet Take 1 tablet by mouth every 4 (four) hours as needed for severe pain.    Travoprost, BAK Free, (TRAVATAN) 0.004 % SOLN ophthalmic solution Place 1 drop into both eyes at bedtime.      STOP taking these medications     oxyCODONE (ROXICODONE) 5 MG immediate release tablet        Allergies  Allergen Reactions  . Lactose Intolerance (Gi) Other (See Comments)    Abdominal pains and bloating      The results of significant diagnostics from this hospitalization (including imaging, microbiology, ancillary and laboratory) are listed below for reference.    Significant Diagnostic Studies: Dg Chest 2 View  04/13/2016  CLINICAL DATA:  Acute onset of shortness of breath. Initial encounter. EXAM: CHEST  2 VIEW COMPARISON:  Chest radiograph from 03/27/2016 FINDINGS: Small bilateral pleural effusions are seen. Mild bibasilar opacities could reflect minimal interstitial edema. No pneumothorax identified. The cardiomediastinal silhouette is mildly enlarged. No acute osseous abnormalities are seen. A right-sided dual-lumen catheter is noted ending about the distal SVC. IMPRESSION: Small bilateral pleural effusions seen. Mild bibasilar opacities could reflect minimal interstitial edema.  Mild cardiomegaly noted. Electronically Signed   By: Roanna Raider M.D.   On: 04/13/2016 23:23   Ct Angio Chest Pe W/cm &/or Wo Cm  04/14/2016  CLINICAL DATA:  Shortness of breath and tachypneic. Dialysis for 7 years. EXAM: CT ANGIOGRAPHY CHEST WITH CONTRAST TECHNIQUE: Multidetector CT imaging of the chest was performed using the standard protocol during bolus administration of intravenous contrast. Multiplanar CT image reconstructions and MIPs were obtained to evaluate the vascular anatomy. CONTRAST:  80 mL Isovue 370 COMPARISON:  None. FINDINGS: Technically adequate study with good opacification of the central and segmental pulmonary arteries. No focal filling defects. No evidence of significant pulmonary embolus. Cardiac enlargement. Coronary artery calcifications. Normal caliber thoracic aorta. Calcification of the aorta. Enlarged lymph nodes in the pretracheal and aortopulmonic window regions. Lymph nodes measure about 11 mm short axis dimension maximally. There a few calcified lymph nodes. Changes are probably reactive or postinflammatory. Esophagus is decompressed. Thickening of the wall of the distal esophagus may indicate reflux disease. Small bilateral pleural  effusions with basilar atelectasis. Patchy ground-glass infiltrates and diffuse interstitial infiltrates throughout the lungs. Changes are likely due to edema. Atelectasis in the lung bases. Peribronchial thickening suggesting bronchitis. Included portions of the upper abdominal organs are unremarkable. Degenerative changes in the spine. Review of the MIP images confirms the above findings. IMPRESSION: Cardiac enlargement with small pleural effusions and airspace and interstitial infiltrates consistent with edema. Changes likely represent congestive failure. No evidence of significant pulmonary embolus. Electronically Signed   By: Burman Nieves M.D.   On: 04/14/2016 06:49   Dg Chest Port 1 View  03/27/2016  CLINICAL DATA:  70 year old with  end-stage renal disease. Immediate postop (postop day 0) dialysis catheter replacement. EXAM: PORTABLE CHEST 1 VIEW COMPARISON:  06/06/2015 and earlier. FINDINGS: Right jugular dialysis catheter tips project over the lower SVC at the cavoatrial junction. No evidence of pneumothorax or mediastinal hematoma. Cardiac silhouette markedly enlarged, unchanged. Pulmonary venous hypertension and perhaps minimal interstitial pulmonary edema. Streaky and patchy airspace opacities in the lung bases, left greater than right, similar to the most recent prior examination. IMPRESSION: 1. Right jugular dialysis catheter tips project at or near the cavoatrial junction. No acute complicating features. 2. Stable marked cardiomegaly. Pulmonary venous hypertension and perhaps minimal interstitial pulmonary edema. 3. Atelectasis versus bronchopneumonia involving the lung bases, left greater than right. Electronically Signed   By: Hulan Saas M.D.   On: 03/27/2016 15:16   Dg Fluoro Guide Cv Line-no Report  03/27/2016  CLINICAL DATA:  FLOURO GUIDE CV LINE Fluoroscopy was utilized by the requesting physician.  No radiographic interpretation.    Microbiology: Recent Results (from the past 240 hour(s))  MRSA PCR Screening     Status: None   Collection Time: 04/14/16 10:31 PM  Result Value Ref Range Status   MRSA by PCR NEGATIVE NEGATIVE Final    Comment:        The GeneXpert MRSA Assay (FDA approved for NASAL specimens only), is one component of a comprehensive MRSA colonization surveillance program. It is not intended to diagnose MRSA infection nor to guide or monitor treatment for MRSA infections.      Labs: Basic Metabolic Panel:  Recent Labs Lab 04/13/16 2206 04/14/16 0829 04/14/16 2300 04/15/16 0353  NA 139  --  133* 136  K 3.2*  --  3.3* 3.2*  CL 96*  --  94* 96*  CO2 29  --  29 29  GLUCOSE 179*  --  157* 163*  BUN 16  --  29* 13  CREATININE 2.73*  --  4.65* 2.99*  CALCIUM 8.3*  --  7.9* 8.5*   MG  --  2.8*  --   --   PHOS  --   --  2.5  --    Liver Function Tests:  Recent Labs Lab 04/14/16 2300  ALBUMIN 2.7*   No results for input(s): LIPASE, AMYLASE in the last 168 hours. No results for input(s): AMMONIA in the last 168 hours. CBC:  Recent Labs Lab 04/13/16 2206 04/14/16 2300 04/15/16 0353  WBC 9.1 10.4 11.0*  HGB 10.3* 8.5* 10.5*  HCT 34.5* 28.5* 35.4*  MCV 90.3 89.6 91.9  PLT 333 358 353   Cardiac Enzymes:  Recent Labs Lab 04/14/16 0333 04/14/16 0829 04/14/16 1440 04/14/16 2300  TROPONINI 0.21* 0.25* 0.22* 0.24*   BNP: BNP (last 3 results) No results for input(s): BNP in the last 8760 hours.  ProBNP (last 3 results) No results for input(s): PROBNP in the last 8760 hours.  CBG:  Recent Labs Lab 04/14/16 1704 04/14/16 2023 04/15/16 0241 04/15/16 0750 04/15/16 1149  GLUCAP 113* 196* 146* 162* 143*       Signed:  Mauro KaufmannLAMA,GAGAN S MD.  [facility] 04/15/2016, 1:26 PM

## 2016-04-15 NOTE — Care Management Obs Status (Addendum)
MEDICARE OBSERVATION STATUS NOTIFICATION   Patient Details  Name: Colleen PlowmanDianne Smelser MRN: [id number] Date of Birth: 09/05/1946   Medicare Observation Status Notification Given:  Yes  Letter of explanation left at bedside as pt sleeping soundly unable to awaken by calling name.   Keilani Terrance, Annamarie MajorCheryl U, RN 04/15/2016, 3:00 PM

## 2016-04-16 DIAGNOSIS — R0609 Other forms of dyspnea: Secondary | ICD-10-CM | POA: Diagnosis not present

## 2016-04-16 LAB — HEMOGLOBIN A1C
HEMOGLOBIN A1C: 6.4 % — AB (ref 4.8–5.6)
Mean Plasma Glucose: 137 mg/dL

## 2016-04-16 LAB — TROPONIN I
TROPONIN I: 0.26 ng/mL — AB (ref <0.031)
TROPONIN I: 0.25 ng/mL — AB (ref <0.031)
TROPONIN I: 0.25 ng/mL — AB (ref <0.031)

## 2016-04-16 LAB — CBC
HCT: 33.4 % — ABNORMAL LOW (ref 36.0–46.0)
Hemoglobin: 9.9 g/dL — ABNORMAL LOW (ref 12.0–15.0)
MCH: 27 pg (ref 26.0–34.0)
MCHC: 29.6 g/dL — ABNORMAL LOW (ref 30.0–36.0)
MCV: 91 fL (ref 78.0–100.0)
Platelets: 433 10*3/uL — ABNORMAL HIGH (ref 150–400)
RBC: 3.67 MIL/uL — ABNORMAL LOW (ref 3.87–5.11)
RDW: 22.2 % — ABNORMAL HIGH (ref 11.5–15.5)
WBC: 8 10*3/uL (ref 4.0–10.5)

## 2016-04-16 LAB — GLUCOSE, CAPILLARY
GLUCOSE-CAPILLARY: 162 mg/dL — AB (ref 65–99)
Glucose-Capillary: 178 mg/dL — ABNORMAL HIGH (ref 65–99)

## 2016-04-16 LAB — RENAL FUNCTION PANEL
Albumin: 2.9 g/dL — ABNORMAL LOW (ref 3.5–5.0)
Anion gap: 9 (ref 5–15)
BUN: 31 mg/dL — ABNORMAL HIGH (ref 6–20)
CO2: 27 mmol/L (ref 22–32)
Calcium: 8.8 mg/dL — ABNORMAL LOW (ref 8.9–10.3)
Chloride: 95 mmol/L — ABNORMAL LOW (ref 101–111)
Creatinine, Ser: 5.82 mg/dL — ABNORMAL HIGH (ref 0.44–1.00)
GFR calc Af Amer: 8 mL/min — ABNORMAL LOW (ref >60)
GFR calc non Af Amer: 7 mL/min — ABNORMAL LOW (ref >60)
Glucose, Bld: 197 mg/dL — ABNORMAL HIGH (ref 65–99)
Phosphorus: 2 mg/dL — ABNORMAL LOW (ref 2.5–4.6)
Potassium: 4.8 mmol/L (ref 3.5–5.1)
Sodium: 131 mmol/L — ABNORMAL LOW (ref 135–145)

## 2016-04-16 MED ORDER — ALTEPLASE 2 MG IJ SOLR: 2.0000 mg | Freq: Once | INTRAMUSCULAR | Status: DC | PRN

## 2016-04-16 MED ORDER — HEPARIN SODIUM (PORCINE) 1000 UNIT/ML DIALYSIS
3000.0000 [IU] | Freq: Once | INTRAMUSCULAR | Status: AC
  Administered 2016-04-16: 3000 [IU] via INTRAVENOUS_CENTRAL

## 2016-04-16 MED ORDER — SODIUM CHLORIDE 0.9 % IV SOLN: 100.0000 mL | INTRAVENOUS | Status: DC | PRN

## 2016-04-16 MED ORDER — LIDOCAINE-PRILOCAINE 2.5-2.5 % EX CREA: 1.0000 "application " | TOPICAL_CREAM | CUTANEOUS | Status: DC | PRN

## 2016-04-16 MED ORDER — HEPARIN SODIUM (PORCINE) 1000 UNIT/ML DIALYSIS: 1000.0000 [IU] | INTRAMUSCULAR | Status: DC | PRN

## 2016-04-16 MED ORDER — LIDOCAINE HCL (PF) 1 % IJ SOLN: 5.0000 mL | INTRAMUSCULAR | Status: DC | PRN

## 2016-04-16 MED ORDER — PENTAFLUOROPROP-TETRAFLUOROETH EX AERO: 1.0000 "application " | INHALATION_SPRAY | CUTANEOUS | Status: DC | PRN

## 2016-04-16 NOTE — Progress Notes (Signed)
Patient Discharge: Disposition: Patient is discharged home with daugher Education: Educated about medications, prescriptions, insulin, diet, discharge instructions IV: Right HD catheter intact, clean, and dry Telemetry: Discontinued before discharge, CCMD notified. Belongings: Patient took all his belongings with her and daughter.

## 2016-04-16 NOTE — Progress Notes (Signed)
[facility] Progress Note  Assessment/Plan: 1. SOB/Pul edema - H; CT angio neg PE, + trop likely demand ischemia; HD today - continue to lower volume with lower EDW for d/c 2. ESRD - TTS K 3.2 - K supp given- start on 4 K bath today and adjust pending K results 3. Anemia - hgb 10.5 up with volume removal - last mircera was 225 5/18 last outpt Hgb 8.7 5/18 4. Secondary hyperparathyroidism - Ca /P ok 5. HTN/volume - net UF 4 L Sunday with post weight 71.5; continue to challenge volume  6. Nutrition - abl 2.7-  7. Disp - d/c cancelled Monday due to pt not feeling well - CXR showed persistent pul edema; trop flat; OK for d/c today post HD  Sheffield Slider, PA-C [facility] Beeper [phone] 04/16/2016,10:21 AM   Patient seen and examined, agree with above note with above modifications. As above discharge was postponed due to her breathing- needs EDW down more which likely could have been done as OP but will need to stay on top of it Annie Sable, MD 04/16/2016       Subjective:   No c/o feels better; said she was SOB yesterday and her CXR showed fluid but her sats were fine yesterday and today  Objective Filed Vitals:   04/15/16 2126 04/16/16 0442 04/16/16 0443 04/16/16 0827  BP: 182/62 161/62 151/51 150/55  Pulse:  67  64  Temp:  98.4 F (36.9 C)  98.3 F (36.8 C)  TempSrc:  Oral  Oral  Resp:  16  17  Height:      Weight:      SpO2:  99%  99%   Physical Exam General: NAD Heart: RRR Lungs:  Few crackles left base Abdomen: soft NT Extremities: no LE edema Dialysis Access:  Left thigh graft maturing - right IJ  Dialysis Orders: SW TTS 4h 73kg 2/2.25 bath P2 Hep 3000 R IJ Cath (L thigh new AVG maturing)  Additional Objective Labs: Basic Metabolic Panel:  Recent Labs Lab 04/13/16 2206 04/14/16 2300 04/15/16 0353  NA 139 133* 136  K 3.2* 3.3* 3.2*  CL 96* 94* 96*  CO2 GLUCOSE 179* 157* 163*  BUN 16 29* 13   CREATININE 2.73* 4.65* 2.99*  CALCIUM 8.3* 7.9* 8.5*  PHOS  --  2.5  --    Liver Function Tests:  Recent Labs Lab 04/14/16 2300  ALBUMIN 2.7*   CBC:  Recent Labs Lab 04/13/16 2206 04/14/16 2300 04/15/16 0353  WBC 9.1 10.4 11.0*  HGB 10.3* 8.5* 10.5*  HCT 34.5* 28.5* 35.4*  MCV 90.3 89.6 91.9  PLT 333 358 353    Cardiac Enzymes:  Recent Labs Lab 04/14/16 0829 04/14/16 1440 04/14/16 2300 04/15/16 2203 04/16/16 0403  TROPONINI 0.25* 0.22* 0.24* 0.26* 0.26*   CBG:  Recent Labs Lab 04/15/16 0750 04/15/16 1149 04/15/16 1631 04/15/16 2130 04/16/16 0826  GLUCAP 162* 143* 190* 103* 162*   Studies/Results: Dg Chest 2 View  04/15/2016  CLINICAL DATA:  Acute onset of shortness of breath. Initial encounter. EXAM: CHEST  2 VIEW COMPARISON:  Chest radiograph from 04/13/2016, and CTA of the chest performed 04/14/2016 FINDINGS: The lungs are well-aerated. Known small bilateral pleural effusions are better characterized on recent CTA. Vascular congestion and mildly increased interstitial markings raise concern for mild interstitial edema. There is no evidence of pneumothorax. The heart is enlarged. No acute osseous abnormalities are seen. A right-sided dual-lumen catheter is noted ending about  the distal SVC. IMPRESSION: Known small bilateral pleural effusions are better characterized on recent CTA. Vascular congestion, cardiomegaly and mildly increased interstitial markings raise concern for mild interstitial edema. Electronically Signed   By: Roanna RaiderJeffery  Chang M.D.   On: 04/15/2016 19:44   Medications:   . amLODipine  10 mg Oral QHS  . aspirin EC  81 mg Oral QHS  . calcium acetate  1,334 mg Oral TID WC  . calcium acetate  667 mg Oral With snacks  . cinacalcet  60 mg Oral QHS  . fenofibrate  160 mg Oral Daily  . heparin  5,000 Units Subcutaneous Q8H  . hydrALAZINE  25 mg Oral Q6H  . insulin aspart  0-5 Units Subcutaneous QHS  . insulin aspart  0-9 Units Subcutaneous TID WC   . labetalol  450 mg Oral BID  . latanoprost  1 drop Both Eyes QHS  . losartan  100 mg Oral QPM  . multivitamin  1 tablet Oral QHS  . omega-3 acid ethyl esters  1 g Oral QHS  . sodium chloride flush  3 mL Intravenous Q12H  . sodium chloride flush  3 mL Intravenous Q12H

## 2016-04-23 ENCOUNTER — Encounter: Payer: Self-pay | Admitting: Vascular Surgery

## 2016-04-24 NOTE — Progress Notes (Signed)
    Postoperative Access Visit   History of Present Illness  Colleen PlowmanDianne Mullins is a 70 y.o. year old female who presents for postoperative follow-up for: RIJV TDC exchange x 2, L thigh AVG revision (Date: 03/27/16).  The patient's wounds are healed.  The patient notes no steal symptoms.  The patient is able to complete their activities of daily living.  The patient's current symptoms are: none.  For VQI Use Only  PRE-ADM LIVING: Home  AMB STATUS: Ambulatory  Physical Examination Filed Vitals:   04/26/16 0914 04/26/16 0916  BP: 159/78 162/60  Pulse: 63     LLE: Incisions are healed, skin feels warm, hand grip is 5/5, sensation in digits is intact, palpable thrill, bruit can be auscultated    Medical Decision Making  Colleen PlowmanDianne Mullins is a 70 y.o. year old female who presents s/p RIJV TDC exchange x 2, L thigh AVG revision .  The patient's access is ready for use.  The patient's tunneled dialysis catheter can be removed after two successful cannulations and completed dialysis treatments.  Thank you for allowing us to participate in this patient's care.   Leonides SakeBrian Chen, MD [facility] Office: [phone] Pager: [phone]  04/24/2016, 8:34 AM

## 2016-04-26 ENCOUNTER — Encounter: Payer: Self-pay | Admitting: Vascular Surgery

## 2016-04-26 ENCOUNTER — Ambulatory Visit (INDEPENDENT_AMBULATORY_CARE_PROVIDER_SITE_OTHER): Payer: Medicare HMO | Admitting: Vascular Surgery

## 2016-04-26 VITALS — BP 162/60 | HR 63 | Ht 66.0 in | Wt 167.0 lb

## 2016-04-26 DIAGNOSIS — T82898D Other specified complication of vascular prosthetic devices, implants and grafts, subsequent encounter: Secondary | ICD-10-CM

## 2016-04-26 DIAGNOSIS — T82511D Breakdown (mechanical) of surgically created arteriovenous shunt, subsequent encounter: Secondary | ICD-10-CM

## 2016-06-12 ENCOUNTER — Ambulatory Visit
Admission: RE | Admit: 2016-06-12 | Discharge: 2016-06-12 | Disposition: A | Discharge status: Home / Self Care | Payer: Medicare HMO | Source: Ambulatory Visit | Attending: Nephrology | Admitting: Nephrology

## 2016-06-12 ENCOUNTER — Other Ambulatory Visit: Payer: Self-pay | Admitting: Nephrology

## 2016-06-12 DIAGNOSIS — R0602 Shortness of breath: Secondary | ICD-10-CM

## 2016-07-07 ENCOUNTER — Encounter (HOSPITAL_COMMUNITY): Payer: Self-pay

## 2016-07-07 ENCOUNTER — Emergency Department (HOSPITAL_COMMUNITY): Payer: Medicare HMO

## 2016-07-07 ENCOUNTER — Observation Stay (HOSPITAL_COMMUNITY)
Admission: EM | Admit: 2016-07-07 | Discharge: 2016-07-11 | Disposition: A | Discharge status: Home / Self Care | Payer: Medicare HMO | Attending: Internal Medicine | Admitting: Internal Medicine

## 2016-07-07 DIAGNOSIS — Z992 Dependence on renal dialysis: Secondary | ICD-10-CM | POA: Insufficient documentation

## 2016-07-07 DIAGNOSIS — R7989 Other specified abnormal findings of blood chemistry: Secondary | ICD-10-CM | POA: Insufficient documentation

## 2016-07-07 DIAGNOSIS — E119 Type 2 diabetes mellitus without complications: Secondary | ICD-10-CM

## 2016-07-07 DIAGNOSIS — Z794 Long term (current) use of insulin: Secondary | ICD-10-CM | POA: Diagnosis not present

## 2016-07-07 DIAGNOSIS — Z87891 Personal history of nicotine dependence: Secondary | ICD-10-CM | POA: Diagnosis not present

## 2016-07-07 DIAGNOSIS — R778 Other specified abnormalities of plasma proteins: Secondary | ICD-10-CM | POA: Diagnosis present

## 2016-07-07 DIAGNOSIS — K219 Gastro-esophageal reflux disease without esophagitis: Secondary | ICD-10-CM | POA: Diagnosis not present

## 2016-07-07 DIAGNOSIS — M25512 Pain in left shoulder: Secondary | ICD-10-CM | POA: Diagnosis not present

## 2016-07-07 DIAGNOSIS — H409 Unspecified glaucoma: Secondary | ICD-10-CM | POA: Insufficient documentation

## 2016-07-07 DIAGNOSIS — D649 Anemia, unspecified: Secondary | ICD-10-CM | POA: Insufficient documentation

## 2016-07-07 DIAGNOSIS — E785 Hyperlipidemia, unspecified: Secondary | ICD-10-CM | POA: Diagnosis not present

## 2016-07-07 DIAGNOSIS — I2510 Atherosclerotic heart disease of native coronary artery without angina pectoris: Secondary | ICD-10-CM | POA: Diagnosis not present

## 2016-07-07 DIAGNOSIS — I120 Hypertensive chronic kidney disease with stage 5 chronic kidney disease or end stage renal disease: Secondary | ICD-10-CM | POA: Diagnosis not present

## 2016-07-07 DIAGNOSIS — E11319 Type 2 diabetes mellitus with unspecified diabetic retinopathy without macular edema: Secondary | ICD-10-CM | POA: Insufficient documentation

## 2016-07-07 DIAGNOSIS — N186 End stage renal disease: Secondary | ICD-10-CM | POA: Insufficient documentation

## 2016-07-07 DIAGNOSIS — M1990 Unspecified osteoarthritis, unspecified site: Secondary | ICD-10-CM | POA: Diagnosis not present

## 2016-07-07 DIAGNOSIS — M25561 Pain in right knee: Secondary | ICD-10-CM | POA: Diagnosis not present

## 2016-07-07 DIAGNOSIS — Z833 Family history of diabetes mellitus: Secondary | ICD-10-CM | POA: Insufficient documentation

## 2016-07-07 DIAGNOSIS — Z7982 Long term (current) use of aspirin: Secondary | ICD-10-CM | POA: Diagnosis not present

## 2016-07-07 DIAGNOSIS — E1129 Type 2 diabetes mellitus with other diabetic kidney complication: Secondary | ICD-10-CM | POA: Diagnosis present

## 2016-07-07 DIAGNOSIS — H3530 Unspecified macular degeneration: Secondary | ICD-10-CM | POA: Diagnosis not present

## 2016-07-07 DIAGNOSIS — E1122 Type 2 diabetes mellitus with diabetic chronic kidney disease: Secondary | ICD-10-CM | POA: Insufficient documentation

## 2016-07-07 DIAGNOSIS — E11649 Type 2 diabetes mellitus with hypoglycemia without coma: Secondary | ICD-10-CM | POA: Diagnosis not present

## 2016-07-07 DIAGNOSIS — R0789 Other chest pain: Secondary | ICD-10-CM | POA: Diagnosis not present

## 2016-07-07 DIAGNOSIS — Z8249 Family history of ischemic heart disease and other diseases of the circulatory system: Secondary | ICD-10-CM | POA: Insufficient documentation

## 2016-07-07 DIAGNOSIS — D631 Anemia in chronic kidney disease: Secondary | ICD-10-CM

## 2016-07-07 DIAGNOSIS — R9439 Abnormal result of other cardiovascular function study: Secondary | ICD-10-CM

## 2016-07-07 DIAGNOSIS — N189 Chronic kidney disease, unspecified: Secondary | ICD-10-CM

## 2016-07-07 DIAGNOSIS — I110 Hypertensive heart disease with heart failure: Secondary | ICD-10-CM | POA: Diagnosis present

## 2016-07-07 DIAGNOSIS — R079 Chest pain, unspecified: Secondary | ICD-10-CM

## 2016-07-07 HISTORY — DX: End stage renal disease: Z99.2

## 2016-07-07 HISTORY — DX: End stage renal disease: N18.6

## 2016-07-07 LAB — CBC WITH DIFFERENTIAL/PLATELET
Basophils Absolute: 0 10*3/uL (ref 0.0–0.1)
Basophils Relative: 0 %
EOS ABS: 0.1 10*3/uL (ref 0.0–0.7)
EOS PCT: 1 %
HCT: 33.3 % — ABNORMAL LOW (ref 36.0–46.0)
Hemoglobin: 10.3 g/dL — ABNORMAL LOW (ref 12.0–15.0)
LYMPHS ABS: 1.8 10*3/uL (ref 0.7–4.0)
Lymphocytes Relative: 25 %
MCH: 26.8 pg (ref 26.0–34.0)
MCHC: 30.9 g/dL (ref 30.0–36.0)
MCV: 86.7 fL (ref 78.0–100.0)
MONOS PCT: 5 %
Monocytes Absolute: 0.4 10*3/uL (ref 0.1–1.0)
Neutro Abs: 4.9 10*3/uL (ref 1.7–7.7)
Neutrophils Relative %: 69 %
PLATELETS: 313 10*3/uL (ref 150–400)
RBC: 3.84 MIL/uL — ABNORMAL LOW (ref 3.87–5.11)
RDW: 17.6 % — AB (ref 11.5–15.5)
WBC: 7 10*3/uL (ref 4.0–10.5)

## 2016-07-07 LAB — CBC
HEMATOCRIT: 33.2 % — AB (ref 36.0–46.0)
HEMOGLOBIN: 10.1 g/dL — AB (ref 12.0–15.0)
MCH: 26.4 pg (ref 26.0–34.0)
MCHC: 30.4 g/dL (ref 30.0–36.0)
MCV: 86.7 fL (ref 78.0–100.0)
Platelets: 319 10*3/uL (ref 150–400)
RBC: 3.83 MIL/uL — AB (ref 3.87–5.11)
RDW: 17.7 % — ABNORMAL HIGH (ref 11.5–15.5)
WBC: 7.3 10*3/uL (ref 4.0–10.5)

## 2016-07-07 LAB — COMPREHENSIVE METABOLIC PANEL
ALT: 20 U/L (ref 14–54)
ANION GAP: 10 (ref 5–15)
AST: 27 U/L (ref 15–41)
Albumin: 3.5 g/dL (ref 3.5–5.0)
Alkaline Phosphatase: 60 U/L (ref 38–126)
BILIRUBIN TOTAL: 0.4 mg/dL (ref 0.3–1.2)
BUN: 49 mg/dL — AB (ref 6–20)
CALCIUM: 8.1 mg/dL — AB (ref 8.9–10.3)
CO2: 31 mmol/L (ref 22–32)
Chloride: 95 mmol/L — ABNORMAL LOW (ref 101–111)
Creatinine, Ser: 4.83 mg/dL — ABNORMAL HIGH (ref 0.44–1.00)
GFR calc Af Amer: 10 mL/min — ABNORMAL LOW (ref >60)
GFR, EST NON AFRICAN AMERICAN: 8 mL/min — AB (ref >60)
GLUCOSE: 297 mg/dL — AB (ref 65–99)
POTASSIUM: 4 mmol/L (ref 3.5–5.1)
SODIUM: 136 mmol/L (ref 135–145)
TOTAL PROTEIN: 6.5 g/dL (ref 6.5–8.1)

## 2016-07-07 LAB — BASIC METABOLIC PANEL
ANION GAP: 9 (ref 5–15)
BUN: 52 mg/dL — ABNORMAL HIGH (ref 6–20)
CALCIUM: 8.2 mg/dL — AB (ref 8.9–10.3)
CO2: 30 mmol/L (ref 22–32)
Chloride: 97 mmol/L — ABNORMAL LOW (ref 101–111)
Creatinine, Ser: 5.05 mg/dL — ABNORMAL HIGH (ref 0.44–1.00)
GFR, EST AFRICAN AMERICAN: 9 mL/min — AB (ref >60)
GFR, EST NON AFRICAN AMERICAN: 8 mL/min — AB (ref >60)
GLUCOSE: 195 mg/dL — AB (ref 65–99)
POTASSIUM: 4.2 mmol/L (ref 3.5–5.1)
Sodium: 136 mmol/L (ref 135–145)

## 2016-07-07 LAB — MRSA PCR SCREENING: MRSA BY PCR: NEGATIVE

## 2016-07-07 LAB — I-STAT TROPONIN, ED: TROPONIN I, POC: 0.07 ng/mL (ref 0.00–0.08)

## 2016-07-07 LAB — TROPONIN I
TROPONIN I: 0.22 ng/mL — AB (ref <0.03)
TROPONIN I: 0.23 ng/mL — AB (ref <0.03)

## 2016-07-07 LAB — GLUCOSE, CAPILLARY
GLUCOSE-CAPILLARY: 221 mg/dL — AB (ref 65–99)
GLUCOSE-CAPILLARY: 147 mg/dL — AB (ref 65–99)

## 2016-07-07 LAB — LIPASE, BLOOD: LIPASE: 48 U/L (ref 11–51)

## 2016-07-07 MED ORDER — PANTOPRAZOLE SODIUM 40 MG PO TBEC
40.0000 mg | DELAYED_RELEASE_TABLET | Freq: Every day | ORAL | Status: DC
  Administered 2016-07-07 – 2016-07-11 (×5): 40 mg via ORAL
  Filled 2016-07-07 (×5): qty 1

## 2016-07-07 MED ORDER — BISACODYL 5 MG PO TBEC
5.0000 mg | DELAYED_RELEASE_TABLET | Freq: Every day | ORAL | Status: DC | PRN
Start: 2016-07-07 — End: 2016-07-11

## 2016-07-07 MED ORDER — AMLODIPINE BESYLATE 10 MG PO TABS
10.0000 mg | ORAL_TABLET | Freq: Every day | ORAL | Status: DC
  Administered 2016-07-07 – 2016-07-10 (×5): 10 mg via ORAL
  Filled 2016-07-07: qty 2
  Filled 2016-07-07 (×4): qty 1

## 2016-07-07 MED ORDER — ACETAMINOPHEN 325 MG PO TABS
650.0000 mg | ORAL_TABLET | Freq: Once | ORAL | Status: DC
  Filled 2016-07-07: qty 2

## 2016-07-07 MED ORDER — HYDROMORPHONE HCL 2 MG PO TABS
1.0000 mg | ORAL_TABLET | Freq: Once | ORAL | Status: AC
  Administered 2016-07-07: 1 mg via ORAL
  Filled 2016-07-07: qty 1

## 2016-07-07 MED ORDER — BISMUTH SUBSALICYLATE 262 MG/15ML PO SUSP: 30.0000 mL | Freq: Four times a day (QID) | ORAL | Status: DC | PRN

## 2016-07-07 MED ORDER — PANTOPRAZOLE SODIUM 20 MG PO TBEC: 20.0000 mg | DELAYED_RELEASE_TABLET | Freq: Every day | ORAL | Status: DC

## 2016-07-07 MED ORDER — ACETAMINOPHEN 650 MG RE SUPP: 650.0000 mg | Freq: Four times a day (QID) | RECTAL | Status: DC | PRN

## 2016-07-07 MED ORDER — OMEGA-3 FATTY ACIDS 1000 MG PO CAPS: 1.0000 g | ORAL_CAPSULE | Freq: Every day | ORAL | Status: DC

## 2016-07-07 MED ORDER — CINACALCET HCL 30 MG PO TABS
60.0000 mg | ORAL_TABLET | Freq: Every day | ORAL | Status: DC
  Administered 2016-07-07: 60 mg via ORAL
  Filled 2016-07-07: qty 2

## 2016-07-07 MED ORDER — INSULIN ASPART 100 UNIT/ML ~~LOC~~ SOLN
0.0000 [IU] | Freq: Three times a day (TID) | SUBCUTANEOUS | Status: DC
  Administered 2016-07-07: 5 [IU] via SUBCUTANEOUS
  Administered 2016-07-08 (×2): 1 [IU] via SUBCUTANEOUS
  Administered 2016-07-08: 5 [IU] via SUBCUTANEOUS
  Administered 2016-07-09: 3 [IU] via SUBCUTANEOUS
  Administered 2016-07-09: 1 [IU] via SUBCUTANEOUS
  Administered 2016-07-10 (×2): 3 [IU] via SUBCUTANEOUS
  Administered 2016-07-10 – 2016-07-11 (×2): 2 [IU] via SUBCUTANEOUS

## 2016-07-07 MED ORDER — RENA-VITE PO TABS
1.0000 | ORAL_TABLET | Freq: Every evening | ORAL | Status: DC
  Administered 2016-07-08 – 2016-07-10 (×3): 1 via ORAL
  Filled 2016-07-07 (×4): qty 1

## 2016-07-07 MED ORDER — ACETAMINOPHEN 325 MG PO TABS: 650.0000 mg | ORAL_TABLET | Freq: Four times a day (QID) | ORAL | Status: DC | PRN

## 2016-07-07 MED ORDER — HEPARIN SODIUM (PORCINE) 5000 UNIT/ML IJ SOLN
5000.0000 [IU] | Freq: Three times a day (TID) | INTRAMUSCULAR | Status: DC
  Administered 2016-07-07 – 2016-07-08 (×4): 5000 [IU] via SUBCUTANEOUS
  Filled 2016-07-07 (×5): qty 1

## 2016-07-07 MED ORDER — OXYCODONE-ACETAMINOPHEN 5-325 MG PO TABS
1.0000 | ORAL_TABLET | ORAL | Status: DC | PRN
  Administered 2016-07-07 – 2016-07-11 (×13): 1 via ORAL
  Filled 2016-07-07 (×11): qty 1

## 2016-07-07 MED ORDER — ASPIRIN EC 81 MG PO TBEC
81.0000 mg | DELAYED_RELEASE_TABLET | Freq: Every day | ORAL | Status: DC
  Administered 2016-07-08 – 2016-07-10 (×3): 81 mg via ORAL
  Filled 2016-07-07 (×3): qty 1

## 2016-07-07 MED ORDER — INSULIN ASPART 100 UNIT/ML ~~LOC~~ SOLN
3.0000 [IU] | Freq: Three times a day (TID) | SUBCUTANEOUS | Status: DC
  Administered 2016-07-07 – 2016-07-11 (×7): 3 [IU] via SUBCUTANEOUS

## 2016-07-07 MED ORDER — LOSARTAN POTASSIUM 50 MG PO TABS
100.0000 mg | ORAL_TABLET | Freq: Every evening | ORAL | Status: DC
  Administered 2016-07-07 – 2016-07-10 (×4): 100 mg via ORAL
  Filled 2016-07-07 (×4): qty 2

## 2016-07-07 MED ORDER — FENOFIBRATE 160 MG PO TABS
160.0000 mg | ORAL_TABLET | Freq: Every day | ORAL | Status: DC
  Administered 2016-07-07 – 2016-07-11 (×5): 160 mg via ORAL
  Filled 2016-07-07 (×5): qty 1

## 2016-07-07 MED ORDER — OMEGA-3-ACID ETHYL ESTERS 1 G PO CAPS
1.0000 g | ORAL_CAPSULE | Freq: Every day | ORAL | Status: DC
  Administered 2016-07-07 – 2016-07-10 (×4): 1 g via ORAL
  Filled 2016-07-07 (×5): qty 1

## 2016-07-07 MED ORDER — CALCIUM ACETATE (PHOS BINDER) 667 MG PO CAPS
1334.0000 mg | ORAL_CAPSULE | Freq: Three times a day (TID) | ORAL | Status: DC
  Filled 2016-07-07: qty 2

## 2016-07-07 MED ORDER — LATANOPROST 0.005 % OP SOLN
1.0000 [drp] | Freq: Every day | OPHTHALMIC | Status: DC
  Administered 2016-07-07 – 2016-07-10 (×4): 1 [drp] via OPHTHALMIC
  Filled 2016-07-07: qty 2.5

## 2016-07-07 NOTE — ED Notes (Signed)
Dr. Ladona Ridgelaylor called, orders to give Losartan and Amlodipine now.

## 2016-07-07 NOTE — ED Notes (Signed)
Dr. Tasia CatchingsAhmed paged for stepdown bed orders.  Made aware of Troponin 0.22.

## 2016-07-07 NOTE — ED Notes (Signed)
Patient transported to X-ray 

## 2016-07-07 NOTE — ED Provider Notes (Signed)
[facility]-EMERGENCY DEPT Provider Note   CSN: [id number] Arrival date & time: 07/07/16  1102  First Provider Contact:  First MD Initiated Contact with Patient 07/07/16 1111        History   Chief Complaint Chief Complaint  Patient presents with  . Chest Pain    HPI Colleen Mullins is a 70 y.o. female.  The history is provided by the patient. No language interpreter was used.  Chest Pain     Colleen Mullins is a 70 y.o. female who presents to the Emergency Department complaining of CP.  She presents by EMS for evaluation of chest pain that started 1 hour prior to ED arrival. She reports sharp, central chest pain. She has associated nausea. She initially thought it was reflux but this pain is much worse than her typical reflux and normally does not have nausea associated with it. She has experienced intermittent chest pain over the last several weeks. She has chronic shortness of breath, unchanged from baseline. Her chest pain was worse with exertion today. No diaphoresis, fever, vomiting, abdominal pain. She has end-stage renal disease and is on hemodialysis Tuesday, Thursday, Saturday. Her last dialysis session was yesterday and there were no difficulties with the session. She has no known cardiac disease.  Past Medical History:  Diagnosis Date  . Anemia    low iron  . Arthritis   . Chronic kidney disease    Dialysis T/Th/Sa  . Diabetes mellitus    type 2  . Diabetic retinopathy (HCC)   . Dialysis patient [facility])    M-W-F @ [facility]  . Ejection fraction   . GERD (gastroesophageal reflux disease)   . Glaucoma   . H/O: Bell's palsy 2007  . Hyperlipidemia   . Hypertension   . Macular degeneration   . Shortness of breath dyspnea    with walking    Patient Active Problem List   Diagnosis Date Noted  . Chest pain 07/07/2016  . Dyspnea on exertion 04/14/2016  . SOB (shortness of breath) 04/14/2016  . ESRD on dialysis (HCC) 04/14/2016  . Benign essential HTN 04/14/2016  .  Hyperlipidemia 04/14/2016  . 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 Hypoglycemia 10/16/2015  . Elevated troponin 10/16/2015  . Hypertensive urgency 10/16/2015  . Ejection fraction   . Type 2 diabetes mellitus with renal complication (HCC) 09/26/2011  . Other complications due to renal dialysis device, implant, and graft 09/26/2011  . End stage renal disease (HCC) 09/26/2011    Past Surgical History:  Procedure Laterality Date  . ABDOMINAL HYSTERECTOMY    . ARTERIOVENOUS GRAFT PLACEMENT     Left forearm x 2, one removed in March  . ARTERIOVENOUS GRAFT PLACEMENT     Left forearm  . AVGG REMOVAL  10/08/2011   Procedure: REMOVAL OF ARTERIOVENOUS GORETEX GRAFT (AVGG);  Surgeon: Sherren Kerns, MD;  Location: [facility] OR;  Service: Vascular;  Laterality: Left;  . BTL    . COLONOSCOPY    . EXCHANGE OF A DIALYSIS CATHETER Right 03/27/2016   Procedure: EXCHANGE OF A DIALYSIS CATHETER;  Surgeon: Fransisco Hertz, MD;  Location: [facility] OR;  Service: Vascular;  Laterality: Right;  . EYE SURGERY Left    Lasik surgery on left. Cataract surgery on both eyes  . LAPAROTOMY  ?2000   Pelvic mass (Benign)  . REVISION OF ARTERIOVENOUS GORETEX GRAFT Left 03/27/2016   Procedure: REVISION OF LEFT ARTERIOVENOUS GORETEX GRAFT;  Surgeon: Fransisco Hertz, MD;  Location[facility] OR;  Service: Vascular;  Laterality: Left;  . TONSILLECTOMY      OB History    No data available       Home Medications    Prior to Admission medications   Medication Sig Start Date End Date Taking? Authorizing Provider  acetaminophen (TYLENOL) 325 MG tablet Take 650 mg by mouth every 6 (six) hours as needed for moderate pain.   Yes Historical Provider, MD  amLODipine (NORVASC) 10 MG tablet Take 10 mg by mouth at bedtime.    Yes Historical Provider, MD  aspirin EC 81 MG tablet Take 81 mg by mouth at bedtime.    Yes Historical Provider, MD  aspirin-acetaminophen-caffeine (EXCEDRIN MIGRAINE) [phone] MG tablet Take 2 tablets by  mouth every 6 (six) hours as needed for headache.   Yes Historical Provider, MD  bisacodyl (DULCOLAX) 5 MG EC tablet Take 5 mg by mouth daily as needed for moderate constipation.   Yes Historical Provider, MD  bismuth subsalicylate (KAOPECTATE) 262 MG/15ML suspension Take 30 mLs by mouth every 6 (six) hours as needed for indigestion or diarrhea or loose stools.   Yes Historical Provider, MD  calcium acetate (PHOSLO) 667 MG capsule Take 1,334 mg by mouth 3 (three) times daily with meals. Takes 2 caps with meals and 1 cap with snacks   Yes Historical Provider, MD  Chlorpheniramine-APAP (CORICIDIN) 2-325 MG TABS Take 2 tablets by mouth daily as needed (for cold).   Yes Historical Provider, MD  cinacalcet (SENSIPAR) 60 MG tablet Take 60 mg by mouth at bedtime.    Yes Historical Provider, MD  fenofibrate (TRICOR) 145 MG tablet Take 145 mg by mouth every evening.    Yes Historical Provider, MD  fish oil-omega-3 fatty acids 1000 MG capsule Take 1 g by mouth at bedtime.    Yes Historical Provider, MD  insulin aspart (NOVOLOG) 100 UNIT/ML injection Inject 0-9 Units into the skin 3 (three) times daily with meals. 10/18/15  Yes Jeralyn Bennett, MD  labetalol (NORMODYNE) 300 MG tablet Take 450 mg by mouth 2 (two) times daily. To take 450 mg BID   Yes Historical Provider, MD  losartan (COZAAR) 100 MG tablet Take 100 mg by mouth every evening.   Yes Historical Provider, MD  multivitamin (RENA-VIT) TABS tablet Take 1 tablet by mouth every evening.    Yes Historical Provider, MD  Naphazoline HCl (CLEAR EYES OP) Place 1 drop into both eyes as needed (for dry eyes).   Yes Historical Provider, MD  oxyCODONE-acetaminophen (PERCOCET/ROXICET) 5-325 MG tablet Take 1 tablet by mouth every 4 (four) hours as needed for severe pain.   Yes Historical Provider, MD  RaNITidine HCl (ACID REDUCER PO) Take 1 tablet by mouth as needed (for acid reflux).   Yes Historical Provider, MD  Travoprost, BAK Free, (TRAVATAN) 0.004 % SOLN  ophthalmic solution Place 1 drop into both eyes at bedtime.   Yes Historical Provider, MD  hydrALAZINE (APRESOLINE) 25 MG tablet Take 1 tablet (25 mg total) by mouth every 6 (six) hours. Patient not taking: Reported on 04/26/2016 04/15/16   Meredeth Ide, MD    Family History Family History  Problem Relation Age of Onset  . Diabetes Mother   . Heart disease Mother   . COPD Father   . Hypertension Father   . Lung disease Father   . Diabetes Father   . Diabetes Sister   . Breast cancer Sister     Social History Social History  Substance Use Topics  . Smoking status: Former Smoker  Types: Cigarettes    Quit date: 08/04/1999  . Smokeless tobacco: Never Used  . Alcohol use No     Allergies   Lactose intolerance (gi)   Review of Systems Review of Systems  Cardiovascular: Positive for chest pain.  All other systems reviewed and are negative.    Physical Exam Updated Vital Signs BP (!) 215/79   Pulse 72   Temp 98.3 F (36.8 C) (Oral)   Resp 16   Ht 5\' 6"  (1.676 m)   Wt 163 lb 12.8 oz (74.3 kg) Comment: pt weighed at end of dialysis yesterday  SpO2 99%   BMI 26.44 kg/m   Physical Exam  Constitutional: She is oriented to person, place, and time. She appears well-developed and well-nourished.  HENT:  Head: Normocephalic and atraumatic.  Cardiovascular: Normal rate and regular rhythm.   No murmur heard. Pulmonary/Chest: Effort normal and breath sounds normal. No respiratory distress. She exhibits no tenderness.  Vascular catheter in the right anterior chest wall with dressing clean, dry, intact.  Abdominal: Soft. There is no tenderness. There is no rebound and no guarding.  Musculoskeletal: She exhibits no edema or tenderness.  Neurological: She is alert and oriented to person, place, and time.  Right facial droop  Skin: Skin is warm and dry.  Psychiatric: She has a normal mood and affect. Her behavior is normal.  Nursing note and vitals reviewed.    ED  Treatments / Results  Labs (all labs ordered are listed, but only abnormal results are displayed) Labs Reviewed  COMPREHENSIVE METABOLIC PANEL - Abnormal; Notable for the following:       Result Value   Chloride 95 (*)    Glucose, Bld 297 (*)    BUN 49 (*)    Creatinine, Ser 4.83 (*)    Calcium 8.1 (*)    GFR calc non Af Amer 8 (*)    GFR calc Af Amer 10 (*)    All other components within normal limits  CBC WITH DIFFERENTIAL/PLATELET - Abnormal; Notable for the following:    RBC 3.84 (*)    Hemoglobin 10.3 (*)    HCT 33.3 (*)    RDW 17.6 (*)    All other components within normal limits  BASIC METABOLIC PANEL - Abnormal; Notable for the following:    Chloride 97 (*)    Glucose, Bld 195 (*)    BUN 52 (*)    Creatinine, Ser 5.05 (*)    Calcium 8.2 (*)    GFR calc non Af Amer 8 (*)    GFR calc Af Amer 9 (*)    All other components within normal limits  CBC - Abnormal; Notable for the following:    RBC 3.83 (*)    Hemoglobin 10.1 (*)    HCT 33.2 (*)    RDW 17.7 (*)    All other components within normal limits  TROPONIN I - Abnormal; Notable for the following:    Troponin I 0.22 (*)    All other components within normal limits  LIPASE, BLOOD  TROPONIN I  TROPONIN I  I-STAT TROPOININ, ED    EKG  EKG Interpretation  Date/Time:  Sunday July 07 2016 11:14:37 EDT Ventricular Rate:  78 PR Interval:    QRS Duration: 90 QT Interval:  421 QTC Calculation: 480 R Axis:   -60 Text Interpretation:  Sinus arrhythmia Inferior infarct, old Anterior infarct, old Confirmed by Lincoln Brighamees, Liz [phone]([id number]) on 07/07/2016 11:20:01 AM       Radiology Dg  Chest 2 View  Result Date: 07/07/2016 CLINICAL DATA:  Awoke this morning with shortness of breath and chest pain, improvement in symptoms with nitroglycerin, hypertension, end-stage renal disease on dialysis, hyperlipidemia, type II diabetes mellitus EXAM: CHEST  2 VIEW COMPARISON:  06/12/2016 FINDINGS: RIGHT jugular central venous catheter  with tip projecting over cavoatrial junction. Enlargement of cardiac silhouette with pulmonary vascular congestion. Atherosclerotic calcification aorta. Bibasilar atelectasis. No gross infiltrate, pleural effusion or pneumothorax. Bones demineralized. IMPRESSION: Bibasilar atelectasis. Enlargement of cardiac silhouette with pulmonary vascular congestion. Electronically Signed   By: Ulyses Southward M.D.   On: 07/07/2016 11:48    Procedures Procedures (including critical care time)  Medications Ordered in ED Medications  acetaminophen (TYLENOL) tablet 650 mg (not administered)  latanoprost (XALATAN) 0.005 % ophthalmic solution 1 drop (not administered)  bisacodyl (DULCOLAX) EC tablet 5 mg (not administered)  bismuth subsalicylate (PEPTO BISMOL) 262 MG/15ML suspension 30 mL (not administered)  losartan (COZAAR) tablet 100 mg (not administered)  cinacalcet (SENSIPAR) tablet 60 mg (not administered)  calcium acetate (PHOSLO) capsule 1,334 mg (not administered)  multivitamin (RENA-VIT) tablet 1 tablet (not administered)  fish oil-omega-3 fatty acids capsule 1 g (not administered)  amLODipine (NORVASC) tablet 10 mg (10 mg Oral Given 07/07/16 1704)  aspirin EC tablet 81 mg (not administered)  fenofibrate tablet 160 mg (not administered)  heparin injection 5,000 Units (not administered)  acetaminophen (TYLENOL) tablet 650 mg (not administered)    Or  acetaminophen (TYLENOL) suppository 650 mg (not administered)  insulin aspart (novoLOG) injection 0-9 Units (not administered)  insulin aspart (novoLOG) injection 3 Units (not administered)  pantoprazole (PROTONIX) EC tablet 40 mg (not administered)  oxyCODONE-acetaminophen (PERCOCET/ROXICET) 5-325 MG per tablet 1 tablet (1 tablet Oral Given 07/07/16 1619)     Initial Impression / Assessment and Plan / ED Course  I have reviewed the triage vital signs and the nursing notes.  Pertinent labs & imaging results that were available during my care of the  patient were reviewed by me and considered in my medical decision making (see chart for details).  Clinical Course    Patient with history of ESRD here with chest pain, HEART score 6.  She is pain-free in the emergency department. Plan to admit for observation for chest pain. Presentation is not consistent with pneumonia, PE, hypertensive urgency.  Final Clinical Impressions(s) / ED Diagnoses   Final diagnoses:  Chest pain, unspecified chest pain type    New Prescriptions New Prescriptions   No medications on file     Tilden Fossa, MD 07/08/16 [phone]

## 2016-07-07 NOTE — H&P (Signed)
ADMISSION HISTORY & PHYSICAL  Chief Complaint:  Chest pain  HPI:  This is a 70 y.o. female with a past medical history significant for ESRD on HD, DM2, HTN who presents with chest pain. The pain is characterized as sharp substernal 10/10 pain that occurred after she had eaten a biscuit with honey. She contacted EMS at that time, and she measured her blood glucose which was 135 and her BP was 143/68. She then took 324 mg aspirin, which did not relieve the pain. Once EMS arrived, the patient was administered nitroglycerine, which brought the pain down to a 5/10 in severity with a more dull quality. The patient denies changes in severity of the pain with positioning. She denies any radiation, nausea/vomiting, diaphoresis, changes in vision, diarrhea, constipation, hematochezia, melena, and abdominal pain. She reports having SOB at rest but is able to sleep lying flat on her back. She has been having intermittent chest pain with SOB since she received her right internal jugular vein HD port, which has been worse over the past 2 weeks. She reports taking 2600 mg x1 on most days to relieve long-standing joint pain which is worst in her R knee, but also has pain in her L knee, L shoulder, and in her hands. She has also been taking Excedrin PRN for R sided headaches for the past 2 weeks, almost on a daily basis. Her HD schedule is Tu/Th/Sa, with last session yesterday, which was accessed through a R jugular central venous port.  PMHx:  Past Medical History:  Diagnosis Date  . Anemia    low iron  . Arthritis   . Chronic kidney disease    Dialysis T/Th/Sa  . Diabetes mellitus    type 2  . Diabetic retinopathy ([place])   . Dialysis patient [facility])    M-W-F @ [facility]  . Ejection fraction   . GERD (gastroesophageal reflux disease)   . Glaucoma   . H/O: Bell's palsy 2007  . Hyperlipidemia   . Hypertension   . Macular degeneration   . Shortness of breath dyspnea    with walking    Past Surgical  History:  Procedure Laterality Date  . ABDOMINAL HYSTERECTOMY    . ARTERIOVENOUS GRAFT PLACEMENT     Left forearm x 2, one removed in March  . ARTERIOVENOUS GRAFT PLACEMENT     Left forearm  . [place] REMOVAL  10/08/2011   Procedure: REMOVAL OF ARTERIOVENOUS GORETEX GRAFT ([place]);  Surgeon: Elam Dutch, MD;  Location: [place];  Service: Vascular;  Laterality: Left;  . BTL    . COLONOSCOPY    . EXCHANGE OF A DIALYSIS CATHETER Right 03/27/2016   Procedure: EXCHANGE OF A DIALYSIS CATHETER;  Surgeon: Conrad Elgin, MD;  Location: [place];  Service: Vascular;  Laterality: Right;  . EYE SURGERY Left    Lasik surgery on left. Cataract surgery on both eyes  . LAPAROTOMY  ?2000   Pelvic mass (Benign)  . REVISION OF ARTERIOVENOUS GORETEX GRAFT Left 03/27/2016   Procedure: REVISION OF LEFT ARTERIOVENOUS GORETEX GRAFT;  Surgeon: Conrad Conyers, MD;  Location: [place];  Service: Vascular;  Laterality: Left;  . TONSILLECTOMY      FAMHx:  Family History  Problem Relation Age of Onset  . Diabetes Mother   . Heart disease Mother   . COPD Father   . Hypertension Father   . Lung disease Father   . Diabetes Father   . Diabetes Sister   . Breast cancer Sister  Patient also has a daughter with SLE.  SOCHx:   reports that she quit smoking about 16 years ago. Her smoking use included Cigarettes. She has never used smokeless tobacco. She reports that she does not drink alcohol or use drugs.  ALLERGIES:  Allergies  Allergen Reactions  . Lactose Intolerance (Gi) Other (See Comments)    Abdominal pains and bloating    ROS: Pertinent items noted in HPI and remainder of comprehensive ROS otherwise negative.  HOME MEDS: No current facility-administered medications on file prior to encounter.    Current Outpatient Prescriptions on File Prior to Encounter  Medication Sig Dispense Refill  . acetaminophen (TYLENOL) 325 MG tablet Take 650 mg by mouth every 6 (six) hours as needed for moderate pain.    Marland Kitchen  amLODipine (NORVASC) 10 MG tablet Take 10 mg by mouth at bedtime.     Marland Kitchen aspirin EC 81 MG tablet Take 81 mg by mouth at bedtime.     . bisacodyl (DULCOLAX) 5 MG EC tablet Take 5 mg by mouth daily as needed for moderate constipation.    . bismuth subsalicylate (KAOPECTATE) 262 MG/15ML suspension Take 30 mLs by mouth every 6 (six) hours as needed for indigestion or diarrhea or loose stools.    . calcium acetate (PHOSLO) 667 MG capsule Take 1,334 mg by mouth 3 (three) times daily with meals. Takes 2 caps with meals and 1 cap with snacks    . Chlorpheniramine-APAP (CORICIDIN) 2-325 MG TABS Take 2 tablets by mouth daily as needed (for cold).    . cinacalcet (SENSIPAR) 60 MG tablet Take 60 mg by mouth at bedtime.     . fenofibrate (TRICOR) 145 MG tablet Take 145 mg by mouth every evening.     . fish oil-omega-3 fatty acids 1000 MG capsule Take 1 g by mouth at bedtime.     . insulin aspart (NOVOLOG) 100 UNIT/ML injection Inject 0-9 Units into the skin 3 (three) times daily with meals. 10 mL 11  . labetalol (NORMODYNE) 300 MG tablet Take 450 mg by mouth 2 (two) times daily. To take 450 mg BID    . losartan (COZAAR) 100 MG tablet Take 100 mg by mouth every evening.    . multivitamin (RENA-VIT) TABS tablet Take 1 tablet by mouth every evening.     Marland Kitchen oxyCODONE-acetaminophen (PERCOCET/ROXICET) 5-325 MG tablet Take 1 tablet by mouth every 4 (four) hours as needed for severe pain.    . hydrALAZINE (APRESOLINE) 25 MG tablet Take 1 tablet (25 mg total) by mouth every 6 (six) hours. (Patient not taking: Reported on 04/26/2016) 120 tablet 2    LABS/IMAGING: Results for orders placed or performed during the hospital encounter of 07/07/16 (from the past 48 hour(s))  I-stat troponin, ED     Status: None   Collection Time: 07/07/16 12:12 PM  Result Value Ref Range   Troponin i, poc 0.07 0.00 - 0.08 ng/mL   Comment 3            Comment: Due to the release kinetics of cTnI, a negative result within the first hours of  the onset of symptoms does not rule out myocardial infarction with certainty. If myocardial infarction is still suspected, repeat the test at appropriate intervals.   Comprehensive metabolic panel     Status: Abnormal   Collection Time: 07/07/16 12:20 PM  Result Value Ref Range   Sodium 136 135 - 145 mmol/L   Potassium 4.0 3.5 - 5.1 mmol/L   Chloride 95 (L)  101 - 111 mmol/L   CO2 31 22 - 32 mmol/L   Glucose, Bld 297 (H) 65 - 99 mg/dL   BUN 49 (H) 6 - 20 mg/dL   Creatinine, Ser 4.83 (H) 0.44 - 1.00 mg/dL   Calcium 8.1 (L) 8.9 - 10.3 mg/dL   Total Protein 6.5 6.5 - 8.1 g/dL   Albumin 3.5 3.5 - 5.0 g/dL   AST 27 15 - 41 U/L   ALT 20 14 - 54 U/L   Alkaline Phosphatase 60 38 - 126 U/L   Total Bilirubin 0.4 0.3 - 1.2 mg/dL   GFR calc non Af Amer 8 (L) >60 mL/min   GFR calc Af Amer 10 (L) >60 mL/min    Comment: (NOTE) The eGFR has been calculated using the CKD EPI equation. This calculation has not been validated in all clinical situations. eGFR's persistently <60 mL/min signify possible Chronic Kidney Disease.    Anion gap 10 5 - 15  CBC with Differential     Status: Abnormal   Collection Time: 07/07/16 12:20 PM  Result Value Ref Range   WBC 7.0 4.0 - 10.5 K/uL   RBC 3.84 (L) 3.87 - 5.11 MIL/uL   Hemoglobin 10.3 (L) 12.0 - 15.0 g/dL   HCT 33.3 (L) 36.0 - 46.0 %   MCV 86.7 78.0 - 100.0 fL   MCH 26.8 26.0 - 34.0 pg   MCHC 30.9 30.0 - 36.0 g/dL   RDW 17.6 (H) 11.5 - 15.5 %   Platelets 313 150 - 400 K/uL   Neutrophils Relative % 69 %   Neutro Abs 4.9 1.7 - 7.7 K/uL   Lymphocytes Relative 25 %   Lymphs Abs 1.8 0.7 - 4.0 K/uL   Monocytes Relative 5 %   Monocytes Absolute 0.4 0.1 - 1.0 K/uL   Eosinophils Relative 1 %   Eosinophils Absolute 0.1 0.0 - 0.7 K/uL   Basophils Relative 0 %   Basophils Absolute 0.0 0.0 - 0.1 K/uL  Lipase, blood     Status: None   Collection Time: 07/07/16 12:20 PM  Result Value Ref Range   Lipase 48 11 - 51 U/L  Basic metabolic panel     Status:  Abnormal   Collection Time: 07/07/16  2:28 PM  Result Value Ref Range   Sodium 136 135 - 145 mmol/L   Potassium 4.2 3.5 - 5.1 mmol/L   Chloride 97 (L) 101 - 111 mmol/L   CO2 30 22 - 32 mmol/L   Glucose, Bld 195 (H) 65 - 99 mg/dL   BUN 52 (H) 6 - 20 mg/dL   Creatinine, Ser 5.05 (H) 0.44 - 1.00 mg/dL   Calcium 8.2 (L) 8.9 - 10.3 mg/dL   GFR calc non Af Amer 8 (L) >60 mL/min   GFR calc Af Amer 9 (L) >60 mL/min    Comment: (NOTE) The eGFR has been calculated using the CKD EPI equation. This calculation has not been validated in all clinical situations. eGFR's persistently <60 mL/min signify possible Chronic Kidney Disease.    Anion gap 9 5 - 15  CBC     Status: Abnormal   Collection Time: 07/07/16  2:28 PM  Result Value Ref Range   WBC 7.3 4.0 - 10.5 K/uL   RBC 3.83 (L) 3.87 - 5.11 MIL/uL   Hemoglobin 10.1 (L) 12.0 - 15.0 g/dL   HCT 33.2 (L) 36.0 - 46.0 %   MCV 86.7 78.0 - 100.0 fL   MCH 26.4 26.0 - 34.0 pg  MCHC 30.4 30.0 - 36.0 g/dL   RDW 17.7 (H) 11.5 - 15.5 %   Platelets 319 150 - 400 K/uL  Troponin I     Status: Abnormal   Collection Time: 07/07/16  2:28 PM  Result Value Ref Range   Troponin I 0.22 (HH) <0.03 ng/mL    Comment: CRITICAL RESULT CALLED TO, READ BACK BY AND VERIFIED WITH: RN [facility] AT 1528 [id number] MARTINB     Dg Chest 2 View Result Date: 07/07/2016 IMPRESSION: Bibasilar atelectasis. Enlargement of cardiac silhouette with pulmonary vascular congestion. Electronically Signed   By: Lavonia Dana M.D.   On: 07/07/2016 11:48   Vitals:   07/07/16 1123  BP: 154/59  Resp: 18  Temp: 98.3 F (36.8 C)    EXAM: General appearance: alert, cooperative, mild distress and sitting up in bed Lungs: clear to auscultation bilaterally Heart: regular rate and rhythm, S1, S2 normal, no murmur, click, rub or gallop Abdomen: soft, non-tender; bowel sounds normal; no masses,  no organomegaly Extremities: extremities normal, atraumatic, no cyanosis or edema Skin: Skin  color, texture, turgor normal. No rashes or lesions Neurologic: Grossly normal  ASSESSMENT/PLAN: Ms. Arntz is a 70 y.o. female with a past medical history significant for ESRD on HD, DM2, HTN who presents with sharp non-radiating substernal chest pain.   Patient Active Problem List   Diagnosis Date Noted  . Chest pain 07/07/2016  . Dyspnea on exertion 04/14/2016  . SOB (shortness of breath) 04/14/2016  . ESRD on dialysis ([place]) 04/14/2016  . Benign essential HTN 04/14/2016  . Hyperlipidemia 04/14/2016  . Type 2 diabetes mellitus with renal complication ([place]) 88/09/314   Chest pain: Patient EKG did not demonstrate STEMI, initial tropinin elevated at 0.22. CXR showed enlargement of cardiac silhouette with pulmonary vascular congestion, with no other signs of cardiopulmonary processes. 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 Patient is afebrile and hemodynamically stable. H/H is 10.1/33.2, which are at her baseline. There are no symptoms of GI bleed, and lipase was 48. She had previously taken Nexium for GERD, but is no longer taking it. BMP is notable for Cr of 4.83 and Ca 8.1, patient with normal potassium of 4.2. - First troponin 0.22. Continue trending tropinins x2 - Consider cardiac stress test - Start Protonix 40 mg daily  - Start Aspirin 81 mg daily  ESRD: Has HD Tu/Th/Sa, last session yesterday. - Continue dialysis Tu/Th/Sa.  Hypertension: Patient takes amlodipine 10 mg, losartan 100 mg, and labetalol 450 mg bid at home. BP 154/59 on presentation. - Continue home amlodipine 10 mg daily - Continue home losartan 100 mg qd  T2DM: glucose was 297 on presentation. Patient uses insulin aspart at home 0-9 units 3 times daily with meals - Sliding scale insulin - Modified carbohydrate diet - obtain HbA1c  Joint pain: patient reports long-standing joint pain in hands, R knee > L knee, and left shoulder.  Denies having rheumatological workup. - Acetaminophen 650 mg PRN  DVT/VTE prophylaxis: - heparin 5,000 U q8h   Signed: [place] [facility]

## 2016-07-07 NOTE — ED Notes (Signed)
Dr. Ahmed at bedside.

## 2016-07-07 NOTE — ED Notes (Addendum)
Page sent to Dr. Oswaldo DoneVincent.

## 2016-07-07 NOTE — ED Provider Notes (Signed)
Asked to assist with IV placement for EJ  Angiocath insertion Performed by: Vida RollerBrian D Camielle Sizer  Consent: Verbal consent obtained. Risks and benefits: risks, benefits and alternatives were discussed Time out: Immediately prior to procedure a "time out" was called to verify the correct patient, procedure, equipment, support staff and site/side marked as required.  Preparation: Patient was prepped and draped in the usual sterile fashion.  Vein Location: L EJ  Not Ultrasound Guided  Gauge: 20  Normal blood return and flush without difficulty Patient tolerance: Patient tolerated the procedure well with no immediate complications.      Eber HongBrian Kempton Milne, MD 07/07/16 [phone]

## 2016-07-07 NOTE — ED Notes (Signed)
IV team at bedside 

## 2016-07-07 NOTE — ED Notes (Signed)
[facility] but since pt is still having active chest pain she will need stepdown bed.  Called bed control and advised.

## 2016-07-07 NOTE — ED Notes (Signed)
Attempted IV X2, unsuccessful  

## 2016-07-07 NOTE — ED Triage Notes (Signed)
To room via EMS.  Onset 1 hour prior to patient calling 911 chest pain, mid chest, initially rated 10/10 sharp pain.  Pt took ASA 324mg .  EMS gave NTG x 1, decreased pain to 5/10.  Now pain is 2-3/10 pain scale, dull ache.  Pt initially thought it was reflux.  Pt did not take any tums, alka seltzer, rolaids or baking soda today.  Pt used to be on Nexium but her lawyer told her to stop taking.  Pt has other c/o:  Sharp pain RLQ, 1-2/10 pain scale; pain at dialysis port, 1-2/10 pain scale, (pt reports there was a new tech yesterday that accessed her port at dialysis); epigastric pain; shortness of breath on exertion (chronic, pt trying to get on oxygen- PCP denied).

## 2016-07-07 NOTE — H&P (Signed)
Date: 07/07/2016               Patient Name:  Colleen Mullins MRN: [id number]  DOB: 1946/02/07 Age / Sex: 70 y.o., female   PCP: Zoila Shutter, MD         Medical Service: Internal Medicine Teaching Service         Attending Physician: Dr. Tyson Alias, MD    First Contact: Will Schreiner Pager: [phone]  Second Contact: Dr. Thomasene Lot, MD Pager: [phone]       After Hours (After 5p/  First Contact Pager: [phone]  weekends / holidays): Second Contact Pager: [phone]   Chief Complaint: Chest pain  History of Present Illness: Colleen Mullins is a 70 year old female with a past medical history of end-stage renal disease on hemodialysis Tuesday/Thursday/Saturday, hypertension, gastroesophageal reflux disease and diabetes mellitus who presents to the emergency department with a chief complaint of substernal chest pain. The patient said the pain started this morning when she was eating a biscuit. She states the pain was 10/10 and was a sharp-stabbing in sensation. This pain is located substernal and does not radiate to the patient's arm, neck, back or abdomen. She denies nausea, vomiting, abdominal pain or diaphoresis. She does endorse shortness of breath but says her shortness of breath is not worse than her baseline levels. She has a history of gastroesophageal reflux disease and states this pain was more severe than her reflux in the past. She has never had a myocardial infarction. She took 324 mg of aspirin without relief. At the time of the pain the patient took her blood pressure and noted to be 143/83 and checked her blood sugar and said she was normoglycemic. After taking the aspirin  the pain persisted so the patient called 911.  When EMS arrived the patient was hypertensive and she was given nitroglycerin 1. The patient says this  Improved her pain from a 10/10 to a 5/5. She was then brought to the [facility] emergency department for further management.  Upon arrival to  emergency department the patient was hemodynamically stable and afebrile. She still endorsed chest pain but had improved to 3/10. Her blood pressure in the emergency department was 154/59. An EKG did not show evidence of STEMI and her first troponin was 0.22. Basic metabolic panel was significant for creatinine of 4.83, glucose of 297 and calcium of 8.1. By the time I begin interviewing the patient she said her chest pain had entirely resolved. At that time she denied changes in vision, increased shortness of breath, nausea, vomiting or abdominal pain. She also denies hematemesis, melena or blood in her stools.  Meds:  Current Meds  Medication Sig  . acetaminophen (TYLENOL) 325 MG tablet Take 650 mg by mouth every 6 (six) hours as needed for moderate pain.  Marland Kitchen amLODipine (NORVASC) 10 MG tablet Take 10 mg by mouth at bedtime.   Marland Kitchen aspirin EC 81 MG tablet Take 81 mg by mouth at bedtime.   Marland Kitchen aspirin-acetaminophen-caffeine (EXCEDRIN MIGRAINE) 250-250-65 MG tablet Take 2 tablets by mouth every 6 (six) hours as needed for headache.  . bisacodyl (DULCOLAX) 5 MG EC tablet Take 5 mg by mouth daily as needed for moderate constipation.  . bismuth subsalicylate (KAOPECTATE) 262 MG/15ML suspension Take 30 mLs by mouth every 6 (six) hours as needed for indigestion or diarrhea or loose stools.  . calcium acetate (PHOSLO) 667 MG capsule Take 1,334 mg by mouth 3 (three) times daily with meals. Takes  2 caps with meals and 1 cap with snacks  . Chlorpheniramine-APAP (CORICIDIN) 2-325 MG TABS Take 2 tablets by mouth daily as needed (for cold).  . cinacalcet (SENSIPAR) 60 MG tablet Take 60 mg by mouth at bedtime.   . fenofibrate (TRICOR) 145 MG tablet Take 145 mg by mouth every evening.   . fish oil-omega-3 fatty acids 1000 MG capsule Take 1 g by mouth at bedtime.   . insulin aspart (NOVOLOG) 100 UNIT/ML injection Inject 0-9 Units into the skin 3 (three) times daily with meals.  Marland Kitchen labetalol (NORMODYNE) 300 MG tablet Take  450 mg by mouth 2 (two) times daily. To take 450 mg BID  . losartan (COZAAR) 100 MG tablet Take 100 mg by mouth every evening.  . multivitamin (RENA-VIT) TABS tablet Take 1 tablet by mouth every evening.   . Naphazoline HCl (CLEAR EYES OP) Place 1 drop into both eyes as needed (for dry eyes).  Marland Kitchen oxyCODONE-acetaminophen (PERCOCET/ROXICET) 5-325 MG tablet Take 1 tablet by mouth every 4 (four) hours as needed for severe pain.  . RaNITidine HCl (ACID REDUCER PO) Take 1 tablet by mouth as needed (for acid reflux).  . Travoprost, BAK Free, (TRAVATAN) 0.004 % SOLN ophthalmic solution Place 1 drop into both eyes at bedtime.     Allergies: No known drug allergies, patient is lactose intolerant Allergies as of 07/07/2016 - Review Complete 07/07/2016  Allergen Reaction Noted  . Lactose intolerance (gi) Other (See Comments) 08/29/2015   Past Medical History:  Diagnosis Date  . Anemia    low iron  . Arthritis   . Chronic kidney disease    Dialysis T/Th/Sa  . Diabetes mellitus    type 2  . Diabetic retinopathy (HCC)   . Dialysis patient [facility])    M-W-F @ [facility]  . Ejection fraction   . GERD (gastroesophageal reflux disease)   . Glaucoma   . H/O: Bell's palsy 2007  . Hyperlipidemia   . Hypertension   . Macular degeneration   . Shortness of breath dyspnea    with walking    Family History: Hypertension, diabetes and breast cancer in her mother and sister  Social History: Denies tobacco, alcohol or illicit drug use  Review of Systems: A complete ROS was negative except as per HPI.   Physical Exam: Blood pressure 154/59, temperature 98.3 F (36.8 C), temperature source Oral, resp. rate 18, height  (1.676 m), weight 163 lb 12.8 oz (74.3 kg), SpO2 99 %. Physical Exam  Constitutional: She is oriented to person, place, and time. She appears well-developed and well-nourished.  In no acute distress  HENT:  Head: Normocephalic and atraumatic.  Cardiovascular: Normal rate and  regular rhythm.  Exam reveals no gallop and no friction rub.   No murmur heard. Respiratory: Effort normal and breath sounds normal. No respiratory distress. She has no wheezes. She has no rales.  GI: Soft. Bowel sounds are normal. She exhibits no distension. There is no tenderness.  Musculoskeletal: She exhibits no edema.  Bony nodule over the patient's right knee with mild surrounding edema.  Neurological: She is alert and oriented to person, place, and time.  Skin: Skin is warm and dry.     EKG: No STEMI  CXR: Enlarged cardiac silhouette with pulmonary vascular congestion  Assessment & Plan by Problem: Active Problems:   Type 2 diabetes mellitus with renal complication (HCC)   ESRD on dialysis (HCC)   Benign essential HTN   Hyperlipidemia   Chest pain  1.  Chest Pain -- Patient presents with substernal chest pain relieved with nitroglycerin. Patient hemodynamically stable with EKG not demonstrating STEMI and troponin of 0.22. Echocardiogram on 10/18/2015 with normal ejection fraction of 60-65%. Echocardiogram also demonstrating potential nonobstructive mid/apical HOCM.  Patient with multiple cardiac risk factors including hypertension, hyperlipidemia, type 2 diabetes and end-stage renal disease. Differential diagnosis includes NSTEMI, demand ischemia 2/2 hypertensive emergency, gastroesophageal reflux disease, MSK pain, peptic ulcer disease or other cardiac pathology. -- EKG without evidence of acute infarct -- First troponin 0.22 (Troponin of 0.25 on 04/16/2016) , will trend troponins 3 -- BMP- potassium normal, significant for creatinine 4.83 and calcium of 8.1 --  Consider ischemic rule out with cardiac stress test   -- Acetaminophen 650 mg as needed for pain -- Aspirin 81 mg daily --Start Protonix 40 mg once daily for gastroesophageal reflux disease  2. Hypertension -- Patient with history of hypertension. Takes amlodipine 10 mg, losartan 100 mg and labetalol 450 mg 2 times  daily. Blood pressure at time of presentation 143/83. -- Continue amlodipine 10 mg once daily and losartan 100 mg once daily -- Hold labetalol at this time -- Basic metabolic panel tomorrow morning  3. ESRD -- Patient with end-stage renal disease on hemodialysis T, T, S. Patient had full dialysis session yesterday morning. Patient appears euvolemic on physical examination. -- Continue current dialysis regimen  4. Diabetes Mellitus Type II -- Patient with history of diabetes. Glucose at time of presentation to 297. Patient uses insulin aspart at home 0-9 units 3 times daily with meals -- Sliding scale insulin -- Modified carbohydrate diet -- Hemoglobin A1c  5. DVT/PE prophylaxis -- Heparin injection 5000 units every 8 hours  Dispo: Admit patient to Observation with expected length of stay less than 2 midnights.  Signed: Thomasene LotJames Milyn Stapleton, MD 07/07/2016, 2:25 PM  Pager: [phone]

## 2016-07-08 ENCOUNTER — Encounter (HOSPITAL_COMMUNITY): Payer: Self-pay | Admitting: Physician Assistant

## 2016-07-08 DIAGNOSIS — R079 Chest pain, unspecified: Secondary | ICD-10-CM

## 2016-07-08 DIAGNOSIS — I2510 Atherosclerotic heart disease of native coronary artery without angina pectoris: Secondary | ICD-10-CM | POA: Diagnosis not present

## 2016-07-08 DIAGNOSIS — D631 Anemia in chronic kidney disease: Secondary | ICD-10-CM

## 2016-07-08 DIAGNOSIS — R0789 Other chest pain: Secondary | ICD-10-CM | POA: Diagnosis not present

## 2016-07-08 DIAGNOSIS — N189 Chronic kidney disease, unspecified: Secondary | ICD-10-CM

## 2016-07-08 DIAGNOSIS — N186 End stage renal disease: Secondary | ICD-10-CM | POA: Diagnosis not present

## 2016-07-08 DIAGNOSIS — R7989 Other specified abnormal findings of blood chemistry: Secondary | ICD-10-CM

## 2016-07-08 DIAGNOSIS — I10 Essential (primary) hypertension: Secondary | ICD-10-CM

## 2016-07-08 DIAGNOSIS — E1122 Type 2 diabetes mellitus with diabetic chronic kidney disease: Secondary | ICD-10-CM | POA: Diagnosis not present

## 2016-07-08 HISTORY — DX: Chronic kidney disease, unspecified: N18.9

## 2016-07-08 HISTORY — DX: Chronic kidney disease, unspecified: D63.1

## 2016-07-08 LAB — TROPONIN I: Troponin I: 0.23 ng/mL (ref <0.03)

## 2016-07-08 LAB — GLUCOSE, CAPILLARY
GLUCOSE-CAPILLARY: 132 mg/dL — AB (ref 65–99)
Glucose-Capillary: 137 mg/dL — ABNORMAL HIGH (ref 65–99)
Glucose-Capillary: 297 mg/dL — ABNORMAL HIGH (ref 65–99)
Glucose-Capillary: 231 mg/dL — ABNORMAL HIGH (ref 65–99)

## 2016-07-08 MED ORDER — CALCIUM ACETATE (PHOS BINDER) 667 MG PO CAPS
667.0000 mg | ORAL_CAPSULE | Freq: Three times a day (TID) | ORAL | Status: DC
  Administered 2016-07-08 – 2016-07-11 (×5): 667 mg via ORAL
  Filled 2016-07-08 (×5): qty 1

## 2016-07-08 MED ORDER — LABETALOL HCL 100 MG PO TABS
100.0000 mg | ORAL_TABLET | Freq: Two times a day (BID) | ORAL | Status: DC
  Administered 2016-07-08 – 2016-07-10 (×5): 100 mg via ORAL
  Filled 2016-07-08 (×5): qty 1

## 2016-07-08 MED ORDER — DIPHENHYDRAMINE HCL 25 MG PO CAPS
25.0000 mg | ORAL_CAPSULE | Freq: Once | ORAL | Status: AC
  Administered 2016-07-08: 25 mg via ORAL
  Filled 2016-07-08: qty 1

## 2016-07-08 MED ORDER — DIPHENHYDRAMINE HCL 25 MG PO CAPS
25.0000 mg | ORAL_CAPSULE | Freq: Four times a day (QID) | ORAL | Status: DC | PRN
Start: 2016-07-08 — End: 2016-07-11
  Administered 2016-07-08 – 2016-07-11 (×9): 25 mg via ORAL
  Filled 2016-07-08 (×8): qty 1

## 2016-07-08 NOTE — Progress Notes (Signed)
Subjective: No acute events overnight. Patient complains of chest pain this morning. She denies new shortness of breath, nausea, vomiting or abdominal pain. She denies changes in vision or headaches. Her chest pain is most severe upon extremely light palpation of the sternum. She has no additional acute complaints or concerns this morning.  Objective:  Vital signs in last 24 hours: Vitals:   07/07/16 1949 07/08/16 0027 07/08/16 0500 07/08/16 0735  BP: (!) 148/54 (!) 167/66 (!) 167/75 (!) 181/76  Pulse: 66 78 66 72  Resp: (!) 22 18 18 14   Temp: 98.2 F (36.8 C) 98.6 F (37 C) 98.4 F (36.9 C) 98.4 F (36.9 C)  TempSrc: Oral Oral Oral Oral  SpO2: 100% 99% 100% 100%  Weight:   167 lb 6.4 oz (75.9 kg)   Height:       Physical Exam  Constitutional: She is oriented to person, place, and time. She appears well-developed and well-nourished.  Complaining of pain but in no acute distress  HENT:  Head: Normocephalic and atraumatic.  Cardiovascular: Normal rate and regular rhythm.  Exam reveals no gallop and no friction rub.   No murmur heard. Respiratory: Effort normal and breath sounds normal. No respiratory distress. She has no wheezes.  GI: Soft. Bowel sounds are normal. She exhibits no distension. There is no tenderness.  No abdominal bruits auscultated  Musculoskeletal: She exhibits no edema.  Patient complaining of severe pain upon extremely light palpation of the sternum. Patient even seems to be in pain when auscultating for heart sounds.  Neurological: She is alert and oriented to person, place, and time.  Skin: Skin is dry.     Assessment/Plan:  Mrs. Colleen Mullins is a 70 year old female with history of hypertension, diabetes, gastroesophageal reflux disease and end-stage renal disease on dialysis T, T, S who presented with a one-day history of substernal chest pain relieved with nitroglycerin, EKG without acute ST segment changes and a positive troponin.  1. Chest Pain --  Patient presents with substernal chest pain relieved with nitroglycerin. Patient hemodynamically stable with EKG not demonstrating STEMI. Echocardiogram on 10/18/2015 with normal ejection fraction of 60-65%.Patient with multiple cardiac risk factors including hypertension, hyperlipidemia, type 2 diabetes and end-stage renal disease. Differential diagnosis includes NSTEMI, demand ischemia 2/2 hypertensive emergency, gastroesophageal reflux disease, MSK pain, peptic ulcer disease or other cardiac pathology. -- EKG without evidence of acute infarct -- Most recent troponin of 0.23 -- Cardiology consulted and will see patient for ischemic workup   -- Acetaminophen 650 mg as needed for pain -- Aspirin 81 mg daily --Start Protonix 40 mg once daily for gastroesophageal reflux disease  2. Hypertension -- Patient with history of hypertension. Takes amlodipine 10 mg, losartan 100 mg and labetalol 450 mg 2 times daily. Blood pressure at time of presentation 143/83. -- Continue amlodipine 10 mg once daily and losartan 100 mg once daily -- Start Labetalol 100 mg BID and titrate up to home dose as tolerated  3. ESRD -- Patient with end-stage renal disease on hemodialysis T, T, S. Patient had full dialysis session yesterday morning. Patient appears euvolemic on physical examination. -- Continue current dialysis regimen  4. Diabetes Mellitus Type II -- Patient with history of diabetes. Glucose at time of presentation to 297. Patient uses insulin aspart at home 0-9 units 3 times daily with meals -- Sliding scale insulin -- Modified carbohydrate diet -- Hemoglobin A1c  5. DVT/PE prophylaxis -- Heparin injection 5000 units every 8 hours   Dispo: Anticipated discharge in  approximately 1-2 day(s).   Thomasene LotJames Laurina Fischl, MD 07/08/2016, 9:58 AM Pager: [phone]
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
## 2016-07-08 NOTE — Progress Notes (Signed)
SUBJECTIVE: patient complains of chest pain this morning, says that it improves with morphine. Denies N/V and worsening SOB. Withdraws from light tough near sternum. Otherwise the patient is in no acute distress.   BP (!) 181/76 (BP Location: Right Arm)   Pulse 72   Temp 98.4 F (36.9 C) (Oral)   Resp 14   Ht 5\' 6"  (1.676 m)   Wt 75.9 kg (167 lb 6.4 oz)   SpO2 100%   BMI 27.02 kg/m   Intake/Output Summary (Last 24 hours) at 07/08/16 0957 Last data filed at 07/07/16 1815  Gross per 24 hour  Intake              360 ml  Output                0 ml  Net              360 ml    PHYSICAL EXAM General appearance: alert and cooperative Lungs: clear to auscultation bilaterally Heart: regular rate and rhythm and S1, S2 normal Abdomen: soft, non-distended, non-tender to palpation   LABS/IMAGING: Troponin I     Status: Abnormal   Collection Time: 07/07/16  2:28 PM  Result Value Ref Range   Troponin I 0.22 (HH) <0.03 ng/mL  CRITICAL RESULT CALLED TO, READ BACK BY AND VERIFIED WITH: RN DIMOLA,M AT 1528 [id number] MARTINB           Troponin I     Status: Abnormal   Collection Time: 07/07/16  8:09 PM  Result Value Ref Range   Troponin I 0.23 (HH) <0.03 ng/mL    Comment: CRITICAL VALUE NOTED.  VALUE IS CONSISTENT WITH PREVIOUSLY REPORTED AND CALLED VALUE.  Troponin I     Status: Abnormal   Collection Time: 07/08/16  1:23 AM  Result Value Ref Range   Troponin I 0.23 (HH) <0.03 ng/mL    Comment: CRITICAL VALUE NOTED.  VALUE IS CONSISTENT WITH PREVIOUSLY REPORTED AND CALLED VALUE.  MRSA PCR Screening     Status: None  Collection Time: 07/07/16  6:15 PM  Result Value Ref Range  MRSA by PCR NEGATIVE NEGATIVE   Comment:        The GeneXpert MRSA Assay (FDA approved for NASAL specimens only), is one component of a comprehensive MRSA colonization surveillance program. It is not intended to diagnose MRSA infection nor to guide or monitor treatment for MRSA infections.    Current  Meds: . acetaminophen  650 mg Oral Once  . amLODipine  10 mg Oral QHS  . aspirin EC  81 mg Oral QHS  . calcium acetate  1,334 mg Oral TID WC  . cinacalcet  60 mg Oral QHS  . fenofibrate  160 mg Oral Daily  . heparin  5,000 Units Subcutaneous Q8H  . insulin aspart  0-9 Units Subcutaneous TID WC  . insulin aspart  3 Units Subcutaneous TID WC  . latanoprost  1 drop Both Eyes QHS  . losartan  100 mg Oral QPM  . multivitamin  1 tablet Oral QPM  . omega-3 acid ethyl esters  1 g Oral Daily  . pantoprazole  40 mg Oral Daily     ASSESSMENT AND PLAN: Ms. Colleen Mullins is a 70 y.o. female with a past medical history significant for ESRD on HD, DM2, HTN who presents with sharp non-radiating substernal chest pain.       Patient Active Problem List   Diagnosis Date Noted  . Chest pain 07/07/2016  . Dyspnea on  exertion 04/14/2016  . SOB (shortness of breath) 04/14/2016  . ESRD on dialysis (HCC) 04/14/2016  . Benign essential HTN 04/14/2016  . Hyperlipidemia 04/14/2016  . Type 2 diabetes mellitus with renal complication (HCC) 09/26/2011   Chest pain: Patient chest pain is intermittent, and is provoked by light touch on sternum, but is relieved with nitroglyceryn. The patient has no pain, warmth, or erythema around IJV line site, patient is afebrile and WBC is 7.0.  EKG did not demonstrate STEMI, troponin trended 0.22>0.23>0.23. Patient troponin trended during admission on 04/14/2016 and was between 0.21-0.25. CXR showed enlargement of cardiac silhouette with pulmonary vascular congestion, with no other signs of cardiopulmonary processes. 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 Patient is afebrile and hemodynamically stable. H/H is 10.1/33.2, which are at her baseline. There are no symptoms of GI bleed, and lipase was 48. She had previously taken Nexium for GERD, but is no longer taking it. BMP is notable for Cr of 4.83 and  Ca 8.1, patient with normal potassium of 4.2. Also on differential is GERD, MSK pain and herpes zoster. - Cardiology consulted for ischemia workup - Troponin 0.22>0.23>0.23 - Continue Protonix 40 mg daily  - Continue Aspirin 81 mg daily  ESRD: Has HD Tu/Th/Sa, last session yesterday. Last bicarb 30, BUN 52. - Continue dialysis Tu/Th/Sa.  Hypertension: Patient takes amlodipine 10 mg, losartan 100 mg, and labetalol 450 mg bid at home, holding labetalol since admission. BP 154/59 on presentation, latest 181/76.  - Continue home amlodipine 10 mg daily - Continue home losartan 100 mg qd - Start labetalol 100 mg bid and up-titrate back to home dose  T2DM: glucose was 297 on presentation, latest reading of 137. Patient uses insulin aspart at home 0-9 units 3 times daily with meals - Sliding scale insulin - Modified carbohydrate diet - obtain HbA1c  Joint pain: patient reports long-standing joint pain in hands, R knee > L knee, and left shoulder. Denies having rheumatological workup. - Acetaminophen 650 mg PRN  DVT/VTE prophylaxis: - heparin 5,000 U q8h   Dispo: Anticipated discharge in approximately 1-2 day(s).    Signed: Will Colgate PalmoliveSchreiner MS4 [employer] 8/14/20179:57 AM

## 2016-07-08 NOTE — Care Management Obs Status (Signed)
MEDICARE OBSERVATION STATUS NOTIFICATION   Patient Details  Name: Colleen PlowmanDianne Mullins MRN: [id number] Date of Birth: 06/20/1946   Medicare Observation Status Notification Given:  Yes    MayoChapman Fitch, Josedaniel Haye T, RN 07/08/2016, 10:44 AM

## 2016-07-08 NOTE — Consult Note (Signed)
Cardiology Consultation Note    Patient ID: Colleen PlowmanDianne Mullins, MRN: [id number], DOB/AGE: 71/11/1945 71 y.o. Admit date: 07/07/2016   Date of Consult: 07/08/2016 Primary Physician: Zoila ShutterWOODYEAR,WYNNE E, MD Primary Cardiologist: New to Dr. Mayford Knifeurner  Chief Complaint: chest pain Reason for Consultation: chest pain, elevated troponin Requesting MD: Dr. Ladona Ridgelaylor  HPI: Colleen Mullins is a 70 y.o. 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 She has no formal history of CAD She was evaluated with a nuc in 2014 in prep for possible kidney transplant that was normal, EF 53%. Prior 2D echo in 2016 for elevated troponin showed nonobstructive mid/apical HOCM, severe LVH, EF 60-65%, calcified mitral annulus. Her troponin has been persistently elevated in the ~0.2 range since 2016.   Yesterday while eating a biscuit, she felt an abrupt sharp chest pain that almost made her drop to her knees. She has h/o acid reflux but this discomfort scared her so she called EMS. Her BP was 267/120 per her report. She received SL NTG but the pain did not resolve until later after she arrived. The pain was different than usual acid reflux. She also had SOB. No diaphoresis. She has had chronic unchanged DOE but this is ever since 03/2016 when she was told that she needed more fluid taken off at HD. She has had issues with her BP dropping at dialysis, though. No LEE. Workup thus far has shown multiple troponins in the 0.2 range. CXR: enlargement of cardiac silhouette with pulm vasc congestion. BPs this admission have been elevated, max of 215/79, min of 148/54. The internal medicine team has restarted the home labetalol that was held. She is also on amlodipine and losartan. She has chest pain tender to palpation but this is different than the discomfort that brought her in. She no longer has that sharp pain.   Past Medical History:   Diagnosis Date  . Anemia    low iron  . Arthritis   . Chronic kidney disease    Dialysis T/Th/Sa  . Diabetes mellitus    type 2  . Diabetic retinopathy (HCC)   . Dialysis patient [facility](HCC)    M-W-F @ [facility]  . Ejection fraction   . GERD (gastroesophageal reflux disease)   . Glaucoma   . H/O: Bell's palsy 2007  . Hyperlipidemia   . Hypertension   . Macular degeneration   . Shortness of breath dyspnea    with walking      Surgical History:  Past Surgical History:  Procedure Laterality Date  . ABDOMINAL HYSTERECTOMY    . ARTERIOVENOUS GRAFT PLACEMENT     Left forearm x 2, one removed in March  . ARTERIOVENOUS GRAFT PLACEMENT     Left forearm  . AVGG REMOVAL  10/08/2011   Procedure: REMOVAL OF ARTERIOVENOUS GORETEX GRAFT (AVGG);  Surgeon: Sherren Kernsharles E Fields, MD;  [facility];  Service: Vascular;  Laterality: Left;  . BTL    . COLONOSCOPY    . EXCHANGE OF A DIALYSIS CATHETER Right 03/27/2016   Procedure: EXCHANGE OF A DIALYSIS CATHETER;  Surgeon: Fransisco HertzBrian L Chen, MD;  [facility];  Service: Vascular;  Laterality: Right;  . EYE SURGERY Left    Lasik surgery on left. Cataract surgery on both eyes  . LAPAROTOMY  ?2000   Pelvic mass (Benign)  . REVISION OF ARTERIOVENOUS GORETEX GRAFT Left 03/27/2016   Procedure: REVISION OF LEFT ARTERIOVENOUS  GORETEX GRAFT;  Surgeon: Fransisco Hertz, MD;  Location: [facility] OR;  Service: Vascular;  Laterality: Left;  . TONSILLECTOMY       Home Meds: Prior to Admission medications   Medication Sig Start Date End Date Taking? Authorizing Provider  acetaminophen (TYLENOL) 325 MG tablet Take 650 mg by mouth every 6 (six) hours as needed for moderate pain.   Yes Historical Provider, MD  amLODipine (NORVASC) 10 MG tablet Take 10 mg by mouth at bedtime.    Yes Historical Provider, MD  aspirin EC 81 MG tablet Take 81 mg by mouth at bedtime.    Yes Historical Provider, MD  aspirin-acetaminophen-caffeine (EXCEDRIN MIGRAINE) [id number] MG tablet Take 2 tablets  by mouth every 6 (six) hours as needed for headache.   Yes Historical Provider, MD  bisacodyl (DULCOLAX) 5 MG EC tablet Take 5 mg by mouth daily as needed for moderate constipation.   Yes Historical Provider, MD  bismuth subsalicylate (KAOPECTATE) 262 MG/15ML suspension Take 30 mLs by mouth every 6 (six) hours as needed for indigestion or diarrhea or loose stools.   Yes Historical Provider, MD  calcium acetate (PHOSLO) 667 MG capsule Take 1,334 mg by mouth 3 (three) times daily with meals. Takes 2 caps with meals and 1 cap with snacks   Yes Historical Provider, MD  Chlorpheniramine-APAP (CORICIDIN) 2-325 MG TABS Take 2 tablets by mouth daily as needed (for cold).   Yes Historical Provider, MD  cinacalcet (SENSIPAR) 60 MG tablet Take 60 mg by mouth at bedtime.    Yes Historical Provider, MD  fenofibrate (TRICOR) 145 MG tablet Take 145 mg by mouth every evening.    Yes Historical Provider, MD  fish oil-omega-3 fatty acids 1000 MG capsule Take 1 g by mouth at bedtime.    Yes Historical Provider, MD  insulin aspart (NOVOLOG) 100 UNIT/ML injection Inject 0-9 Units into the skin 3 (three) times daily with meals. 10/18/15  Yes Jeralyn Bennett, MD  labetalol (NORMODYNE) 300 MG tablet Take 450 mg by mouth 2 (two) times daily. To take 450 mg BID   Yes Historical Provider, MD  losartan (COZAAR) 100 MG tablet Take 100 mg by mouth every evening.   Yes Historical Provider, MD  multivitamin (RENA-VIT) TABS tablet Take 1 tablet by mouth every evening.    Yes Historical Provider, MD  Naphazoline HCl (CLEAR EYES OP) Place 1 drop into both eyes as needed (for dry eyes).   Yes Historical Provider, MD  oxyCODONE-acetaminophen (PERCOCET/ROXICET) 5-325 MG tablet Take 1 tablet by mouth every 4 (four) hours as needed for severe pain.   Yes Historical Provider, MD  RaNITidine HCl (ACID REDUCER PO) Take 1 tablet by mouth as needed (for acid reflux).   Yes Historical Provider, MD  Travoprost, BAK Free, (TRAVATAN) 0.004 % SOLN  ophthalmic solution Place 1 drop into both eyes at bedtime.   Yes Historical Provider, MD  hydrALAZINE (APRESOLINE) 25 MG tablet Take 1 tablet (25 mg total) by mouth every 6 (six) hours. Patient not taking: Reported on 04/26/2016 04/15/16   Meredeth Ide, MD    Inpatient Medications:  . acetaminophen  650 mg Oral Once  . amLODipine  10 mg Oral QHS  . aspirin EC  81 mg Oral QHS  . calcium acetate  1,334 mg Oral TID WC  . cinacalcet  60 mg Oral QHS  . fenofibrate  160 mg Oral Daily  . heparin  5,000 Units Subcutaneous Q8H  . insulin aspart  0-9 Units Subcutaneous TID WC  .  insulin aspart  3 Units Subcutaneous TID WC  . labetalol  100 mg Oral BID  . latanoprost  1 drop Both Eyes QHS  . losartan  100 mg Oral QPM  . multivitamin  1 tablet Oral QPM  . omega-3 acid ethyl esters  1 g Oral Daily  . pantoprazole  40 mg Oral Daily      Allergies:  Allergies  Allergen Reactions  . Lactose Intolerance (Gi) Other (See Comments)    Abdominal pains and bloating    Social History   Social History  . Marital status: Widowed    Spouse name: N/A  . Number of children: N/A  . Years of education: N/A   Occupational History  . Not on file.   Social History Main Topics  . Smoking status: Former Smoker    Types: Cigarettes    Quit date: 08/04/1999  . Smokeless tobacco: Never Used  . Alcohol use No  . Drug use: No  . Sexual activity: Not on file   Other Topics Concern  . Not on file   Social History Narrative  . No narrative on file     Family History  Problem Relation Age of Onset  . Diabetes Mother   . Heart disease Mother   . COPD Father   . Hypertension Father   . Lung disease Father   . Diabetes Father   . Diabetes Sister   . Breast cancer Sister      Review of Systems: No syncope, fever, chills. All other systems reviewed and are otherwise negative except as noted above.  Labs:  Recent Labs  07/07/16 1428 07/07/16 2009 07/08/16 0123  TROPONINI 0.22* 0.23* 0.23*     Lab Results  Component Value Date   WBC 7.3 07/07/2016   HGB 10.1 (L) 07/07/2016   HCT 33.2 (L) 07/07/2016   MCV 86.7 07/07/2016   PLT 319 07/07/2016    Recent Labs Lab 07/07/16 1220 07/07/16 1428  NA 136 136  K 4.0 4.2  CL 95* 97*  CO2 31 30  BUN 49* 52*  CREATININE 4.83* 5.05*  CALCIUM 8.1* 8.2*  PROT 6.5  --   BILITOT 0.4  --   ALKPHOS 60  --   ALT 20  --   AST 27  --   GLUCOSE 297* 195*   Radiology/Studies:  Dg Chest 2 View  Result Date: 07/07/2016 CLINICAL DATA:  Awoke this morning with shortness of breath and chest pain, improvement in symptoms with nitroglycerin, hypertension, end-stage renal disease on dialysis, hyperlipidemia, type II diabetes mellitus EXAM: CHEST  2 VIEW COMPARISON:  06/12/2016 FINDINGS: RIGHT jugular central venous catheter with tip projecting over cavoatrial junction. Enlargement of cardiac silhouette with pulmonary vascular congestion. Atherosclerotic calcification aorta. Bibasilar atelectasis. No gross infiltrate, pleural effusion or pneumothorax. Bones demineralized. IMPRESSION: Bibasilar atelectasis. Enlargement of cardiac silhouette with pulmonary vascular congestion. Electronically Signed   By: Ulyses SouthwardMark  Boles M.D.   On: 07/07/2016 11:48   Dg Chest 2 View  Result Date: 06/12/2016 CLINICAL DATA:  Shortness of breath for 1 week.  No injury. EXAM: CHEST  2 VIEW COMPARISON:  04/15/2016 FINDINGS: The lungs are clear wiithout focal pneumonia, edema, pneumothorax or pleural effusion. Subsegmental atelectasis at the lung bases slightly improved in the interval. The cardiopericardial silhouette is within normal limits for size. Right IJ dialysis catheter tip is at the distal SVC level. IMPRESSION: Cardiomegaly with basilar atelectasis. Electronically Signed   By: Kennith CenterEric  Mansell M.D.   On: 06/12/2016 12:07  Wt Readings from Last 3 Encounters:  07/08/16 167 lb 6.4 oz (75.9 kg)  04/26/16 167 lb (75.8 kg)  04/16/16 152 lb 5.4 oz (69.1 kg)    EKG: NSR,  no acute changes, unchanged from prior.  Physical Exam: Blood pressure (!) 178/77, pulse 66, temperature 98.3 F (36.8 C), temperature source Oral, resp. rate (!) 24, height 5\' 6"  (1.676 m), weight 167 lb 6.4 oz (75.9 kg), SpO2 98 %. Body mass index is 27.02 kg/m. General: Well developed, well nourished AAF in no acute distress. Head: Normocephalic, atraumatic, sclera non-icteric, no xanthomas, nares are without discharge.  Neck: Negative for carotid bruits. JVD not elevated. Lungs: Clear bilaterally to auscultation without wheezes, rales, or rhonchi. Breathing is unlabored. Heart: RRR with S1 S2. No murmurs, rubs, or gallops appreciated. Abdomen: Soft, non-tender, non-distended with normoactive bowel sounds. No hepatomegaly. No rebound/guarding. No obvious abdominal masses. Msk:  Strength and tone appear normal for age. Extremities: No clubbing or cyanosis. No edema.  Distal pedal pulses are 2+ and equal bilaterally. Neuro: Alert and oriented X 3. No facial asymmetry. No focal deficit. Moves all extremities spontaneously. Psych:  Responds to questions appropriately with a normal affect.   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  1. Chest pain/dyspnea - chest pain is atypical, question related to severe hypertension (or question whether pain contributed to high BP). Troponin elevation appears chronic and unchanged from before, in the setting of ESRD. Her dyspnea on exertion is concerning, however, in the face of multiple cardiac risk factors. Will proceed with nuc in AM.  2. Abnormal echo with LVH/possible HOCM in 2016 - f/u echo.   3. HTN - primary team is attending to this issue and currently adjusting meds.  Charlyn Minerva PA-C 07/08/2016, 12:24 PM Pager: [phone]

## 2016-07-09 ENCOUNTER — Observation Stay (HOSPITAL_COMMUNITY): Payer: Medicare HMO

## 2016-07-09 ENCOUNTER — Observation Stay (HOSPITAL_BASED_OUTPATIENT_CLINIC_OR_DEPARTMENT_OTHER): Payer: Medicare HMO

## 2016-07-09 DIAGNOSIS — G8929 Other chronic pain: Secondary | ICD-10-CM | POA: Diagnosis not present

## 2016-07-09 DIAGNOSIS — M25561 Pain in right knee: Secondary | ICD-10-CM

## 2016-07-09 DIAGNOSIS — N186 End stage renal disease: Secondary | ICD-10-CM

## 2016-07-09 DIAGNOSIS — I120 Hypertensive chronic kidney disease with stage 5 chronic kidney disease or end stage renal disease: Secondary | ICD-10-CM

## 2016-07-09 DIAGNOSIS — Z794 Long term (current) use of insulin: Secondary | ICD-10-CM

## 2016-07-09 DIAGNOSIS — E1122 Type 2 diabetes mellitus with diabetic chronic kidney disease: Secondary | ICD-10-CM

## 2016-07-09 DIAGNOSIS — I10 Essential (primary) hypertension: Secondary | ICD-10-CM | POA: Diagnosis not present

## 2016-07-09 DIAGNOSIS — Z992 Dependence on renal dialysis: Secondary | ICD-10-CM

## 2016-07-09 DIAGNOSIS — R079 Chest pain, unspecified: Secondary | ICD-10-CM

## 2016-07-09 DIAGNOSIS — R7989 Other specified abnormal findings of blood chemistry: Secondary | ICD-10-CM | POA: Diagnosis not present

## 2016-07-09 DIAGNOSIS — M25461 Effusion, right knee: Secondary | ICD-10-CM

## 2016-07-09 DIAGNOSIS — R0789 Other chest pain: Secondary | ICD-10-CM | POA: Diagnosis not present

## 2016-07-09 LAB — RENAL FUNCTION PANEL
ALBUMIN: 3 g/dL — AB (ref 3.5–5.0)
ANION GAP: 11 (ref 5–15)
BUN: 78 mg/dL — ABNORMAL HIGH (ref 6–20)
CALCIUM: 9 mg/dL (ref 8.9–10.3)
CO2: 26 mmol/L (ref 22–32)
Chloride: 96 mmol/L — ABNORMAL LOW (ref 101–111)
Creatinine, Ser: 7.84 mg/dL — ABNORMAL HIGH (ref 0.44–1.00)
GFR, EST AFRICAN AMERICAN: 5 mL/min — AB (ref >60)
GFR, EST NON AFRICAN AMERICAN: 5 mL/min — AB (ref >60)
GLUCOSE: 174 mg/dL — AB (ref 65–99)
PHOSPHORUS: 3.1 mg/dL (ref 2.5–4.6)
Potassium: 4.7 mmol/L (ref 3.5–5.1)
SODIUM: 133 mmol/L — AB (ref 135–145)

## 2016-07-09 LAB — CBC
HEMATOCRIT: 34.7 % — AB (ref 36.0–46.0)
HEMOGLOBIN: 11.1 g/dL — AB (ref 12.0–15.0)
MCH: 27 pg (ref 26.0–34.0)
MCHC: 32 g/dL (ref 30.0–36.0)
MCV: 84.4 fL (ref 78.0–100.0)
PLATELETS: 431 10*3/uL — AB (ref 150–400)
RBC: 4.11 MIL/uL (ref 3.87–5.11)
RDW: 17.7 % — ABNORMAL HIGH (ref 11.5–15.5)
WBC: 11.8 10*3/uL — ABNORMAL HIGH (ref 4.0–10.5)

## 2016-07-09 LAB — ECHOCARDIOGRAM COMPLETE
HEIGHTINCHES: 66 in
Weight: 2720 oz

## 2016-07-09 LAB — NM MYOCAR MULTI W/SPECT W/WALL MOTION / EF
CHL CUP MPHR: 149 {beats}/min
CHL CUP RESTING HR STRESS: 94 {beats}/min
CSEPEW: 1 METS
CSEPHR: 67 %
Peak HR: 100 {beats}/min

## 2016-07-09 LAB — HEPARIN LEVEL (UNFRACTIONATED): Heparin Unfractionated: 0.49 IU/mL (ref 0.30–0.70)

## 2016-07-09 LAB — GLUCOSE, CAPILLARY
GLUCOSE-CAPILLARY: 402 mg/dL — AB (ref 65–99)
GLUCOSE-CAPILLARY: 282 mg/dL — AB (ref 65–99)
Glucose-Capillary: 178 mg/dL — ABNORMAL HIGH (ref 65–99)
Glucose-Capillary: 210 mg/dL — ABNORMAL HIGH (ref 65–99)
Glucose-Capillary: 139 mg/dL — ABNORMAL HIGH (ref 65–99)

## 2016-07-09 MED ORDER — REGADENOSON 0.4 MG/5ML IV SOLN
INTRAVENOUS | Status: AC
  Filled 2016-07-09: qty 5

## 2016-07-09 MED ORDER — OXYCODONE-ACETAMINOPHEN 5-325 MG PO TABS
ORAL_TABLET | ORAL | Status: AC
  Filled 2016-07-09: qty 1

## 2016-07-09 MED ORDER — SODIUM CHLORIDE 0.9 % IV SOLN: INTRAVENOUS | Status: DC

## 2016-07-09 MED ORDER — TECHNETIUM TC 99M TETROFOSMIN IV KIT
30.0000 | PACK | Freq: Once | INTRAVENOUS | Status: AC | PRN
  Administered 2016-07-09: 30 via INTRAVENOUS

## 2016-07-09 MED ORDER — HEPARIN (PORCINE) IN NACL 100-0.45 UNIT/ML-% IJ SOLN
900.0000 [IU]/h | INTRAMUSCULAR | Status: DC
  Administered 2016-07-09: 900 [IU]/h via INTRAVENOUS
  Filled 2016-07-09: qty 250

## 2016-07-09 MED ORDER — INSULIN ASPART 100 UNIT/ML ~~LOC~~ SOLN
9.0000 [IU] | Freq: Once | SUBCUTANEOUS | Status: AC
  Administered 2016-07-09: 9 [IU] via SUBCUTANEOUS

## 2016-07-09 MED ORDER — HYDROMORPHONE HCL 2 MG PO TABS
ORAL_TABLET | ORAL | Status: AC
  Administered 2016-07-09: 1 mg
  Filled 2016-07-09: qty 1

## 2016-07-09 MED ORDER — INSULIN ASPART 100 UNIT/ML ~~LOC~~ SOLN: 10.0000 [IU] | Freq: Once | SUBCUTANEOUS | Status: DC

## 2016-07-09 MED ORDER — ASPIRIN 81 MG PO CHEW
81.0000 mg | CHEWABLE_TABLET | ORAL | Status: AC
  Administered 2016-07-10: 81 mg via ORAL
  Filled 2016-07-09: qty 1

## 2016-07-09 MED ORDER — TECHNETIUM TC 99M TETROFOSMIN IV KIT
10.0000 | PACK | Freq: Once | INTRAVENOUS | Status: AC | PRN
  Administered 2016-07-09: 10 via INTRAVENOUS

## 2016-07-09 MED ORDER — REGADENOSON 0.4 MG/5ML IV SOLN
0.4000 mg | Freq: Once | INTRAVENOUS | Status: AC
  Administered 2016-07-09: 0.4 mg via INTRAVENOUS
  Filled 2016-07-09: qty 5

## 2016-07-09 MED ORDER — HEPARIN BOLUS VIA INFUSION
4000.0000 [IU] | Freq: Once | INTRAVENOUS | Status: AC
  Administered 2016-07-09: 4000 [IU] via INTRAVENOUS
  Filled 2016-07-09: qty 4000

## 2016-07-09 MED ORDER — HYDROMORPHONE HCL 2 MG PO TABS: 1.0000 mg | ORAL_TABLET | Freq: Once | ORAL | Status: DC

## 2016-07-09 NOTE — Progress Notes (Signed)
Subjective: No acute events overnight. Patient was not complaining of chest pain this morning. She denies nausea,vomiting or abdominal pain. She does complain of arthritic pain most severe in her right knee. We discussed doing a potential arthrocentesis and steroid injection but on ultrasound of her right knee she had minimal fluid and the patient did not want an injection at this time. She has no additional acute concerns.  Objective:  Vital signs in last 24 hours: Vitals:   07/09/16 0912 07/09/16 0928 07/09/16 0930 07/09/16 0931  BP: (!) 200/73 (!) 160/67 (!) 160/67 (!) 186/72  Pulse:      Resp:      Temp:      TempSrc:      SpO2:      Weight:      Height:       Physical Exam  Constitutional: She is oriented to person, place, and time. She appears well-developed and well-nourished.  In no acute distress  HENT:  Head: Normocephalic and atraumatic.  Left EJ in place  Cardiovascular: Normal rate and regular rhythm.  Exam reveals no gallop and no friction rub.   No murmur heard. Respiratory: Effort normal and breath sounds normal. No respiratory distress. She has no wheezes.  GI: Soft. Bowel sounds are normal. She exhibits no distension.  No abdominal bruits auscultated  Musculoskeletal: She exhibits no edema.  Swollen right knee with mild effusion. No warmth or erythema noted in this area. This is chronic for the patient and related to her arthritis  Neurological: She is alert and oriented to person, place, and time.     Assessment/Plan:  Mrs. Jenean LindauRhodes is a 70 year old female with history of hypertension, diabetes, gastroesophageal reflux disease and end-stage renal disease on dialysis T, T, S who presented with a one-day history of substernal chest pain relieved with nitroglycerin, EKG without acute ST segment changes and a positive troponin.  1. Chest Pain -- Patient presents with substernal chest pain relieved with nitroglycerin. Patient hemodynamically stable with EKG not  demonstrating STEMI. Echocardiogram on 10/18/2015 with normal ejection fraction of 60-65%.Patient with multiple cardiac risk factors including hypertension, hyperlipidemia, type 2 diabetes and end-stage renal disease. Differential diagnosis includes NSTEMI, demand ischemia 2/2 hypertensive emergency,gastroesophageal reflux disease, MSK pain, peptic ulcer disease or other cardiac pathology. -- EKG without evidence of acute infarct -- Most recent troponin of 0.23 -- Cardiology consulted, had 2-D echo and stress test this morning will follow-up with results -- Acetaminophen 650 mg as needed for pain -- Aspirin 81 mg daily --Start Protonix 40 mg once daily for gastroesophageal reflux disease  2. Hypertension -- Patient with history of hypertension. Takes amlodipine 10 mg, losartan 100 mg and labetalol 450 mg 2 times daily. Blood pressure at time of presentation 143/83. -- Continue amlodipine 10 mg once daily and losartan 100 mg once daily -- Start Labetalol 100 mg BID and titrate up to home dose as tolerated -- Dialysis this afternoon  3. ESRD -- Patient with end-stage renal disease on hemodialysis T, T, S.  -- Continue current dialysis regimen -- Dialysis this afternoon  4. Diabetes Mellitus Type II -- Patient with history of diabetes. Glucose at time of presentation to 297. Patient uses insulin aspart at home 0-9 units 3 times daily with meals -- Sliding scale insulin -- Modified carbohydrate diet -- Hemoglobin A1c  5. DVT/PE prophylaxis -- Heparin injection 5000 units every 8 hours  Dispo: Anticipated discharge in approximately 1 day.   Thomasene LotJames Vandana Haman, MD 07/09/2016, 1:13 PM Pager:  [phone] 

## 2016-07-09 NOTE — Progress Notes (Signed)
  Echocardiogram 2D Echocardiogram has been performed.  Arvil ChacoFoster, Kennya Schwenn 07/09/2016, 12:47 PM

## 2016-07-09 NOTE — Progress Notes (Addendum)
Nuclear stress test is noted be abnormal with large region of reversible ischemia within the anterior and anterior lateral wall, EF 36%. 2D echo is pending. Per prior discussion with Dr. Mayford Knifeurner, will need LHC to further evaluate. I spoke with the patient while she was up in HD. Risks and benefits of cardiac catheterization have been discussed with the patient. These include bleeding, infection, stroke, heart attack, death. (Kidney damage not applicable.) The patient understands these risks and is willing to proceed. Will plan for tomorrow morning. Per discussion with Dr. Mayford Knifeurner, will also change heparin to per pharmacy dosing given + trop and high risk nuc. Dayna Dunn PA-C

## 2016-07-09 NOTE — Progress Notes (Signed)
ANTICOAGULATION CONSULT NOTE - Initial Consult  Pharmacy Consult for heparin Indication: chest pain/ACS  Allergies  Allergen Reactions  . Lactose Intolerance (Gi) Other (See Comments)    Abdominal pains and bloating    Patient Measurements: Height: 5\' 6"  (167.6 cm) Weight: 170 lb (77.1 kg) IBW/kg (Calculated) : 59.3 Heparin Dosing Weight: 74.5kg  Vital Signs: Temp: 99.1 F (37.3 C) (08/15 0458) Temp Source: Oral (08/15 0458) BP: 186/72 (08/15 0931) Pulse Rate: 81 (08/15 0458)  Labs:  Recent Labs  07/07/16 1220 07/07/16 1428 07/07/16 2009 07/08/16 0123  HGB 10.3* 10.1*  --   --   HCT 33.3* 33.2*  --   --   PLT 313 319  --   --   CREATININE 4.83* 5.05*  --   --   TROPONINI  --  0.22* 0.23* 0.23*    Estimated Creatinine Clearance: 10.7 mL/min (by C-G formula based on SCr of 5.05 mg/dL).   Medical History: Past Medical History:  Diagnosis Date  . Anemia    low iron  . Arthritis   . Diabetes mellitus    type 2  . Diabetic retinopathy (HCC)   . Dialysis patient [facility](HCC)    M-W-F @ [facility]  . Ejection fraction   . ESRD on hemodialysis [facility](HCC)    Dialysis T/Th/Sa  . GERD (gastroesophageal reflux disease)   . Glaucoma   . H/O: Jamilah Jean's palsy 2007  . Hyperlipidemia   . Hypertension    a. labile - hx of BP dropping at HD.  . Macular degeneration   . Shortness of breath dyspnea    with walking    Assessment: [age] YOF with hx of ESRD on HD TTS presented with chest pain and positive troponin. neuclear stress test abnormal, [facility], pharmacy is consulted to start IV heparin. Pt is currently on sq heparin. No anticoagulation PTA. Currently in hemodialysis. Baseline hgb 10.1, pltc 319K  Goal of Therapy:  INR 2-3 Monitor platelets by anticoagulation protocol: Yes   Plan:  - Heparin bolus 4000 units - Heparin infusion 900 units/hr - f/u 8 hr heparin level at 2230 - Daily heparin level and CBC - f/u plans for cath - d/c sq heparin  Bayard HuggerMei Yalena Colon, PharmD,  BCPS  Clinical Pharmacist  Pager: [phone]  07/09/2016,2:09 PM

## 2016-07-09 NOTE — Progress Notes (Signed)
The patient was seen in nuclear medicine for a Lexiscan myoview. She tolerated the procedure well. No acute ST or TW changes on ECG.   PERFUSION RESULTS TO FOLLOW  Colleen RighterErin Smith, NP  [facility]

## 2016-07-09 NOTE — Progress Notes (Signed)
SUBJECTIVE: patient returned from cardiac stress test. States that she has a dull chest pin and can feel some pain in left lower portion of her chest. She denies worsened shortness of breath and abdominal pain.   BP (!) 149/60 (BP Location: Left Arm)   Pulse 81   Temp 99.1 F (37.3 C) (Oral)   Resp 20   Ht 5\' 6"  (1.676 m)   Wt 77.1 kg (170 lb)   SpO2 97%   BMI 27.44 kg/m   Intake/Output Summary (Last 24 hours) at 07/09/16 0847 Last data filed at 07/08/16 2100  Gross per 24 hour  Intake              900 ml  Output                0 ml  Net              900 ml    PHYSICAL EXAM  General appearance: alert, cooperative and no distress Lungs: clear to auscultation bilaterally Heart: regular rate and rhythm and S1, S2 normal Abdomen: soft, non-tender, non-distended, normoactive bowel sounds  LABS/IMAGING:  Current Meds: . acetaminophen  650 mg Oral Once  . amLODipine  10 mg Oral QHS  . aspirin EC  81 mg Oral QHS  . calcium acetate  667 mg Oral TID WC  . fenofibrate  160 mg Oral Daily  . heparin  5,000 Units Subcutaneous Q8H  . insulin aspart  0-9 Units Subcutaneous TID WC  . insulin aspart  3 Units Subcutaneous TID WC  . labetalol  100 mg Oral BID  . latanoprost  1 drop Both Eyes QHS  . losartan  100 mg Oral QPM  . multivitamin  1 tablet Oral QPM  . omega-3 acid ethyl esters  1 g Oral Daily  . pantoprazole  40 mg Oral Daily  . regadenoson  0.4 mg Intravenous Once   ASSESSMENT AND PLAN: Ms. Colleen Mullins is a 70 y.o. femalewith a past medical history significant for ESRD on HD, DM2, HTN who presented with sharp non-radiating substernal chest pain that was relieved with nitroglycerine, but subsided by our initial evaluation. Patient continues to demonstrate exquisite tenderness to light touch over sternum.   Active Problems:   Type 2 diabetes mellitus with renal complication (HCC)   Elevated troponin   ESRD on dialysis (HCC)   Benign essential HTN   Hyperlipidemia   Pain in  the chest   Anemia, unspecified   1. Chest Pain  Patient chest pain is intermittent, and is provoked by light touch on sternum. The patient has no pain, warmth, or erythema around IJV line site, patient is afebrile and WBC is 7.0.  EKG did not demonstrate STEMI, troponin trended 0.22>0.23>0.23. Patient troponin trended during admission on 04/14/2016 and was between 0.21-0.25. CXR showed enlargement of cardiac silhouette with pulmonary vascular congestion, with no other signs of cardiopulmonary processes. ECHO 07/09/2016 showed severe LV concentric hypertrophy. Systolic function was vigorous. The estimated ejection fraction was in the range of 65% to 70% . Patient is afebrile and hemodynamically stable. H/H is 10.1/33.2, which are at her baseline. There are no symptoms of GI bleed, and lipase was 48. She had previously taken Nexium for GERD, but is no longer taking it. BMP is notable for Cr of 4.83 and Ca 8.1, patient with normal potassium of 4.2.  Also on differential is GERD, MSK pain and herpes zoster. - EKG without evidence of acute infarct - Most recent troponin of 0.23 -  Cardiology consulted, had 2-D echo and stress test this morning will follow-up with results - Troponin 0.22>0.23>0.23 - Continue Protonix 40 mg daily  - Continue Aspirin 81 mg daily  2. Hypertension: Patient takes amlodipine 10 mg, losartan 100 mg, and labetalol 450 mg bid at home, holding labetalol at admission admission. BP 154/59 on presentation, latest 186/72.  - Patient with history of hypertension. Takes amlodipine 10 mg, losartan 100 mg and labetalol 450 mg 2 times daily. Blood pressure at time of presentation 143/83. - Continue home amlodipine 10 mg daily - Continue home losartan 100 mg qd - Start labetalol 100 mg bid and up-titrate back to home dose  3. ESRD - Continue dialysis Tu/Th/Sa - Dialysis today  4. Diabetes Mellitus Type II: glucose was 297 on presentation. Patient uses insulin aspart at home 0-9 units  3 times daily with meals - Sliding scale insulin - Modified carbohydrate diet - obtain HbA1c  5. Joint pain: patient reports long-standing joint pain in hands, R knee > L knee, and left shoulder. Denies having rheumatological workup. - Acetaminophen 650 mg PRN  6.  DVT/PE prophylaxis - heparin 5,000 U q8h  Dispo: Anticipated discharge in approximately 1 day.  Will Zyairah Wacha MS4 8/15/20178:47 AM

## 2016-07-09 NOTE — Progress Notes (Signed)
Patient Name: Colleen PlowmanDianne Mullins Date of Encounter: 07/09/2016  Active Problems:   Type 2 diabetes mellitus with renal complication (HCC)   Elevated troponin   ESRD on dialysis (HCC)   Benign essential HTN   Hyperlipidemia   Pain in the chest   Anemia, unspecified   Primary Cardiologist: Dr. Ladona Ridgelaylor Patient Profile: Colleen Mullins is a 70 y.o. 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 She has no formal history of CAD She was evaluated with a nuc in 2014 in prep for possible kidney transplant that was normal, EF 53%. Prior 2D echo in 2016 for elevated troponin showed nonobstructive mid/apical HOCM, severe LVH, EF 60-65%, calcified mitral annulus. Her troponin has been persistently elevated in the ~0.2 range since 2016.   SUBJECTIVE: Feels ok, denies chest pain and SOB.   OBJECTIVE Vitals:   07/08/16 2136 07/09/16 0049 07/09/16 0458 07/09/16 0912  BP: (!) 163/64 (!) 181/70 (!) 149/60 (!) 200/73  Pulse: 72 72 81   Resp:  (!) 23 20   Temp: 99.9 F (37.7 C) 98.3 F (36.8 C) 99.1 F (37.3 C)   TempSrc: Oral Oral Oral   SpO2: 100% 98% 97%   Weight:   170 lb (77.1 kg)   Height:        Intake/Output Summary (Last 24 hours) at 07/09/16 0929 Last data filed at 07/08/16 2100  Gross per 24 hour  Intake              900 ml  Output                0 ml  Net              900 ml   Filed Weights   07/07/16 1825 07/08/16 0500 07/09/16 0458  Weight: 166 lb 3.2 oz (75.4 kg) 167 lb 6.4 oz (75.9 kg) 170 lb (77.1 kg)    PHYSICAL EXAM General: Well developed, well nourished, female in no acute distress. Head: Normocephalic, atraumatic.  Neck: Supple without bruits, no JVD. Lungs:  Resp regular and unlabored, CTA. Heart: RRR, S1, S2, no S3, S4, or murmur; no rub. Abdomen: Soft, non-tender, non-distended, BS + x 4.  Extremities: No clubbing, cyanosis, no edema.  Neuro:  Alert and oriented X 3. Moves all extremities spontaneously. Psych: Normal affect.  LABS: CBC: Recent Labs  07/07/16 1220 07/07/16 1428  WBC 7.0 7.3  NEUTROABS 4.9  --   HGB 10.3* 10.1*  HCT 33.3* 33.2*  MCV 86.7 86.7  PLT 313 319   Basic Metabolic Panel: Recent Labs  07/07/16 1220 07/07/16 1428  NA 136 136  K 4.0 4.2  CL 95* 97*  CO2 31 30  GLUCOSE 297* 195*  BUN 49* 52*  CREATININE 4.83* 5.05*  CALCIUM 8.1* 8.2*   Liver Function Tests: Recent Labs  07/07/16 1220  AST 27  ALT 20  ALKPHOS 60  BILITOT 0.4  PROT 6.5  ALBUMIN 3.5   Cardiac Enzymes: Recent Labs  07/07/16 1428 07/07/16 2009 07/08/16 0123  TROPONINI 0.22* 0.23* 0.23*    Recent Labs  07/07/16 1212  TROPIPOC 0.07     Current Facility-Administered Medications:  .  regadenoson (LEXISCAN) 0.4 MG/5ML injection SOLN, , , ,  .  acetaminophen (TYLENOL) tablet 650 mg, 650 mg, Oral, Q6H PRN **OR** acetaminophen (TYLENOL) suppository 650 mg, 650 mg, Rectal, Q6H PRN, Hyacinth Meekerasrif Ahmed, MD .  acetaminophen (TYLENOL) tablet 650 mg, 650 mg, Oral, Once, Tilden Fossa, MD .  amLODipine (NORVASC) tablet 10 mg, 10 mg, Oral, QHS, Tasrif Ahmed, MD, 10 mg at 07/08/16 2229 .  aspirin EC tablet 81 mg, 81 mg, Oral, QHS, Tasrif Ahmed, MD, 81 mg at 07/08/16 2229 .  bisacodyl (DULCOLAX) EC tablet 5 mg, 5 mg, Oral, Daily PRN, Tasrif Ahmed, MD .  calcium acetate (PHOSLO) capsule 667 mg, 667 mg, Oral, TID WC, Lenny Pastel, PA-C, 667 mg at 07/08/16 1648 .  diphenhydrAMINE (BENADRYL) capsule 25 mg, 25 mg, Oral, Q6H PRN, Hyacinth Meeker, MD, 25 mg at 07/09/16 0233 .  fenofibrate tablet 160 mg, 160 mg, Oral, Daily, Tasrif Ahmed, MD, 160 mg at 07/08/16 0831 .  heparin injection 5,000 Units, 5,000 Units, Subcutaneous, Q8H, Tasrif Ahmed, MD, 5,000 Units at 07/08/16 2228 .  insulin aspart (novoLOG) injection 0-9 Units, 0-9 Units, Subcutaneous, TID WC, Thomasene Lot, MD, 5 Units at 07/08/16 1646 .  insulin aspart (novoLOG) injection 3  Units, 3 Units, Subcutaneous, TID WC, Thomasene Lot, MD, 3 Units at 07/08/16 1648 .  labetalol (NORMODYNE) tablet 100 mg, 100 mg, Oral, BID, Thomasene Lot, MD, 100 mg at 07/08/16 2229 .  latanoprost (XALATAN) 0.005 % ophthalmic solution 1 drop, 1 drop, Both Eyes, QHS, Tasrif Ahmed, MD, 1 drop at 07/08/16 2228 .  losartan (COZAAR) tablet 100 mg, 100 mg, Oral, QPM, Tasrif Ahmed, MD, 100 mg at 07/08/16 1647 .  multivitamin (RENA-VIT) tablet 1 tablet, 1 tablet, Oral, QPM, Tasrif Ahmed, MD, 1 tablet at 07/08/16 2228 .  omega-3 acid ethyl esters (LOVAZA) capsule 1 g, 1 g, Oral, Daily, Tyson Alias, MD, 1 g at 07/08/16 2229 .  oxyCODONE-acetaminophen (PERCOCET/ROXICET) 5-325 MG per tablet 1 tablet, 1 tablet, Oral, Q4H PRN, Hyacinth Meeker, MD, 1 tablet at 07/09/16 0618 .  pantoprazole (PROTONIX) EC tablet 40 mg, 40 mg, Oral, Daily, Thomasene Lot, MD, 40 mg at 07/08/16 0831   TELE:   NSR     ECG: NSR no acute changes  Radiology/Studies: Dg Chest 2 View  Result Date: 07/07/2016 CLINICAL DATA:  Awoke this morning with shortness of breath and chest pain, improvement in symptoms with nitroglycerin, hypertension, end-stage renal disease on dialysis, hyperlipidemia, type II diabetes mellitus EXAM: CHEST  2 VIEW COMPARISON:  06/12/2016 FINDINGS: RIGHT jugular central venous catheter with tip projecting over cavoatrial junction. Enlargement of cardiac silhouette with pulmonary vascular congestion. Atherosclerotic calcification aorta. Bibasilar atelectasis. No gross infiltrate, pleural effusion or pneumothorax. Bones demineralized. IMPRESSION: Bibasilar atelectasis. Enlargement of cardiac silhouette with pulmonary vascular congestion. Electronically Signed   By: Ulyses Southward M.D.   On: 07/07/2016 11:48     Current Medications:  . regadenoson      . acetaminophen  650 mg Oral Once  . amLODipine  10 mg Oral QHS  . aspirin EC  81 mg Oral QHS  . calcium acetate  667 mg Oral TID WC  . fenofibrate  160 mg Oral  Daily  . heparin  5,000 Units Subcutaneous Q8H  . insulin aspart  0-9 Units Subcutaneous TID WC  . insulin aspart  3 Units Subcutaneous TID WC  . labetalol  100 mg Oral BID  . latanoprost  1 drop Both Eyes QHS  . losartan  100 mg Oral QPM  . multivitamin  1 tablet Oral QPM  . omega-3 acid ethyl esters  1 g Oral Daily  . pantoprazole  40 mg Oral Daily      ASSESSMENT AND PLAN: Active Problems:  Type 2 diabetes mellitus with renal complication (HCC)   Elevated troponin   ESRD on dialysis (HCC)   Benign essential HTN   Hyperlipidemia   Pain in the chest   Anemia, unspecified  1. Chest pain/dyspnea - chest pain is atypical, question related to severe hypertension (or question whether pain contributed to high BP). Troponin elevation appears chronic and unchanged from before, in the setting of ESRD. Her dyspnea on exertion is concerning, however, in the face of multiple cardiac risk factors. Nuclear study this am.   2. Abnormal echo with LVH/possible HOCM in 2016 - f/u echo.   3. HTN - primary team is attending to this issue and currently adjusting meds.  Signed, Little IshikawaErin E Cadence Haslam , NP 9:29 AM 07/09/2016 Pager [phone]

## 2016-07-10 ENCOUNTER — Encounter (HOSPITAL_COMMUNITY): Payer: Self-pay | Admitting: Internal Medicine

## 2016-07-10 ENCOUNTER — Encounter (HOSPITAL_COMMUNITY)
Admission: EM | Disposition: A | Discharge status: Home / Self Care | Payer: Self-pay | Source: Home / Self Care | Attending: Emergency Medicine

## 2016-07-10 DIAGNOSIS — Z9889 Other specified postprocedural states: Secondary | ICD-10-CM

## 2016-07-10 DIAGNOSIS — R931 Abnormal findings on diagnostic imaging of heart and coronary circulation: Secondary | ICD-10-CM

## 2016-07-10 DIAGNOSIS — I2510 Atherosclerotic heart disease of native coronary artery without angina pectoris: Secondary | ICD-10-CM | POA: Diagnosis not present

## 2016-07-10 DIAGNOSIS — R9439 Abnormal result of other cardiovascular function study: Secondary | ICD-10-CM

## 2016-07-10 DIAGNOSIS — R0789 Other chest pain: Secondary | ICD-10-CM

## 2016-07-10 DIAGNOSIS — E1165 Type 2 diabetes mellitus with hyperglycemia: Secondary | ICD-10-CM | POA: Diagnosis not present

## 2016-07-10 DIAGNOSIS — E1122 Type 2 diabetes mellitus with diabetic chronic kidney disease: Secondary | ICD-10-CM | POA: Diagnosis not present

## 2016-07-10 DIAGNOSIS — I129 Hypertensive chronic kidney disease with stage 1 through stage 4 chronic kidney disease, or unspecified chronic kidney disease: Secondary | ICD-10-CM

## 2016-07-10 HISTORY — PX: CARDIAC CATHETERIZATION: SHX172

## 2016-07-10 HISTORY — DX: Abnormal result of other cardiovascular function study: R94.39

## 2016-07-10 LAB — CBC
HCT: 34.8 % — ABNORMAL LOW (ref 36.0–46.0)
HCT: 34.2 % — ABNORMAL LOW (ref 36.0–46.0)
HEMOGLOBIN: 10.8 g/dL — AB (ref 12.0–15.0)
Hemoglobin: 10.7 g/dL — ABNORMAL LOW (ref 12.0–15.0)
MCH: 26.4 pg (ref 26.0–34.0)
MCH: 26.9 pg (ref 26.0–34.0)
MCHC: 31 g/dL (ref 30.0–36.0)
MCHC: 31.3 g/dL (ref 30.0–36.0)
MCV: 85.1 fL (ref 78.0–100.0)
MCV: 85.9 fL (ref 78.0–100.0)
PLATELETS: 395 10*3/uL (ref 150–400)
Platelets: 430 10*3/uL — ABNORMAL HIGH (ref 150–400)
RBC: 4.09 MIL/uL (ref 3.87–5.11)
RBC: 3.98 MIL/uL (ref 3.87–5.11)
RDW: 17.7 % — ABNORMAL HIGH (ref 11.5–15.5)
RDW: 18 % — ABNORMAL HIGH (ref 11.5–15.5)
WBC: 9.6 10*3/uL (ref 4.0–10.5)
WBC: 7.4 10*3/uL (ref 4.0–10.5)

## 2016-07-10 LAB — BASIC METABOLIC PANEL
Anion gap: 13 (ref 5–15)
BUN: 26 mg/dL — AB (ref 6–20)
CHLORIDE: 93 mmol/L — AB (ref 101–111)
CO2: 28 mmol/L (ref 22–32)
Calcium: 9.4 mg/dL (ref 8.9–10.3)
Creatinine, Ser: 4.31 mg/dL — ABNORMAL HIGH (ref 0.44–1.00)
GFR calc Af Amer: 11 mL/min — ABNORMAL LOW (ref >60)
GFR calc non Af Amer: 9 mL/min — ABNORMAL LOW (ref >60)
GLUCOSE: 108 mg/dL — AB (ref 65–99)
POTASSIUM: 4.5 mmol/L (ref 3.5–5.1)
Sodium: 134 mmol/L — ABNORMAL LOW (ref 135–145)

## 2016-07-10 LAB — GLUCOSE, CAPILLARY
GLUCOSE-CAPILLARY: 207 mg/dL — AB (ref 65–99)
GLUCOSE-CAPILLARY: 220 mg/dL — AB (ref 65–99)
Glucose-Capillary: 146 mg/dL — ABNORMAL HIGH (ref 65–99)
Glucose-Capillary: 167 mg/dL — ABNORMAL HIGH (ref 65–99)
Glucose-Capillary: 168 mg/dL — ABNORMAL HIGH (ref 65–99)
Glucose-Capillary: 200 mg/dL — ABNORMAL HIGH (ref 65–99)

## 2016-07-10 LAB — PROTIME-INR
INR: 1.1
Prothrombin Time: 14.3 seconds (ref 11.4–15.2)

## 2016-07-10 LAB — CREATININE, SERUM
CREATININE: 5.07 mg/dL — AB (ref 0.44–1.00)
GFR calc Af Amer: 9 mL/min — ABNORMAL LOW (ref >60)
GFR calc non Af Amer: 8 mL/min — ABNORMAL LOW (ref >60)

## 2016-07-10 LAB — HEPARIN LEVEL (UNFRACTIONATED): Heparin Unfractionated: 0.36 [IU]/mL (ref 0.30–0.70)

## 2016-07-10 LAB — POCT ACTIVATED CLOTTING TIME: Activated Clotting Time: 142 seconds

## 2016-07-10 SURGERY — LEFT HEART CATH AND CORONARY ANGIOGRAPHY

## 2016-07-10 MED ORDER — SODIUM CHLORIDE 0.9 % IV SOLN: 250.0000 mL | INTRAVENOUS | Status: DC | PRN

## 2016-07-10 MED ORDER — HEPARIN SODIUM (PORCINE) 5000 UNIT/ML IJ SOLN
5000.0000 [IU] | Freq: Three times a day (TID) | INTRAMUSCULAR | Status: DC
  Administered 2016-07-10 – 2016-07-11 (×2): 5000 [IU] via SUBCUTANEOUS
  Filled 2016-07-10 (×2): qty 1

## 2016-07-10 MED ORDER — HEPARIN (PORCINE) IN NACL 2-0.9 UNIT/ML-% IJ SOLN
INTRAMUSCULAR | Status: AC
Start: 2016-07-10 — End: 2016-07-10
  Filled 2016-07-10: qty 1000

## 2016-07-10 MED ORDER — FENTANYL CITRATE (PF) 100 MCG/2ML IJ SOLN
INTRAMUSCULAR | Status: DC | PRN
  Administered 2016-07-10: 50 ug via INTRAVENOUS

## 2016-07-10 MED ORDER — SODIUM CHLORIDE 0.9% FLUSH: 3.0000 mL | Freq: Two times a day (BID) | INTRAVENOUS | Status: DC

## 2016-07-10 MED ORDER — LIDOCAINE HCL (PF) 1 % IJ SOLN
INTRAMUSCULAR | Status: DC | PRN
  Administered 2016-07-10: 20 mL

## 2016-07-10 MED ORDER — LABETALOL HCL 200 MG PO TABS
200.0000 mg | ORAL_TABLET | Freq: Two times a day (BID) | ORAL | Status: DC
Start: 2016-07-10 — End: 2016-07-11
  Administered 2016-07-10 – 2016-07-11 (×2): 200 mg via ORAL
  Filled 2016-07-10 (×2): qty 1

## 2016-07-10 MED ORDER — FENTANYL CITRATE (PF) 100 MCG/2ML IJ SOLN
INTRAMUSCULAR | Status: AC
  Filled 2016-07-10: qty 2

## 2016-07-10 MED ORDER — MIDAZOLAM HCL 2 MG/2ML IJ SOLN
INTRAMUSCULAR | Status: AC
  Filled 2016-07-10: qty 2

## 2016-07-10 MED ORDER — IOPAMIDOL (ISOVUE-370) INJECTION 76%
INTRAVENOUS | Status: DC | PRN
  Administered 2016-07-10: 60 mL via INTRA_ARTERIAL

## 2016-07-10 MED ORDER — MIDAZOLAM HCL 2 MG/2ML IJ SOLN
INTRAMUSCULAR | Status: DC | PRN
  Administered 2016-07-10: 1 mg via INTRAVENOUS

## 2016-07-10 MED ORDER — IOPAMIDOL (ISOVUE-370) INJECTION 76%
INTRAVENOUS | Status: AC
  Filled 2016-07-10: qty 100

## 2016-07-10 MED ORDER — LIDOCAINE HCL (PF) 1 % IJ SOLN
INTRAMUSCULAR | Status: AC
  Filled 2016-07-10: qty 30

## 2016-07-10 MED ORDER — HEPARIN (PORCINE) IN NACL 2-0.9 UNIT/ML-% IJ SOLN
INTRAMUSCULAR | Status: DC | PRN
  Administered 2016-07-10: 1000 mL

## 2016-07-10 MED ORDER — SODIUM CHLORIDE 0.9% FLUSH: 3.0000 mL | INTRAVENOUS | Status: DC | PRN

## 2016-07-10 MED ORDER — HYDRALAZINE HCL 20 MG/ML IJ SOLN: 10.0000 mg | Freq: Once | INTRAMUSCULAR | Status: AC

## 2016-07-10 SURGICAL SUPPLY — 10 items
CATH INFINITI 5FR ANG PIGTAIL (CATHETERS) ×2 IMPLANT
CATH INFINITI 5FR JL4 (CATHETERS) ×2 IMPLANT
CATH SITESEER 5F MULTI A 2 (CATHETERS) ×2 IMPLANT
KIT HEART LEFT (KITS) ×2 IMPLANT
PACK CARDIAC CATHETERIZATION (CUSTOM PROCEDURE TRAY) ×2 IMPLANT
SET INTRODUCER MICROPUNCT 5F (INTRODUCER) ×2 IMPLANT
SHEATH PINNACLE 5F 10CM (SHEATH) ×2 IMPLANT
TRANSDUCER W/STOPCOCK (MISCELLANEOUS) ×2 IMPLANT
TUBING CIL FLEX 10 FLL-RA (TUBING) ×2 IMPLANT
WIRE EMERALD 3MM-J .035X150CM (WIRE) ×2 IMPLANT

## 2016-07-10 NOTE — Research (Signed)
LEADERS FREE II Informed Consent   Subject Name: Colleen Mullins  Subject met inclusion and exclusion criteria.  The informed consent form, study requirements and expectations were reviewed with the subject and questions and concerns were addressed prior to the signing of the consent form.  The subject verbalized understanding of the trial requirements.  The subject agreed to participate in the LEADERS FREE II trial and signed the informed consent.  The informed consent was obtained prior to performance of any protocol-specific procedures for the subject.  A copy of the signed informed consent was given to the subject and a copy was placed in the subject's medical record. If patient meets angiographic criteria she would be a potential LEADERS FREE II candidate.  [name] 07/10/2016, 8:01 AM

## 2016-07-10 NOTE — Progress Notes (Signed)
ANTICOAGULATION CONSULT NOTE   Pharmacy Consult for heparin Indication: chest pain/ACS  Allergies  Allergen Reactions  . Lactose Intolerance (Gi) Other (See Comments)    Abdominal pains and bloating    Patient Measurements: Height: 5\' 6"  (167.6 cm) Weight: 160 lb 8 oz (72.8 kg) IBW/kg (Calculated) : 59.3 Heparin Dosing Weight: 74.5kg  Vital Signs: Temp: 98.4 F (36.9 C) (08/16 0539) Temp Source: Oral (08/16 0539) BP: 147/64 (08/16 0539) Pulse Rate: 75 (08/16 0539)  Labs:  Recent Labs  07/07/16 1428 07/07/16 2009 07/08/16 0123 07/09/16 1330 07/09/16 1911 07/09/16 2310 07/10/16 0522  HGB 10.1*  --   --   --  11.1*  --  10.8*  HCT 33.2*  --   --   --  34.7*  --  34.8*  PLT 319  --   --   --  431*  --  430*  LABPROT  --   --   --   --   --   --  14.3  INR  --   --   --   --   --   --  1.10  HEPARINUNFRC  --   --   --   --   --  0.49 0.36  CREATININE 5.05*  --   --  7.84*  --   --  4.31*  TROPONINI 0.22* 0.23* 0.23*  --   --   --   --     Estimated Creatinine Clearance: 12.2 mL/min (by C-G formula based on SCr of 4.31 mg/dL).   Medical History: Past Medical History:  Diagnosis Date  . Anemia    low iron  . Arthritis   . Diabetes mellitus    type 2  . Diabetic retinopathy (HCC)   . Dialysis patient [facility](HCC)    M-W-F @ [facility]  . Ejection fraction   . ESRD on hemodialysis [facility](HCC)    Dialysis T/Th/Sa  . GERD (gastroesophageal reflux disease)   . Glaucoma   . H/O: Bell's palsy 2007  . Hyperlipidemia   . Hypertension    a. labile - hx of BP dropping at HD.  . Macular degeneration   . Shortness of breath dyspnea    with walking    Assessment: [age] YOF with hx of ESRD on HD TTS presented with chest pain and positive troponin. neuclear stress test abnormal, plan for LHC 8/16.   Goal of Therapy:  INR 2-3 Monitor platelets by anticoagulation protocol: Yes   Plan:  - Continue Heparin infusion at 900 units/hr - Daily heparin level and CBC - F/u plans for  cath  Pollyann SamplesAndy Gayl Ivanoff, PharmD, BCPS 07/10/2016, 8:04 AM Pager: [phone]

## 2016-07-10 NOTE — Progress Notes (Signed)
ANTICOAGULATION CONSULT NOTE - Follow Up Consult  Pharmacy Consult for Heparin Indication: chest pain/ACS  Allergies  Allergen Reactions  . Lactose Intolerance (Gi) Other (See Comments)    Abdominal pains and bloating    Patient Measurements: Height: 5\' 6"  (167.6 cm) Weight: 162 lb 14.7 oz (73.9 kg) IBW/kg (Calculated) : 59.3  Vital Signs: Temp: 98.4 F (36.9 C) (08/15 2318) Temp Source: Oral (08/15 2318) BP: 150/73 (08/15 2318) Pulse Rate: 78 (08/15 2318)  Labs:  Recent Labs  07/07/16 1220 07/07/16 1428 07/07/16 2009 07/08/16 0123 07/09/16 1330 07/09/16 1911 07/09/16 2310  HGB 10.3* 10.1*  --   --   --  11.1*  --   HCT 33.3* 33.2*  --   --   --  34.7*  --   PLT 313 319  --   --   --  431*  --   HEPARINUNFRC  --   --   --   --   --   --  0.49  CREATININE 4.83* 5.05*  --   --  7.84*  --   --   TROPONINI  --  0.22* 0.23* 0.23*  --   --   --     Estimated Creatinine Clearance: 6.8 mL/min (by C-G formula based on SCr of 7.84 mg/dL).   Assessment: NST with high risk for ischemic disease, cath in AM, initial HL is therapeutic  Goal of Therapy:  Heparin level 0.3-0.7 units/ml Monitor platelets by anticoagulation protocol: Yes   Plan:  Cont heparin at 900 units/hr Confirmatory HL with AM labs  Abran DukeLedford, Savahna Casados 07/10/2016,12:01 AM

## 2016-07-10 NOTE — Interval H&P Note (Signed)
Cath Lab Visit (complete for each Cath Lab visit)  Clinical Evaluation Leading to the Procedure:   ACS: No.  Non-ACS:    Anginal Classification: CCS III  Anti-ischemic medical therapy: Maximal Therapy (2 or more classes of medications)  Non-Invasive Test Results: High-risk stress test findings: cardiac mortality >3%/year  Prior CABG: No previous CABG      History and Physical Interval Note:  07/10/2016 7:37 AM  Colleen Mullins  has presented today for surgery, with the diagnosis of cp  The various methods of treatment have been discussed with the patient and family. After consideration of risks, benefits and other options for treatment, the patient has consented to  Procedure(s): Left Heart Cath and Coronary Angiography (N/A) as a surgical intervention .  The patient's history has been reviewed, patient examined, no change in status, stable for surgery.  I have reviewed the patient's chart and labs.  Questions were answered to the patient's satisfaction.     Lyn RecordsHenry W Smith III

## 2016-07-10 NOTE — Progress Notes (Signed)
SUBJECTIVE: Patient has returned from L heart catheterization. In no acute distress. Patient continues to complain of chest discomfort. Patient denies any pain, bleeding, or complications from the R femoral access site   BP 125/65   Pulse 75   Temp 98.4 F (36.9 C) (Oral)   Resp 15   Ht 5\' 6"  (1.676 m)   Wt 72.8 kg (160 lb 8 oz)   SpO2 100%   BMI 25.91 kg/m   Intake/Output Summary (Last 24 hours) at 07/10/16 1031 Last data filed at 07/10/16 0800  Gross per 24 hour  Intake           506.25 ml  Output             5000 ml  Net         -4493.75 ml    PHYSICAL EXAM General appearance: alert, cooperative and no distress Lungs: clear to auscultation bilaterally Heart: regular rate and rhythm and S1, S2 normal Abdomen: soft, non-tender, non-distended, normoactive bowel sounds Extremities: extremities normal, atraumatic, no cyanosis or edema Skin: Skin color, texture, turgor normal. No rashes or lesions   LABS/IMAGING: Basic metabolic panel     Status: Abnormal   Collection Time: 07/10/16  5:22 AM  Result Value Ref Range   Sodium 134 (L) 135 - 145 mmol/L   Potassium 4.5 3.5 - 5.1 mmol/L   Chloride 93 (L) 101 - 111 mmol/L   CO2 28 22 - 32 mmol/L   Glucose, Bld 108 (H) 65 - 99 mg/dL   BUN 26 (H) 6 - 20 mg/dL   Creatinine, Ser 1.614.31 (H) 0.44 - 1.00 mg/dL    Comment: DELTA CHECK NOTED   Calcium 9.4 8.9 - 10.3 mg/dL     GFR calc Af Amer 11 (L) >60 mL/min   Anion gap 13 5 - 15   CBC     Status: Abnormal   Collection Time: 07/10/16  5:22 AM  Result Value Ref Range   WBC 9.6 4.0 - 10.5 K/uL   RBC 4.09 3.87 - 5.11 MIL/uL   Hemoglobin 10.8 (L) 12.0 - 15.0 g/dL   HCT 09.634.8 (L) 04.536.0 - 40.946.0 %   MCV 85.1 78.0 - 100.0 fL   MCH 26.4 26.0 - 34.0 pg   MCHC 31.0 30.0 - 36.0 g/dL   RDW 81.117.7 (H) 91.411.5 - 78.215.5 %   Platelets 430 (H) 150 - 400 K/uL   Protime-INR     Status: None  Collection Time: 07/10/16  5:22 AM  Result Value Ref Range  Prothrombin Time 14.3 11.4 - 15.2 seconds  INR  1.10    Nm Myocar Multi W/spect W/wall Motion / Ef  07/09/2016 IMPRESSION: 1. Large region of reversible ischemia within the anterior and anterior lateral wall. 2. Global hypokinesia. 3. Left ventricular ejection fraction 36% 4. Non invasive risk stratification: High  Transthoracic Echocardiography 07/09/2016 There was severe concentric hypertrophy. Systolic function was vigorous. The estimated ejection fraction was in the range of 65% to 70%. Wall motion was normal; there were no regional wall motion abnormalities. Features are consistent with a pseudonormal left ventricular filling pattern, with concomitant abnormal relaxation and increased filling pressure (grade 2 diastolic dysfunction). Compared to the prior study, there has been no significant interval change.  Left Heart Cath and Coronary Angiography 07/10/16 1.  Mild to moderate, non-obstructive coronary artery disease.  No angiographic findings to explain patient's atypical chest pain or myocardial perfusion stress test findings. 2.  Normal left ventricular filling pressure  Current Meds: .  acetaminophen  650 mg Oral Once  . amLODipine  10 mg Oral QHS  . aspirin EC  81 mg Oral QHS  . calcium acetate  667 mg Oral TID WC  . fenofibrate  160 mg Oral Daily  . heparin  5,000 Units Subcutaneous Q8H  . hydrALAZINE  10 mg Intravenous Once  . HYDROmorphone  1 mg Oral Once  . insulin aspart  0-9 Units Subcutaneous TID WC  . insulin aspart  3 Units Subcutaneous TID WC  . labetalol  100 mg Oral BID  . latanoprost  1 drop Both Eyes QHS  . losartan  100 mg Oral QPM  . multivitamin  1 tablet Oral QPM  . omega-3 acid ethyl esters  1 g Oral Daily  . pantoprazole  40 mg Oral Daily  . sodium chloride flush  3 mL Intravenous Q12H  . sodium chloride flush  3 mL Intravenous Q12H     ASSESSMENT AND PLAN: Colleen Mullins is a 70 y.o. femalewith a past medical history significant for ESRD on HD, DM2, HTN who presented with sharp non-radiating substernal  chest pain that was relieved with nitroglycerine, but subsided by our initial evaluation. Patient continues to complain of chest discomfort, which she has experienced intermittently since she had R internal jugular catheter placed on 03/27/2016.   Active Problems:   Type 2 diabetes mellitus with renal complication (HCC)   End stage renal disease (HCC)   Elevated troponin   ESRD on dialysis (HCC)   Benign essential HTN   Hyperlipidemia   Pain in the chest   Anemia, unspecified   Chest pain   Abnormal nuclear stress test   1. Chest Pain Patient continues to have chest discomfort. The patient has no pain, warmth, or erythema around IJV line site, patient is afebrile and WBC is 7.0. EKG did not demonstrate STEMI, troponin trended 0.22>0.23>0.23. Patient troponin trended during admission on 04/14/2016 and was between 0.21-0.25.CXR showed enlargement of cardiac silhouette with pulmonary vascular congestion, with no other signs of cardiopulmonary processes. ECHO 07/09/2016 showed severe LV concentric hypertrophy. Systolic function was vigorous. The estimated ejection fraction was in the range of 65% to 70%, with grade 2 diastolic dysfunction. Nuclear stress test showed large region of reversible ischemia within the anterior and anterior lateral wall. L heart catheterization showed moderate non obstructive ASCAD with nothing to account for patient's SOB or atypical chest pain.  Patient is afebrile and hemodynamically stable. H/H is 10.1/33.2, which are at her baseline. There are no symptoms of GI bleed, and lipase was 48. She had previously taken Nexium for GERD, but is no longer taking it. BMP is notable for Cr of 4.83 and Ca 8.1, patient with normal potassium of 4.2.  Also on differential is GERD and MSK pain.  - Continue Protonix 40 mg daily  - ContinueAspirin 81 mg daily - Continue home amlodipine 10 mg daily - Continue home losartan 100 mg daily - Increase labetalol dose to 200 mg bid, decrease ARB  or amlodipine dose if BP gets too low per cards rec. - f/u with [facility] regarding pain from HD access site  2. Hypertension: Patient takes amlodipine 10 mg, losartan 100 mg, and labetalol 450 mg bid at home, holding labetalol at admission admission. BP 154/59 on presentation, latest 125/65.  - Patient with history of hypertension. Takes amlodipine 10 mg, losartan 100 mg and labetalol 450 mg 2 times daily. Blood pressure at time of presentation 143/83. - Continue home amlodipine,  losartan, increase labetalol dose as above  3. ESRD - Continue dialysis Tu/Th/Sa  4. Diabetes Mellitus Type II: glucose was 297 on presentation. Patient uses insulin aspart at home 0-9 units 3 times daily with meals - Sliding scale insulin - renal/carb modified diet - obtain HbA1c  5. Joint pain: patient reports long-standing joint pain in hands, R knee > L knee, and left shoulder. Denies having rheumatological workup. - Acetaminophen 650 mg PRN - referral to outpatient orthopedic surgeon for evaluation  6.  DVT/PE prophylaxis - heparin 5,000 U q8h  Dispo: Anticipated discharge in approximately 1day.  Signed Will Raidyn Breiner MS4 8/16/201710:31 AM

## 2016-07-10 NOTE — Progress Notes (Signed)
Subjective: Patient had CATH this morning with cardiology without any complications. She states that she is still having mild chest pain. Overall she is improving. She has no additional acute complaints or concerns this morning.  Objective:  Vital signs in last 24 hours: Vitals:   07/10/16 0945 07/10/16 0950 07/10/16 0955 07/10/16 1023  BP: (!) 185/67 (!) 172/69 (!) 160/75 125/65  Pulse: 69 67 82 75  Resp: 18 12 17 15   Temp:      TempSrc:      SpO2: 97% 100% 100% 100%  Weight:      Height:       Physical Exam  Constitutional: She is oriented to person, place, and time. She appears well-developed and well-nourished.  In no acute distress  HENT:  Head: Normocephalic and atraumatic.  EJ in place  Cardiovascular: Normal rate and regular rhythm.  Exam reveals no gallop and no friction rub.   No murmur heard. Respiratory: Effort normal and breath sounds normal. No respiratory distress. She has no wheezes.  GI: Soft. Bowel sounds are normal. She exhibits no distension. There is no tenderness.  Musculoskeletal: She exhibits no edema.  Neurological: She is alert and oriented to person, place, and time.     Assessment/Plan:  [name] a 70 year old female with history of hypertension, diabetes, gastroesophageal reflux disease and end-stage renal disease on dialysis T, T, S who presented with a one-day history of substernal chest pain relieved with nitroglycerin, EKG without acute ST segment changes and a positive troponin.  In brief, patient had nuclear stress test yesterday which showed areas of reversible ischemia. She had a left heart catheterization which was normal without evidence to explain the patient's chest pain. She will have outpatient follow-up with cardiology and we will increase her labetalol to 200 mg twice daily. We will monitor her today on her new antihypertensive regimen and will plan to discharge the patient tomorrow. For detailed assessment and plan please see  below.  1. Chest Pain -- Patient presents with substernal chest pain relieved with nitroglycerin. Patient hemodynamically stable with EKG not demonstrating STEMI. Echocardiogram on 10/18/2015 with normal ejection fraction of 60-65%.Patient with multiple cardiac risk factors including hypertension, hyperlipidemia, type 2 diabetes and end-stage renal disease. Differential diagnosis includes NSTEMI, demand ischemia 2/2 hypertensive emergency,gastroesophageal reflux disease, MSK pain, peptic ulcer disease or other cardiac pathology. -- EKG without evidence of acute infarct --  Patient had left heart catheterization which did not show any etiology for the patient's chest pain or abnormal nuclear study. -- Follow-up with cardiology -- Acetaminophen 650 mg as needed for pain -- Aspirin 81 mg daily --Start Protonix 40 mg once daily for gastroesophageal reflux disease  2. Hypertension -- Patient with history of hypertension. Takes amlodipine 10 mg, losartan 100 mg and labetalol 450 mg 2 times daily. Blood pressure at time of presentation 143/83. -- Continue amlodipine 10 mg once daily and losartan 100 mg once daily -- Increase labetalol to 200 mg twice a day -- Patient had dialysis 07/09/2016 -- If patient becomes hypotensive during dialysis consider decreasing her amlodipine dose. It will be important for her to continue her labetalol dose given her left ventricular hypertrophy  3. ESRD -- Patient with end-stage renal disease on hemodialysis T, T, S.  -- Continue current dialysis regimen -- Completed dialysis on 07/09/2016  4. Diabetes Mellitus Type II -- Patient with history of diabetes. Glucose at time of presentation to 297. Patient uses insulin aspart at home 0-9 units 3 times daily  with meals -- Sliding scale insulin -- Modified carbohydrate diet -- Hemoglobin A1c 6.4  5. DVT/PE prophylaxis -- Heparin injection 5000 units every 8 hours  Dispo: Anticipated discharge in approximately 1  day(s).   Thomasene LotJames Tia Hieronymus, MD 07/10/2016, 11:04 AM Pager: [phone]

## 2016-07-10 NOTE — Progress Notes (Signed)
Site area: rt groin Site Prior to Removal:  Level 0 Pressure Applied For:  20 minutes Manual:   yes Patient Status During Pull:  stable Post Pull Site:  Level  0 Post Pull Instructions Given:  yes Post Pull Pulses Present: yes Dressing Applied:  tegaderm Bedrest begins @ 1000 Comments:   

## 2016-07-10 NOTE — H&P (View-Only) (Signed)
Nuclear stress test is noted be abnormal with large region of reversible ischemia within the anterior and anterior lateral wall, EF 36%. 2D echo is pending. Per prior discussion with Dr. Mayford Knifeurner, will need LHC to further evaluate. I spoke with the patient while she was up in HD. Risks and benefits of cardiac catheterization have been discussed with the patient. These include bleeding, infection, stroke, heart attack, death. (Kidney damage not applicable.) The patient understands these risks and is willing to proceed. Will plan for tomorrow morning. Per discussion with Dr. Mayford Knifeurner, will also change heparin to per pharmacy dosing given + trop and high risk nuc. Gittel Mccamish PA-C

## 2016-07-10 NOTE — Progress Notes (Signed)
Patient states that she feels very short of breath when walking. Patient denies shortness of breath at rest, but states that she gets very winded when walking the short distance from her bathroom to her kitchen at home. Patient feels that she needs home oxygen.  Patient's oxygen saturation was 100% on room air while patient was in bed. Patient ambulated a distance of 300 feet on room air and her oxygen saturation was observed to briefly drop as low as 86%, recovering to 94% when patient stood still to rest. Patient was dyspneic during ambulation with visibly increased work of breathing. Upon returning to room, patient's oxygen saturation returned to 100% after a rest period of about 2 minutes.

## 2016-07-10 NOTE — Progress Notes (Signed)
Cath reviewed with Dr. Mayford Knifeurner - mild-mod CAD, nothing to explain patient's symptoms or her abnormal nuc. She had normal filling pressures so is well compensated from CHF standpoint. 2D echo significant for severe LVH so BP control and volume management with HD will be the treatment for that. Will arrange f/u in our office. No further workup planned as inpatient. Has f/u with Dr. Mayford Knifeurner 08/12/16 at 11:15 - this will print out automatically in the patient's AVS with the new Epic upgrade.  Post-cath instructions include "No driving for 2 days. No lifting over 5 lbs for 1 week. No sexual activity for 1 week. Keep procedure site clean & dry. If you notice increased pain, swelling, bleeding or pus, call/return!  You may shower, but no soaking baths/hot tubs/pools for 1 week. " Added to DC instructions.  Will s/o, please call with questions.  Dayna Dunn PA-C

## 2016-07-11 DIAGNOSIS — R0789 Other chest pain: Secondary | ICD-10-CM | POA: Diagnosis not present

## 2016-07-11 LAB — RENAL FUNCTION PANEL
Albumin: 3.1 g/dL — ABNORMAL LOW (ref 3.5–5.0)
Albumin: 3.3 g/dL — ABNORMAL LOW (ref 3.5–5.0)
Anion gap: 14 (ref 5–15)
Anion gap: 10 (ref 5–15)
BUN: 51 mg/dL — ABNORMAL HIGH (ref 6–20)
BUN: 11 mg/dL (ref 6–20)
CO2: 25 mmol/L (ref 22–32)
CO2: 27 mmol/L (ref 22–32)
Calcium: 8.7 mg/dL — ABNORMAL LOW (ref 8.9–10.3)
Calcium: 8.8 mg/dL — ABNORMAL LOW (ref 8.9–10.3)
Chloride: 91 mmol/L — ABNORMAL LOW (ref 101–111)
Chloride: 94 mmol/L — ABNORMAL LOW (ref 101–111)
Creatinine, Ser: 6.21 mg/dL — ABNORMAL HIGH (ref 0.44–1.00)
Creatinine, Ser: 2.54 mg/dL — ABNORMAL HIGH (ref 0.44–1.00)
GFR calc Af Amer: 7 mL/min — ABNORMAL LOW (ref >60)
GFR calc Af Amer: 21 mL/min — ABNORMAL LOW (ref >60)
GFR calc non Af Amer: 6 mL/min — ABNORMAL LOW (ref >60)
GFR calc non Af Amer: 18 mL/min — ABNORMAL LOW (ref >60)
Glucose, Bld: 140 mg/dL — ABNORMAL HIGH (ref 65–99)
Glucose, Bld: 193 mg/dL — ABNORMAL HIGH (ref 65–99)
Phosphorus: 5.3 mg/dL — ABNORMAL HIGH (ref 2.5–4.6)
Phosphorus: 2.2 mg/dL — ABNORMAL LOW (ref 2.5–4.6)
Potassium: 4.6 mmol/L (ref 3.5–5.1)
Potassium: 3.7 mmol/L (ref 3.5–5.1)
Sodium: 130 mmol/L — ABNORMAL LOW (ref 135–145)
Sodium: 131 mmol/L — ABNORMAL LOW (ref 135–145)

## 2016-07-11 LAB — CBC
HCT: 34.8 % — ABNORMAL LOW (ref 36.0–46.0)
HEMATOCRIT: 30.6 % — AB (ref 36.0–46.0)
Hemoglobin: 9.6 g/dL — ABNORMAL LOW (ref 12.0–15.0)
Hemoglobin: 11 g/dL — ABNORMAL LOW (ref 12.0–15.0)
MCH: 27.1 pg (ref 26.0–34.0)
MCH: 27 pg (ref 26.0–34.0)
MCHC: 31.4 g/dL (ref 30.0–36.0)
MCHC: 31.6 g/dL (ref 30.0–36.0)
MCV: 86.4 fL (ref 78.0–100.0)
MCV: 85.5 fL (ref 78.0–100.0)
PLATELETS: 392 10*3/uL (ref 150–400)
Platelets: 409 10*3/uL — ABNORMAL HIGH (ref 150–400)
RBC: 3.54 MIL/uL — ABNORMAL LOW (ref 3.87–5.11)
RBC: 4.07 MIL/uL (ref 3.87–5.11)
RDW: 18.2 % — AB (ref 11.5–15.5)
RDW: 18.2 % — ABNORMAL HIGH (ref 11.5–15.5)
WBC: 7.7 10*3/uL (ref 4.0–10.5)
WBC: 8.4 10*3/uL (ref 4.0–10.5)

## 2016-07-11 LAB — GLUCOSE, CAPILLARY: GLUCOSE-CAPILLARY: 197 mg/dL — AB (ref 65–99)

## 2016-07-11 MED ORDER — ALTEPLASE 2 MG IJ SOLR: 2.0000 mg | Freq: Once | INTRAMUSCULAR | Status: DC | PRN

## 2016-07-11 MED ORDER — HEPARIN SODIUM (PORCINE) 1000 UNIT/ML DIALYSIS
1000.0000 [IU] | INTRAMUSCULAR | Status: DC | PRN
  Filled 2016-07-11: qty 1

## 2016-07-11 MED ORDER — HEPARIN SODIUM (PORCINE) 1000 UNIT/ML DIALYSIS
20.0000 [IU]/kg | INTRAMUSCULAR | Status: DC | PRN
  Filled 2016-07-11: qty 2

## 2016-07-11 MED ORDER — LABETALOL HCL 200 MG PO TABS: 200.0000 mg | ORAL_TABLET | Freq: Two times a day (BID) | ORAL | 3 refills | Status: DC

## 2016-07-11 MED ORDER — SODIUM CHLORIDE 0.9 % IV SOLN: 100.0000 mL | INTRAVENOUS | Status: DC | PRN

## 2016-07-11 MED ORDER — LIDOCAINE-PRILOCAINE 2.5-2.5 % EX CREA
1.0000 "application " | TOPICAL_CREAM | CUTANEOUS | Status: DC | PRN
  Filled 2016-07-11: qty 5

## 2016-07-11 MED ORDER — DIPHENHYDRAMINE HCL 25 MG PO CAPS
ORAL_CAPSULE | ORAL | Status: AC
  Filled 2016-07-11: qty 1

## 2016-07-11 MED ORDER — DICLOFENAC SODIUM 1 % TD GEL
2.0000 g | Freq: Four times a day (QID) | TRANSDERMAL | Status: DC
  Filled 2016-07-11: qty 100

## 2016-07-11 MED ORDER — OXYCODONE-ACETAMINOPHEN 5-325 MG PO TABS
ORAL_TABLET | ORAL | Status: AC
  Filled 2016-07-11: qty 1

## 2016-07-11 MED ORDER — DICLOFENAC SODIUM 1 % TD GEL: 2.0000 g | Freq: Four times a day (QID) | TRANSDERMAL | 0 refills | Status: DC

## 2016-07-11 MED ORDER — LIDOCAINE HCL (PF) 1 % IJ SOLN: 5.0000 mL | INTRAMUSCULAR | Status: DC | PRN

## 2016-07-11 MED ORDER — PENTAFLUOROPROP-TETRAFLUOROETH EX AERO: 1.0000 "application " | INHALATION_SPRAY | CUTANEOUS | Status: DC | PRN

## 2016-07-11 NOTE — Discharge Summary (Signed)
Name: Colleen Mullins MRN: [id number] DOB: 04-Sep-1946 70 y.o. PCP: Zoila Shutter, MD  Date of Admission: 07/07/2016 11:02 AM Date of Discharge: 07/11/2016 Attending Physician: Earl Lagos, MD  Discharge Diagnosis: 1. Atypical chest pain  Discharge Medications:   Medication List    STOP taking these medications   CORICIDIN 2-325 MG Tabs Generic drug:  Chlorpheniramine-APAP   hydrALAZINE 25 MG tablet Commonly known as:  APRESOLINE     TAKE these medications   acetaminophen 325 MG tablet Commonly known as:  TYLENOL Take 650 mg by mouth every 6 (six) hours as needed for moderate pain.   ACID REDUCER PO Take 1 tablet by mouth as needed (for acid reflux).   amLODipine 10 MG tablet Commonly known as:  NORVASC Take 10 mg by mouth at bedtime.   aspirin EC 81 MG tablet Take 81 mg by mouth at bedtime.   aspirin-acetaminophen-caffeine 250-250-65 MG tablet Commonly known as:  EXCEDRIN MIGRAINE Take 2 tablets by mouth every 6 (six) hours as needed for headache.   bisacodyl 5 MG EC tablet Commonly known as:  DULCOLAX Take 5 mg by mouth daily as needed for moderate constipation.   calcium acetate 667 MG capsule Commonly known as:  PHOSLO Take 1,334 mg by mouth 3 (three) times daily with meals. Takes 2 caps with meals and 1 cap with snacks   cinacalcet 60 MG tablet Commonly known as:  SENSIPAR Take 60 mg by mouth at bedtime.   CLEAR EYES OP Place 1 drop into both eyes as needed (for dry eyes).   diclofenac sodium 1 % Gel Commonly known as:  VOLTAREN Apply 2 g topically 4 (four) times daily.   fenofibrate 145 MG tablet Commonly known as:  TRICOR Take 145 mg by mouth every evening.   fish oil-omega-3 fatty acids 1000 MG capsule Take 1 g by mouth at bedtime.   insulin aspart 100 UNIT/ML injection Commonly known as:  novoLOG Inject 0-9 Units into the skin 3 (three) times daily with meals.   KAOPECTATE 262 MG/15ML suspension Generic drug:  bismuth  subsalicylate Take 30 mLs by mouth every 6 (six) hours as needed for indigestion or diarrhea or loose stools.   labetalol 200 MG tablet Commonly known as:  NORMODYNE Take 1 tablet (200 mg total) by mouth 2 (two) times daily. What changed:  medication strength  how much to take  additional instructions   losartan 100 MG tablet Commonly known as:  COZAAR Take 100 mg by mouth every evening.   multivitamin Tabs tablet Take 1 tablet by mouth every evening.   oxyCODONE-acetaminophen 5-325 MG tablet Commonly known as:  PERCOCET/ROXICET Take 1 tablet by mouth every 4 (four) hours as needed for severe pain.   Travoprost (BAK Free) 0.004 % Soln ophthalmic solution Commonly known as:  TRAVATAN Place 1 drop into both eyes at bedtime.       Disposition and follow-up:   Ms.Colleen Mullins was discharged from [facility] in Good condition.  At the hospital follow up visit please address: ensure you follow up with cardiology  on 08/12/16  1.  Please ensure you follow up with cardiology on 08/12/16  2.  Labs / imaging needed at time of follow-up: None  3.  Pending labs/ test needing follow-up: None  Follow-up Appointments: 1. Cardiology on 08/12/16 2. Please schedule an appointment with your primary care physician  Hospital Course by problem list: 1. Atypical chest pain Mrs. Colleen Mullins presented to the [facility] emergency department on  07/07/2016 complaining of substernal chest pain that did not radiate to the arm, neck, back or abdomen. She said the pain was 10/10 and this caused her to call 911 which brought her to the emergency department. Upon arrival to the emergency department the patient was hemodynamically stable and afebrile. She endorsed chest pain but stated that this had improved to 3/10. Her blood pressure in the emergency department was elevated at 154/59. EKG did not show evidence of acute ST segment changes with the patient had a positive troponin of 0.22. Basic  metabolic panel was significant for an elevated creatinine and hyperglycemia of 297. By the time the patient was admitted to the inpatient facility she was noted complaining of chest pain. Due to the patient's many risk factors we consulted cardiology and had an ischemic evaluation completed for the patient. The patient had a stress test which demonstrated areas of reversible ischemia in the anterior and anterior lateral aspect of the heart. Following this, the patient was further evaluated with a left heart cath which did not show evidence of occlusion and could not explain the patient's findings on stress study. Cardiology cleared the patient from a cardiac standpoint inmate small adjustments to her hypertension regimen. She'll be discharged on amlodipine 10 mg once daily, losartan 100 mg once daily and labetalol 200 mg twice daily. At the time of discharge the patient was hemodynamically stable and stating that her pain greatly improved. Importantly, throughout the entirety of her hospital stay the quality and location of her pain changed by provider. She seemed to be most affected by extremely light palpation to her sternum. At the time of discharge her pain had improved and she was ready to go home.  2. ESRD on dialysis T, T, S Patient has end-stage renal disease most likely secondary to poorly controlled hypertension and diabetes mellitus and she receives dialysis on Tuesday, Thursday Saturday schedule. She was able to stay on her current dialysis schedule [facility] inpatient [facility] . At time of discharge her most recent dialysis session was 07/11/2016. Upon discharge the patient was hemodynamically stable, afebrile and appeared euvolemic on examination. She should continue with her normally scheduled dialysis appointments in the outpatient setting.  3. Hypertension  Patient with a long standing history of extremely liable hypertension. At time of admission patient was on amlodipine 10 mg,  losartan 100 mg and labetalol 450 mg twice daily. However, given recent hypotensive episodes during dialysis causing the patient to stop before receiving a full treatment we scaled back the dose of her labetalol to 200 mg twice daily. Once the patient was started on this regimen her blood pressures were more appropriately treated. She will need to be continue on amlodipine 10 mg once daily, losartan 100 mg once daily and labetalol 200 mg twice daily. Please follow up with the patient regarding her hypertension and her blood pressure medication regimen as appropriate in the outpatient setting.  4. Diabetes mellitus type 2 Patient has history of poorly controlled diabetes. Glucose at time of presentation was 297. During her inpatient stay she was treated with sliding scale insulin and a low carb diet. She is to resume her home insulin regimen as previously prescribed.   Discharge Vitals:   BP 125/68 (BP Location: Right Arm)   Pulse 83   Temp 98.3 F (36.8 C) (Oral)   Resp 17   Ht 5\' 6"  (1.676 m)   Wt 167 lb 15.9 oz (76.2 kg)   SpO2 100%   BMI 27.11  kg/m   Pertinent Labs, Studies, and Procedures:  1. Cardiac stress test demonstrating evidence of reversible ischemia in the anterior and lateral anterior portion of the heart. 2. Follow-up left heart catheterization without evidence of occlusive disease and nothing to suggest the etiology of the patient's cardiac stress test  Discharge Instructions: Discharge Instructions    Diet - low sodium heart healthy    Complete by:  As directed   Discharge instructions    Complete by:  As directed   Please make an appointment to see your primary care physician for a hospital follow-up visit. Additionally, please continue to take your blood pressure medications every day as prescribed. Please continue to go to dialysis and follow your normal dialysis schedule.      Signed: Thomasene LotJames Azani Brogdon, MD 07/11/2016, 2:09 PM   Pager: [phone]

## 2016-07-11 NOTE — Procedures (Signed)
I was present at this dialysis session. I have reviewed the session itself and made appropriate changes.   Filed Weights   07/10/16 0539 07/11/16 0609 07/11/16 0650  Weight: 72.8 kg (160 lb 8 oz) 75.8 kg (167 lb 3.2 oz) 76.2 kg (167 lb 15.9 oz)     Recent Labs Lab 07/11/16 0659  NA 130*  K 4.6  CL 91*  CO2 25  GLUCOSE 140*  BUN 51*  CREATININE 6.21*  CALCIUM 8.7*  PHOS 5.3*     Recent Labs Lab 07/07/16 1220  07/10/16 0522 07/10/16 1258 07/11/16 0448  WBC 7.0  < > 9.6 7.4 7.7  NEUTROABS 4.9  --   --   --   --   HGB 10.3*  < > 10.8* 10.7* 9.6*  HCT 33.3*  < > 34.8* 34.2* 30.6*  MCV 86.7  < > 85.1 85.9 86.4  PLT 313  < > 430* 395 392  < > = values in this interval not displayed.  Scheduled Meds: . acetaminophen  650 mg Oral Once  . amLODipine  10 mg Oral QHS  . aspirin EC  81 mg Oral QHS  . calcium acetate  667 mg Oral TID WC  . diphenhydrAMINE      . fenofibrate  160 mg Oral Daily  . heparin  5,000 Units Subcutaneous Q8H  . HYDROmorphone  1 mg Oral Once  . insulin aspart  0-9 Units Subcutaneous TID WC  . insulin aspart  3 Units Subcutaneous TID WC  . labetalol  200 mg Oral BID  . latanoprost  1 drop Both Eyes QHS  . losartan  100 mg Oral QPM  . multivitamin  1 tablet Oral QPM  . omega-3 acid ethyl esters  1 g Oral Daily  . oxyCODONE-acetaminophen      . pantoprazole  40 mg Oral Daily  . sodium chloride flush  3 mL Intravenous Q12H  . sodium chloride flush  3 mL Intravenous Q12H   Continuous Infusions:  PRN Meds:.sodium chloride, sodium chloride, acetaminophen **OR** acetaminophen, bisacodyl, diphenhydrAMINE, oxyCODONE-acetaminophen, sodium chloride flush, sodium chloride flush   Colleen Heckyan [name]  MD 07/11/2016, 8:34 AM

## 2016-07-11 NOTE — Care Management Note (Addendum)
Case Management Note  Patient Details  Name: Colleen PlowmanDianne Demilio MRN: [id number] Date of Birth: 01/21/1946  Subjective/Objective:     Chest pain, ESRD               Action/Plan: Discharge Planning: AVS reviewed: Please previous NCM notes.  NCM spoke to pt and explained oxygen process to pt. Contacted [facility] for deliver of portable tank and rolling walker with seat. Pt states her RW was borrowed for a friend. Uses [facility] to her appointments arranged through her Medicaid. Has aide that comes on MWF for 2 hours a day to assist with ADL's [facility].    PCP- Tilford PillarWOODYEAR, WYNNE E  MD   [phone] [facility] delivered Rollator and portable oxygen to room.  Expected Discharge Date:  07/11/2016               Expected Discharge Plan:  Home/Self Care  In-House Referral:  NA  Discharge planning Services  CM Consult  Post Acute Care Choice:  NA Choice offered to:  NA  DME Arranged:  Oxygen, Walker rolling with seat DME Agency:  [facility]  HH Arranged:  NA HH Agency:  NA  Status of Service:  Completed, signed off  If discussed at Long Length of Stay Meetings, dates discussed:    Additional Comments:  Elliot CousinShavis, Dicky Boer Ellen, RN 07/11/2016, 2:11 PM

## 2016-07-11 NOTE — Progress Notes (Signed)
Pt previously requested assistance from CM to change [place] to one in [place].  States she [place] transportation to [place] but is stressed because of length of ride and difficulty in arriving there in a timely manner.  Pt called [employer] for names of physicians in network, CM called each and determined they could take new pts by the end of September - advised pt and provided contact information-she will follow up.

## 2016-07-11 NOTE — Discharge Instructions (Signed)
Post-cath instructions include no driving for 2 days. No lifting over 5 lbs for 1 week. No sexual activity for 1 week. Keep procedure site clean & dry. If you notice increased pain, swelling, bleeding or pus, call/return!  You may shower, but no soaking baths/hot tubs/pools for 1 week.

## 2016-07-11 NOTE — Progress Notes (Signed)
SATURATION QUALIFICATIONS: (This note is used to comply with regulatory documentation for home oxygen)  Patient Saturations on Room Air at Rest = 95%  Patient Saturations on Room Air while Ambulating = 87%  Patient Saturations on 2 Liters of oxygen while Ambulating = 100%  Please briefly explain why patient needs home oxygen: Pt gets very short of breath when ambulating. O2 sats dropped to 87%, patient had to stop. O2 applied at 2L - pt continued to rest and then resumed ambulation. O2 sats 97-100% on 2L while ambulating.

## 2016-07-11 NOTE — Progress Notes (Signed)
Subjective: No acute events overnight. Patient continues to do well this morning. She says her chest pain has improved some. She denies shortness of breath, nausea or vomiting. She had a full session of dialysis this morning. She is medically stable and we will discharge her this afternoon.  Objective:  Vital signs in last 24 hours: Vitals:   07/11/16 0830 07/11/16 0900 07/11/16 0930 07/11/16 1000  BP: 101/64 137/74 (!) 146/65 123/68  Pulse: 81 77 84 82  Resp: 16 14 14 12   Temp:      TempSrc:      SpO2:      Weight:      Height:       Physical Exam  Constitutional: She is oriented to person, place, and time. She appears well-developed and well-nourished.  HENT:  Head: Normocephalic and atraumatic.  EJ in place  Cardiovascular: Normal rate and regular rhythm.  Exam reveals no gallop and no friction rub.   No murmur heard. Respiratory: Effort normal. No respiratory distress. She has no wheezes.  GI: Soft. She exhibits no distension. There is no tenderness.  Musculoskeletal: She exhibits no edema.  Pain to palpation of the patient's sternum  Neurological: She is alert and oriented to person, place, and time.     Assessment/Plan:  [name] a [age] year old female with history of hypertension, diabetes, gastroesophageal reflux disease and end-stage renal disease on dialysis T, T, S who presented with a one-day history of substernal chest pain relieved with nitroglycerin, EKG without acute ST segment changes and a positive troponin.  In brief, patient had nuclear stress test yesterday which showed areas of reversible ischemia. She had a left heart catheterization which was normal without evidence to explain the patient's chest pain. She will have outpatient follow-up with cardiology and we will increase her labetalol to 200 mg twice daily. She is medically stable and we will discharge her today.  1. Chest Pain -- Patient presents with substernal chest pain relieved with  nitroglycerin. Patient hemodynamically stable with EKG not demonstrating STEMI. Echocardiogram on 10/18/2015 with normal ejection fraction of 60-65%.Patient with multiple cardiac risk factors including hypertension, hyperlipidemia, type 2 diabetes and end-stage renal disease. Differential diagnosis includes NSTEMI, demand ischemia 2/2 hypertensive emergency,gastroesophageal reflux disease, MSK pain, peptic ulcer disease or other cardiac pathology. -- EKG without evidence of acute infarct --  Patient had left heart catheterization which did not show any etiology for the patient's chest pain or abnormal nuclear study. -- Follow-up with cardiology -- Acetaminophen 650 mg as needed for pain -- Aspirin 81 mg daily --Start Protonix 40 mg once daily for gastroesophageal reflux disease  2. Hypertension -- Patient with history of hypertension. Takes amlodipine 10 mg, losartan 100 mg and labetalol 450 mg 2 times daily. Blood pressure at time of presentation 143/83. -- Continue amlodipine 10 mg once daily and losartan 100 mg once daily -- Increase labetalol to 200 mg twice a day -- Patient had dialysis 07/09/2016 -- If patient becomes hypotensive during dialysis consider decreasing her amlodipine dose. It will be important for her to continue her labetalol dose given her left ventricular hypertrophy  3. ESRD -- Patient with end-stage renal disease on hemodialysis T, T, S.  -- Continue current dialysis regimen -- Completed dialysis on 07/11/2016  4. Diabetes Mellitus Type II -- Patient with history of diabetes. Glucose at time of presentation to 297. Patient uses insulin aspart at home 0-9 units 3 times daily with meals -- Sliding scale insulin -- Modified carbohydrate diet --  Hemoglobin A1c 6.4  5. DVT/PE prophylaxis -- Heparin injection 5000 units every 8 hours  Dispo: Anticipated discharge today.   Colleen LotJames Kasidee Voisin, MD 07/11/2016, 10:31 AM Pager: [phone]

## 2016-07-11 NOTE — Discharge Summary (Signed)
Name: Colleen Mullins MRN: [id number] DOB: 12-14-1945 70 y.o. PCP: Colleen Shutter, MD  Date of Admission: 07/07/2016 11:02 AM Date of Discharge: 07/11/2016 Attending Physician: Colleen Lagos, MD  Discharge Diagnosis: 1. Atypical chest pain Active Problems:   Type 2 diabetes mellitus with renal complication (HCC)   End stage renal disease (HCC)   Elevated troponin   ESRD on dialysis (HCC)   Benign essential HTN   Hyperlipidemia   Pain in the chest   Anemia, unspecified   Chest pain   Abnormal nuclear stress test   Discharge Medications:   Medication List    STOP taking these medications   CORICIDIN 2-325 MG Tabs Generic drug:  Chlorpheniramine-APAP   hydrALAZINE 25 MG tablet Commonly known as:  APRESOLINE     TAKE these medications   acetaminophen 325 MG tablet Commonly known as:  TYLENOL Take 650 mg by mouth every 6 (six) hours as needed for moderate pain.   ACID REDUCER PO Take 1 tablet by mouth as needed (for acid reflux).   amLODipine 10 MG tablet Commonly known as:  NORVASC Take 10 mg by mouth at bedtime.   aspirin EC 81 MG tablet Take 81 mg by mouth at bedtime.   aspirin-acetaminophen-caffeine 250-250-65 MG tablet Commonly known as:  EXCEDRIN MIGRAINE Take 2 tablets by mouth every 6 (six) hours as needed for headache.   bisacodyl 5 MG EC tablet Commonly known as:  DULCOLAX Take 5 mg by mouth daily as needed for moderate constipation.   calcium acetate 667 MG capsule Commonly known as:  PHOSLO Take 1,334 mg by mouth 3 (three) times daily with meals. Takes 2 caps with meals and 1 cap with snacks   cinacalcet 60 MG tablet Commonly known as:  SENSIPAR Take 60 mg by mouth at bedtime.   CLEAR EYES OP Place 1 drop into both eyes as needed (for dry eyes).   diclofenac sodium 1 % Gel Commonly known as:  VOLTAREN Apply 2 g topically 4 (four) times daily.   fenofibrate 145 MG tablet Commonly known as:  TRICOR Take 145 mg by mouth every  evening.   fish oil-omega-3 fatty acids 1000 MG capsule Take 1 g by mouth at bedtime.   insulin aspart 100 UNIT/ML injection Commonly known as:  novoLOG Inject 0-9 Units into the skin 3 (three) times daily with meals.   KAOPECTATE 262 MG/15ML suspension Generic drug:  bismuth subsalicylate Take 30 mLs by mouth every 6 (six) hours as needed for indigestion or diarrhea or loose stools.   labetalol 200 MG tablet Commonly known as:  NORMODYNE Take 1 tablet (200 mg total) by mouth 2 (two) times daily. What changed:  medication strength  how much to take  additional instructions   losartan 100 MG tablet Commonly known as:  COZAAR Take 100 mg by mouth every evening.   multivitamin Tabs tablet Take 1 tablet by mouth every evening.   oxyCODONE-acetaminophen 5-325 MG tablet Commonly known as:  PERCOCET/ROXICET Take 1 tablet by mouth every 4 (four) hours as needed for severe pain.   Travoprost (BAK Free) 0.004 % Soln ophthalmic solution Commonly known as:  TRAVATAN Place 1 drop into both eyes at bedtime.       Disposition and follow-up:   Ms.Colleen Mullins was discharged from [facility] in Good condition.  At the hospital follow up visit please address:  1.  Follow up with cardiology on 08/12/16.  2.  Labs / imaging needed at time of follow-up: none  3.  Pending labs/ test needing follow-up: none  Follow-up Appointments:   Hospital Course by problem list: Active Problems:   Type 2 diabetes mellitus with renal complication (HCC)   End stage renal disease (HCC)   Elevated troponin   ESRD on dialysis (HCC)   Benign essential HTN   Hyperlipidemia   Pain in the chest   Anemia, unspecified   Chest pain   Abnormal nuclear stress test   1. Chest Pain: The patient presented with substernal chest pain that was partially relieved with nitroglycerin and completely resolved upon examination by the Internal Medicine team. However, the patient demonstrated  tenderness to touch on her sternum. EKG on presentation did not demonstrate STEMI. Patient was hemodynamically stable on admission, Hgb was 10.3, which was around her baseline. Troponin trended 0.22>0.23>0.23; the patient has consistently had troponin levels around 0.20 since 09/2015. Echocardiogram during admission showed 65-70% EF with grade 2 diastolic function. Patient underwent nuclear stress test, showing a large region of reversible ischemia within the anterior and anterior lateral wall as well as global hypokinesia. Patient subsequently underwent L heart catheterization with coronary angiography on 07/10/16, which showed mild to moderate, non-obstructive coronary artery disease; no angiographic findings to explain patient's atypical chest pain or myocardial perfusion stress test findings. The patient was started on Protonix to treat possible GERD and acetaminophen PRN for chest pain.The patient had a right internal jugular vein line placed in 03/27/2016 for hemodialysis access; ever since she reports having intermittent chest pain. There has been no edema, erythema, or warmth around the access site. Patient was afebrile throughout her admission and last WBC was 7.7. Patient chest pain persists at time of discharge. The patient will continue Protonix and acetaminophen as needed after discharge. Patient will follow up with cardiology in one month and with the [facility] [place] to address possible alternatives to hemodialysis access.  2. Hypertension: Patient with history of hypertension. Takes amlodipine 10 mg, losartan 100 mg and labetalol 450 mg 2 times daily. Labetalol was initially held and BP ran in the 180s/70s. The patient was started on labetalol 200 mg bid per cardiology recommendation. They also recommended adjusting amlodipine or losartan dosages if the patient became hypotensive. Pressures have been between 146-101/65-64 with her anti-hypertensive medication regimen.  3.  ESRD: The patient continued her Tu/Th/Sa dialysis schedule.  4. Diabetes Mellitus Type II: Patient with history of diabetes. Glucose at time of presentation to 297. Patient was placed on a renal/carb modified diet and continued home insulin aspart 0-9 units 3 times daily with meals.   Discharge Vitals:   BP 123/68   Pulse 82   Temp 97.9 F (36.6 C) (Oral)   Resp 12   Ht 5\' 6"  (1.676 m)   Wt 76.2 kg (167 lb 15.9 oz)   SpO2 100%   BMI 27.11 kg/m   Pertinent Labs, Studies, and Procedures:   EKG 07/08/2016 Sinus rhythm with Premature supraventricular complexes. Left anterior fascicular block  Nm Myocar Multi W/spect W/wall Motion / Ef  07/09/2016 IMPRESSION: 1. Large region of reversible ischemia within the anterior and anterior lateral wall. 2. Global hypokinesia. 3. Left ventricular ejection fraction 36% 4. Non invasive risk stratification: High  Transthoracic Echocardiography 07/09/2016 There was severe concentric hypertrophy. Systolic function was vigorous. The estimated ejection fraction was in the range of 65% to 70%. Wall motion was normal; there were no regional wall motion abnormalities. Features are consistent with a pseudonormal left ventricular filling pattern, with concomitant abnormal relaxation  and increased filling pressure (grade 2 diastolic dysfunction). Compared to the prior study, there has been no significant interval change.  Left Heart Cath and Coronary Angiography 07/10/16 1. Mild to moderate, non-obstructive coronary artery disease. No angiographic findings to explain patient's atypical chest pain or myocardial perfusion stress test findings. 2. Normal left ventricular filling pressure  Troponin I     Status: Abnormal   Collection Time: 07/07/16  2:28 PM  Result Value Ref Range   Troponin I 0.22 (HH) <0.03 ng/mL                    Troponin I     Status: Abnormal   Collection Time: 07/07/16  8:09 PM  Result Value Ref Range   Troponin I 0.23  (HH) <0.03 ng/mL       Troponin I     Status: Abnormal   Collection Time: 07/08/16  1:23 AM  Result Value Ref Range   Troponin I 0.23 (HH) <0.03 ng/mL        CBC     Status: Abnormal   Collection Time: 07/11/16  4:48 AM  Result Value Ref Range   WBC 7.7 4.0 - 10.5 K/uL   RBC 3.54 (L) 3.87 - 5.11 MIL/uL   Hemoglobin 9.6 (L) 12.0 - 15.0 g/dL   HCT 11.930.6 (L) 14.736.0 - 82.946.0 %   MCV 86.4 78.0 - 100.0 fL   MCH 27.1 26.0 - 34.0 pg   MCHC 31.4 30.0 - 36.0 g/dL   RDW 56.218.2 (H) 13.011.5 - 86.515.5 %   Platelets 392 150 - 400 K/uL  Renal function panel     Status: Abnormal   Collection Time: 07/11/16  6:59 AM  Result Value Ref Range   Sodium 130 (L) 135 - 145 mmol/L   Potassium 4.6 3.5 - 5.1 mmol/L   Chloride 91 (L) 101 - 111 mmol/L   CO2 25 22 - 32 mmol/L   Glucose, Bld 140 (H) 65 - 99 mg/dL   BUN 51 (H) 6 - 20 mg/dL   Creatinine, Ser 7.846.21 (H) 0.44 - 1.00 mg/dL   Calcium 8.7 (L) 8.9 - 10.3 mg/dL   Phosphorus 5.3 (H) 2.5 - 4.6 mg/dL   Albumin 3.1 (L) 3.5 - 5.0 g/dL   GFR calc non Af Amer 6 (L) >60 mL/min   GFR calc Af Amer 7 (L) >60 mL/min         Discharge Instructions: Referral to cardiology, follow up appointment 08/12/16 at 11:15 with Dr. Mayford Knifeurner. Ischemic cause of chest pain is unlikely. Patient demonstrates grade 2 diastolic dysfunction. Pain is possibly from GERD, PUD, or musculoskeletal pain. Patient should continue Protonix 40 mg daily and acetaminophen as needed for pain. Continue low dose aspirin and blood pressure control for secondary prevention of CAD.   Signed: Philomena DohenyWilliam Tonae Livolsi, Medical Student 07/11/2016, 10:36 AM

## 2016-07-11 NOTE — Progress Notes (Signed)
SUBJECTIVE: Patient undergoing dialysis. Complains of chest pain, but no other acute complaints or events.  BP 123/68   Pulse 82   Temp 97.9 F (36.6 C) (Oral)   Resp 12   Ht 5\' 6"  (1.676 m)   Wt 76.2 kg (167 lb 15.9 oz)   SpO2 100%   BMI 27.11 kg/m   Intake/Output Summary (Last 24 hours) at 07/11/16 1019 Last data filed at 07/10/16 2300  Gross per 24 hour  Intake              480 ml  Output                0 ml  Net              480 ml    PHYSICAL EXAM No exam performed, patient was away at dialysis  LABS/IMAGING  CBC     Status: Abnormal   Collection Time: 07/11/16  4:48 AM  Result Value Ref Range   WBC 7.7 4.0 - 10.5 K/uL   RBC 3.54 (L) 3.87 - 5.11 MIL/uL   Hemoglobin 9.6 (L) 12.0 - 15.0 g/dL   HCT 16.130.6 (L) 09.636.0 - 04.546.0 %   MCV 86.4 78.0 - 100.0 fL   MCH 27.1 26.0 - 34.0 pg   MCHC 31.4 30.0 - 36.0 g/dL   RDW 40.918.2 (H) 81.111.5 - 91.415.5 %   Platelets 392 150 - 400 K/uL  Renal function panel     Status: Abnormal   Collection Time: 07/11/16  6:59 AM  Result Value Ref Range   Sodium 130 (L) 135 - 145 mmol/L   Potassium 4.6 3.5 - 5.1 mmol/L   Chloride 91 (L) 101 - 111 mmol/L   CO2 25 22 - 32 mmol/L   Glucose, Bld 140 (H) 65 - 99 mg/dL   BUN 51 (H) 6 - 20 mg/dL   Creatinine, Ser 7.826.21 (H) 0.44 - 1.00 mg/dL   Calcium 8.7 (L) 8.9 - 10.3 mg/dL   Phosphorus 5.3 (H) 2.5 - 4.6 mg/dL   Albumin 3.1 (L) 3.5 - 5.0 g/dL   GFR calc non Af Amer 6 (L) >60 mL/min   GFR calc Af Amer 7 (L) >60 mL/min      Current Meds: . acetaminophen  650 mg Oral Once  . amLODipine  10 mg Oral QHS  . aspirin EC  81 mg Oral QHS  . calcium acetate  667 mg Oral TID WC  . fenofibrate  160 mg Oral Daily  . heparin  5,000 Units Subcutaneous Q8H  . HYDROmorphone  1 mg Oral Once  . insulin aspart  0-9 Units Subcutaneous TID WC  . insulin aspart  3 Units Subcutaneous TID WC  . labetalol  200 mg Oral BID  . latanoprost  1 drop Both Eyes QHS  . losartan  100 mg Oral QPM  . multivitamin  1 tablet Oral  QPM  . omega-3 acid ethyl esters  1 g Oral Daily  . pantoprazole  40 mg Oral Daily  . sodium chloride flush  3 mL Intravenous Q12H  . sodium chloride flush  3 mL Intravenous Q12H     ASSESSMENT AND PLAN: Ms. Jenean LindauRhodes is a 70 y.o. femalewith a past medical history significant for ESRD on HD, DM2, HTN who presentedwith sharp non-radiating substernal chest pain that was relieved with nitroglycerine, but subsided by our initial evaluation. Patient continues to complain of chest discomfort, which she has experienced intermittently since she had R internal jugular catheter placed  on 03/27/2016.   Active Problems:   Type 2 diabetes mellitus with renal complication (HCC)   End stage renal disease (HCC)   Elevated troponin   ESRD on dialysis (HCC)   Benign essential HTN   Hyperlipidemia   Pain in the chest   Anemia, unspecified   Chest pain   Abnormal nuclear stress test  1. Chest Pain Patient continues to have chest discomfort.The patient has no pain, warmth, or erythema around IJV line site, patient is afebrile and WBC is 7.0. EKG did not demonstrate STEMI, troponin trended 0.22>0.23>0.23. Patient troponin trended during admission on 04/14/2016 and was between 0.21-0.25.CXR showed enlargement of cardiac silhouette with pulmonary vascular congestion, with no other signs of cardiopulmonary processes. ECHO 07/09/2016 showed severe LV concentric hypertrophy. Systolic function was vigorous. The estimated ejection fraction was in the range of 65% to 70%, with grade 2 diastolic dysfunction. Nuclear stress test showed large region of reversible ischemia within the anterior and anterior lateral wall. L heart catheterization showed moderate non obstructive ASCAD with nothing to account for patient's SOB or atypical chest pain. Patient is afebrile and hemodynamically stable. H/H is 9.6/30.6 following L heart cath (10.7/34.2 yesterday). There are no symptoms of GI bleed, and lipase was 48. She had previously  taken Nexium for GERD, but is no longer taking it. Also on differential is GERD and MSK pain. Patient also complains of ongoing dyspnea on exertion.  - Continue Protonix 40 mg daily  - ContinueAspirin 81 mg daily - Continue home amlodipine 10 mg daily - Continue home losartan 100 mg daily - Continue labetalol 200 mg bid, decrease ARB or amlodipine dose if BP gets too low per cards rec. - obtain walk test with pulse oximetry to assess need for home supplemental oxygen - [facility] pain from [facility] access site  2. Hypertension: Patient takes amlodipine 10 mg, losartan 100 mg, and labetalol 450 mg bid at home, holding labetalol at admissionadmission. BP 154/59 on presentation. Patient labetalol changed to 200 mg bid on 8/16, pressures have been between 146-101/65, latest BP is 123/68. - Patient with history of hypertension. Takes amlodipine 10 mg, losartan 100 mg and labetalol 450 mg 2 times daily. Blood pressure at time of presentation 143/83. - Continue home amlodipine, losartan, and labetalol as above  3. ESRD - Continue dialysis Tu/Th/Sa, dialysis today  4. Diabetes Mellitus Type II: glucose was 297 on presentation. Patient uses insulin aspart at home 0-9 units 3 times daily with meals - Sliding scale insulin - renal/carb modified diet  5. Joint pain: patient reports long-standing joint pain in hands, R knee > L knee, and left shoulder. Denies having rheumatological workup. - Acetaminophen 650 mg PRN - referral to outpatient orthopedic surgeon for evaluation  6. DVT/PE prophylaxis - heparin 5,000 U q8h  Dispo: Anticipated discharge approximately within 1 day.  Building services engineerigned Will Venissa Nappi Medical Student 8/17/201710:19 AM

## 2016-07-26 ENCOUNTER — Encounter: Payer: Self-pay | Admitting: Cardiology

## 2016-08-10 NOTE — Progress Notes (Signed)
Dictation #1 [id number]  [id number]    Cardiology Office Note    Date:  08/12/2016   ID:  Colleen PlowmanDianne Mullins, DOB 08/01/1946, MRN [id number]  PCP:  Zoila ShutterWOODYEAR,WYNNE E, MD  Cardiologist:  Armanda Magicraci Turner, MD   Chief Complaint  Patient presents with  . Coronary Artery Disease  . Hypertension  . Hyperlipidemia    History of Present Illness:  Colleen Mullins is a 70 y.o. female with a history of ASCAD with recent cath showing 60% D1 and 50% PL off the RCA by cath 06/2016 on medical management, DM, ESRD on HD, GERD, HTN and hyperlipidemia.  [facility] with complaints of chest pain and low level elevation of Trop.  Echo showed normal LVF with EF 65-70% with G2DD.  Nuclear stress test revealed reversible ischemia in the anterior and anterolateral walls with global HK.  She underwent cath showing mild to moderate non obstructive CAD and CP was felt to be related to GERD. She presents back today doing well. She denies any chest pain, SOB, DOE, LE edema, dizziness, papitations or syncope.      Past Medical History:  Diagnosis Date  . Anemia    low iron  . Arthritis   . CAD in native artery 08/12/2016   Nonobstructive ASCAD by cath with 60% D1 and 50% PL off RCA and 25% mid LAD.  . Diabetes mellitus    type 2  . Diabetic retinopathy (HCC)   . Dialysis patient [facility](HCC)    M-W-F @ [facility]  . Ejection fraction   . ESRD on hemodialysis [facility](HCC)    Dialysis T/Th/Sa  . GERD (gastroesophageal reflux disease)   . Glaucoma   . H/O: Bell's palsy 2007  . Hyperlipidemia   . Hypertension    a. labile - hx of BP dropping at HD.  . Macular degeneration   . Shortness of breath dyspnea    with walking    Past Surgical History:  Procedure Laterality Date  . ABDOMINAL HYSTERECTOMY    . ARTERIOVENOUS GRAFT PLACEMENT     Left forearm x 2, one removed in March  . ARTERIOVENOUS GRAFT PLACEMENT     Left forearm  . AVGG REMOVAL  10/08/2011   Procedure: REMOVAL OF ARTERIOVENOUS GORETEX  GRAFT (AVGG);  Surgeon: Sherren Kernsharles E Fields, MD;  [facility];  Service: Vascular;  Laterality: Left;  . BTL    . CARDIAC CATHETERIZATION N/A 07/10/2016   Procedure: Left Heart Cath and Coronary Angiography;  Surgeon: Yvonne Kendallhristopher End, MD;  [facility];  Service: Cardiovascular;  Laterality: N/A;  . COLONOSCOPY    . EXCHANGE OF A DIALYSIS CATHETER Right 03/27/2016   Procedure: EXCHANGE OF A DIALYSIS CATHETER;  Surgeon: Fransisco HertzBrian L Chen, MD;  [facility];  Service: Vascular;  Laterality: Right;  . EYE SURGERY Left    Lasik surgery on left. Cataract surgery on both eyes  . LAPAROTOMY  ?2000   Pelvic mass (Benign)  . REVISION OF ARTERIOVENOUS GORETEX GRAFT Left 03/27/2016   Procedure: REVISION OF LEFT ARTERIOVENOUS GORETEX GRAFT;  Surgeon: Fransisco HertzBrian L Chen, MD;  [facility];  Service: Vascular;  Laterality: Left;  . TONSILLECTOMY      Current Medications: Outpatient Medications Prior to Visit  Medication Sig Dispense Refill  . acetaminophen (TYLENOL) 325 MG tablet Take 650 mg by mouth every 6 (six) hours as needed for moderate pain.    Marland Kitchen. amLODipine (NORVASC) 10 MG tablet Take 10 mg by mouth at bedtime.     .Marland Kitchen  aspirin EC 81 MG tablet Take 81 mg by mouth at bedtime.     Marland Kitchen aspirin-acetaminophen-caffeine (EXCEDRIN MIGRAINE) 250-250-65 MG tablet Take 2 tablets by mouth every 6 (six) hours as needed for headache.    . bisacodyl (DULCOLAX) 5 MG EC tablet Take 5 mg by mouth daily as needed for moderate constipation.    . bismuth subsalicylate (KAOPECTATE) 262 MG/15ML suspension Take 30 mLs by mouth every 6 (six) hours as needed for indigestion or diarrhea or loose stools.    . calcium acetate (PHOSLO) 667 MG capsule Take 1,334 mg by mouth 3 (three) times daily with meals. Takes 2 caps with meals and 1 cap with snacks    . cinacalcet (SENSIPAR) 60 MG tablet Take 60 mg by mouth 2 (two) times a week.     . diclofenac sodium (VOLTAREN) 1 % GEL Apply 2 g topically 4 (four) times daily. 1 Tube 0   . fenofibrate (TRICOR) 145 MG tablet Take 145 mg by mouth every evening.     . fish oil-omega-3 fatty acids 1000 MG capsule Take 1 g by mouth at bedtime.     . insulin aspart (NOVOLOG) 100 UNIT/ML injection Inject 0-9 Units into the skin 3 (three) times daily with meals. 10 mL 11  . labetalol (NORMODYNE) 200 MG tablet Take 1 tablet (200 mg total) by mouth 2 (two) times daily. 60 tablet 3  . losartan (COZAAR) 100 MG tablet Take 100 mg by mouth every evening.    . multivitamin (RENA-VIT) TABS tablet Take 1 tablet by mouth every evening.     . Naphazoline HCl (CLEAR EYES OP) Place 1 drop into both eyes as needed (for dry eyes).    Marland Kitchen oxyCODONE-acetaminophen (PERCOCET/ROXICET) 5-325 MG tablet Take 1 tablet by mouth every 4 (four) hours as needed for severe pain.    . RaNITidine HCl (ACID REDUCER PO) Take 1 tablet by mouth as needed (for acid reflux).    . Travoprost, BAK Free, (TRAVATAN) 0.004 % SOLN ophthalmic solution Place 1 drop into both eyes at bedtime.     No facility-administered medications prior to visit.      Allergies:   Lactose intolerance (gi)   Social History   Social History  . Marital status: Widowed    Spouse name: N/A  . Number of children: N/A  . Years of education: N/A   Social History Main Topics  . Smoking status: Former Smoker    Types: Cigarettes    Quit date: 08/04/1999  . Smokeless tobacco: Never Used  . Alcohol use No  . Drug use: No  . Sexual activity: Not Asked   Other Topics Concern  . None   Social History Narrative  . None     Family History:  The patient's family history includes Breast cancer in her sister; COPD in her father; Diabetes in her father, mother, and sister; Heart disease in her mother; Hypertension in her father; Lung disease in her father.   ROS:   Please see the history of present illness.    ROS All other systems reviewed and are negative.  No flowsheet data found.     PHYSICAL EXAM:   VS:  BP (!) 150/60   Pulse 71    Ht 5\' 6"  (1.676 m)   Wt 170 lb 12.8 oz (77.5 kg)   SpO2 97%   BMI 27.57 kg/m    GEN: Well nourished, well developed, in no acute distress  HEENT: normal  Neck: no JVD, carotid bruits, or  masses Cardiac: RRR; no murmurs, rubs, or gallops,no edema.  Intact distal pulses bilaterally.  Respiratory:  clear to auscultation bilaterally, normal work of breathing GI: soft, nontender, nondistended, + BS MS: no deformity or atrophy  Skin: warm and dry, no rash Neuro:  Alert and Oriented x 3, Strength and sensation are intact Psych: euthymic mood, full affect  Wt Readings from Last 3 Encounters:  08/12/16 170 lb 12.8 oz (77.5 kg)  07/11/16 167 lb 15.9 oz (76.2 kg)  04/26/16 167 lb (75.8 kg)      Studies/Labs Reviewed:   EKG:  EKG is ordered today.  The ekg ordered today demonstrates NSR at 70bpm with inferior infarct and anterior infarct and no ST changes  Recent Labs: 04/14/2016: Magnesium 2.8 07/07/2016: ALT 20 07/11/2016: BUN 11; Creatinine, Ser 2.54; Hemoglobin 11.0; Platelets 409; Potassium 3.7; Sodium 131   Lipid Panel No results found for: CHOL, TRIG, HDL, CHOLHDL, VLDL, LDLCALC, LDLDIRECT  Additional studies/ records that were reviewed today include:  hosptial records    ASSESSMENT:    1. CAD in native artery   2. Benign essential HTN   3. Other chest pain   4. Hyperlipidemia   5. ESRD on dialysis [facility])      PLAN:  In order of problems listed above:  1. HTN - BP borderline controlled today. She says that on a good day her BP will be 120/36mmHg but sometimes as high 180/67mmHg.  She has not taken her meds yet today. Continue amlodipine/labetolol/ARB. I have asked her to check her BP daily for a week and call with the results.  2. Atypical chest pain that may be related to poorly controlled HTN or GERD but also could be due to coronary spasm/microvascular angina.  I will add imdur 15mg  daily to see if that helps with CP.   3. ASCAD - non osbtructive by recent cath.   Continue ASA/BB.  She is not on a statin. I will get a copy of last FLP.   4. Hyperlipidemia - she is currently on fenofibrate.  I will get a copy of her FLP from her PCP.  5. ESRD on HD  Followup with PA in 6 weeks and with me in 6 months  Medication Adjustments/Labs and Tests Ordered: Current medicines are reviewed at length with the patient today.  Concerns regarding medicines are outlined above.  Medication changes, Labs and Tests ordered today are listed in the Patient Instructions below.  There are no Patient Instructions on file for this visit.   Signed, Armanda Magic, MD  08/12/2016 12:07 PM    [facility] [address] [place], [place], [place]  [address] Phone: [phone]; Fax: [phone]

## 2016-08-12 ENCOUNTER — Ambulatory Visit (INDEPENDENT_AMBULATORY_CARE_PROVIDER_SITE_OTHER): Payer: Medicare HMO | Admitting: Cardiology

## 2016-08-12 ENCOUNTER — Encounter: Payer: Self-pay | Admitting: Cardiology

## 2016-08-12 VITALS — BP 150/60 | HR 71 | Ht 66.0 in | Wt 170.8 lb

## 2016-08-12 DIAGNOSIS — E785 Hyperlipidemia, unspecified: Secondary | ICD-10-CM | POA: Diagnosis not present

## 2016-08-12 DIAGNOSIS — R0789 Other chest pain: Secondary | ICD-10-CM | POA: Diagnosis not present

## 2016-08-12 DIAGNOSIS — N186 End stage renal disease: Secondary | ICD-10-CM

## 2016-08-12 DIAGNOSIS — I10 Essential (primary) hypertension: Secondary | ICD-10-CM

## 2016-08-12 DIAGNOSIS — I2510 Atherosclerotic heart disease of native coronary artery without angina pectoris: Secondary | ICD-10-CM | POA: Diagnosis not present

## 2016-08-12 DIAGNOSIS — Z992 Dependence on renal dialysis: Secondary | ICD-10-CM

## 2016-08-12 HISTORY — DX: Atherosclerotic heart disease of native coronary artery without angina pectoris: I25.10

## 2016-08-12 MED ORDER — ISOSORBIDE MONONITRATE ER 30 MG PO TB24: 15.0000 mg | ORAL_TABLET | Freq: Every day | ORAL | 3 refills | Status: DC

## 2016-08-12 NOTE — Patient Instructions (Addendum)
Medication Instructions:  1) START Imdur 15mg  once daily 2) DISCONTINUE Voltaren Gel  Labwork: None  Testing/Procedures: None  Follow-Up: Your physician recommends that you schedule a follow-up appointment in: 6 weeks with PA/NP.  Your physician wants you to follow-up in: 6 months with Dr. Mayford Knifeurner.  You will receive a reminder letter in the mail two months in advance. If you don't receive a letter, please call our office to schedule the follow-up appointment.   Any Other Special Instructions Will Be Listed Below (If Applicable).     If you need a refill on your cardiac medications before your next appointment, please call your pharmacy.

## 2016-08-16 ENCOUNTER — Encounter: Payer: Self-pay | Admitting: Vascular Surgery

## 2016-08-23 ENCOUNTER — Ambulatory Visit: Payer: Medicare HMO | Admitting: Vascular Surgery

## 2016-08-26 ENCOUNTER — Encounter (HOSPITAL_COMMUNITY): Payer: Self-pay

## 2016-08-26 ENCOUNTER — Emergency Department (HOSPITAL_BASED_OUTPATIENT_CLINIC_OR_DEPARTMENT_OTHER)
Admit: 2016-08-26 | Discharge: 2016-08-26 | Disposition: A | Discharge status: Home / Self Care | Payer: Medicare HMO | Attending: Emergency Medicine | Admitting: Emergency Medicine

## 2016-08-26 ENCOUNTER — Emergency Department (HOSPITAL_COMMUNITY)
Admission: EM | Admit: 2016-08-26 | Discharge: 2016-08-26 | Disposition: A | Discharge status: Home / Self Care | Payer: Medicare HMO | Attending: Emergency Medicine | Admitting: Emergency Medicine

## 2016-08-26 DIAGNOSIS — N186 End stage renal disease: Secondary | ICD-10-CM | POA: Insufficient documentation

## 2016-08-26 DIAGNOSIS — Z87891 Personal history of nicotine dependence: Secondary | ICD-10-CM | POA: Diagnosis not present

## 2016-08-26 DIAGNOSIS — I120 Hypertensive chronic kidney disease with stage 5 chronic kidney disease or end stage renal disease: Secondary | ICD-10-CM | POA: Diagnosis not present

## 2016-08-26 DIAGNOSIS — E1122 Type 2 diabetes mellitus with diabetic chronic kidney disease: Secondary | ICD-10-CM | POA: Diagnosis not present

## 2016-08-26 DIAGNOSIS — M79662 Pain in left lower leg: Secondary | ICD-10-CM | POA: Insufficient documentation

## 2016-08-26 DIAGNOSIS — M79605 Pain in left leg: Secondary | ICD-10-CM

## 2016-08-26 DIAGNOSIS — Z794 Long term (current) use of insulin: Secondary | ICD-10-CM | POA: Diagnosis not present

## 2016-08-26 DIAGNOSIS — E11311 Type 2 diabetes mellitus with unspecified diabetic retinopathy with macular edema: Secondary | ICD-10-CM | POA: Diagnosis not present

## 2016-08-26 DIAGNOSIS — M79609 Pain in unspecified limb: Secondary | ICD-10-CM | POA: Diagnosis not present

## 2016-08-26 DIAGNOSIS — Z992 Dependence on renal dialysis: Secondary | ICD-10-CM | POA: Diagnosis not present

## 2016-08-26 DIAGNOSIS — I2510 Atherosclerotic heart disease of native coronary artery without angina pectoris: Secondary | ICD-10-CM | POA: Diagnosis not present

## 2016-08-26 DIAGNOSIS — Z7982 Long term (current) use of aspirin: Secondary | ICD-10-CM | POA: Diagnosis not present

## 2016-08-26 DIAGNOSIS — Z79899 Other long term (current) drug therapy: Secondary | ICD-10-CM | POA: Insufficient documentation

## 2016-08-26 LAB — CBC WITH DIFFERENTIAL/PLATELET
BASOS ABS: 0.1 10*3/uL (ref 0.0–0.1)
BASOS PCT: 1 %
EOS PCT: 1 %
Eosinophils Absolute: 0.1 10*3/uL (ref 0.0–0.7)
HEMATOCRIT: 37 % (ref 36.0–46.0)
HEMOGLOBIN: 10.8 g/dL — AB (ref 12.0–15.0)
LYMPHS PCT: 35 %
Lymphs Abs: 2.6 10*3/uL (ref 0.7–4.0)
MCH: 28.4 pg (ref 26.0–34.0)
MCHC: 29.2 g/dL — AB (ref 30.0–36.0)
MCV: 97.4 fL (ref 78.0–100.0)
MONOS PCT: 3 %
Monocytes Absolute: 0.2 10*3/uL (ref 0.1–1.0)
NEUTROS ABS: 4.3 10*3/uL (ref 1.7–7.7)
Neutrophils Relative %: 60 %
Platelets: 601 10*3/uL — ABNORMAL HIGH (ref 150–400)
RBC: 3.8 MIL/uL — ABNORMAL LOW (ref 3.87–5.11)
RDW: 22.3 % — ABNORMAL HIGH (ref 11.5–15.5)
WBC: 7.3 10*3/uL (ref 4.0–10.5)

## 2016-08-26 LAB — BASIC METABOLIC PANEL
ANION GAP: 16 — AB (ref 5–15)
BUN: 44 mg/dL — AB (ref 6–20)
CHLORIDE: 92 mmol/L — AB (ref 101–111)
CO2: 31 mmol/L (ref 22–32)
Calcium: 9.5 mg/dL (ref 8.9–10.3)
Creatinine, Ser: 6.98 mg/dL — ABNORMAL HIGH (ref 0.44–1.00)
GFR calc Af Amer: 6 mL/min — ABNORMAL LOW (ref >60)
GFR calc non Af Amer: 5 mL/min — ABNORMAL LOW (ref >60)
GLUCOSE: 146 mg/dL — AB (ref 65–99)
Potassium: 4.3 mmol/L (ref 3.5–5.1)
Sodium: 139 mmol/L (ref 135–145)

## 2016-08-26 LAB — CBG MONITORING, ED: Glucose-Capillary: 159 mg/dL — ABNORMAL HIGH (ref 65–99)

## 2016-08-26 MED ORDER — OXYCODONE-ACETAMINOPHEN 5-325 MG PO TABS
1.0000 | ORAL_TABLET | ORAL | Status: DC | PRN
  Administered 2016-08-26: 1 via ORAL

## 2016-08-26 MED ORDER — OXYCODONE-ACETAMINOPHEN 5-325 MG PO TABS
ORAL_TABLET | ORAL | Status: AC
  Filled 2016-08-26: qty 1

## 2016-08-26 MED ORDER — OXYCODONE-ACETAMINOPHEN 5-325 MG PO TABS: 1.0000 | ORAL_TABLET | Freq: Four times a day (QID) | ORAL | 0 refills | Status: DC | PRN

## 2016-08-26 MED ORDER — METHOCARBAMOL 500 MG PO TABS
500.0000 mg | ORAL_TABLET | Freq: Once | ORAL | Status: AC
  Administered 2016-08-26: 500 mg via ORAL
  Filled 2016-08-26: qty 1

## 2016-08-26 MED ORDER — CYCLOBENZAPRINE HCL 5 MG PO TABS: 5.0000 mg | ORAL_TABLET | Freq: Two times a day (BID) | ORAL | 0 refills | Status: DC | PRN

## 2016-08-26 NOTE — ED Triage Notes (Signed)
Per Pt, Pt is coming from home with complaints of leg cramps that started on Saturday after her dialysis. Pt reports the leg cramps have continued in the left leg where her dialysis catheter is placed.

## 2016-08-26 NOTE — Discharge Instructions (Addendum)
You have been seen today for lower leg pain. Your imaging and lab tests showed no acute abnormalities. Follow up with PCP as soon as possible for reevaluation and chronic management of this issue. Return to ED should symptoms worsen.  Use Tylenol for pain relief. Percocet for severe pain. Note that the Percocet contains Tylenol. Do not ingest more than 2000 mg of Tylenol in a 24-hour period. Use Flexeril, a muscle relaxer, as needed for muscle cramps. Do not drive or perform other dangerous activities while taking the Percocet or the Flexeril.

## 2016-08-26 NOTE — ED Notes (Signed)
Pt d/c home via w/c.

## 2016-08-26 NOTE — ED Provider Notes (Signed)
[facility]-EMERGENCY DEPT Provider Note   CSN: [id number] Arrival date & time: 08/26/16  0950     History   Chief Complaint Chief Complaint  Patient presents with  . Leg Pain    HPI Cherylynn Liszewski is a 70 y.o. female.  HPI   Marsheila Alejo is a 70 y.o. female, with a history of DM, ESRD on dialysis, and CAD, presenting to the ED with Left lower leg pain that began around noon on Saturday, September 30. Patient had completed dialysis when she began to have this pain. Pain is severe, alternates between cramping and shooting, radiates from the foot into the lower leg below the knee. Patient has tried Percocet and Tylenol without relief. Patient is on a Tuesday, Thursday, and Saturday schedule. Patient states that she does have a graft in her left thigh, but this is not used for her dialysis due to a blockage. There is a port in the patient's right chest that is used instead. Recent procedures include port implantation and attempted graft modification on 03/27/2016. Patient denies fever/chills, neuro deficits, falls/trauma, shortness of breath, chest pain, or any other complaints.    Past Medical History:  Diagnosis Date  . Anemia    low iron  . Arthritis   . CAD in native artery 08/12/2016   Nonobstructive ASCAD by cath with 60% D1 and 50% PL off RCA and 25% mid LAD.  . Diabetes mellitus    type 2  . Diabetic retinopathy (HCC)   . Dialysis patient [facility])    M-W-F @ [facility]  . Ejection fraction   . ESRD on hemodialysis [facility]    Dialysis T/Th/Sa  . GERD (gastroesophageal reflux disease)   . Glaucoma   . H/O: Bell's palsy 2007  . Hyperlipidemia   . Hypertension    a. labile - hx of BP dropping at HD.  . Macular degeneration   . Shortness of breath dyspnea    with walking    Patient Active Problem List   Diagnosis Date Noted  . CAD in native artery 08/12/2016  . Abnormal nuclear stress test 07/10/2016  . Chest pain   . Anemia, unspecified 07/08/2016  . Pain in the chest  07/07/2016  . Dyspnea on exertion 04/14/2016  . SOB (shortness of breath) 04/14/2016  . ESRD on dialysis (HCC) 04/14/2016  . Benign essential HTN 04/14/2016  . Hyperlipidemia 04/14/2016  . 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 Hypoglycemia 10/16/2015  . Elevated troponin 10/16/2015  . Ejection fraction   . Type 2 diabetes mellitus with renal complication (HCC) 09/26/2011  . Other complications due to renal dialysis device, implant, and graft 09/26/2011  . End stage renal disease (HCC) 09/26/2011    Past Surgical History:  Procedure Laterality Date  . ABDOMINAL HYSTERECTOMY    . ARTERIOVENOUS GRAFT PLACEMENT     Left forearm x 2, one removed in March  . ARTERIOVENOUS GRAFT PLACEMENT     Left forearm  . AVGG REMOVAL  10/08/2011   Procedure: REMOVAL OF ARTERIOVENOUS GORETEX GRAFT (AVGG);  Surgeon: Sherren Kerns, MD;  Location: [facility] OR;  Service: Vascular;  Laterality: Left;  . BTL    . CARDIAC CATHETERIZATION N/A 07/10/2016   Procedure: Left Heart Cath and Coronary Angiography;  Surgeon: Yvonne Kendall, MD;  Location: [facility] INVASIVE CV LAB;  Service: Cardiovascular;  Laterality: N/A;  . COLONOSCOPY    . EXCHANGE OF A DIALYSIS CATHETER Right 03/27/2016   Procedure: EXCHANGE OF A DIALYSIS CATHETER;  Surgeon: Fransisco Hertz,  MD;  Location: [facility] OR;  Service: Vascular;  Laterality: Right;  . EYE SURGERY Left    Lasik surgery on left. Cataract surgery on both eyes  . LAPAROTOMY  ?2000   Pelvic mass (Benign)  . REVISION OF ARTERIOVENOUS GORETEX GRAFT Left 03/27/2016   Procedure: REVISION OF LEFT ARTERIOVENOUS GORETEX GRAFT;  Surgeon: Fransisco Hertz, MD;  Location[facility] OR;  Service: Vascular;  Laterality: Left;  . TONSILLECTOMY      OB History    No data available       Home Medications    Prior to Admission medications   Medication Sig Start Date End Date Taking? Authorizing Provider  acetaminophen (TYLENOL) 325 MG tablet Take 650 mg by mouth every 6 (six) hours as  needed for moderate pain.    Historical Provider, MD  amLODipine (NORVASC) 10 MG tablet Take 10 mg by mouth at bedtime.     Historical Provider, MD  aspirin EC 81 MG tablet Take 81 mg by mouth at bedtime.     Historical Provider, MD  aspirin-acetaminophen-caffeine (EXCEDRIN MIGRAINE) [id number] MG tablet Take 2 tablets by mouth every 6 (six) hours as needed for headache.    Historical Provider, MD  bisacodyl (DULCOLAX) 5 MG EC tablet Take 5 mg by mouth daily as needed for moderate constipation.    Historical Provider, MD  bismuth subsalicylate (KAOPECTATE) 262 MG/15ML suspension Take 30 mLs by mouth every 6 (six) hours as needed for indigestion or diarrhea or loose stools.    Historical Provider, MD  calcium acetate (PHOSLO) 667 MG capsule Take 1,334 mg by mouth 3 (three) times daily with meals. Takes 2 caps with meals and 1 cap with snacks    Historical Provider, MD  cinacalcet (SENSIPAR) 60 MG tablet Take 60 mg by mouth 2 (two) times a week.     Historical Provider, MD  cyclobenzaprine (FLEXERIL) 5 MG tablet Take 1 tablet (5 mg total) by mouth 2 (two) times daily as needed for muscle spasms. 08/26/16   Shawn C Joy, PA-C  fenofibrate (TRICOR) 145 MG tablet Take 145 mg by mouth every evening.     Historical Provider, MD  fish oil-omega-3 fatty acids 1000 MG capsule Take 1 g by mouth at bedtime.     Historical Provider, MD  insulin aspart (NOVOLOG) 100 UNIT/ML injection Inject 0-9 Units into the skin 3 (three) times daily with meals. 10/18/15   Jeralyn Bennett, MD  isosorbide mononitrate (IMDUR) 30 MG 24 hr tablet Take 0.5 tablets (15 mg total) by mouth daily. 08/12/16 11/10/16  Quintella Reichert, MD  labetalol (NORMODYNE) 200 MG tablet Take 1 tablet (200 mg total) by mouth 2 (two) times daily. 07/11/16   Thomasene Lot, MD  losartan (COZAAR) 100 MG tablet Take 100 mg by mouth every evening.    Historical Provider, MD  multivitamin (RENA-VIT) TABS tablet Take 1 tablet by mouth every evening.     Historical  Provider, MD  Naphazoline HCl (CLEAR EYES OP) Place 1 drop into both eyes as needed (for dry eyes).    Historical Provider, MD  oxyCODONE-acetaminophen (PERCOCET/ROXICET) 5-325 MG tablet Take 1 tablet by mouth every 4 (four) hours as needed for severe pain.    Historical Provider, MD  oxyCODONE-acetaminophen (PERCOCET/ROXICET) 5-325 MG tablet Take 1-2 tablets by mouth every 6 (six) hours as needed for severe pain. 08/26/16   Shawn C Joy, PA-C  RaNITidine HCl (ACID REDUCER PO) Take 1 tablet by mouth as needed (for acid reflux).    Historical  Provider, MD  Travoprost, BAK Free, (TRAVATAN) 0.004 % SOLN ophthalmic solution Place 1 drop into both eyes at bedtime.    Historical Provider, MD    Family History Family History  Problem Relation Age of Onset  . Diabetes Mother   . Heart disease Mother   . COPD Father   . Hypertension Father   . Lung disease Father   . Diabetes Father   . Diabetes Sister   . Breast cancer Sister     Social History Social History  Substance Use Topics  . Smoking status: Former Smoker    Types: Cigarettes    Quit date: 08/04/1999  . Smokeless tobacco: Never Used  . Alcohol use No     Allergies   Lactose intolerance (gi)   Review of Systems Review of Systems  Constitutional: Negative for chills and fever.  Respiratory: Negative for shortness of breath.   Cardiovascular: Negative for chest pain.  Gastrointestinal: Negative for nausea and vomiting.  Musculoskeletal: Positive for myalgias (left lower leg pain). Negative for arthralgias and joint swelling.  Neurological: Negative for weakness and numbness.  All other systems reviewed and are negative.    Physical Exam Updated Vital Signs BP 190/66 (BP Location: Right Arm)   Pulse 69   Temp 98.6 F (37 C) (Oral)   Resp 20   Ht 5\' 6"  (1.676 m)   Wt 77.1 kg   SpO2 100%   BMI 27.44 kg/m   Physical Exam  Constitutional: She appears well-developed and well-nourished. No distress.  HENT:  Head:  Normocephalic and atraumatic.  Eyes: Conjunctivae are normal.  Neck: Neck supple.  Cardiovascular: Normal rate, regular rhythm, normal heart sounds and intact distal pulses.   Pulmonary/Chest: Effort normal and breath sounds normal. No respiratory distress.  Abdominal: Soft. There is no tenderness. There is no guarding.  Musculoskeletal: She exhibits no edema or tenderness.  Graft noted in the left upper leg. Patient's left lower leg is tender to the touch especially on the posterior side. No discernible swelling, erythema, or increased warmth. Peripheral pulses intact. Motor intact.  Lymphadenopathy:    She has no cervical adenopathy.  Neurological: She is alert.  No sensory deficits bilateral lower extremities. Strength 5 out of 5 bilaterally.  Skin: Skin is warm and dry. She is not diaphoretic.  Psychiatric: She has a normal mood and affect. Her behavior is normal.  Nursing note and vitals reviewed.    ED Treatments / Results  Labs (all labs ordered are listed, but only abnormal results are displayed) Labs Reviewed  BASIC METABOLIC PANEL - Abnormal; Notable for the following:       Result Value   Chloride 92 (*)    Glucose, Bld 146 (*)    BUN 44 (*)    Creatinine, Ser 6.98 (*)    GFR calc non Af Amer 5 (*)    GFR calc Af Amer 6 (*)    Anion gap 16 (*)    All other components within normal limits  CBC WITH DIFFERENTIAL/PLATELET - Abnormal; Notable for the following:    RBC 3.80 (*)    Hemoglobin 10.8 (*)    MCHC 29.2 (*)    RDW 22.3 (*)    Platelets 601 (*)    All other components within normal limits  CBG MONITORING, ED - Abnormal; Notable for the following:    Glucose-Capillary 159 (*)    All other components within normal limits    Hemoglobin  Date Value Ref Range Status  08/26/2016  10.8 (L) 12.0 - 15.0 g/dL Final  40/98/1191 47.8 (L) 12.0 - 15.0 g/dL Final  29/56/2130 9.6 (L) 12.0 - 15.0 g/dL Final  86/57/8469 62.9 (L) 12.0 - 15.0 g/dL Final    EKG  EKG  Interpretation None       Radiology No results found.  Procedures Procedures (including critical care time)  Medications Ordered in ED Medications  oxyCODONE-acetaminophen (PERCOCET/ROXICET) 5-325 MG per tablet 1 tablet (1 tablet Oral Given 08/26/16 1013)  methocarbamol (ROBAXIN) tablet 500 mg (500 mg Oral Given 08/26/16 1438)     Initial Impression / Assessment and Plan / ED Course  I have reviewed the triage vital signs and the nursing notes.  Pertinent labs & imaging results that were available during my care of the patient were reviewed by me and considered in my medical decision making (see chart for details).  Clinical Course    Patient presents with left lower leg pain for the past 3 days. No DVT on ultrasound. Graft appears patent. Patient voices improvement with Robaxin. When asked about her hypertension, patient states that she has not yet taken her blood pressure medications prior to arrival in the ED. Anemia consistent with previous values. Patient to follow up with PCP on this matter. Return precautions discussed.  Findings and plan of care discussed with Pricilla Loveless, MD. Dr. Criss Alvine personally evaluated and examined this patient.    Vitals:   08/26/16 1345 08/26/16 1400 08/26/16 1511 08/26/16 1515  BP: 197/66 188/69 (!) 205/142 178/79  Pulse: 68 75 80 77  Resp:   (!) 28   Temp:      TempSrc:      SpO2: 100% 99% 100% 100%  Weight:      Height:          Final Clinical Impressions(s) / ED Diagnoses   Final diagnoses:  Left leg pain    New Prescriptions Discharge Medication List as of 08/26/2016  3:22 PM    START taking these medications   Details  cyclobenzaprine (FLEXERIL) 5 MG tablet Take 1 tablet (5 mg total) by mouth 2 (two) times daily as needed for muscle spasms., Starting Mon 08/26/2016, Print    !! oxyCODONE-acetaminophen (PERCOCET/ROXICET) 5-325 MG tablet Take 1-2 tablets by mouth every 6 (six) hours as needed for severe pain., Starting  Mon 08/26/2016, Print     !! - Potential duplicate medications found. Please discuss with provider.       Anselm Pancoast, PA-C 08/26/16 1758    Pricilla Loveless, MD 08/27/16 1721

## 2016-08-26 NOTE — Progress Notes (Signed)
VASCULAR LAB PRELIMINARY  PRELIMINARY  PRELIMINARY  PRELIMINARY  Left lower extremity venous duplex completed.    Preliminary report:  There is no DVT or SVT noted in the left lower extremity.  Dialysis access looks to be WNL.   Called report to Myer PeerShawn Joy, PA-C  [facility] HospitalKANADY, Kanai Hilger, RVT 08/26/2016, 2:34 PM

## 2016-08-28 ENCOUNTER — Emergency Department (HOSPITAL_COMMUNITY)
Admission: EM | Admit: 2016-08-28 | Discharge: 2016-08-28 | Disposition: A | Discharge status: Home / Self Care | Payer: Medicare HMO | Attending: Emergency Medicine | Admitting: Emergency Medicine

## 2016-08-28 ENCOUNTER — Encounter (HOSPITAL_COMMUNITY): Payer: Self-pay | Admitting: *Deleted

## 2016-08-28 DIAGNOSIS — Z992 Dependence on renal dialysis: Secondary | ICD-10-CM | POA: Diagnosis not present

## 2016-08-28 DIAGNOSIS — E1122 Type 2 diabetes mellitus with diabetic chronic kidney disease: Secondary | ICD-10-CM | POA: Diagnosis not present

## 2016-08-28 DIAGNOSIS — Z87891 Personal history of nicotine dependence: Secondary | ICD-10-CM | POA: Diagnosis not present

## 2016-08-28 DIAGNOSIS — E11319 Type 2 diabetes mellitus with unspecified diabetic retinopathy without macular edema: Secondary | ICD-10-CM | POA: Diagnosis not present

## 2016-08-28 DIAGNOSIS — M79662 Pain in left lower leg: Secondary | ICD-10-CM | POA: Diagnosis present

## 2016-08-28 DIAGNOSIS — Z7982 Long term (current) use of aspirin: Secondary | ICD-10-CM | POA: Insufficient documentation

## 2016-08-28 DIAGNOSIS — I2510 Atherosclerotic heart disease of native coronary artery without angina pectoris: Secondary | ICD-10-CM | POA: Insufficient documentation

## 2016-08-28 DIAGNOSIS — I120 Hypertensive chronic kidney disease with stage 5 chronic kidney disease or end stage renal disease: Secondary | ICD-10-CM | POA: Insufficient documentation

## 2016-08-28 DIAGNOSIS — Z794 Long term (current) use of insulin: Secondary | ICD-10-CM | POA: Insufficient documentation

## 2016-08-28 DIAGNOSIS — N186 End stage renal disease: Secondary | ICD-10-CM | POA: Insufficient documentation

## 2016-08-28 DIAGNOSIS — R52 Pain, unspecified: Secondary | ICD-10-CM

## 2016-08-28 LAB — CBG MONITORING, ED: Glucose-Capillary: 151 mg/dL — ABNORMAL HIGH (ref 65–99)

## 2016-08-28 MED ORDER — LORAZEPAM 2 MG/ML IJ SOLN
1.0000 mg | Freq: Once | INTRAMUSCULAR | Status: AC
  Administered 2016-08-28: 1 mg via INTRAMUSCULAR
  Filled 2016-08-28: qty 1

## 2016-08-28 MED ORDER — OXYCODONE-ACETAMINOPHEN 5-325 MG PO TABS: 1.0000 | ORAL_TABLET | ORAL | 0 refills | Status: DC | PRN

## 2016-08-28 MED ORDER — DIAZEPAM 5 MG PO TABS: 2.5000 mg | ORAL_TABLET | Freq: Four times a day (QID) | ORAL | 0 refills | Status: DC | PRN

## 2016-08-28 MED ORDER — HYDROMORPHONE HCL 1 MG/ML IJ SOLN
1.0000 mg | Freq: Once | INTRAMUSCULAR | Status: AC
  Administered 2016-08-28: 1 mg via INTRAMUSCULAR
  Filled 2016-08-28: qty 1

## 2016-08-28 MED ORDER — DEXAMETHASONE 4 MG PO TABS
8.0000 mg | ORAL_TABLET | Freq: Once | ORAL | Status: AC
  Administered 2016-08-28: 8 mg via ORAL
  Filled 2016-08-28: qty 2

## 2016-08-28 MED ORDER — HALOPERIDOL LACTATE 5 MG/ML IJ SOLN
3.0000 mg | Freq: Once | INTRAMUSCULAR | Status: AC
  Administered 2016-08-28: 3 mg via INTRAMUSCULAR
  Filled 2016-08-28: qty 1

## 2016-08-28 NOTE — ED Triage Notes (Addendum)
Pt reports being seen on Monday for left lower leg pain, had negative doppler. Now reports pain is to her leg and entire left side of body. Having difficulty ambulating due to pain. Pt is a dialysis pt.

## 2016-08-28 NOTE — ED Provider Notes (Signed)
[facility]-EM[facility] DEPT Provider Note   CSN: [id number] Arrival date & time: 08/28/16  0757     History   Chief Complaint Chief Complaint  Patient presents with  . Leg Pain    HPI Colleen Mullins is a 70 y.o. female.  HPI  70 year old female with left-sided body pain. Patient was seen on Monday in the emergency room for left lower extremity pain. She reports that the pain was initially in her left foot extending up into her left shin. Her evaluation included a left lower extremity venous Doppler which was negative for DVT. She was prescribed Flexeril and Percocet to take as needed. She reports that she has taken these with normal improvement. If anything, the pain is actually worsened and progressed to include her entire left side. She describes sharp shooting pain in her left lower extremity left flank, left chest left shoulder and left lateral neck. She reports it feels like sharp pain and muscle cramps. Intermittent. No appreciable exacerbating relieving factors. She denies any acute swelling. No rash. She does have end-stage renal disease and will have been on dialysis for 6 years on October 11th. She has had vascular surgery to her left femoral region, but reports that she has not had this accessed and approximately 6 months. Last dialysis was yesterday also.Marland Kitchen  Past Medical History:  Diagnosis Date  . Anemia    low iron  . Arthritis   . CAD in native artery 08/12/2016   Nonobstructive ASCAD by cath with 60% D1 and 50% PL off RCA and 25% mid LAD.  . Diabetes mellitus    type 2  . Diabetic retinopathy (HCC)   . Dialysis patient [facility])    M-W-F @ [facility]  . Ejection fraction   . ESRD on hemodialysis [facility])    Dialysis T/Th/Sa  . GERD (gastroesophageal reflux disease)   . Glaucoma   . H/O: Bell's palsy 2007  . Hyperlipidemia   . Hypertension    a. labile - hx of BP dropping at HD.  . Macular degeneration   . Shortness of breath dyspnea    with walking    Patient Active  Problem List   Diagnosis Date Noted  . CAD in native artery 08/12/2016  . Abnormal nuclear stress test 07/10/2016  . Chest pain   . Anemia, unspecified 07/08/2016  . Pain in the chest 07/07/2016  . Dyspnea on exertion 04/14/2016  . SOB (shortness of breath) 04/14/2016  . ESRD on dialysis (HCC) 04/14/2016  . Benign essential HTN 04/14/2016  . Hyperlipidemia 04/14/2016  . 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 Hypoglycemia 10/16/2015  . Elevated troponin 10/16/2015  . Ejection fraction   . Type 2 diabetes mellitus with renal complication (HCC) 09/26/2011  . Other complications due to renal dialysis device, implant, and graft 09/26/2011  . End stage renal disease (HCC) 09/26/2011    Past Surgical History:  Procedure Laterality Date  . ABDOMINAL HYSTERECTOMY    . ARTERIOVENOUS GRAFT PLACEMENT     Left forearm x 2, one removed in March  . ARTERIOVENOUS GRAFT PLACEMENT     Left forearm  . AVGG REMOVAL  10/08/2011   Procedure: REMOVAL OF ARTERIOVENOUS GORETEX GRAFT (AVGG);  Surgeon: Sherren Kerns, MD;  Location: [facility];  Service: Vascular;  Laterality: Left;  . BTL    . CARDIAC CATHETERIZATION N/A 07/10/2016   Procedure: Left Heart Cath and Coronary Angiography;  Surgeon: Yvonne Kendall, MD;  Location: [facility] INVASIVE CV LAB;  Service: Cardiovascular;  Laterality:  N/A;  . COLONOSCOPY    . EXCHANGE OF A DIALYSIS CATHETER Right 03/27/2016   Procedure: EXCHANGE OF A DIALYSIS CATHETER;  Surgeon: Fransisco HertzBrian L Chen, MD;  [facility];  Service: Vascular;  Laterality: Right;  . EYE SURGERY Left    Lasik surgery on left. Cataract surgery on both eyes  . LAPAROTOMY  ?2000   Pelvic mass (Benign)  . REVISION OF ARTERIOVENOUS GORETEX GRAFT Left 03/27/2016   Procedure: REVISION OF LEFT ARTERIOVENOUS GORETEX GRAFT;  Surgeon: Fransisco HertzBrian L Chen, MD;  [facility];  Service: Vascular;  Laterality: Left;  . TONSILLECTOMY      OB History    No data available       Home Medications      Prior to Admission medications   Medication Sig Start Date End Date Taking? Authorizing Provider  acetaminophen (TYLENOL) 325 MG tablet Take 650 mg by mouth every 6 (six) hours as needed for moderate pain.    Historical Provider, MD  amLODipine (NORVASC) 10 MG tablet Take 10 mg by mouth at bedtime.     Historical Provider, MD  aspirin EC 81 MG tablet Take 81 mg by mouth at bedtime.     Historical Provider, MD  aspirin-acetaminophen-caffeine (EXCEDRIN MIGRAINE) [phone] MG tablet Take 2 tablets by mouth every 6 (six) hours as needed for headache.    Historical Provider, MD  bisacodyl (DULCOLAX) 5 MG EC tablet Take 5 mg by mouth daily as needed for moderate constipation.    Historical Provider, MD  bismuth subsalicylate (KAOPECTATE) 262 MG/15ML suspension Take 30 mLs by mouth every 6 (six) hours as needed for indigestion or diarrhea or loose stools.    Historical Provider, MD  calcium acetate (PHOSLO) 667 MG capsule Take 1,334 mg by mouth 3 (three) times daily with meals. Takes 2 caps with meals and 1 cap with snacks    Historical Provider, MD  cinacalcet (SENSIPAR) 60 MG tablet Take 60 mg by mouth 2 (two) times a week.     Historical Provider, MD  cyclobenzaprine (FLEXERIL) 5 MG tablet Take 1 tablet (5 mg total) by mouth 2 (two) times daily as needed for muscle spasms. 08/26/16   Shawn C Joy, PA-C  fenofibrate (TRICOR) 145 MG tablet Take 145 mg by mouth every evening.     Historical Provider, MD  fish oil-omega-3 fatty acids 1000 MG capsule Take 1 g by mouth at bedtime.     Historical Provider, MD  insulin aspart (NOVOLOG) 100 UNIT/ML injection Inject 0-9 Units into the skin 3 (three) times daily with meals. 10/18/15   Jeralyn BennettEzequiel Zamora, MD  isosorbide mononitrate (IMDUR) 30 MG 24 hr tablet Take 0.5 tablets (15 mg total) by mouth daily. 08/12/16 11/10/16  Quintella Reichertraci R Turner, MD  labetalol (NORMODYNE) 200 MG tablet Take 1 tablet (200 mg total) by mouth 2 (two) times daily. 07/11/16   Thomasene LotJames Taylor, MD   losartan (COZAAR) 100 MG tablet Take 100 mg by mouth every evening.    Historical Provider, MD  multivitamin (RENA-VIT) TABS tablet Take 1 tablet by mouth every evening.     Historical Provider, MD  Naphazoline HCl (CLEAR EYES OP) Place 1 drop into both eyes as needed (for dry eyes).    Historical Provider, MD  oxyCODONE-acetaminophen (PERCOCET/ROXICET) 5-325 MG tablet Take 1 tablet by mouth every 4 (four) hours as needed for severe pain.    Historical Provider, MD  oxyCODONE-acetaminophen (PERCOCET/ROXICET) 5-325 MG tablet Take 1-2 tablets by mouth every 6 (six) hours as needed for  severe pain. 08/26/16   Shawn C Joy, PA-C  RaNITidine HCl (ACID REDUCER PO) Take 1 tablet by mouth as needed (for acid reflux).    Historical Provider, MD  Travoprost, BAK Free, (TRAVATAN) 0.004 % SOLN ophthalmic solution Place 1 drop into both eyes at bedtime.    Historical Provider, MD    Family History Family History  Problem Relation Age of Onset  . Diabetes Mother   . Heart disease Mother   . COPD Father   . Hypertension Father   . Lung disease Father   . Diabetes Father   . Diabetes Sister   . Breast cancer Sister     Social History Social History  Substance Use Topics  . Smoking status: Former Smoker    Types: Cigarettes    Quit date: 08/04/1999  . Smokeless tobacco: Never Used  . Alcohol use No     Allergies   Lactose intolerance (gi)   Review of Systems Review of Systems  All systems reviewed and negative, other than as noted in HPI.   Physical Exam Updated Vital Signs BP 162/87 (BP Location: Left Arm)   Pulse 75   Temp 97.9 F (36.6 C) (Oral)   Resp 17   Ht 5\' 6"  (1.676 m)   Wt 170 lb (77.1 kg)   SpO2 100%   BMI 27.44 kg/m   Physical Exam  Constitutional: She appears well-developed and well-nourished. No distress.  HENT:  Head: Normocephalic and atraumatic.  Eyes: Conjunctivae are normal. Right eye exhibits no discharge. Left eye exhibits no discharge.  Neck: Neck  supple.  Cardiovascular: Normal rate, regular rhythm and normal heart sounds.  Exam reveals no gallop and no friction rub.   No murmur heard. Pulmonary/Chest: Effort normal and breath sounds normal. No respiratory distress.  Abdominal: Soft. She exhibits no distension. There is no tenderness.  Musculoskeletal: She exhibits no edema or tenderness.  Left lower extremity is grossly normal in appearance and symmetric as compared to the right. No concerning skin lesions noted. Toes/feet feel warm. Brisk cap refill.. No swelling. No tenderness to palpation. Surgical changes palpated in the left femoral region. Palpable thrill.  Neurological: She is alert.  Skin: Skin is warm and dry.  Psychiatric: She has a normal mood and affect. Her behavior is normal. Thought content normal.  Nursing note and vitals reviewed.    ED Treatments / Results  Labs (all labs ordered are listed, but only abnormal results are displayed) Labs Reviewed - No data to display  EKG  EKG Interpretation None       Radiology No results found.  Procedures Procedures (including critical care time)  Medications Ordered in ED Medications  HYDROmorphone (DILAUDID) injection 1 mg (not administered)  dexamethasone (DECADRON) tablet 8 mg (not administered)  LORazepam (ATIVAN) injection 1 mg (not administered)     Initial Impression / Assessment and Plan / ED Course  I have reviewed the triage vital signs and the nursing notes.  Pertinent labs & imaging results that were available during my care of the patient were reviewed by me and considered in my medical decision making (see chart for details).  Clinical Course    70 year old female with left-sided body pain. Somewhat unusual in a pain extends from her left lateral neck probably down into her left foot. She seems to get paroxysms of pain and would occasionally wince/jump while I was in the room. Her physical exam is fairly unremarkable. No trauma. Doesn't  appear to be a vascular issue and  progression of symptoms to essentially include her entire L side doesn't support this either. She is neurologically intact aside from mild facial asymmetry which is apparently chronic. . I wouldn't expect a significant electrolyte abnormality to only give unilateral symptoms. Not sure as to the exact etiology of her symptoms, but I have a low suspicion for emergent process. Will treat symptomatically.  Final Clinical Impressions(s) / ED Diagnoses   Final diagnoses:  Pain of left side of body    New Prescriptions New Prescriptions   No medications on file     Raeford Razor, MD 09/01/16 1055

## 2016-08-30 ENCOUNTER — Encounter (HOSPITAL_COMMUNITY): Payer: Self-pay | Admitting: Vascular Surgery

## 2016-08-30 ENCOUNTER — Emergency Department (HOSPITAL_BASED_OUTPATIENT_CLINIC_OR_DEPARTMENT_OTHER): Admit: 2016-08-30 | Discharge: 2016-08-30 | Disposition: A | Discharge status: Home / Self Care | Payer: Medicare HMO

## 2016-08-30 ENCOUNTER — Emergency Department (HOSPITAL_COMMUNITY)
Admission: EM | Admit: 2016-08-30 | Discharge: 2016-08-30 | Disposition: A | Discharge status: Home / Self Care | Payer: Medicare HMO | Attending: Emergency Medicine | Admitting: Emergency Medicine

## 2016-08-30 DIAGNOSIS — M79605 Pain in left leg: Secondary | ICD-10-CM

## 2016-08-30 DIAGNOSIS — M79609 Pain in unspecified limb: Secondary | ICD-10-CM | POA: Diagnosis not present

## 2016-08-30 DIAGNOSIS — Z7982 Long term (current) use of aspirin: Secondary | ICD-10-CM | POA: Diagnosis not present

## 2016-08-30 DIAGNOSIS — Z87891 Personal history of nicotine dependence: Secondary | ICD-10-CM | POA: Diagnosis not present

## 2016-08-30 DIAGNOSIS — E11319 Type 2 diabetes mellitus with unspecified diabetic retinopathy without macular edema: Secondary | ICD-10-CM | POA: Insufficient documentation

## 2016-08-30 DIAGNOSIS — I120 Hypertensive chronic kidney disease with stage 5 chronic kidney disease or end stage renal disease: Secondary | ICD-10-CM | POA: Diagnosis not present

## 2016-08-30 DIAGNOSIS — N186 End stage renal disease: Secondary | ICD-10-CM | POA: Diagnosis not present

## 2016-08-30 DIAGNOSIS — I2510 Atherosclerotic heart disease of native coronary artery without angina pectoris: Secondary | ICD-10-CM | POA: Diagnosis not present

## 2016-08-30 DIAGNOSIS — Z794 Long term (current) use of insulin: Secondary | ICD-10-CM | POA: Insufficient documentation

## 2016-08-30 DIAGNOSIS — Z79899 Other long term (current) drug therapy: Secondary | ICD-10-CM | POA: Diagnosis not present

## 2016-08-30 MED ORDER — OXYCODONE-ACETAMINOPHEN 5-325 MG PO TABS
1.0000 | ORAL_TABLET | Freq: Once | ORAL | Status: AC
  Administered 2016-08-30: 1 via ORAL
  Filled 2016-08-30: qty 1

## 2016-08-30 MED ORDER — HYDROMORPHONE HCL 1 MG/ML IJ SOLN
1.0000 mg | Freq: Once | INTRAMUSCULAR | Status: AC
  Administered 2016-08-30: 1 mg via INTRAMUSCULAR
  Filled 2016-08-30: qty 1

## 2016-08-30 MED ORDER — GABAPENTIN 100 MG PO CAPS: 100.0000 mg | ORAL_CAPSULE | Freq: Three times a day (TID) | ORAL | 0 refills | Status: DC

## 2016-08-30 NOTE — ED Notes (Signed)
Pt brought back to room able to take a few steps in to the bed c/o severe pain

## 2016-08-30 NOTE — ED Triage Notes (Signed)
Pt reports to the ED for eval of left leg pain. She reports the pain started on Saturday and she has been here 3 times for the pain. She has been taking percocet and muscle relaxer without relief. Pt denies any relieving factors. Ambulation and lying down makes the pain worse. Pt had a U/S done. Denies any recent injury. She is a dialysis and had her full tx.

## 2016-08-30 NOTE — ED Provider Notes (Signed)
[facility] Provider Note   CSN: [id number] Arrival date & time: 08/30/16  1046  History   Chief Complaint Chief Complaint  Patient presents with  . Leg Pain    HPI Colleen Mullins is a 70 y.o. female.  HPI  70 y.o. female with a hx of DM, CAD, ESRD on Dialysis, HTN, presents to the Emergency Department today complaining of left leg pain since Saturday. Seen in ED x 2 for same. Unremarkable work ups at that time. Pt states that pain was originally on the left anterior foot and migrated up. Notes sharp/shooting sensations intermittently without provocation. Notes pain 10/10. Flexeril and Percocet with minimal relief. DVT imaging unremarkable. No CP/SOB/ABD pain. No headaches. No N/V/D. No numbness/tingling. No fevers. Pt is on Dialysis and has completed treatment. She is T/R/Sat. Pt states they used port in chest for dialysis instead of femoral graft in affected leg. No erythema in area. Pt does not being tender with light touch. No other symptoms noted.   Past Medical History:  Diagnosis Date  . Anemia    low iron  . Arthritis   . CAD in native artery 08/12/2016   Nonobstructive ASCAD by cath with 60% D1 and 50% PL off RCA and 25% mid LAD.  . Diabetes mellitus    type 2  . Diabetic retinopathy (HCC)   . Dialysis patient [facility])    M-W-F @ [facility]  . Ejection fraction   . ESRD on hemodialysis [facility])    Dialysis T/Th/Sa  . GERD (gastroesophageal reflux disease)   . Glaucoma   . H/O: Bell's palsy 2007  . Hyperlipidemia   . Hypertension    a. labile - hx of BP dropping at HD.  . Macular degeneration   . Shortness of breath dyspnea    with walking    Patient Active Problem List   Diagnosis Date Noted  . CAD in native artery 08/12/2016  . Abnormal nuclear stress test 07/10/2016  . Chest pain   . Anemia, unspecified 07/08/2016  . Pain in the chest 07/07/2016  . Dyspnea on exertion 04/14/2016  . SOB (shortness of breath) 04/14/2016  . ESRD on dialysis (HCC) 04/14/2016   . Benign essential HTN 04/14/2016  . Hyperlipidemia 04/14/2016  . 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 Hypoglycemia 10/16/2015  . Elevated troponin 10/16/2015  . Ejection fraction   . Type 2 diabetes mellitus with renal complication (HCC) 09/26/2011  . Other complications due to renal dialysis device, implant, and graft 09/26/2011  . End stage renal disease (HCC) 09/26/2011    Past Surgical History:  Procedure Laterality Date  . ABDOMINAL HYSTERECTOMY    . ARTERIOVENOUS GRAFT PLACEMENT     Left forearm x 2, one removed in March  . ARTERIOVENOUS GRAFT PLACEMENT     Left forearm  . AVGG REMOVAL  10/08/2011   Procedure: REMOVAL OF ARTERIOVENOUS GORETEX GRAFT (AVGG);  Surgeon: Sherren Kerns, MD;  Location: [facility] OR;  Service: Vascular;  Laterality: Left;  . BTL    . CARDIAC CATHETERIZATION N/A 07/10/2016   Procedure: Left Heart Cath and Coronary Angiography;  Surgeon: Yvonne Kendall, MD;  [facility] INVASIVE CV LAB;  Service: Cardiovascular;  Laterality: N/A;  . COLONOSCOPY    . EXCHANGE OF A DIALYSIS CATHETER Right 03/27/2016   Procedure: EXCHANGE OF A DIALYSIS CATHETER;  Surgeon: Fransisco Hertz, MD;  Location: [facility] OR;  Service: Vascular;  Laterality: Right;  . EYE SURGERY Left    Lasik surgery on left. Cataract  surgery on both eyes  . LAPAROTOMY  ?2000   Pelvic mass (Benign)  . REVISION OF ARTERIOVENOUS GORETEX GRAFT Left 03/27/2016   Procedure: REVISION OF LEFT ARTERIOVENOUS GORETEX GRAFT;  Surgeon: Fransisco HertzBrian L Chen, MD;  [facility];  Service: Vascular;  Laterality: Left;  . TONSILLECTOMY      OB History    No data available       Home Medications    Prior to Admission medications   Medication Sig Start Date End Date Taking? Authorizing Provider  acetaminophen (TYLENOL) 325 MG tablet Take 650 mg by mouth every 6 (six) hours as needed for moderate pain.    Historical Provider, MD  amLODipine (NORVASC) 10 MG tablet Take 10 mg by mouth at bedtime.      Historical Provider, MD  aspirin EC 81 MG tablet Take 81 mg by mouth at bedtime.     Historical Provider, MD  aspirin-acetaminophen-caffeine (EXCEDRIN MIGRAINE) [phone] MG tablet Take 2 tablets by mouth every 6 (six) hours as needed for headache.    Historical Provider, MD  bisacodyl (DULCOLAX) 5 MG EC tablet Take 5 mg by mouth daily as needed for moderate constipation.    Historical Provider, MD  bismuth subsalicylate (KAOPECTATE) 262 MG/15ML suspension Take 30 mLs by mouth every 6 (six) hours as needed for indigestion or diarrhea or loose stools.    Historical Provider, MD  calcium acetate (PHOSLO) 667 MG capsule Take 1,334 mg by mouth 3 (three) times daily with meals. Takes 2 caps with meals and 1 cap with snacks    Historical Provider, MD  cinacalcet (SENSIPAR) 60 MG tablet Take 60 mg by mouth 2 (two) times a week.     Historical Provider, MD  cyclobenzaprine (FLEXERIL) 5 MG tablet Take 1 tablet (5 mg total) by mouth 2 (two) times daily as needed for muscle spasms. 08/26/16   Shawn C Joy, PA-C  diazepam (VALIUM) 5 MG tablet Take 0.5 tablets (2.5 mg total) by mouth every 6 (six) hours as needed for muscle spasms. 08/28/16   Raeford RazorStephen Kohut, MD  fenofibrate (TRICOR) 145 MG tablet Take 145 mg by mouth every evening.     Historical Provider, MD  fish oil-omega-3 fatty acids 1000 MG capsule Take 1 g by mouth at bedtime.     Historical Provider, MD  insulin aspart (NOVOLOG) 100 UNIT/ML injection Inject 0-9 Units into the skin 3 (three) times daily with meals. Patient taking differently: Inject 0-9 Units into the skin 3 (three) times daily with meals. Per sliding scale 10/18/15   Jeralyn BennettEzequiel Zamora, MD  isosorbide mononitrate (IMDUR) 30 MG 24 hr tablet Take 0.5 tablets (15 mg total) by mouth daily. 08/12/16 11/10/16  Quintella Reichertraci R Turner, MD  labetalol (NORMODYNE) 200 MG tablet Take 1 tablet (200 mg total) by mouth 2 (two) times daily. Patient taking differently: Take 300 mg by mouth 2 (two) times daily.  07/11/16    Thomasene LotJames Taylor, MD  losartan (COZAAR) 100 MG tablet Take 100 mg by mouth every evening.    Historical Provider, MD  multivitamin (RENA-VIT) TABS tablet Take 1 tablet by mouth every evening.     Historical Provider, MD  Naphazoline HCl (CLEAR EYES OP) Place 1 drop into both eyes as needed (for dry eyes).    Historical Provider, MD  oxyCODONE-acetaminophen (PERCOCET/ROXICET) 5-325 MG tablet Take 1-2 tablets by mouth every 6 (six) hours as needed for severe pain. 08/26/16   Shawn C Joy, PA-C  oxyCODONE-acetaminophen (PERCOCET/ROXICET) 5-325 MG tablet Take 1-2 tablets by  mouth every 4 (four) hours as needed for severe pain. 08/28/16   Raeford Razor, MD  RaNITidine HCl (ACID REDUCER PO) Take 1 tablet by mouth as needed (for acid reflux).    Historical Provider, MD  Travoprost, BAK Free, (TRAVATAN) 0.004 % SOLN ophthalmic solution Place 1 drop into both eyes at bedtime.    Historical Provider, MD  VOLTAREN 1 % GEL Apply 1 application topically as needed. 07/12/16   Historical Provider, MD    Family History Family History  Problem Relation Age of Onset  . Diabetes Mother   . Heart disease Mother   . COPD Father   . Hypertension Father   . Lung disease Father   . Diabetes Father   . Diabetes Sister   . Breast cancer Sister     Social History Social History  Substance Use Topics  . Smoking status: Former Smoker    Types: Cigarettes    Quit date: 08/04/1999  . Smokeless tobacco: Never Used  . Alcohol use No     Allergies   Lactose intolerance (gi)   Review of Systems Review of Systems ROS reviewed and all are negative for acute change except as noted in the HPI.  Physical Exam Updated Vital Signs BP 146/67 (BP Location: Left Arm)   Pulse 87   Temp 98.5 F (36.9 C) (Oral)   Resp 16   SpO2 97%   Physical Exam  Constitutional: She is oriented to person, place, and time. Vital signs are normal. She appears well-developed and well-nourished.  HENT:  Head: Normocephalic.  Right Ear:  Hearing normal.  Left Ear: Hearing normal.  Eyes: Conjunctivae and EOM are normal. Pupils are equal, round, and reactive to light.  Neck: Normal range of motion. Neck supple.  Cardiovascular: Normal rate, regular rhythm, normal heart sounds and intact distal pulses.   Pulmonary/Chest: Effort normal and breath sounds normal.  Abdominal: Soft. Bowel sounds are normal.  Musculoskeletal: Normal range of motion.  LLE with motor/sensation intact. Distal pulses appreciated. Pt TTP with light touch on anterior aspect of left foot. No visible erythema or swelling. ROM intact. Pain with ambulation.  Neurological: She is alert and oriented to person, place, and time.  Skin: Skin is warm and dry.  Psychiatric: She has a normal mood and affect. Her speech is normal and behavior is normal. Thought content normal.  Nursing note and vitals reviewed.  ED Treatments / Results  Labs (all labs ordered are listed, but only abnormal results are displayed) Labs Reviewed - No data to display  EKG  EKG Interpretation None       Radiology No results found.  Procedures Procedures (including critical care time)  Medications Ordered in ED Medications - No data to display   Initial Impression / Assessment and Plan / ED Course  I have reviewed the triage vital signs and the nursing notes.  Pertinent labs & imaging results that were available during my care of the patient were reviewed by me and considered in my medical decision making (see chart for details).  Clinical Course    Final Clinical Impressions(s) / ED Diagnoses  I have reviewed and evaluated the relevant laboratory values I have reviewed and evaluated the relevant imaging studies. I have reviewed the relevant previous healthcare records.I obtained HPI from historian. Patient discussed with supervising physician  ED Course:  Assessment: Pt is a 70yF with hx DM, CAD, ESRD on Dialysis, HTN who presents with left leg pain since Saturday.  Seen in ED x2  for same. On exam, pt in NAD. Nontoxic/nonseptic appearing. VSS. Afebrile. Lungs CTA. Heart RRR. TTP with light touch over anterior aspect of left leg. No calf tenderness. Neg homans. No visible palpable deformities. NVI. Distal pulses appreciated. Do not suspect any acute abnormalities. Labs unremarkable previously. Unsure of etiology, but low suspicion for emergent condition. Discussed with supervising physician. Will obtain Arterial doppler. If unremarkable, will trial with Gabapentin and follow up with PCP at scheduled appointment on Wednesday. Plan is to DC Home with follow up to PCP at scheduled appointment. At time of discharge, Patient is in no acute distress. Vital Signs are stable. Patient is able to ambulate. Patient able to tolerate PO.    Disposition/Plan:  4:48 PM Sign out to Santiago Glad, PA-C   Supervising Physician Lyndal Pulley, MD   Final diagnoses:  Left leg pain    New Prescriptions New Prescriptions   No medications on file     Audry Pili, PA-C 08/30/16 1648    Lyndal Pulley, MD 08/31/16 1036    Lyndal Pulley, MD 08/31/16 1039

## 2016-08-30 NOTE — Discharge Instructions (Signed)
Please read and follow all provided instructions.  Your diagnoses today include:  1. Left leg pain    Tests performed today include: Vital signs. See below for your results today.   Medications prescribed:  Take as prescribed   Home care instructions:  Follow any educational materials contained in this packet.  Follow-up instructions: Please follow-up with your primary care provider for further evaluation of symptoms and treatment   Return instructions:  Please return to the Emergency Department if you do not get better, if you get worse, or new symptoms OR  - Fever (temperature greater than 101.31F)  - Bleeding that does not stop with holding pressure to the area    -Severe pain (please note that you may be more sore the day after your accident)  - Chest Pain  - Difficulty breathing  - Severe nausea or vomiting  - Inability to tolerate food and liquids  - Passing out  - Skin becoming red around your wounds  - Change in mental status (confusion or lethargy)  - New numbness or weakness    Please return if you have any other emergent concerns.  Additional Information:  Your vital signs today were: BP 146/67 (BP Location: Left Arm)    Pulse 87    Temp 98.5 F (36.9 C) (Oral)    Resp 16    SpO2 97%  If your blood pressure (BP) was elevated above 135/85 this visit, please have this repeated by your doctor within one month. ---------------

## 2016-08-30 NOTE — Progress Notes (Addendum)
*  PRELIMINARY RESULTS* Vascular Ultrasound Duplex Dialysis Access (AVF, AGV) has been completed.  Preliminary findings: Left femoral HD graft appears patent. Small hematoma noted surrounding graft near arterial anastomosis.   ABI completed: Right ABI indicates moderate arterial disease. Left ABI is within normal limits. Atypical left pedal waveforms may be due to graft in left leg.    RIGHT    LEFT    PRESSURE WAVEFORM  PRESSURE WAVEFORM  BRACHIAL 191 Tri BRACHIAL 194 Tri  AT 95 Mono  AT 191 Mono   PT 110 Mono  PT 196 Mono     RIGHT LEFT  ABI 0.57 1.01    Called results to [place], [place] in ED.    Farrel DemarkJill Eunice, RDMS, RVT  08/30/2016, 5:50 PM

## 2016-08-30 NOTE — ED Notes (Signed)
Pt back from US appears comfortable

## 2016-08-30 NOTE — ED Provider Notes (Signed)
Patient signed out to me by Colleen Piliyler Mohr, PA-C at shift change.  Patient with a history of ESRD on dialysis presents today with left leg pain.  She has had a recent doppler ultrasound, which was negative for DVT.  Plan is for her to get an arterial doppler ultrasound.  Plan is for her to go home if negative.  6:03 PM Ultrasound showing a small hematoma surrounding the dialysis graft, but the graft is patent.  ABI showing moderate PAD on the right leg, but not on the left.  Reassessed patient to discuss results of the ultrasound.  She states that they have been using the Catheter in her right chest for dialysis and have not used the dialysis graft in her leg since July.   No signs of cellulitis on exam.  She has full ROM of her left leg.  2+ DP pulse on left.  She reports that the pain is actually located in her left lower leg.  Feel that the patient is stable for discharge.  Return precautions given.   Colleen GladHeather Rashae Rother, PA-C 08/30/16 [id number]    Colleen SproutWhitney Plunkett, MD 08/30/16 2320

## 2016-08-30 NOTE — ED Notes (Signed)
Patient transported to Ultrasound 

## 2016-09-04 ENCOUNTER — Emergency Department (HOSPITAL_COMMUNITY): Payer: Medicare HMO

## 2016-09-04 ENCOUNTER — Emergency Department (HOSPITAL_COMMUNITY)
Admission: EM | Admit: 2016-09-04 | Discharge: 2016-09-04 | Disposition: A | Discharge status: Home / Self Care | Payer: Medicare HMO | Attending: Emergency Medicine | Admitting: Emergency Medicine

## 2016-09-04 ENCOUNTER — Encounter (HOSPITAL_COMMUNITY): Payer: Self-pay | Admitting: Emergency Medicine

## 2016-09-04 DIAGNOSIS — E11319 Type 2 diabetes mellitus with unspecified diabetic retinopathy without macular edema: Secondary | ICD-10-CM | POA: Diagnosis not present

## 2016-09-04 DIAGNOSIS — N186 End stage renal disease: Secondary | ICD-10-CM | POA: Diagnosis not present

## 2016-09-04 DIAGNOSIS — Z794 Long term (current) use of insulin: Secondary | ICD-10-CM | POA: Insufficient documentation

## 2016-09-04 DIAGNOSIS — I2510 Atherosclerotic heart disease of native coronary artery without angina pectoris: Secondary | ICD-10-CM | POA: Diagnosis not present

## 2016-09-04 DIAGNOSIS — Z87891 Personal history of nicotine dependence: Secondary | ICD-10-CM | POA: Diagnosis not present

## 2016-09-04 DIAGNOSIS — Z7982 Long term (current) use of aspirin: Secondary | ICD-10-CM | POA: Diagnosis not present

## 2016-09-04 DIAGNOSIS — E1122 Type 2 diabetes mellitus with diabetic chronic kidney disease: Secondary | ICD-10-CM | POA: Diagnosis not present

## 2016-09-04 DIAGNOSIS — Z992 Dependence on renal dialysis: Secondary | ICD-10-CM | POA: Insufficient documentation

## 2016-09-04 DIAGNOSIS — W010XXA Fall on same level from slipping, tripping and stumbling without subsequent striking against object, initial encounter: Secondary | ICD-10-CM | POA: Insufficient documentation

## 2016-09-04 DIAGNOSIS — Y999 Unspecified external cause status: Secondary | ICD-10-CM | POA: Diagnosis not present

## 2016-09-04 DIAGNOSIS — Y929 Unspecified place or not applicable: Secondary | ICD-10-CM | POA: Insufficient documentation

## 2016-09-04 DIAGNOSIS — M79605 Pain in left leg: Secondary | ICD-10-CM | POA: Diagnosis present

## 2016-09-04 DIAGNOSIS — I120 Hypertensive chronic kidney disease with stage 5 chronic kidney disease or end stage renal disease: Secondary | ICD-10-CM | POA: Diagnosis not present

## 2016-09-04 DIAGNOSIS — W19XXXA Unspecified fall, initial encounter: Secondary | ICD-10-CM

## 2016-09-04 DIAGNOSIS — Y939 Activity, unspecified: Secondary | ICD-10-CM | POA: Insufficient documentation

## 2016-09-04 MED ORDER — HYDROMORPHONE HCL 1 MG/ML IJ SOLN
1.0000 mg | Freq: Once | INTRAMUSCULAR | Status: AC
  Administered 2016-09-04: 1 mg via INTRAMUSCULAR
  Filled 2016-09-04: qty 1

## 2016-09-04 NOTE — ED Provider Notes (Signed)
[facility]-EMERGENCY DEPT Provider Note   CSN: [id number] Arrival date & time: 09/04/16  4098     History   Chief Complaint Chief Complaint  Patient presents with  . Fall  . Foot Pain    HPI Colleen Mullins is a 70 y.o. female who presents with a reported fall and subsequently left foot, ankle, leg pain. PMH significant for ESRD currently on dialysis Tu, Thur, S, DM, GERD, CAD, HTN, HLD, arthritis. She states she was getting out of bed this morning when she lost her balance, tripped, and twisted her ankle. Denies LOC, head or neck injury, chest pain, SOB, abdominal pain. She states she has pain in her foot and ankle which radiates to her L knee. She has a graft in her L thigh (which has not been used since July) but denies pain in that area. Of note she has been seen three times recently (10/2, 10/4, 10/6) for L leg and foot pain with unclear etiology - thought to possibly be neuropathic and was put on trial of Gabapentin.  She had a LE venous and arterial study which were both essentially normal. Also had ABIs done which showed moderate disease on the R but not L. She takes Percocet for chronic pain.  HPI  Past Medical History:  Diagnosis Date  . Anemia    low iron  . Arthritis   . CAD in native artery 08/12/2016   Nonobstructive ASCAD by cath with 60% D1 and 50% PL off RCA and 25% mid LAD.  . Diabetes mellitus    type 2  . Diabetic retinopathy (HCC)   . Dialysis patient [facility])    M-W-F @ [facility]  . Ejection fraction   . ESRD on hemodialysis [facility])    Dialysis T/Th/Sa  . GERD (gastroesophageal reflux disease)   . Glaucoma   . H/O: Bell's palsy 2007  . Hyperlipidemia   . Hypertension    a. labile - hx of BP dropping at HD.  . Macular degeneration   . Shortness of breath dyspnea    with walking    Patient Active Problem List   Diagnosis Date Noted  . CAD in native artery 08/12/2016  . Abnormal nuclear stress test 07/10/2016  . Chest pain   . Anemia, unspecified 07/08/2016    . Pain in the chest 07/07/2016  . Dyspnea on exertion 04/14/2016  . SOB (shortness of breath) 04/14/2016  . ESRD on dialysis (HCC) 04/14/2016  . Benign essential HTN 04/14/2016  . Hyperlipidemia 04/14/2016  . 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 Hypoglycemia 10/16/2015  . Elevated troponin 10/16/2015  . Ejection fraction   . Type 2 diabetes mellitus with renal complication (HCC) 09/26/2011  . Other complications due to renal dialysis device, implant, and graft 09/26/2011  . End stage renal disease (HCC) 09/26/2011    Past Surgical History:  Procedure Laterality Date  . ABDOMINAL HYSTERECTOMY    . ARTERIOVENOUS GRAFT PLACEMENT     Left forearm x 2, one removed in March  . ARTERIOVENOUS GRAFT PLACEMENT     Left forearm  . AVGG REMOVAL  10/08/2011   Procedure: REMOVAL OF ARTERIOVENOUS GORETEX GRAFT (AVGG);  Surgeon: Sherren Kerns, MD;  [facility] OR;  Service: Vascular;  Laterality: Left;  . BTL    . CARDIAC CATHETERIZATION N/A 07/10/2016   Procedure: Left Heart Cath and Coronary Angiography;  Surgeon: Yvonne Kendall, MD;  Location: [facility] INVASIVE CV LAB;  Service: Cardiovascular;  Laterality: N/A;  . COLONOSCOPY    .  EXCHANGE OF A DIALYSIS CATHETER Right 03/27/2016   Procedure: EXCHANGE OF A DIALYSIS CATHETER;  Surgeon: Fransisco Hertz, MD;  Location: [facility] OR;  Service: Vascular;  Laterality: Right;  . EYE SURGERY Left    Lasik surgery on left. Cataract surgery on both eyes  . LAPAROTOMY  ?2000   Pelvic mass (Benign)  . REVISION OF ARTERIOVENOUS GORETEX GRAFT Left 03/27/2016   Procedure: REVISION OF LEFT ARTERIOVENOUS GORETEX GRAFT;  Surgeon: Fransisco Hertz, MD;  Location: [facility] OR;  Service: Vascular;  Laterality: Left;  . TONSILLECTOMY      OB History    No data available       Home Medications    Prior to Admission medications   Medication Sig Start Date End Date Taking? Authorizing Provider  acetaminophen (TYLENOL) 325 MG tablet Take 650 mg by mouth every  6 (six) hours as needed for moderate pain.   Yes Historical Provider, MD  amLODipine (NORVASC) 10 MG tablet Take 10 mg by mouth at bedtime.    Yes Historical Provider, MD  aspirin EC 81 MG tablet Take 81 mg by mouth at bedtime.    Yes Historical Provider, MD  calcium acetate (PHOSLO) 667 MG capsule Take 1,334 mg by mouth 3 (three) times daily with meals. Takes 2 caps with meals and 1 cap with snacks   Yes Historical Provider, MD  cinacalcet (SENSIPAR) 60 MG tablet Take 60 mg by mouth 2 (two) times a week.    Yes Historical Provider, MD  cyclobenzaprine (FLEXERIL) 5 MG tablet Take 1 tablet (5 mg total) by mouth 2 (two) times daily as needed for muscle spasms. 08/26/16  Yes Shawn C Joy, PA-C  diazepam (VALIUM) 5 MG tablet Take 0.5 tablets (2.5 mg total) by mouth every 6 (six) hours as needed for muscle spasms. 08/28/16  Yes Raeford Razor, MD  fenofibrate (TRICOR) 145 MG tablet Take 145 mg by mouth every evening.    Yes Historical Provider, MD  fish oil-omega-3 fatty acids 1000 MG capsule Take 1 g by mouth at bedtime.    Yes Historical Provider, MD  gabapentin (NEURONTIN) 100 MG capsule Take 1 capsule (100 mg total) by mouth 3 (three) times daily. 08/30/16  Yes Audry Pili, PA-C  insulin aspart (NOVOLOG) 100 UNIT/ML injection Inject 0-9 Units into the skin 3 (three) times daily with meals. Patient taking differently: Inject 0-9 Units into the skin 3 (three) times daily with meals. Per sliding scale 10/18/15  Yes Jeralyn Bennett, MD  isosorbide mononitrate (IMDUR) 30 MG 24 hr tablet Take 0.5 tablets (15 mg total) by mouth daily. 08/12/16 11/10/16 Yes Quintella Reichert, MD  labetalol (NORMODYNE) 200 MG tablet Take 1 tablet (200 mg total) by mouth 2 (two) times daily. Patient taking differently: Take 300 mg by mouth 2 (two) times daily.  07/11/16  Yes Thomasene Lot, MD  losartan (COZAAR) 100 MG tablet Take 100 mg by mouth every evening.   Yes Historical Provider, MD  multivitamin (RENA-VIT) TABS tablet Take 1 tablet  by mouth every evening.    Yes Historical Provider, MD  Naphazoline HCl (CLEAR EYES OP) Place 1 drop into both eyes as needed (for dry eyes).   Yes Historical Provider, MD  oxyCODONE-acetaminophen (PERCOCET/ROXICET) 5-325 MG tablet Take 1-2 tablets by mouth every 6 (six) hours as needed for severe pain. 08/26/16  Yes Shawn C Joy, PA-C  RaNITidine HCl (ACID REDUCER PO) Take 1 tablet by mouth as needed (for acid reflux).   Yes Historical Provider, MD  Travoprost, BAK Free, (TRAVATAN) 0.004 % SOLN ophthalmic solution Place 1 drop into both eyes at bedtime.   Yes Historical Provider, MD    Family History Family History  Problem Relation Age of Onset  . Diabetes Mother   . Heart disease Mother   . COPD Father   . Hypertension Father   . Lung disease Father   . Diabetes Father   . Diabetes Sister   . Breast cancer Sister     Social History Social History  Substance Use Topics  . Smoking status: Former Smoker    Types: Cigarettes    Quit date: 08/04/1999  . Smokeless tobacco: Never Used  . Alcohol use No     Allergies   Lactose intolerance (gi)   Review of Systems Review of Systems  Respiratory: Negative for shortness of breath.   Cardiovascular: Negative for chest pain.  Gastrointestinal: Negative for abdominal pain.  Musculoskeletal: Positive for arthralgias. Negative for joint swelling.  Skin: Negative for wound.  Neurological: Negative for syncope.  All other systems reviewed and are negative.    Physical Exam Updated Vital Signs BP 181/90 (BP Location: Right Arm)   Pulse 73   Temp 97.5 F (36.4 C) (Oral)   Resp 17   Ht 5\' 6"  (1.676 m)   Wt 77.1 kg   SpO2 100%   BMI 27.44 kg/m   Physical Exam  Constitutional: She is oriented to person, place, and time. She appears well-developed and well-nourished. She appears distressed.  In pain  HENT:  Head: Normocephalic and atraumatic.  Eyes: Conjunctivae are normal. Pupils are equal, round, and reactive to light. Right  eye exhibits no discharge. Left eye exhibits no discharge. No scleral icterus.  Neck: Normal range of motion. Neck supple.  Cardiovascular: Normal rate and regular rhythm.   Pulmonary/Chest: Effort normal and breath sounds normal. No respiratory distress.  Abdominal: Soft. She exhibits no distension. There is no tenderness.  Musculoskeletal: She exhibits no edema.  Left thigh and knee: No obvious swelling or deformity. Ttenderness to palpation over knee cap. Graft in left thigh is non-tender. Decreased ROM of knee due to pain. Left lower leg, ankle, foot: No obvious swelling or deformity. Diffuse tenderness to palpation over lower leg, ankle, and mostly over anterior and lateral aspect of foot Decreased ROM of ankle. N/V intact.    Neurological: She is alert and oriented to person, place, and time.  Skin: Skin is warm and dry.  No swelling or open wounds  Psychiatric: She has a normal mood and affect. Her behavior is normal.  Nursing note and vitals reviewed.    ED Treatments / Results  Labs (all labs ordered are listed, but only abnormal results are displayed) Labs Reviewed - No data to display  EKG  EKG Interpretation None       Radiology No results found.  Procedures Procedures (including critical care time)  Medications Ordered in ED Medications  HYDROmorphone (DILAUDID) injection 1 mg (1 mg Intramuscular Given 09/04/16 0746)     Initial Impression / Assessment and Plan / ED Course  I have reviewed the triage vital signs and the nursing notes.  Pertinent labs & imaging results that were available during my care of the patient were reviewed by me and considered in my medical decision making (see chart for details).  Clinical Course   70 year old female presents with fall and subsequent L leg pain. Xrays negative for acute bony pathology or dislocation. Pain treated in ED. ASO brace applied.  She has had a thorough work up in the past week for her left leg pain which  was normal. Shared visit with Dr. Penne LashIssacs who is in agreement with plan. Will d/c with PCP follow up. She can continue her home pain regimen.  Final Clinical Impressions(s) / ED Diagnoses   Final diagnoses:  Fall  Left leg pain    New Prescriptions Discharge Medication List as of 09/04/2016  8:49 AM       Bethel BornKelly Marie Feiga Nadel, PA-C 09/04/16 1023    Shaune Pollackameron Isaacs, MD 09/04/16 1756

## 2016-09-04 NOTE — Discharge Instructions (Signed)
You can take Tylenol or Percocet for pain Please follow up with your family doctor if your symptoms are not getting better. Return to the ED if symptoms get worse

## 2016-09-04 NOTE — ED Notes (Signed)
Pt taken to xray 

## 2016-09-04 NOTE — ED Triage Notes (Signed)
Pt arrives to [facility] at this time via [facility] stretcher. Per EMS pt reported increased pain in her left ankle upon standing this morning. EMS reports that pt was in bed when they arrived on scene.  Pt is noted to be anxious and restless in bed at this time.   Chief Complaint  Patient presents with  . Fall  . Foot Pain   Past Medical History:  Diagnosis Date  . Anemia    low iron  . Arthritis   . CAD in native artery 08/12/2016   Nonobstructive ASCAD by cath with 60% D1 and 50% PL off RCA and 25% mid LAD.  . Diabetes mellitus    type 2  . Diabetic retinopathy (HCC)   . Dialysis patient [facility](HCC)    M-W-F @ [facility]  . Ejection fraction   . ESRD on hemodialysis [facility](HCC)    Dialysis T/Th/Sa  . GERD (gastroesophageal reflux disease)   . Glaucoma   . H/O: Bell's palsy 2007  . Hyperlipidemia   . Hypertension    a. labile - hx of BP dropping at HD.  . Macular degeneration   . Shortness of breath dyspnea    with walking

## 2016-09-04 NOTE — Progress Notes (Signed)
Orthopedic Tech Progress Note Patient Details:  Rennie PlowmanDianne Mcelhaney 12/18/1945 [id number]  Ortho Devices Type of Ortho Device: ASO Ortho Device/Splint Location: lle Ortho Device/Splint Interventions: Application   Sailor Haughn 09/04/2016, 8:56 AM

## 2016-09-20 ENCOUNTER — Encounter: Payer: Self-pay | Admitting: Physician Assistant

## 2016-09-20 NOTE — Progress Notes (Signed)
Cardiology Office Note    Date:  09/23/2016  ID:  Colleen Mullins, DOB Oct 12, 1946, MRN [id number] PCP:  Zoila Shutter, MD  Cardiologist:  Dr. Mayford Knife   Chief Complaint: f/u chest pain  History of Present Illness:  Colleen Mullins is a 70 y.o. female with history of CAD (Nonobstructive by cath 06/2016 with 60% D1 and 50% PL off RCA and 25% mid LAD), diastolic dysfunction, DM, retinopathy, macular degeneration, ESRD on HD, GERD, labile HTN, anemia, hyperlipidemia, PAD who presents for follow-up.  She was admitted 06/2016 to [facility] with complaints of chest pain and low level elevation of Trop. Echo showed normal LVF with EF 65-70% with G2DD. Nuclear stress test revealed reversible ischemia in the anterior and anterolateral walls with global HK. She underwent cath showing mild to moderate non obstructive CAD and CP was felt to be related to GERD. At f/u OV 08/12/16 her blood pressure was somewhat elevated and she was also reporting continued atypical chest pain, question due to poorly controlled HTN, GERD, microvascular angina or spasm. Imdur 15mg  daily was added. Dr. Mayford Knife requested copy of FLP from PCP but this is unavailable. Last labs otherwise showed Hgb 10.8, K 4.3 (08/26/16), LFTs OK 06/2016.  She presents back for follow-up doing well from a cardiac standpoint. No further chest pain. No dyspnea or syncope. She does report occasional drops in her BP with dialysis but it sounds like she's found a happy medium with her current BP regimen. She ambulates with a walker as well as power wheelchair due to history of falls. Had issues with left leg pain recently, now resolved - LE venous duplex neg, LE art duplex showed normal L ABI but moderate R arterial disease. No RLE claudication or ulcers. She was told she may have neuropathy or RLS.   Past Medical History:  Diagnosis Date  . Anemia    low iron  . Arthritis   . CAD in native artery 08/12/2016   a. Nonobstructive ASCAD by cath with 60% D1 and 50% PL  off RCA and 25% mid LAD.  . Diabetes mellitus    type 2  . Diabetic retinopathy (HCC)   . Dialysis patient [facility])    M-W-F @ [facility]  . Diastolic dysfunction   . Ejection fraction   . ESRD on hemodialysis [facility])    Dialysis T/Th/Sa  . GERD (gastroesophageal reflux disease)   . Glaucoma   . H/O: Bell's palsy 2007  . Hyperlipidemia   . Hypertension    a. labile - hx of BP dropping at HD.  . Macular degeneration     Past Surgical History:  Procedure Laterality Date  . ABDOMINAL HYSTERECTOMY    . ARTERIOVENOUS GRAFT PLACEMENT     Left forearm x 2, one removed in March  . ARTERIOVENOUS GRAFT PLACEMENT     Left forearm  . AVGG REMOVAL  10/08/2011   Procedure: REMOVAL OF ARTERIOVENOUS GORETEX GRAFT (AVGG);  Surgeon: Sherren Kerns, MD;  Location: [facility] OR;  Service: Vascular;  Laterality: Left;  . BTL    . CARDIAC CATHETERIZATION N/A 07/10/2016   Procedure: Left Heart Cath and Coronary Angiography;  Surgeon: Yvonne Kendall, MD;  Location: [facility] INVASIVE CV LAB;  Service: Cardiovascular;  Laterality: N/A;  . COLONOSCOPY    . EXCHANGE OF A DIALYSIS CATHETER Right 03/27/2016   Procedure: EXCHANGE OF A DIALYSIS CATHETER;  Surgeon: Fransisco Hertz, MD;  Location: [facility] OR;  Service: Vascular;  Laterality: Right;  . EYE SURGERY Left  Lasik surgery on left. Cataract surgery on both eyes  . LAPAROTOMY  ?2000   Pelvic mass (Benign)  . REVISION OF ARTERIOVENOUS GORETEX GRAFT Left 03/27/2016   Procedure: REVISION OF LEFT ARTERIOVENOUS GORETEX GRAFT;  Surgeon: Fransisco HertzBrian L Chen, MD;  [facility];  Service: Vascular;  Laterality: Left;  . TONSILLECTOMY      Current Medications: Current Outpatient Prescriptions  Medication Sig Dispense Refill  . acetaminophen (TYLENOL) 325 MG tablet Take 650 mg by mouth every 6 (six) hours as needed for moderate pain.    Marland Kitchen. amLODipine (NORVASC) 10 MG tablet Take 10 mg by mouth at bedtime.     Marland Kitchen. aspirin EC 81 MG tablet Take 81 mg by mouth at bedtime.     . calcium  acetate (PHOSLO) 667 MG capsule Take 1,334 mg by mouth 3 (three) times daily with meals. Takes 2 caps with meals and 1 cap with snacks    . cinacalcet (SENSIPAR) 60 MG tablet Take 60 mg by mouth 2 (two) times a week.     . cyclobenzaprine (FLEXERIL) 5 MG tablet Take 1 tablet (5 mg total) by mouth 2 (two) times daily as needed for muscle spasms. 20 tablet 0  . diazepam (VALIUM) 5 MG tablet Take 0.5 tablets (2.5 mg total) by mouth every 6 (six) hours as needed for muscle spasms. 12 tablet 0  . fenofibrate (TRICOR) 145 MG tablet Take 145 mg by mouth every evening.     . fish oil-omega-3 fatty acids 1000 MG capsule Take 1 g by mouth at bedtime.     . gabapentin (NEURONTIN) 100 MG capsule Take 1 capsule (100 mg total) by mouth 3 (three) times daily. 60 capsule 0  . HYDROcodone-acetaminophen (NORCO/VICODIN) 5-325 MG tablet Take 1 tablet by mouth every 6 (six) hours as needed for pain.    Marland Kitchen. insulin aspart (NOVOLOG) 100 UNIT/ML injection Inject 0-9 Units into the skin 3 (three) times daily with meals. 10 mL 11  . isosorbide mononitrate (IMDUR) 30 MG 24 hr tablet Take 0.5 tablets (15 mg total) by mouth daily. 45 tablet 3  . labetalol (NORMODYNE) 200 MG tablet Take 1 tablet (200 mg total) by mouth 2 (two) times daily. 60 tablet 3  . losartan (COZAAR) 100 MG tablet Take 100 mg by mouth every evening.    . multivitamin (RENA-VIT) TABS tablet Take 1 tablet by mouth every evening.     . Naphazoline HCl (CLEAR EYES OP) Place 1 drop into both eyes as needed (for dry eyes).    Marland Kitchen. oxyCODONE-acetaminophen (PERCOCET/ROXICET) 5-325 MG tablet Take 1-2 tablets by mouth every 6 (six) hours as needed for severe pain. 15 tablet 0  . RaNITidine HCl (ACID REDUCER PO) Take 1 tablet by mouth as needed (for acid reflux).    . Travoprost, BAK Free, (TRAVATAN) 0.004 % SOLN ophthalmic solution Place 1 drop into both eyes at bedtime.     No current facility-administered medications for this visit.      Allergies:   Iodinated  diagnostic agents; Other; and Lactose intolerance (gi)   Social History   Social History  . Marital status: Widowed    Spouse name: N/A  . Number of children: N/A  . Years of education: N/A   Social History Main Topics  . Smoking status: Former Smoker    Types: Cigarettes    Quit date: 08/04/1999  . Smokeless tobacco: Never Used  . Alcohol use No  . Drug use: No  . Sexual activity: Not Asked  Other Topics Concern  . None   Social History Narrative  . None     Family History:  The patient's family history includes Breast cancer in her sister; COPD in her father; Diabetes in her father, mother, and sister; Heart disease in her mother; Hypertension in her father; Lung disease in her father.   ROS:   Please see the history of present illness. Uses walker, occasional falls. All other systems are reviewed and otherwise negative.    PHYSICAL EXAM:   VS:  BP (!) 142/74   Pulse 66   Ht 5\' 6"  (1.676 m)   Wt 168 lb (76.2 kg)   BMI 27.12 kg/m   BMI: Body mass index is 27.12 kg/m. GEN: Well nourished, well developed AAF in no acute distress  HEENT: normocephalic, atraumatic Neck: no JVD, carotid bruits, or masses Cardiac:RRR; no murmurs, rubs, or gallops, no edema  Respiratory:  clear to auscultation bilaterally, normal work of breathing GI: soft, nontender, nondistended, + BS MS: no deformity or atrophy  Skin: warm and dry, no rash. HD access site right upper chest Neuro:  Alert and Oriented x 3, Strength and sensation are intact, follows commands Psych: euthymic mood, full affect  Wt Readings from Last 3 Encounters:  09/23/16 168 lb (76.2 kg)  09/04/16 170 lb (77.1 kg)  08/28/16 170 lb (77.1 kg)      Studies/Labs Reviewed:   EKG:  EKG was ordered today and personally reviewed by me and demonstrates NSR 66bpm, prior inferior and anterolateral infarct, TWI avL, poor R wave progression.  Recent Labs: 04/14/2016: Magnesium 2.8 07/07/2016: ALT 20 08/26/2016: BUN 44;  Creatinine, Ser 6.98; Hemoglobin 10.8; Platelets 601; Potassium 4.3; Sodium 139   Lipid Panel No results found for: CHOL, TRIG, HDL, CHOLHDL, VLDL, LDLCALC, LDLDIRECT  Additional studies/ records that were reviewed today include: Summarized above.    ASSESSMENT & PLAN:   1. Precordial chest pain - resolved on Imdur. Continue. 2. CAD - continue management of risk factors. Will have nurse call PCP's office to see if she's had lipids in the last 6 months. If none available, will arrange f/u lipids/LFTs at next HD session. Continue ASA. 3. Essential HTN - improved on current regimen. Have to be careful not to lower BP too much given h/o BP dropping below 100 at HD.  4. Hyperlipidemia - see above re: labs. Ideally would be on statin given CAD/PAD. 5. PAD - recent LE art duplex with mod arterial disease. No recent RLE claudication. Continue ASA. Continue ambulation as she is able - she uses a walker as well as power chair to get around given h/o falls.   Disposition: F/u with Dr. Mayford Knife as previously recommended (6 months from 07/2016).   Medication Adjustments/Labs and Tests Ordered: Current medicines are reviewed at length with the patient today.  Concerns regarding medicines are outlined above. Medication changes, Labs and Tests ordered today are summarized above and listed in the Patient Instructions accessible in Encounters.   Thomasene Mohair PA-C  09/23/2016 10:04 AM    [facility] [address] [place], [place], [place]  [address] Phone: [phone]; Fax: [phone]

## 2016-09-23 ENCOUNTER — Ambulatory Visit (INDEPENDENT_AMBULATORY_CARE_PROVIDER_SITE_OTHER): Payer: Medicare HMO | Admitting: Physician Assistant

## 2016-09-23 ENCOUNTER — Encounter: Payer: Self-pay | Admitting: Physician Assistant

## 2016-09-23 VITALS — BP 142/74 | HR 66 | Ht 66.0 in | Wt 168.0 lb

## 2016-09-23 DIAGNOSIS — E785 Hyperlipidemia, unspecified: Secondary | ICD-10-CM | POA: Diagnosis not present

## 2016-09-23 DIAGNOSIS — R072 Precordial pain: Secondary | ICD-10-CM | POA: Diagnosis not present

## 2016-09-23 DIAGNOSIS — I10 Essential (primary) hypertension: Secondary | ICD-10-CM | POA: Diagnosis not present

## 2016-09-23 DIAGNOSIS — I2510 Atherosclerotic heart disease of native coronary artery without angina pectoris: Secondary | ICD-10-CM | POA: Diagnosis not present

## 2016-09-23 DIAGNOSIS — I739 Peripheral vascular disease, unspecified: Secondary | ICD-10-CM

## 2016-09-23 NOTE — Patient Instructions (Addendum)
Medication Instructions:  Your physician recommends that you continue on your current medications as directed. Please refer to the Current Medication list given to you today.   Labwork: None ordered  Testing/Procedures: None ordered  Follow-Up: Your physician recommends that you schedule a follow-up appointment in: See Dr. Mayford Knifeurner as planned   Any Other Special Instructions Will Be Listed Below (If Applicable).   If you need a refill on your cardiac medications before your next appointment, please call your pharmacy.

## 2016-09-25 ENCOUNTER — Emergency Department (HOSPITAL_COMMUNITY): Payer: Medicare HMO

## 2016-09-25 ENCOUNTER — Emergency Department (HOSPITAL_COMMUNITY)
Admission: EM | Admit: 2016-09-25 | Discharge: 2016-09-25 | Disposition: A | Discharge status: Home / Self Care | Payer: Medicare HMO | Attending: Emergency Medicine | Admitting: Emergency Medicine

## 2016-09-25 ENCOUNTER — Encounter (HOSPITAL_COMMUNITY): Payer: Self-pay | Admitting: Emergency Medicine

## 2016-09-25 DIAGNOSIS — I2510 Atherosclerotic heart disease of native coronary artery without angina pectoris: Secondary | ICD-10-CM | POA: Diagnosis not present

## 2016-09-25 DIAGNOSIS — Y939 Activity, unspecified: Secondary | ICD-10-CM | POA: Diagnosis not present

## 2016-09-25 DIAGNOSIS — N186 End stage renal disease: Secondary | ICD-10-CM | POA: Insufficient documentation

## 2016-09-25 DIAGNOSIS — Y929 Unspecified place or not applicable: Secondary | ICD-10-CM | POA: Insufficient documentation

## 2016-09-25 DIAGNOSIS — Z794 Long term (current) use of insulin: Secondary | ICD-10-CM | POA: Insufficient documentation

## 2016-09-25 DIAGNOSIS — Y999 Unspecified external cause status: Secondary | ICD-10-CM | POA: Diagnosis not present

## 2016-09-25 DIAGNOSIS — Z79899 Other long term (current) drug therapy: Secondary | ICD-10-CM | POA: Insufficient documentation

## 2016-09-25 DIAGNOSIS — E1122 Type 2 diabetes mellitus with diabetic chronic kidney disease: Secondary | ICD-10-CM | POA: Insufficient documentation

## 2016-09-25 DIAGNOSIS — Z791 Long term (current) use of non-steroidal anti-inflammatories (NSAID): Secondary | ICD-10-CM | POA: Diagnosis not present

## 2016-09-25 DIAGNOSIS — Z992 Dependence on renal dialysis: Secondary | ICD-10-CM | POA: Diagnosis not present

## 2016-09-25 DIAGNOSIS — I120 Hypertensive chronic kidney disease with stage 5 chronic kidney disease or end stage renal disease: Secondary | ICD-10-CM | POA: Diagnosis not present

## 2016-09-25 DIAGNOSIS — Z87891 Personal history of nicotine dependence: Secondary | ICD-10-CM | POA: Insufficient documentation

## 2016-09-25 DIAGNOSIS — W19XXXA Unspecified fall, initial encounter: Secondary | ICD-10-CM | POA: Insufficient documentation

## 2016-09-25 DIAGNOSIS — Z7982 Long term (current) use of aspirin: Secondary | ICD-10-CM | POA: Insufficient documentation

## 2016-09-25 DIAGNOSIS — M25572 Pain in left ankle and joints of left foot: Secondary | ICD-10-CM | POA: Insufficient documentation

## 2016-09-25 NOTE — ED Provider Notes (Signed)
[facility]-EMERGENCY DEPT Provider Note   CSN: [id number] Arrival date & time: 09/25/16  1432     History   Chief Complaint Chief Complaint  Patient presents with  . Ankle Injury    HPI Colleen Mullins is a 70 y.o. female.  70 year old female with extensive past medical history below who presents with left ankle pain. The patient had a mechanical fall 3 weeks ago and came to the ED for evaluation. X-rays at that time were negative. She was given ASO brace and has been bearing weight on her foot but she feels like her pain is getting worse instead of better. The pain is concentrated on the outside part of her ankle. She endorses normal sensation of her foot. She does not know how to get the ankle brace back on after she took it off to bathe.   The history is provided by the patient.  Ankle Injury     Past Medical History:  Diagnosis Date  . Anemia    low iron  . Arthritis   . CAD in native artery 08/12/2016   a. Nonobstructive ASCAD by cath with 60% D1 and 50% PL off RCA and 25% mid LAD.  . Diabetes mellitus    type 2  . Diabetic retinopathy (HCC)   . Dialysis patient [facility])    M-W-F @ [facility]  . Diastolic dysfunction   . Ejection fraction   . ESRD on hemodialysis [facility])    Dialysis T/Th/Sa  . GERD (gastroesophageal reflux disease)   . Glaucoma   . H/O: Bell's palsy 2007  . Hyperlipidemia   . Hypertension    a. labile - hx of BP dropping at HD.  . Macular degeneration   . PAD (peripheral artery disease) (HCC)    a. ABI 08/2016: mod RLE disease, normal L ABI.    Patient Active Problem List   Diagnosis Date Noted  . CAD in native artery 08/12/2016  . Abnormal nuclear stress test 07/10/2016  . Chest pain   . Anemia, unspecified 07/08/2016  . Pain in the chest 07/07/2016  . Dyspnea on exertion 04/14/2016  . SOB (shortness of breath) 04/14/2016  . ESRD on dialysis (HCC) 04/14/2016  . Benign essential HTN 04/14/2016  . Hyperlipidemia 04/14/2016  . 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 Hypoglycemia 10/16/2015  . Elevated troponin 10/16/2015  . Ejection fraction   . Type 2 diabetes mellitus with renal complication (HCC) 09/26/2011  . Other complications due to renal dialysis device, implant, and graft 09/26/2011  . End stage renal disease (HCC) 09/26/2011    Past Surgical History:  Procedure Laterality Date  . ABDOMINAL HYSTERECTOMY    . ARTERIOVENOUS GRAFT PLACEMENT     Left forearm x 2, one removed in March  . ARTERIOVENOUS GRAFT PLACEMENT     Left forearm  . AVGG REMOVAL  10/08/2011   Procedure: REMOVAL OF ARTERIOVENOUS GORETEX GRAFT (AVGG);  Surgeon: Sherren Kerns, MD;  Location: [facility] OR;  Service: Vascular;  Laterality: Left;  . BTL    . CARDIAC CATHETERIZATION N/A 07/10/2016   Procedure: Left Heart Cath and Coronary Angiography;  Surgeon: Yvonne Kendall, MD;  Location: [facility] INVASIVE CV LAB;  Service: Cardiovascular;  Laterality: N/A;  . COLONOSCOPY    . EXCHANGE OF A DIALYSIS CATHETER Right 03/27/2016   Procedure: EXCHANGE OF A DIALYSIS CATHETER;  Surgeon: Fransisco Hertz, MD;  Location: [facility] OR;  Service: Vascular;  Laterality: Right;  . EYE SURGERY Left    Lasik surgery on  left. Cataract surgery on both eyes  . LAPAROTOMY  ?2000   Pelvic mass (Benign)  . REVISION OF ARTERIOVENOUS GORETEX GRAFT Left 03/27/2016   Procedure: REVISION OF LEFT ARTERIOVENOUS GORETEX GRAFT;  Surgeon: Fransisco Hertz, MD;  Location: [facility] OR;  Service: Vascular;  Laterality: Left;  . TONSILLECTOMY      OB History    No data available       Home Medications    Prior to Admission medications   Medication Sig Start Date End Date Taking? Authorizing Provider  acetaminophen (TYLENOL) 325 MG tablet Take 650 mg by mouth every 6 (six) hours as needed for moderate pain.   Yes Historical Provider, MD  amLODipine (NORVASC) 10 MG tablet Take 10 mg by mouth at bedtime.    Yes Historical Provider, MD  aspirin EC 81 MG tablet Take 81 mg by mouth at bedtime.     Yes Historical Provider, MD  calcium acetate (PHOSLO) 667 MG capsule Take 667-1,334 mg by mouth 3 (three) times daily with meals. Takes 2 caps with meals and 1 cap with snacks   Yes Historical Provider, MD  cinacalcet (SENSIPAR) 60 MG tablet Take 60 mg by mouth 2 (two) times a week.    Yes Historical Provider, MD  cyclobenzaprine (FLEXERIL) 5 MG tablet Take 1 tablet (5 mg total) by mouth 2 (two) times daily as needed for muscle spasms. 08/26/16  Yes Shawn C Joy, PA-C  fenofibrate (TRICOR) 145 MG tablet Take 145 mg by mouth daily.    Yes Historical Provider, MD  fish oil-omega-3 fatty acids 1000 MG capsule Take 1 g by mouth at bedtime.    Yes Historical Provider, MD  gabapentin (NEURONTIN) 100 MG capsule Take 1 capsule (100 mg total) by mouth 3 (three) times daily. 08/30/16  Yes Audry Pili, PA-C  HYDROcodone-acetaminophen (NORCO/VICODIN) 5-325 MG tablet Take 1 tablet by mouth every 6 (six) hours as needed for pain. 09/19/16  Yes Historical Provider, MD  insulin aspart (NOVOLOG) 100 UNIT/ML injection Inject 0-9 Units into the skin 3 (three) times daily with meals. Patient taking differently: Inject 0-9 Units into the skin 3 (three) times daily as needed for high blood sugar (with meals). Per sliding scale 10/18/15  Yes Jeralyn Bennett, MD  isosorbide mononitrate (IMDUR) 30 MG 24 hr tablet Take 0.5 tablets (15 mg total) by mouth daily. 08/12/16 11/10/16 Yes Quintella Reichert, MD  labetalol (NORMODYNE) 200 MG tablet Take 1 tablet (200 mg total) by mouth 2 (two) times daily. 07/11/16  Yes Thomasene Lot, MD  losartan (COZAAR) 100 MG tablet Take 100 mg by mouth every evening.   Yes Historical Provider, MD  multivitamin (RENA-VIT) TABS tablet Take 1 tablet by mouth every evening.    Yes Historical Provider, MD  Naphazoline HCl (CLEAR EYES OP) Place 1 drop into both eyes as needed (for dry eyes).   Yes Historical Provider, MD  oxyCODONE-acetaminophen (PERCOCET/ROXICET) 5-325 MG tablet Take 1-2 tablets by mouth every 6  (six) hours as needed for severe pain. 08/26/16  Yes Shawn C Joy, PA-C  RaNITidine HCl (ACID REDUCER PO) Take 1 tablet by mouth as needed (for acid reflux).   Yes Historical Provider, MD  Travoprost, BAK Free, (TRAVATAN) 0.004 % SOLN ophthalmic solution Place 1 drop into both eyes at bedtime.   Yes Historical Provider, MD  diazepam (VALIUM) 5 MG tablet Take 0.5 tablets (2.5 mg total) by mouth every 6 (six) hours as needed for muscle spasms. Patient not taking: Reported on 09/25/2016 08/28/16  Raeford RazorStephen Kohut, MD    Family History Family History  Problem Relation Age of Onset  . Diabetes Mother   . Heart disease Mother   . COPD Father   . Hypertension Father   . Lung disease Father   . Diabetes Father   . Diabetes Sister   . Breast cancer Sister     Social History Social History  Substance Use Topics  . Smoking status: Former Smoker    Types: Cigarettes    Quit date: 08/04/1999  . Smokeless tobacco: Never Used  . Alcohol use No     Allergies   Iodinated diagnostic agents and Lactose intolerance (gi)   Review of Systems Review of Systems 10 Systems reviewed and are negative for acute change except as noted in the HPI.   Physical Exam Updated Vital Signs BP 167/83 (BP Location: Right Arm)   Pulse 68   Temp 98.4 F (36.9 C) (Oral)   Resp 18   Ht 5\' 6"  (1.676 m)   Wt 168 lb (76.2 kg)   SpO2 94%   BMI 27.12 kg/m   Physical Exam  Constitutional: She is oriented to person, place, and time. She appears well-developed and well-nourished. No distress.  HENT:  Head: Normocephalic and atraumatic.  Eyes: Conjunctivae are normal.  Pulmonary/Chest:  Central line R upper chest  Musculoskeletal:  Achilles tendon intact,  no base of 5th metatarsal tenderness, no midfoot instability +lateral malleolus tenderness without obvious deformity, no medial mal tenderness  Neurological: She is alert and oriented to person, place, and time.  Normal sensation b/l lower extremities  Skin:  Skin is warm and dry. Capillary refill takes less than 2 seconds. No rash noted. No erythema.  Psychiatric: She has a normal mood and affect. Judgment normal.  Nursing note and vitals reviewed.    ED Treatments / Results  Labs (all labs ordered are listed, but only abnormal results are displayed) Labs Reviewed - No data to display  EKG  EKG Interpretation None       Radiology Dg Ankle Complete Left  Result Date: 09/25/2016 CLINICAL DATA:  Left ankle pain for 3 weeks EXAM: LEFT ANKLE COMPLETE - 3+ VIEW COMPARISON:  09/12/2016 FINDINGS: Posterior and plantar calcaneal heel spurs identified. There is no evidence of fracture, dislocation, or joint effusion. There is no evidence of arthropathy or other focal bone abnormality. Soft tissues are unremarkable. IMPRESSION: 1. No acute findings. 2. Heel spurs. Electronically Signed   By: Signa Kellaylor  Stroud M.D.   On: 09/25/2016 15:10    Procedures Procedures (including critical care time)  Medications Ordered in ED Medications - No data to display   Initial Impression / Assessment and Plan / ED Course  I have reviewed the triage vital signs and the nursing notes.  Pertinent imaging results that were available during my care of the patient were reviewed by me and considered in my medical decision making (see chart for details).  Clinical Course   Patient with 3 weeks of left ankle pain after a fall. She was well-appearing on exam. No obvious deformity, lateral malleolus tenderness. Neurovascularly intact. Obtained a repeat plain films which are negative for fracture. I suspect ankle sprain. She has not been using ASO brace, educated on how to use.  Provided w/ crutches for comfort but encouraged to weight-bear as tolerated. Provided f/u info for orthopedics if symptoms not improved in 1 week. Pt voiced understanding.  Final Clinical Impressions(s) / ED Diagnoses   Final diagnoses:  Acute left ankle pain  New Prescriptions New  Prescriptions   No medications on file     Laurence Spatesachel Morgan Melyssa Signor, MD 09/25/16 1530

## 2016-09-25 NOTE — ED Triage Notes (Signed)
Patient reports falling 3 weeks ago. Complaining of left ankle pain. Patient was told her ankle was sprained and was given an ankle brace. Patient was told she needs crutches because the pain has increased over time. Patient has been bearing weight on the injured extremity and is worried her injury has gotten worse.

## 2016-10-15 ENCOUNTER — Emergency Department (HOSPITAL_COMMUNITY)
Admission: EM | Admit: 2016-10-15 | Discharge: 2016-10-15 | Disposition: A | Discharge status: Home / Self Care | Payer: Medicare HMO | Attending: Emergency Medicine | Admitting: Emergency Medicine

## 2016-10-15 ENCOUNTER — Encounter (HOSPITAL_COMMUNITY): Payer: Self-pay | Admitting: Emergency Medicine

## 2016-10-15 ENCOUNTER — Emergency Department (HOSPITAL_COMMUNITY): Payer: Medicare HMO

## 2016-10-15 DIAGNOSIS — Z955 Presence of coronary angioplasty implant and graft: Secondary | ICD-10-CM | POA: Diagnosis not present

## 2016-10-15 DIAGNOSIS — E11319 Type 2 diabetes mellitus with unspecified diabetic retinopathy without macular edema: Secondary | ICD-10-CM | POA: Insufficient documentation

## 2016-10-15 DIAGNOSIS — Z794 Long term (current) use of insulin: Secondary | ICD-10-CM | POA: Diagnosis not present

## 2016-10-15 DIAGNOSIS — N186 End stage renal disease: Secondary | ICD-10-CM | POA: Insufficient documentation

## 2016-10-15 DIAGNOSIS — R0789 Other chest pain: Secondary | ICD-10-CM | POA: Diagnosis not present

## 2016-10-15 DIAGNOSIS — R079 Chest pain, unspecified: Secondary | ICD-10-CM

## 2016-10-15 DIAGNOSIS — Z7982 Long term (current) use of aspirin: Secondary | ICD-10-CM | POA: Diagnosis not present

## 2016-10-15 DIAGNOSIS — I120 Hypertensive chronic kidney disease with stage 5 chronic kidney disease or end stage renal disease: Secondary | ICD-10-CM | POA: Insufficient documentation

## 2016-10-15 DIAGNOSIS — E1122 Type 2 diabetes mellitus with diabetic chronic kidney disease: Secondary | ICD-10-CM | POA: Insufficient documentation

## 2016-10-15 DIAGNOSIS — Z87891 Personal history of nicotine dependence: Secondary | ICD-10-CM | POA: Diagnosis not present

## 2016-10-15 DIAGNOSIS — I2510 Atherosclerotic heart disease of native coronary artery without angina pectoris: Secondary | ICD-10-CM | POA: Diagnosis not present

## 2016-10-15 DIAGNOSIS — Z992 Dependence on renal dialysis: Secondary | ICD-10-CM | POA: Diagnosis not present

## 2016-10-15 LAB — BASIC METABOLIC PANEL
Anion gap: 14 (ref 5–15)
BUN: 37 mg/dL — AB (ref 6–20)
CO2: 23 mmol/L (ref 22–32)
CREATININE: 4.35 mg/dL — AB (ref 0.44–1.00)
Calcium: 9.6 mg/dL (ref 8.9–10.3)
Chloride: 97 mmol/L — ABNORMAL LOW (ref 101–111)
GFR, EST AFRICAN AMERICAN: 11 mL/min — AB (ref >60)
GFR, EST NON AFRICAN AMERICAN: 9 mL/min — AB (ref >60)
Glucose, Bld: 199 mg/dL — ABNORMAL HIGH (ref 65–99)
Potassium: 4 mmol/L (ref 3.5–5.1)
SODIUM: 134 mmol/L — AB (ref 135–145)

## 2016-10-15 LAB — I-STAT TROPONIN, ED
TROPONIN I, POC: 0.11 ng/mL — AB (ref 0.00–0.08)
TROPONIN I, POC: 0.1 ng/mL — AB (ref 0.00–0.08)

## 2016-10-15 LAB — I-STAT CHEM 8, ED
BUN: 37 mg/dL — ABNORMAL HIGH (ref 6–20)
CALCIUM ION: 1.07 mmol/L — AB (ref 1.15–1.40)
Chloride: 98 mmol/L — ABNORMAL LOW (ref 101–111)
Creatinine, Ser: 4.3 mg/dL — ABNORMAL HIGH (ref 0.44–1.00)
GLUCOSE: 194 mg/dL — AB (ref 65–99)
HCT: 42 % (ref 36.0–46.0)
HEMOGLOBIN: 14.3 g/dL (ref 12.0–15.0)
POTASSIUM: 3.9 mmol/L (ref 3.5–5.1)
SODIUM: 133 mmol/L — AB (ref 135–145)
TCO2: 26 mmol/L (ref 0–100)

## 2016-10-15 LAB — HEPATIC FUNCTION PANEL
ALBUMIN: 4 g/dL (ref 3.5–5.0)
ALT: 18 U/L (ref 14–54)
AST: 29 U/L (ref 15–41)
Alkaline Phosphatase: 74 U/L (ref 38–126)
Bilirubin, Direct: <0.1 mg/dL — ABNORMAL LOW (ref 0.1–0.5)
Total Bilirubin: 0.3 mg/dL (ref 0.3–1.2)
Total Protein: 7.1 g/dL (ref 6.5–8.1)

## 2016-10-15 LAB — LIPASE, BLOOD: Lipase: 73 U/L — ABNORMAL HIGH (ref 11–51)

## 2016-10-15 MED ORDER — HYDROCODONE-ACETAMINOPHEN 5-325 MG PO TABS
1.0000 | ORAL_TABLET | Freq: Once | ORAL | Status: AC
  Administered 2016-10-15: 1 via ORAL
  Filled 2016-10-15: qty 1

## 2016-10-15 MED ORDER — NITROGLYCERIN 0.4 MG SL SUBL
0.4000 mg | SUBLINGUAL_TABLET | SUBLINGUAL | Status: DC | PRN
  Administered 2016-10-15: 0.4 mg via SUBLINGUAL
  Filled 2016-10-15: qty 1

## 2016-10-15 NOTE — ED Notes (Signed)
Pt called out c/o chest pain rating it at 10 out of 10. BP 146/88 HR 80, Pt given nitrox1. After nitro pt rating chest pain 3 out of 10. BP 113/98 HR 98 after nitro was given. Pt endorses SOB during episode.

## 2016-10-15 NOTE — ED Triage Notes (Signed)
PT was dialyzing and had approx 75% of her treatment finished when she developed central chest pain that radiated to left arm. PT was given 324 ASA, 4mg  Zofran, and 0.4mg  nitroglycerin SL.   PT reports chest pain resolved with SL nitro  Currently experiencing no chest pain  No cardiac history

## 2016-10-15 NOTE — Discharge Instructions (Signed)
Continue to take your blood pressure medications. Follow up with family doctor as needed.

## 2016-10-15 NOTE — Consult Note (Signed)
CARDIOLOGY CONSULT NOTE   Patient ID: Colleen Mullins MRN: [id number] DOB/AGE: 1946/08/05 70 y.o.  Admit date: 10/15/2016  Primary Physician   Zoila Shutter, MD Primary Cardiologist   Dr. Mayford Knife Reason for Consultation   Chest pain Requesting Physician  Dr. Adela Lank  HPI: Colleen Mullins is a 70 y.o. female with a history of CAD (Nonobstructive by cath 06/2016 with 60% D1 and 50% PL off RCA and 25% mid LAD), diastolic dysfunction, DM, retinopathy, macular degeneration, ESRD on HD, GERD, labile HTN, anemia, hyperlipidemia, PAD who presents by EMS from dialysis center for chest pain.    She was admitted 06/2016 to [facility] with complaints of chest pain and low level elevation of Trop. Echo showed normal LVF with EF 65-70% with G2DD. Nuclear stress test revealed reversible ischemia in the anterior and anterolateral walls with global HK. She underwent cath showing mild to moderate non obstructive CAD and CP was felt to be related to GERD.  Recently dealing with left leg pain. LE venous duplex neg, LE art duplex showed normal L ABI but moderate R arterial disease. No RLE claudication or ulcers. She was told she may have neuropathy or RLS. Recent multiple visit to ER with leg pain and mechanical fall. Started on gabapentin without improvement.  Limited ambulation due to leg pain. Recently told that she has L groin graft failure with occlusion --> has appointment with Surgeon Dr. Sondra Come @ [facility] 10/21/16. She missed dialysis yesterday due to this.   Today she was at dialysis center when he had episode of chest pain after approximately 75% treatment finished. Has L upper chest port a cath. She described the pain as sharp radiating to right arm with nausea. Given SL nitro x1,  ASA and zofran by EMS with minimal improvement. She was again given sl nitro x 1 ER with improvement of cp to 3/10 currently from 10/10 at dialysis center.   Troponin 0.11>0.1. Scr of 4.3. EKG showed sinus rhythm at rate of 87 bpm.   Past  Medical History:  Diagnosis Date  . Anemia    low iron  . Arthritis   . CAD in native artery 08/12/2016   a. Nonobstructive ASCAD by cath with 60% D1 and 50% PL off RCA and 25% mid LAD.  . Diabetes mellitus    type 2  . Diabetic retinopathy (HCC)   . Dialysis patient [facility])    M-W-F @ [facility]  . Diastolic dysfunction   . Ejection fraction   . ESRD on hemodialysis [facility])    Dialysis T/Th/Sa  . GERD (gastroesophageal reflux disease)   . Glaucoma   . H/O: Bell's palsy 2007  . Hyperlipidemia   . Hypertension    a. labile - hx of BP dropping at HD.  . Macular degeneration   . PAD (peripheral artery disease) (HCC)    a. ABI 08/2016: mod RLE disease, normal L ABI.     Past Surgical History:  Procedure Laterality Date  . ABDOMINAL HYSTERECTOMY    . ARTERIOVENOUS GRAFT PLACEMENT     Left forearm x 2, one removed in March  . ARTERIOVENOUS GRAFT PLACEMENT     Left forearm  . AVGG REMOVAL  10/08/2011   Procedure: REMOVAL OF ARTERIOVENOUS GORETEX GRAFT (AVGG);  Surgeon: Sherren Kerns, MD;  Location: [facility] OR;  Service: Vascular;  Laterality: Left;  . BTL    . CARDIAC CATHETERIZATION N/A 07/10/2016   Procedure: Left Heart Cath and Coronary Angiography;  Surgeon: Yvonne Kendall, MD;  Location: [facility] INVASIVE  CV LAB;  Service: Cardiovascular;  Laterality: N/A;  . COLONOSCOPY    . EXCHANGE OF A DIALYSIS CATHETER Right 03/27/2016   Procedure: EXCHANGE OF A DIALYSIS CATHETER;  Surgeon: Fransisco HertzBrian L Chen, MD;  [facility];  Service: Vascular;  Laterality: Right;  . EYE SURGERY Left    Lasik surgery on left. Cataract surgery on both eyes  . LAPAROTOMY  ?2000   Pelvic mass (Benign)  . REVISION OF ARTERIOVENOUS GORETEX GRAFT Left 03/27/2016   Procedure: REVISION OF LEFT ARTERIOVENOUS GORETEX GRAFT;  Surgeon: Fransisco HertzBrian L Chen, MD;  [facility];  Service: Vascular;  Laterality: Left;  . TONSILLECTOMY      Allergies  Allergen Reactions  . Iodinated Diagnostic Agents Hives and Itching  . Lactose  Intolerance (Gi) Other (See Comments)    Abdominal pains and bloating    I have reviewed the patient's current medications   nitroGLYCERIN  Prior to Admission medications   Medication Sig Start Date End Date Taking? Authorizing Provider  acetaminophen (TYLENOL) 325 MG tablet Take 650 mg by mouth every 6 (six) hours as needed for moderate pain.    Historical Provider, MD  amLODipine (NORVASC) 10 MG tablet Take 10 mg by mouth at bedtime.     Historical Provider, MD  aspirin EC 81 MG tablet Take 81 mg by mouth at bedtime.     Historical Provider, MD  calcium acetate (PHOSLO) 667 MG capsule Take 667-1,334 mg by mouth 3 (three) times daily with meals. Takes 2 caps with meals and 1 cap with snacks    Historical Provider, MD  cinacalcet (SENSIPAR) 60 MG tablet Take 60 mg by mouth 2 (two) times a week.     Historical Provider, MD  cyclobenzaprine (FLEXERIL) 5 MG tablet Take 1 tablet (5 mg total) by mouth 2 (two) times daily as needed for muscle spasms. 08/26/16   Shawn C Joy, PA-C  diazepam (VALIUM) 5 MG tablet Take 0.5 tablets (2.5 mg total) by mouth every 6 (six) hours as needed for muscle spasms. Patient not taking: Reported on 09/25/2016 08/28/16   Raeford RazorStephen Kohut, MD  fenofibrate (TRICOR) 145 MG tablet Take 145 mg by mouth daily.     Historical Provider, MD  fish oil-omega-3 fatty acids 1000 MG capsule Take 1 g by mouth at bedtime.     Historical Provider, MD  gabapentin (NEURONTIN) 100 MG capsule Take 1 capsule (100 mg total) by mouth 3 (three) times daily. 08/30/16   Audry Piliyler Mohr, PA-C  HYDROcodone-acetaminophen (NORCO/VICODIN) 5-325 MG tablet Take 1 tablet by mouth every 6 (six) hours as needed for pain. 09/19/16   Historical Provider, MD  insulin aspart (NOVOLOG) 100 UNIT/ML injection Inject 0-9 Units into the skin 3 (three) times daily with meals. Patient taking differently: Inject 0-9 Units into the skin 3 (three) times daily as needed for high blood sugar (with meals). Per sliding scale 10/18/15    Jeralyn BennettEzequiel Zamora, MD  isosorbide mononitrate (IMDUR) 30 MG 24 hr tablet Take 0.5 tablets (15 mg total) by mouth daily. 08/12/16 11/10/16  Quintella Reichertraci R Turner, MD  labetalol (NORMODYNE) 200 MG tablet Take 1 tablet (200 mg total) by mouth 2 (two) times daily. 07/11/16   Thomasene LotJames Taylor, MD  losartan (COZAAR) 100 MG tablet Take 100 mg by mouth every evening.    Historical Provider, MD  multivitamin (RENA-VIT) TABS tablet Take 1 tablet by mouth every evening.     Historical Provider, MD  Naphazoline HCl (CLEAR EYES OP) Place 1 drop into both eyes as  needed (for dry eyes).    Historical Provider, MD  oxyCODONE-acetaminophen (PERCOCET/ROXICET) 5-325 MG tablet Take 1-2 tablets by mouth every 6 (six) hours as needed for severe pain. 08/26/16   Shawn C Joy, PA-C  RaNITidine HCl (ACID REDUCER PO) Take 1 tablet by mouth as needed (for acid reflux).    Historical Provider, MD  Travoprost, BAK Free, (TRAVATAN) 0.004 % SOLN ophthalmic solution Place 1 drop into both eyes at bedtime.    Historical Provider, MD     Social History   Social History  . Marital status: Widowed    Spouse name: N/A  . Number of children: N/A  . Years of education: N/A   Occupational History  . Not on file.   Social History Main Topics  . Smoking status: Former Smoker    Types: Cigarettes    Quit date: 08/04/1999  . Smokeless tobacco: Never Used  . Alcohol use No  . Drug use: No  . Sexual activity: Not on file   Other Topics Concern  . Not on file   Social History Narrative  . No narrative on file    Family Status  Relation Status  . Mother Deceased at age 71   Heart , diabetes  . Father Deceased at age 10959   High Blood pressure  . Sister Alive  . Sister Alive   Family History  Problem Relation Age of Onset  . Diabetes Mother   . Heart disease Mother   . COPD Father   . Hypertension Father   . Lung disease Father   . Diabetes Father   . Diabetes Sister   . Breast cancer Sister     ROS:  Full 14 point review of  systems complete and found to be negative unless listed above.  Physical Exam: Blood pressure 113/98, pulse 84, temperature 98 F (36.7 C), temperature source Oral, resp. rate 16, height 5\' 6"  (1.676 m), weight 160 lb (72.6 kg), SpO2 96 %.  General: Well developed, well nourished, female in no acute distress Head: Eyes PERRLA, No xanthomas. Normocephalic and atraumatic, oropharynx without edema or exudate.  Lungs: Resp regular and unlabored, CTA. TTP at mid sternal area.  Heart: RRR no s3, s4, or murmurs..   Neck: No carotid bruits. No lymphadenopathy.  No JVD. Abdomen: Bowel sounds present, abdomen soft and non-tender without masses or hernias noted. Msk:  No spine or cva tenderness. No weakness, no joint deformities or effusions. Extremities: No clubbing, cyanosis or edema. DP/PT/Radials 2+ and equal bilaterally. Neuro: Alert and oriented X 3. No focal deficits noted. Psych:  Good affect, responds appropriately Skin: No rashes or lesions noted.  Labs:   Lab Results  Component Value Date   WBC 7.3 08/26/2016   HGB 14.3 10/15/2016   HCT 42.0 10/15/2016   MCV 97.4 08/26/2016   PLT 601 (H) 08/26/2016   No results for input(s): INR in the last 72 hours.  Recent Labs Lab 10/15/16 1000 10/15/16 1014  NA 134* 133*  K 4.0 3.9  CL 97* 98*  CO2 23  --   BUN 37* 37*  CREATININE 4.35* 4.30*  CALCIUM 9.6  --   PROT 7.1  --   BILITOT 0.3  --   ALKPHOS 74  --   ALT 18  --   AST 29  --   GLUCOSE 199* 194*  ALBUMIN 4.0  --    Magnesium  Date Value Ref Range Status  04/14/2016 2.8 (H) 1.7 - 2.4 mg/dL Final  No results for input(s): CKTOTAL, CKMB, TROPONINI in the last 72 hours.  Recent Labs  10/15/16 1012 10/15/16 1402  TROPIPOC 0.11* 0.10*   No results found for: PROBNP No results found for: CHOL, HDL, LDLCALC, TRIG No results found for: DDIMER Lipase  Date/Time Value Ref Range Status  10/15/2016 10:00 AM 73 (H) 11 - 51 U/L Final   TSH  Date/Time Value Ref Range  Status  08/16/2010 01:46 AM 1.397 0.350 - 4.500 uIU/mL Final   TIBC  Date/Time Value Ref Range Status  08/17/2010 03:30 AM 359 250 - 470 ug/dL Final   Iron  Date/Time Value Ref Range Status  08/17/2010 03:30 AM 53 42 - 135 ug/dL Final   Cath 05/2535 Procedures   Left Heart Cath and Coronary Angiography  Conclusion   1.  Mild to moderate, non-obstructive coronary artery disease.  No angiographic findings to explain patient's atypical chest pain or myocardial perfusion stress test findings. 2.  Normal left ventricular filling pressure.  Plan: 1.  Continue medical management and secondary prevention of CAD.    Echo: 06/2016 LV EF: 65% -   70%  ------------------------------------------------------------------- Indications:      Chest pain 786.51.  ------------------------------------------------------------------- History:   PMH:  Dyspnea. Hypertension.  ------------------------------------------------------------------- Study Conclusions  - Left ventricle: The cavity size was normal. There was severe   concentric hypertrophy. Systolic function was vigorous. The   estimated ejection fraction was in the range of 65% to 70%. Wall   motion was normal; there were no regional wall motion   abnormalities. Features are consistent with a pseudonormal left   ventricular filling pattern, with concomitant abnormal relaxation   and increased filling pressure (grade 2 diastolic dysfunction). - Aortic valve: Trileaflet; mildly thickened, mildly calcified   leaflets. - Mitral valve: Calcified annulus. Mildly thickened leaflets . - Left atrium: The atrium was moderately dilated.  Impressions:  - Compared to the prior study, there has been no significant   interval change.   Radiology:  Dg Chest 2 View  Result Date: 10/15/2016 CLINICAL DATA:  Chest pain following dialysis EXAM: CHEST  2 VIEW COMPARISON:  07/07/2016 FINDINGS: Cardiac shadow remains enlarged. A dialysis  catheter is again identified and stable. The lungs are well aerated bilaterally. Mild linear scarring is noted in the left lung base. No focal infiltrate or sizable effusion is seen. No acute bony abnormality is noted. IMPRESSION: No active cardiopulmonary disease. Electronically Signed   By: Alcide Clever M.D.   On: 10/15/2016 11:20    ASSESSMENT AND PLAN:     1. Chest pain  - Atypical. Minimally responsive to SL nitro. Her pain is reproducible with palpation. EKG without acute changes except PACs and PVCs. POC troponin of 0.11--->0.1 (troponin seems always high - at baseline). Recent abnormal stress test followed by moderate disease on cath 06/2016 as described above. Treat blood pressure and MSK pain. F/u with Dr. Mayford Knife as schedule.   2. ESRD on HD - L groin failed graft. F/u with surgeon 10/21/16 has been scheduled.   3. PAD - LE art duplex showed normal L ABI but moderate R arterial disease. No RLE claudication or ulcers. Continue ASA.   4. HLD - LDL 38. Continue statin.  5. HTN - Stable at presentation. However climbing up since here. Last reading 185/85. She states that when ever she is in pain her BP goes up.  6. Multiple joint pain leading to frequent falls - Per Primary/PCP.   SignedManson Passey, PA 10/15/2016, 3:42 PM Pager  [phone]  Co-Sign MD   I have seen, examined and evaluated the patient this Afternoon along with Mr. Tempie Hoist.  After reviewing all the available data and chart, we discussed the patients laboratory, study & physical findings as well as symptoms in detail. I agree with his findings, examination as well as impression recommendations as per our discussion.    Pt with known moderate/non-obstructive CAD from recent cath transferred from HD with c/o CP that is reporducible on exam.  Most likely MSK CP especially with recent cath.   She has not had any BP meds today - currently hypertensive - but not SOB. -- needs to go back on home meds.  I think  she is safe for d/c home.  F/u with her cardiologist as scheduled.   Bryan Lemma, M.D., M.S. Interventional Cardiologist   Pager # [phone] Phone # [phone] [address] [place], [place] [address]

## 2016-10-15 NOTE — ED Provider Notes (Signed)
[facility]-EMERGENCY DEPT Provider Note   CSN: [id number] Arrival date & time: 10/15/16  4098     History   Chief Complaint Chief Complaint  Patient presents with  . Chest Pain    HPI Colleen Mullins is a 70 y.o. female.  HPI Colleen Mullins is a 70 y.o. female with hx of CAD, anemia, ESRD on hemodialysis, DM, PAD, GERD, Presents to emergency department with complaint of chest pain. Patient was in dialysis this morning, when suddenly developed retrosternal chest pain that she described as pressure. She reported associated nausea, shortness of breath. Denied any dizziness or diaphoresis. She denies any history of chest pain during dialysis. She states for the last several days she has had mild epigastric discomfort in the mornings, with associated nausea and episodes of vomiting. She states that she stopped eating in the morning because she thought that her symptoms were because of food. Patient was approximately 75% done with dialysis. She received aspirin 324 mg by EMS and 4 mg of Zofran. She also received one sublingual nitroglycerin which relieved her chest pain. Asked pain-free. She denies any chest pain in the last several weeks. She was last seen by cardiologist on 09/23/16. She had cardiac cath on 8/17, which showed nonobstructive disease with 60% D1 and 50% PL off RCA and 25% mid LAD. Started on Imdur at that time. EF 65-70%. Currently being treated for PAD, states "have a balloon in left thigh, they cancelled my apt yesterday, I have another one on Monday." States at times give her pain.   Past Medical History:  Diagnosis Date  . Anemia    low iron  . Arthritis   . CAD in native artery 08/12/2016   a. Nonobstructive ASCAD by cath with 60% D1 and 50% PL off RCA and 25% mid LAD.  . Diabetes mellitus    type 2  . Diabetic retinopathy (HCC)   . Dialysis patient [facility])    M-W-F @ [facility]  . Diastolic dysfunction   . Ejection fraction   . ESRD on hemodialysis [facility])    Dialysis T/Th/Sa    . GERD (gastroesophageal reflux disease)   . Glaucoma   . H/O: Bell's palsy 2007  . Hyperlipidemia   . Hypertension    a. labile - hx of BP dropping at HD.  . Macular degeneration   . PAD (peripheral artery disease) (HCC)    a. ABI 08/2016: mod RLE disease, normal L ABI.    Patient Active Problem List   Diagnosis Date Noted  . CAD in native artery 08/12/2016  . Abnormal nuclear stress test 07/10/2016  . Chest pain   . Anemia, unspecified 07/08/2016  . Pain in the chest 07/07/2016  . Dyspnea on exertion 04/14/2016  . SOB (shortness of breath) 04/14/2016  . ESRD on dialysis (HCC) 04/14/2016  . Benign essential HTN 04/14/2016  . Hyperlipidemia 04/14/2016  . 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 Hypoglycemia 10/16/2015  . Elevated troponin 10/16/2015  . Ejection fraction   . Type 2 diabetes mellitus with renal complication (HCC) 09/26/2011  . Other complications due to renal dialysis device, implant, and graft 09/26/2011  . End stage renal disease (HCC) 09/26/2011    Past Surgical History:  Procedure Laterality Date  . ABDOMINAL HYSTERECTOMY    . ARTERIOVENOUS GRAFT PLACEMENT     Left forearm x 2, one removed in March  . ARTERIOVENOUS GRAFT PLACEMENT     Left forearm  . AVGG REMOVAL  10/08/2011   Procedure: REMOVAL OF  ARTERIOVENOUS GORETEX GRAFT (AVGG);  Surgeon: Sherren Kernsharles E Fields, MD;  [facility];  Service: Vascular;  Laterality: Left;  . BTL    . CARDIAC CATHETERIZATION N/A 07/10/2016   Procedure: Left Heart Cath and Coronary Angiography;  Surgeon: Yvonne Kendallhristopher End, MD;  [facility];  Service: Cardiovascular;  Laterality: N/A;  . COLONOSCOPY    . EXCHANGE OF A DIALYSIS CATHETER Right 03/27/2016   Procedure: EXCHANGE OF A DIALYSIS CATHETER;  Surgeon: Fransisco HertzBrian L Chen, MD;  [facility];  Service: Vascular;  Laterality: Right;  . EYE SURGERY Left    Lasik surgery on left. Cataract surgery on both eyes  . LAPAROTOMY  ?2000   Pelvic  mass (Benign)  . REVISION OF ARTERIOVENOUS GORETEX GRAFT Left 03/27/2016   Procedure: REVISION OF LEFT ARTERIOVENOUS GORETEX GRAFT;  Surgeon: Fransisco HertzBrian L Chen, MD;  [facility];  Service: Vascular;  Laterality: Left;  . TONSILLECTOMY      OB History    No data available       Home Medications    Prior to Admission medications   Medication Sig Start Date End Date Taking? Authorizing Provider  acetaminophen (TYLENOL) 325 MG tablet Take 650 mg by mouth every 6 (six) hours as needed for moderate pain.    Historical Provider, MD  amLODipine (NORVASC) 10 MG tablet Take 10 mg by mouth at bedtime.     Historical Provider, MD  aspirin EC 81 MG tablet Take 81 mg by mouth at bedtime.     Historical Provider, MD  calcium acetate (PHOSLO) 667 MG capsule Take 667-1,334 mg by mouth 3 (three) times daily with meals. Takes 2 caps with meals and 1 cap with snacks    Historical Provider, MD  cinacalcet (SENSIPAR) 60 MG tablet Take 60 mg by mouth 2 (two) times a week.     Historical Provider, MD  cyclobenzaprine (FLEXERIL) 5 MG tablet Take 1 tablet (5 mg total) by mouth 2 (two) times daily as needed for muscle spasms. 08/26/16   Shawn C Joy, PA-C  diazepam (VALIUM) 5 MG tablet Take 0.5 tablets (2.5 mg total) by mouth every 6 (six) hours as needed for muscle spasms. Patient not taking: Reported on 09/25/2016 08/28/16   Raeford RazorStephen Kohut, MD  fenofibrate (TRICOR) 145 MG tablet Take 145 mg by mouth daily.     Historical Provider, MD  fish oil-omega-3 fatty acids 1000 MG capsule Take 1 g by mouth at bedtime.     Historical Provider, MD  gabapentin (NEURONTIN) 100 MG capsule Take 1 capsule (100 mg total) by mouth 3 (three) times daily. 08/30/16   Audry Piliyler Mohr, PA-C  HYDROcodone-acetaminophen (NORCO/VICODIN) 5-325 MG tablet Take 1 tablet by mouth every 6 (six) hours as needed for pain. 09/19/16   Historical Provider, MD  insulin aspart (NOVOLOG) 100 UNIT/ML injection Inject 0-9 Units into the skin 3 (three) times daily with  meals. Patient taking differently: Inject 0-9 Units into the skin 3 (three) times daily as needed for high blood sugar (with meals). Per sliding scale 10/18/15   Jeralyn BennettEzequiel Zamora, MD  isosorbide mononitrate (IMDUR) 30 MG 24 hr tablet Take 0.5 tablets (15 mg total) by mouth daily. 08/12/16 11/10/16  Quintella Reichertraci R Turner, MD  labetalol (NORMODYNE) 200 MG tablet Take 1 tablet (200 mg total) by mouth 2 (two) times daily. 07/11/16   Thomasene LotJames Taylor, MD  losartan (COZAAR) 100 MG tablet Take 100 mg by mouth every evening.    Historical Provider, MD  multivitamin (RENA-VIT) TABS tablet  Take 1 tablet by mouth every evening.     Historical Provider, MD  Naphazoline HCl (CLEAR EYES OP) Place 1 drop into both eyes as needed (for dry eyes).    Historical Provider, MD  oxyCODONE-acetaminophen (PERCOCET/ROXICET) 5-325 MG tablet Take 1-2 tablets by mouth every 6 (six) hours as needed for severe pain. 08/26/16   Shawn C Joy, PA-C  RaNITidine HCl (ACID REDUCER PO) Take 1 tablet by mouth as needed (for acid reflux).    Historical Provider, MD  Travoprost, BAK Free, (TRAVATAN) 0.004 % SOLN ophthalmic solution Place 1 drop into both eyes at bedtime.    Historical Provider, MD    Family History Family History  Problem Relation Age of Onset  . Diabetes Mother   . Heart disease Mother   . COPD Father   . Hypertension Father   . Lung disease Father   . Diabetes Father   . Diabetes Sister   . Breast cancer Sister     Social History Social History  Substance Use Topics  . Smoking status: Former Smoker    Types: Cigarettes    Quit date: 08/04/1999  . Smokeless tobacco: Never Used  . Alcohol use No     Allergies   Iodinated diagnostic agents and Lactose intolerance (gi)   Review of Systems Review of Systems  Constitutional: Negative for chills and fever.  Respiratory: Positive for chest tightness and shortness of breath. Negative for cough.   Cardiovascular: Positive for chest pain. Negative for palpitations and leg  swelling.  Gastrointestinal: Positive for nausea. Negative for abdominal pain, diarrhea and vomiting.  Genitourinary: Negative for dysuria, flank pain and pelvic pain.  Musculoskeletal: Negative for arthralgias, myalgias, neck pain and neck stiffness.  Skin: Negative for rash.  Neurological: Negative for dizziness, weakness and headaches.  All other systems reviewed and are negative.    Physical Exam Updated Vital Signs BP 127/78 (BP Location: Left Arm)   Pulse 66   Temp 98 F (36.7 C) (Oral)   Resp 16   Ht 5\' 6"  (1.676 m)   Wt 72.6 kg   SpO2 99%   BMI 25.82 kg/m   Physical Exam  Constitutional: She appears well-developed and well-nourished. No distress.  HENT:  Head: Normocephalic.  Eyes: Conjunctivae are normal.  Neck: Neck supple.  Cardiovascular: Normal rate, regular rhythm and normal heart sounds.   Pulmonary/Chest: Effort normal and breath sounds normal. No respiratory distress. She has no wheezes. She has no rales. She exhibits tenderness.  Dialysis catheter in right upper chest. Appears normal. Diffuse sternal chest wall tenderness.  Abdominal: Soft. Bowel sounds are normal. She exhibits no distension. There is no tenderness. There is no rebound.  Musculoskeletal: She exhibits no edema.  Neurological: She is alert.  Skin: Skin is warm and dry.  Psychiatric: She has a normal mood and affect. Her behavior is normal.  Nursing note and vitals reviewed.    ED Treatments / Results  Labs (all labs ordered are listed, but only abnormal results are displayed) Labs Reviewed  I-STAT CHEM 8, ED - Abnormal; Notable for the following:       Result Value   Sodium 133 (*)    Chloride 98 (*)    BUN 37 (*)    Creatinine, Ser 4.30 (*)    Glucose, Bld 194 (*)    Calcium, Ion 1.07 (*)    All other components within normal limits  I-STAT TROPOININ, ED - Abnormal; Notable for the following:    Troponin i, poc  0.11 (*)    All other components within normal limits  BASIC  METABOLIC PANEL  CBC WITH DIFFERENTIAL/PLATELET  HEPATIC FUNCTION PANEL  LIPASE, BLOOD    EKG  EKG Interpretation  Date/Time:  Tuesday October 15 2016 10:00:00 EST Ventricular Rate:  69 PR Interval:    QRS Duration: 82 QT Interval:  422 QTC Calculation: 453 R Axis:   -43 Text Interpretation:  Sinus rhythm Probable left atrial enlargement Left anterior fascicular block Anterior infarct, old Baseline wander in lead(s) V2 No significant change since last tracing Confirmed by FLOYD MD, DANIEL [phone]) on 10/15/2016 10:11:43 AM       Radiology Dg Chest 2 View  Result Date: 10/15/2016 CLINICAL DATA:  Chest pain following dialysis EXAM: CHEST  2 VIEW COMPARISON:  07/07/2016 FINDINGS: Cardiac shadow remains enlarged. A dialysis catheter is again identified and stable. The lungs are well aerated bilaterally. Mild linear scarring is noted in the left lung base. No focal infiltrate or sizable effusion is seen. No acute bony abnormality is noted. IMPRESSION: No active cardiopulmonary disease. Electronically Signed   By: Alcide Clever M.D.   On: 10/15/2016 11:20    Procedures Procedures (including critical care time)  Medications Ordered in ED Medications - No data to display   Initial Impression / Assessment and Plan / ED Course  I have reviewed the triage vital signs and the nursing notes.  Pertinent labs & imaging results that were available during my care of the patient were reviewed by me and considered in my medical decision making (see chart for details).  Clinical Course    Pt with known non obstructive CAD, here with chest pain from dialysis. Pt in NAD. Pain resolved after 1 SL nitroglycerine. Pt received asa and zofran. Will check labs, CXR.   Spoke with cardiology will come see  .12:30 PM Pt had more chest pain. Given another SL nitro which has helped. Pt also complaining of left ankle pain which she sprained after a fall few weeks ago. Pt takes vicodin at home for the  pain. Will give a dose here.   Pt with chronically elevated trop. Delta trop improved, doubt ACS. Will hold off of heparin. Pending cardiology consult.   4:38 PM Pt still not seen by cardiology. I did call again and verified that she is on their consult list and she is. Will continue to monitor.   5:05 PM Pt seen by Dr. Herbie Baltimore, cardiology, OK to dc home. Follow up with her pcp and cardiology.   Vitals:   10/15/16 1530 10/15/16 1600 10/15/16 1615 10/15/16 1700  BP: 178/93 185/85 186/72 162/93  Pulse: 67 66 65   Resp: 12 15 13 17   Temp:      TempSrc:      SpO2: 99% 99% 99%   Weight:      Height:         Final Clinical Impressions(s) / ED Diagnoses   Final diagnoses:  Chest pain, unspecified type    New Prescriptions Discharge Medication List as of 10/15/2016  5:06 PM       Jaynie Crumble, PA-C 10/15/16 1736    Melene Plan, DO 10/15/16 1942

## 2016-11-20 LAB — HM HIV SCREENING LAB: HM HIV Screening: NEGATIVE

## 2016-12-24 ENCOUNTER — Other Ambulatory Visit: Payer: Self-pay | Admitting: Nephrology

## 2016-12-24 ENCOUNTER — Ambulatory Visit
Admission: RE | Admit: 2016-12-24 | Discharge: 2016-12-24 | Disposition: A | Discharge status: Home / Self Care | Payer: Medicare HMO | Source: Ambulatory Visit | Attending: Nephrology | Admitting: Nephrology

## 2016-12-24 DIAGNOSIS — Z01818 Encounter for other preprocedural examination: Secondary | ICD-10-CM

## 2016-12-29 ENCOUNTER — Observation Stay (HOSPITAL_COMMUNITY)
Admission: EM | Admit: 2016-12-29 | Discharge: 2016-12-30 | Disposition: A | Discharge status: Home / Self Care | Payer: Medicare HMO | Attending: Internal Medicine | Admitting: Internal Medicine

## 2016-12-29 ENCOUNTER — Encounter (HOSPITAL_COMMUNITY): Payer: Self-pay

## 2016-12-29 ENCOUNTER — Emergency Department (HOSPITAL_COMMUNITY): Payer: Medicare HMO

## 2016-12-29 DIAGNOSIS — R748 Abnormal levels of other serum enzymes: Secondary | ICD-10-CM | POA: Diagnosis not present

## 2016-12-29 DIAGNOSIS — Z992 Dependence on renal dialysis: Secondary | ICD-10-CM | POA: Diagnosis not present

## 2016-12-29 DIAGNOSIS — R112 Nausea with vomiting, unspecified: Secondary | ICD-10-CM | POA: Insufficient documentation

## 2016-12-29 DIAGNOSIS — I2510 Atherosclerotic heart disease of native coronary artery without angina pectoris: Secondary | ICD-10-CM | POA: Insufficient documentation

## 2016-12-29 DIAGNOSIS — N186 End stage renal disease: Secondary | ICD-10-CM | POA: Diagnosis not present

## 2016-12-29 DIAGNOSIS — Z79899 Other long term (current) drug therapy: Secondary | ICD-10-CM | POA: Diagnosis not present

## 2016-12-29 DIAGNOSIS — R079 Chest pain, unspecified: Secondary | ICD-10-CM | POA: Diagnosis not present

## 2016-12-29 DIAGNOSIS — I10 Essential (primary) hypertension: Secondary | ICD-10-CM

## 2016-12-29 DIAGNOSIS — E11319 Type 2 diabetes mellitus with unspecified diabetic retinopathy without macular edema: Secondary | ICD-10-CM | POA: Insufficient documentation

## 2016-12-29 DIAGNOSIS — N189 Chronic kidney disease, unspecified: Secondary | ICD-10-CM

## 2016-12-29 DIAGNOSIS — I110 Hypertensive heart disease with heart failure: Secondary | ICD-10-CM | POA: Diagnosis present

## 2016-12-29 DIAGNOSIS — R0789 Other chest pain: Secondary | ICD-10-CM | POA: Diagnosis not present

## 2016-12-29 DIAGNOSIS — I120 Hypertensive chronic kidney disease with stage 5 chronic kidney disease or end stage renal disease: Secondary | ICD-10-CM | POA: Diagnosis not present

## 2016-12-29 DIAGNOSIS — E1129 Type 2 diabetes mellitus with other diabetic kidney complication: Secondary | ICD-10-CM | POA: Diagnosis present

## 2016-12-29 DIAGNOSIS — R7989 Other specified abnormal findings of blood chemistry: Secondary | ICD-10-CM | POA: Diagnosis present

## 2016-12-29 DIAGNOSIS — Z7982 Long term (current) use of aspirin: Secondary | ICD-10-CM | POA: Diagnosis not present

## 2016-12-29 DIAGNOSIS — E1122 Type 2 diabetes mellitus with diabetic chronic kidney disease: Secondary | ICD-10-CM | POA: Diagnosis not present

## 2016-12-29 DIAGNOSIS — Z794 Long term (current) use of insulin: Secondary | ICD-10-CM | POA: Insufficient documentation

## 2016-12-29 DIAGNOSIS — D631 Anemia in chronic kidney disease: Secondary | ICD-10-CM | POA: Diagnosis present

## 2016-12-29 DIAGNOSIS — R778 Other specified abnormalities of plasma proteins: Secondary | ICD-10-CM | POA: Diagnosis present

## 2016-12-29 LAB — CBC
HCT: 35.3 % — ABNORMAL LOW (ref 36.0–46.0)
HCT: 35 % — ABNORMAL LOW (ref 36.0–46.0)
HEMOGLOBIN: 10.9 g/dL — AB (ref 12.0–15.0)
Hemoglobin: 10.8 g/dL — ABNORMAL LOW (ref 12.0–15.0)
MCH: 28.6 pg (ref 26.0–34.0)
MCH: 28.3 pg (ref 26.0–34.0)
MCHC: 30.9 g/dL (ref 30.0–36.0)
MCHC: 30.9 g/dL (ref 30.0–36.0)
MCV: 92.7 fL (ref 78.0–100.0)
MCV: 91.9 fL (ref 78.0–100.0)
PLATELETS: 377 10*3/uL (ref 150–400)
Platelets: 362 10*3/uL (ref 150–400)
RBC: 3.81 MIL/uL — ABNORMAL LOW (ref 3.87–5.11)
RBC: 3.81 MIL/uL — AB (ref 3.87–5.11)
RDW: 19.8 % — ABNORMAL HIGH (ref 11.5–15.5)
RDW: 20.1 % — AB (ref 11.5–15.5)
WBC: 6.3 10*3/uL (ref 4.0–10.5)
WBC: 6.4 10*3/uL (ref 4.0–10.5)

## 2016-12-29 LAB — RENAL FUNCTION PANEL
Albumin: 3.7 g/dL (ref 3.5–5.0)
Anion gap: 20 — ABNORMAL HIGH (ref 5–15)
BUN: 38 mg/dL — AB (ref 6–20)
CALCIUM: 9 mg/dL (ref 8.9–10.3)
CO2: 22 mmol/L (ref 22–32)
CREATININE: 6.7 mg/dL — AB (ref 0.44–1.00)
Chloride: 90 mmol/L — ABNORMAL LOW (ref 101–111)
GFR, EST AFRICAN AMERICAN: 6 mL/min — AB (ref >60)
GFR, EST NON AFRICAN AMERICAN: 6 mL/min — AB (ref >60)
Glucose, Bld: 82 mg/dL (ref 65–99)
Phosphorus: 3.4 mg/dL (ref 2.5–4.6)
Potassium: 5 mmol/L (ref 3.5–5.1)
SODIUM: 132 mmol/L — AB (ref 135–145)

## 2016-12-29 LAB — BASIC METABOLIC PANEL
ANION GAP: 21 — AB (ref 5–15)
BUN: 35 mg/dL — AB (ref 6–20)
CALCIUM: 9.1 mg/dL (ref 8.9–10.3)
CO2: 21 mmol/L — AB (ref 22–32)
CREATININE: 6.4 mg/dL — AB (ref 0.44–1.00)
Chloride: 90 mmol/L — ABNORMAL LOW (ref 101–111)
GFR calc Af Amer: 7 mL/min — ABNORMAL LOW (ref >60)
GFR calc non Af Amer: 6 mL/min — ABNORMAL LOW (ref >60)
GLUCOSE: 110 mg/dL — AB (ref 65–99)
Potassium: 5.1 mmol/L (ref 3.5–5.1)
Sodium: 132 mmol/L — ABNORMAL LOW (ref 135–145)

## 2016-12-29 LAB — I-STAT TROPONIN, ED
TROPONIN I, POC: 0.1 ng/mL — AB (ref 0.00–0.08)
Troponin i, poc: 0.11 ng/mL (ref 0.00–0.08)

## 2016-12-29 LAB — TROPONIN I: TROPONIN I: 0.19 ng/mL — AB (ref <0.03)

## 2016-12-29 LAB — GLUCOSE, CAPILLARY: GLUCOSE-CAPILLARY: 89 mg/dL (ref 65–99)

## 2016-12-29 MED ORDER — CALCIUM ACETATE (PHOS BINDER) 667 MG PO CAPS: 667.0000 mg | ORAL_CAPSULE | ORAL | Status: DC | PRN

## 2016-12-29 MED ORDER — GABAPENTIN 100 MG PO CAPS
100.0000 mg | ORAL_CAPSULE | Freq: Three times a day (TID) | ORAL | Status: DC
  Administered 2016-12-29 – 2016-12-30 (×3): 100 mg via ORAL
  Filled 2016-12-29 (×3): qty 1

## 2016-12-29 MED ORDER — LIDOCAINE HCL (PF) 1 % IJ SOLN: 5.0000 mL | INTRAMUSCULAR | Status: DC | PRN

## 2016-12-29 MED ORDER — CALCIUM CARBONATE ANTACID 1250 MG/5ML PO SUSP
500.0000 mg | Freq: Four times a day (QID) | ORAL | Status: DC | PRN
  Filled 2016-12-29: qty 5

## 2016-12-29 MED ORDER — LATANOPROST 0.005 % OP SOLN
1.0000 [drp] | Freq: Every day | OPHTHALMIC | Status: DC
  Administered 2016-12-29 – 2016-12-30 (×2): 1 [drp] via OPHTHALMIC
  Filled 2016-12-29: qty 2.5

## 2016-12-29 MED ORDER — CINACALCET HCL 30 MG PO TABS
60.0000 mg | ORAL_TABLET | ORAL | Status: DC
Start: 2017-01-01 — End: 2016-12-31

## 2016-12-29 MED ORDER — DOCUSATE SODIUM 283 MG RE ENEM
1.0000 | ENEMA | RECTAL | Status: DC | PRN
  Filled 2016-12-29: qty 1

## 2016-12-29 MED ORDER — ISOSORBIDE MONONITRATE ER 30 MG PO TB24
15.0000 mg | ORAL_TABLET | Freq: Every day | ORAL | Status: DC
  Filled 2016-12-29: qty 1

## 2016-12-29 MED ORDER — CYCLOBENZAPRINE HCL 10 MG PO TABS
5.0000 mg | ORAL_TABLET | Freq: Two times a day (BID) | ORAL | Status: DC
  Administered 2016-12-29 – 2016-12-30 (×3): 5 mg via ORAL
  Filled 2016-12-29 (×3): qty 1

## 2016-12-29 MED ORDER — ONDANSETRON HCL 4 MG/2ML IJ SOLN: 4.0000 mg | Freq: Three times a day (TID) | INTRAMUSCULAR | Status: DC | PRN

## 2016-12-29 MED ORDER — ZOLPIDEM TARTRATE 5 MG PO TABS
5.0000 mg | ORAL_TABLET | Freq: Every evening | ORAL | Status: DC | PRN
  Administered 2016-12-30: 5 mg via ORAL
  Filled 2016-12-29: qty 1

## 2016-12-29 MED ORDER — ONDANSETRON 4 MG PO TBDP
4.0000 mg | ORAL_TABLET | Freq: Once | ORAL | Status: AC
  Administered 2016-12-29: 4 mg via ORAL
  Filled 2016-12-29: qty 1

## 2016-12-29 MED ORDER — INSULIN ASPART 100 UNIT/ML ~~LOC~~ SOLN: 0.0000 [IU] | Freq: Three times a day (TID) | SUBCUTANEOUS | Status: DC

## 2016-12-29 MED ORDER — OMEGA-3-ACID ETHYL ESTERS 1 G PO CAPS
1.0000 g | ORAL_CAPSULE | Freq: Every day | ORAL | Status: DC
  Administered 2016-12-29 – 2016-12-30 (×2): 1 g via ORAL
  Filled 2016-12-29 (×2): qty 1

## 2016-12-29 MED ORDER — HYDROXYZINE HCL 25 MG PO TABS: 25.0000 mg | ORAL_TABLET | Freq: Three times a day (TID) | ORAL | Status: DC | PRN

## 2016-12-29 MED ORDER — HEPARIN SODIUM (PORCINE) 1000 UNIT/ML DIALYSIS
1000.0000 [IU] | INTRAMUSCULAR | Status: DC | PRN
  Filled 2016-12-29: qty 1

## 2016-12-29 MED ORDER — AMLODIPINE BESYLATE 10 MG PO TABS
10.0000 mg | ORAL_TABLET | Freq: Every day | ORAL | Status: DC
  Administered 2016-12-29 – 2016-12-30 (×2): 10 mg via ORAL
  Filled 2016-12-29 (×2): qty 1

## 2016-12-29 MED ORDER — ASPIRIN 81 MG PO CHEW
324.0000 mg | CHEWABLE_TABLET | Freq: Once | ORAL | Status: AC
  Administered 2016-12-29: 324 mg via ORAL
  Filled 2016-12-29: qty 4

## 2016-12-29 MED ORDER — OXYCODONE-ACETAMINOPHEN 5-325 MG PO TABS
1.0000 | ORAL_TABLET | Freq: Four times a day (QID) | ORAL | Status: DC | PRN
  Administered 2016-12-29 – 2016-12-30 (×3): 2 via ORAL
  Filled 2016-12-29 (×2): qty 2

## 2016-12-29 MED ORDER — HYDROCODONE-ACETAMINOPHEN 5-325 MG PO TABS
1.0000 | ORAL_TABLET | Freq: Four times a day (QID) | ORAL | Status: DC | PRN
  Administered 2016-12-30: 1 via ORAL
  Filled 2016-12-29: qty 1

## 2016-12-29 MED ORDER — SODIUM CHLORIDE 0.9 % IV SOLN
125.0000 mg | INTRAVENOUS | Status: DC
  Filled 2016-12-29: qty 10

## 2016-12-29 MED ORDER — LABETALOL HCL 200 MG PO TABS
300.0000 mg | ORAL_TABLET | ORAL | Status: DC
  Administered 2016-12-29 – 2016-12-30 (×2): 300 mg via ORAL
  Filled 2016-12-29 (×2): qty 1

## 2016-12-29 MED ORDER — GI COCKTAIL ~~LOC~~: 30.0000 mL | Freq: Four times a day (QID) | ORAL | Status: DC | PRN

## 2016-12-29 MED ORDER — TRAMADOL HCL 50 MG PO TABS
50.0000 mg | ORAL_TABLET | Freq: Once | ORAL | Status: AC
  Administered 2016-12-29: 50 mg via ORAL
  Filled 2016-12-29: qty 1

## 2016-12-29 MED ORDER — SORBITOL 70 % SOLN: 30.0000 mL | Status: DC | PRN

## 2016-12-29 MED ORDER — PENTAFLUOROPROP-TETRAFLUOROETH EX AERO: 1.0000 "application " | INHALATION_SPRAY | CUTANEOUS | Status: DC | PRN

## 2016-12-29 MED ORDER — ONDANSETRON HCL 4 MG/2ML IJ SOLN: 4.0000 mg | Freq: Four times a day (QID) | INTRAMUSCULAR | Status: DC | PRN

## 2016-12-29 MED ORDER — CALCIUM ACETATE (PHOS BINDER) 667 MG PO CAPS
1334.0000 mg | ORAL_CAPSULE | Freq: Three times a day (TID) | ORAL | Status: DC
  Administered 2016-12-30 (×2): 1334 mg via ORAL
  Filled 2016-12-29 (×2): qty 2

## 2016-12-29 MED ORDER — ONDANSETRON HCL 4 MG PO TABS: 4.0000 mg | ORAL_TABLET | Freq: Four times a day (QID) | ORAL | Status: DC | PRN

## 2016-12-29 MED ORDER — ENOXAPARIN SODIUM 30 MG/0.3ML ~~LOC~~ SOLN
30.0000 mg | SUBCUTANEOUS | Status: DC
  Administered 2016-12-29 – 2016-12-30 (×2): 30 mg via SUBCUTANEOUS
  Filled 2016-12-29 (×2): qty 0.3

## 2016-12-29 MED ORDER — MORPHINE SULFATE (PF) 2 MG/ML IV SOLN: 2.0000 mg | INTRAVENOUS | Status: DC | PRN

## 2016-12-29 MED ORDER — ACETAMINOPHEN 325 MG PO TABS: 650.0000 mg | ORAL_TABLET | Freq: Four times a day (QID) | ORAL | Status: DC | PRN

## 2016-12-29 MED ORDER — RENA-VITE PO TABS
1.0000 | ORAL_TABLET | Freq: Every day | ORAL | Status: DC
  Administered 2016-12-29 – 2016-12-30 (×2): 1 via ORAL
  Filled 2016-12-29 (×2): qty 1

## 2016-12-29 MED ORDER — ASPIRIN EC 81 MG PO TBEC
81.0000 mg | DELAYED_RELEASE_TABLET | Freq: Every day | ORAL | Status: DC
  Administered 2016-12-29 – 2016-12-30 (×2): 81 mg via ORAL
  Filled 2016-12-29 (×2): qty 1

## 2016-12-29 MED ORDER — SODIUM CHLORIDE 0.9 % IV SOLN: 100.0000 mL | INTRAVENOUS | Status: DC | PRN

## 2016-12-29 MED ORDER — ACETAMINOPHEN 650 MG RE SUPP: 650.0000 mg | Freq: Four times a day (QID) | RECTAL | Status: DC | PRN

## 2016-12-29 MED ORDER — LIDOCAINE-PRILOCAINE 2.5-2.5 % EX CREA
1.0000 "application " | TOPICAL_CREAM | CUTANEOUS | Status: DC | PRN
  Filled 2016-12-29: qty 5

## 2016-12-29 MED ORDER — FENOFIBRATE 160 MG PO TABS: 160.0000 mg | ORAL_TABLET | Freq: Every day | ORAL | Status: DC

## 2016-12-29 MED ORDER — ALTEPLASE 2 MG IJ SOLR: 2.0000 mg | Freq: Once | INTRAMUSCULAR | Status: DC | PRN

## 2016-12-29 MED ORDER — NEPRO/CARBSTEADY PO LIQD
237.0000 mL | Freq: Three times a day (TID) | ORAL | Status: DC | PRN
  Filled 2016-12-29: qty 237

## 2016-12-29 MED ORDER — CAMPHOR-MENTHOL 0.5-0.5 % EX LOTN
1.0000 "application " | TOPICAL_LOTION | Freq: Three times a day (TID) | CUTANEOUS | Status: DC | PRN
  Filled 2016-12-29: qty 222

## 2016-12-29 MED ORDER — LOSARTAN POTASSIUM 50 MG PO TABS
100.0000 mg | ORAL_TABLET | Freq: Every day | ORAL | Status: DC
  Administered 2016-12-29 – 2016-12-30 (×2): 100 mg via ORAL
  Filled 2016-12-29 (×2): qty 2

## 2016-12-29 NOTE — ED Notes (Signed)
Pt reports 0/10 chest pain at this time. Continues to have pain related to arthritis.

## 2016-12-29 NOTE — ED Notes (Signed)
Pt continues to complain of 4/10 chest pain, Dr. Ophelia CharterYates notified, states she will see pt in the ED prior to transport. Admitting RN aware.

## 2016-12-29 NOTE — H&P (Signed)
History and Physical    Colleen Mullins ZOX:[id number] DOB: [birth year] DOA: 12/29/2016  PCP: Colleen Shutter, MD Consultants:  Colleen Mullins - Nephrology; Colleen Mullins - cardiology Patient coming from: home - lives with daughter and Colleen Mullins; [place]: daughter, [phone]  Chief Complaint: chest pain  HPI: Colleen Mullins is a 71 y.o. female with medical history significant of ESRD on MWF HD; PAD; HTN; HLD; diastolic dysfunction; CAD; and DM presenting with chest pain.  Patient awoke this AM with nausea.  Glucose 108.  Unable to eat before she got very nauseated.  (Had 2 beef hot dogs last night).  Had a BM.  Vomited and developed chest pain.  +SOB, went and put O2 on.  More n/v - dry heaves.  Chest pain worsened.  Substernal.  Chest pain is now gone thanks to pain medication (Ultram).  Arthritis pain is ongoing.    The patient was hospitalized with atypical chest pain in 8/17.  Her troponin trended up to 0.23.  Echo showed preserved EF with grade 2 diastolic dysfunction.  Nuclear stress test showed a large region of reversible ischemia within the anterior and anterolateral wall with global hypokinesis and so a heart catheterization was performed which showed mild to moderate non-obstructive CAD.   ED Course: Per Dr. Hyacinth Meeker: EKG is persistently abnormal overtime with multiple Q waves and nonspecific T waves but there is no change from prior, troponin is elevated but this could be consistent with renal disease and 48 hours since dialysis. The patient is at risk for cardiac disease given her underlying medical problems and a heart catheterization showing some mild to moderate obstructive disease. We'll give nausea, some aspirin, some pain medication, anticipate admission to the hospital. For rule out  D/w Dr. Ophelia Charter who will admit   Review of Systems: As per HPI; otherwise 10 point review of systems reviewed and negative.   Ambulatory Status:  ambulates with a walker, falls alot  Past Medical History:    Diagnosis Date  . Anemia    low iron  . Arthritis   . CAD in native artery 08/12/2016   a. Nonobstructive ASCAD by cath with 60% D1 and 50% PL off RCA and 25% mid LAD.  . Diabetes mellitus    type 2  . Diabetic retinopathy (HCC)   . Dialysis patient [facility])    M-W-F @ [facility]  . Diastolic dysfunction   . Ejection fraction   . ESRD on hemodialysis [facility])    Dialysis T/Th/Sa  . GERD (gastroesophageal reflux disease)   . Glaucoma   . H/O: Bell's palsy 2007  . Hyperlipidemia   . Hypertension    a. labile - hx of BP dropping at HD.  . Macular degeneration   . PAD (peripheral artery disease) (HCC)    a. ABI 08/2016: mod RLE disease, normal L ABI.    Past Surgical History:  Procedure Laterality Date  . ABDOMINAL HYSTERECTOMY    . ARTERIOVENOUS GRAFT PLACEMENT     Left forearm x 2, one removed in March  . ARTERIOVENOUS GRAFT PLACEMENT     Left forearm  . AVGG REMOVAL  10/08/2011   Procedure: REMOVAL OF ARTERIOVENOUS GORETEX GRAFT (AVGG);  Surgeon: Sherren Kerns, MD;  Location: [facility] OR;  Service: Vascular;  Laterality: Left;  . BTL    . CARDIAC CATHETERIZATION N/A 07/10/2016   Procedure: Left Heart Cath and Coronary Angiography;  Surgeon: Yvonne Kendall, MD;  Location: [facility] INVASIVE CV LAB;  Service: Cardiovascular;  Laterality: N/A;  . COLONOSCOPY    .  EXCHANGE OF A DIALYSIS CATHETER Right 03/27/2016   Procedure: EXCHANGE OF A DIALYSIS CATHETER;  Surgeon: Fransisco HertzBrian L Chen, MD;  [facility];  Service: Vascular;  Laterality: Right;  . EYE SURGERY Left    Lasik surgery on left. Cataract surgery on both eyes  . LAPAROTOMY  ?2000   Pelvic mass (Benign)  . REVISION OF ARTERIOVENOUS GORETEX GRAFT Left 03/27/2016   Procedure: REVISION OF LEFT ARTERIOVENOUS GORETEX GRAFT;  Surgeon: Fransisco HertzBrian L Chen, MD;  [facility];  Service: Vascular;  Laterality: Left;  . TONSILLECTOMY      Social History   Social History  . Marital status: Widowed    Spouse name: N/A  . Number of children: N/A   . Years of education: N/A   Occupational History  . retired    Social History Main Topics  . Smoking status: Former Smoker    Types: Cigarettes    Quit date: 08/04/1999  . Smokeless tobacco: Never Used  . Alcohol use No  . Drug use: No  . Sexual activity: Not on file   Other Topics Concern  . Not on file   Social History Narrative  . No narrative on file    Allergies  Allergen Reactions  . Iodinated Diagnostic Agents Hives and Itching  . Lactose Intolerance (Gi) Other (See Comments)    Abdominal pains and bloating    Family History  Problem Relation Age of Onset  . Diabetes Mother   . Heart disease Mother   . COPD Father   . Hypertension Father   . Lung disease Father   . Diabetes Father   . Diabetes Sister   . Breast cancer Sister     Prior to Admission medications   Medication Sig Start Date End Date Taking? Authorizing Provider  acetaminophen (TYLENOL) 650 MG CR tablet Take 650 mg by mouth every 6 (six) hours.   Yes Historical Provider, MD  amLODipine (NORVASC) 10 MG tablet Take 10 mg by mouth at bedtime.    Yes Historical Provider, MD  aspirin EC 81 MG tablet Take 81 mg by mouth at bedtime.    Yes Historical Provider, MD  calcium acetate (PHOSLO) 667 MG capsule Take 667-1,334 mg by mouth See admin instructions. Take 2 capsules (1334 mg) by mouth 3 times daily with meals and 1 capsule (667 mg) with snacks   Yes Historical Provider, MD  cinacalcet (SENSIPAR) 60 MG tablet Take 60 mg by mouth 2 (two) times a week. Wednesdays and Saturdays   Yes Historical Provider, MD  cyclobenzaprine (FLEXERIL) 5 MG tablet Take 1 tablet (5 mg total) by mouth 2 (two) times daily as needed for muscle spasms. Patient taking differently: Take 5 mg by mouth 2 (two) times daily.  08/26/16  Yes Shawn C Joy, PA-C  fenofibrate (TRICOR) 145 MG tablet Take 145 mg by mouth daily at 12 noon.    Yes Historical Provider, MD  fish oil-omega-3 fatty acids 1000 MG capsule Take 1 g by mouth at bedtime.     Yes Historical Provider, MD  HYDROcodone-acetaminophen (NORCO/VICODIN) 5-325 MG tablet Take 1 tablet by mouth every 6 (six) hours as needed for pain. 09/19/16  Yes Historical Provider, MD  insulin aspart (NOVOLOG) 100 UNIT/ML injection Inject 0-9 Units into the skin 3 (three) times daily with meals. Patient taking differently: Inject 2-5 Units into the skin 3 (three) times daily as needed for high blood sugar (CBG >150). Per sliding scale 10/18/15  Yes Jeralyn BennettEzequiel Zamora, MD  isosorbide mononitrate (IMDUR)  30 MG 24 hr tablet Take 15 mg by mouth daily at 12 noon.   Yes Historical Provider, MD  labetalol (NORMODYNE) 300 MG tablet Take 300 mg by mouth See admin instructions. Take 1 tablet (300 mg) by mouth twice daily - at noon and at bedtime 10/01/16  Yes Historical Provider, MD  losartan (COZAAR) 100 MG tablet Take 100 mg by mouth at bedtime.    Yes Historical Provider, MD  multivitamin (RENA-VIT) TABS tablet Take 1 tablet by mouth at bedtime.    Yes Historical Provider, MD  Naphazoline HCl (CLEAR EYES OP) Place 1 drop into both eyes as needed (for dry eyes).   Yes Historical Provider, MD  oxyCODONE-acetaminophen (PERCOCET/ROXICET) 5-325 MG tablet Take 1-2 tablets by mouth every 6 (six) hours as needed for severe pain. 08/26/16  Yes Shawn C Joy, PA-C  OXYGEN Inhale into the lungs as needed (shortness of breath).   Yes Historical Provider, MD  RaNITidine HCl (ZANTAC PO) Take 1 tablet by mouth daily at 12 noon.   Yes Historical Provider, MD  Travoprost, BAK Free, (TRAVATAN) 0.004 % SOLN ophthalmic solution Place 1 drop into both eyes at bedtime.   Yes Historical Provider, MD  diazepam (VALIUM) 5 MG tablet Take 0.5 tablets (2.5 mg total) by mouth every 6 (six) hours as needed for muscle spasms. Patient not taking: Reported on 09/25/2016 08/28/16   Raeford Razor, MD  gabapentin (NEURONTIN) 100 MG capsule Take 1 capsule (100 mg total) by mouth 3 (three) times daily. Patient not taking: Reported on 12/29/2016  08/30/16   Audry Pili, PA-C  isosorbide mononitrate (IMDUR) 30 MG 24 hr tablet Take 0.5 tablets (15 mg total) by mouth daily. 08/12/16 11/10/16  Quintella Reichert, MD  labetalol (NORMODYNE) 200 MG tablet Take 1 tablet (200 mg total) by mouth 2 (two) times daily. 07/11/16   Thomasene Lot, MD    Physical Exam: Vitals:   12/29/16 2000 12/29/16 2038 12/29/16 2211 12/29/16 2223  BP: 169/88 (!) 180/82 (!) 180/82 (!) 180/82  Pulse: 94 94  90  Resp: 20     Temp:  98 F (36.7 C)    TempSrc:  Oral    SpO2: 100% 100%    Weight:  75.3 kg (166 lb 1.6 oz)       General:  Appears calm and comfortable and is NAD Eyes:  PERRL, EOMI, normal lids, iris ENT:  grossly normal hearing, lips & tongue, mmm Neck:  no LAD, masses or thyromegaly Cardiovascular:  RRR, no m/r/g. No LE edema.  Respiratory:  CTA bilaterally, no w/r/r. Normal respiratory effort. Abdomen:  soft, ntnd, NABS Skin:  no rash or induration seen on limited exam Musculoskeletal:  grossly normal tone BUE/BLE, good ROM, no bony abnormality Psychiatric:  grossly normal mood and affect, speech fluent and appropriate, AOx3 Neurologic:  CN 2-12 grossly intact, moves all extremities in coordinated fashion, sensation intact  Labs on Admission: I have personally reviewed following labs and imaging studies  CBC:  Recent Labs Lab 12/29/16 1558 12/29/16 2132  WBC 6.3 6.4  HGB 10.9* 10.8*  HCT 35.3* 35.0*  MCV 92.7 91.9  PLT 362 377   Basic Metabolic Panel:  Recent Labs Lab 12/29/16 1558 12/29/16 2132  NA 132* 132*  K 5.1 5.0  CL 90* 90*  CO2 21* 22  GLUCOSE 110* 82  BUN 35* 38*  CREATININE 6.40* 6.70*  CALCIUM 9.1 9.0  PHOS  --  3.4   GFR: Estimated Creatinine Clearance: 8 mL/min (by C-G formula  based on SCr of 6.7 mg/dL (H)). Liver Function Tests:  Recent Labs Lab 12/29/16 2132  ALBUMIN 3.7   No results for input(s): LIPASE, AMYLASE in the last 168 hours. No results for input(s): AMMONIA in the last 168  hours. Coagulation Profile: No results for input(s): INR, PROTIME in the last 168 hours. Cardiac Enzymes:  Recent Labs Lab 12/29/16 2132  TROPONINI 0.19*   BNP (last 3 results) No results for input(s): PROBNP in the last 8760 hours. HbA1C: No results for input(s): HGBA1C in the last 72 hours. CBG:  Recent Labs Lab 12/29/16 2123  GLUCAP 89   Lipid Profile: No results for input(s): CHOL, HDL, LDLCALC, TRIG, CHOLHDL, LDLDIRECT in the last 72 hours. Thyroid Function Tests: No results for input(s): TSH, T4TOTAL, FREET4, T3FREE, THYROIDAB in the last 72 hours. Anemia Panel: No results for input(s): VITAMINB12, FOLATE, FERRITIN, TIBC, IRON, RETICCTPCT in the last 72 hours. Urine analysis:    Component Value Date/Time   COLORURINE YELLOW 10/16/2015 1847   APPEARANCEUR CLEAR 10/16/2015 1847   LABSPEC 1.013 10/16/2015 1847   PHURINE 8.5 (H) 10/16/2015 1847   GLUCOSEU NEGATIVE 10/16/2015 1847   HGBUR SMALL (A) 10/16/2015 1847   BILIRUBINUR NEGATIVE 10/16/2015 1847   KETONESUR NEGATIVE 10/16/2015 1847   PROTEINUR >300 (A) 10/16/2015 1847   UROBILINOGEN 0.2 12/11/2012 0719   NITRITE NEGATIVE 10/16/2015 1847   LEUKOCYTESUR NEGATIVE 10/16/2015 1847    Creatinine Clearance: Estimated Creatinine Clearance: 8 mL/min (by C-G formula based on SCr of 6.7 mg/dL (H)).  Sepsis Labs: @LABRCNTIP (procalcitonin:4,lacticidven:4) )No results found for this or any previous visit (from the past 240 hour(s)).   Radiological Exams on Admission: Dg Chest 2 View  Result Date: 12/29/2016 CLINICAL DATA:  Mid chest pain. EXAM: CHEST  2 VIEW COMPARISON:  December 24, 2016 FINDINGS: Mild atelectasis in the left lung base. Stable dialysis catheter. Stable cardiomegaly. No other interval change or acute abnormality. IMPRESSION: No active cardiopulmonary disease. Electronically Signed   By: Gerome Sam III M.D   On: 12/29/2016 16:36    EKG: Independently reviewed.  NSR with rate 89; nonspecific ST  changes with no evidence of acute ischemia and NSCSLT  Assessment/Plan Principal Problem:   Chest pain Active Problems:   Type 2 diabetes mellitus with renal complication (HCC)   Elevated troponin   ESRD on dialysis (HCC)   Benign essential HTN   Anemia in chronic renal disease   Chest pain -Patient with substernal chest pain that came on at rest and resolved with Ultram. -1/3 typical symptoms suggestive of noncardiac chest pain.  -CXR unremarkable.   -Initial cardiac troponins positive at 0.10 and repeats are 0.11 and 0.19 (may be falsely elevated due to ESRD).   -EKG not indicative of acute ischemia.   -TIMI risk score is 4; which predicts a 14 days risk of death, recurrent MI, or urgent revascularization of 19.9%.  -Will plan to place in observation status on telemetry to rule out ACS by overnight observation.  -Continue to cycle troponin q6h and repeat EKG in AM -Continue ASA 81 mg -morphine given -She is not taking a statin; will continue Tricor and fish oil for now, but consider addition of a statin prior to discharge -Continue Imdur -Risk factor stratification with HgbA1c and FLP; will also check TSH -Based on prior recent heart cath, would not plan to consult cardiology unless her symptoms are ongoing or worsening  HTN -Takes Norvasc, Labetolol, Cozaar at home -Patient with suboptimal control while in the ER; she reports  that this is her norm -Will add prn IV hydralazine  DM -Glucose 82 -Diet controlled -Last A1c was 6.4 in 5/17; will repeat -There is no indication to start medication at this time, but will follow and add SSI if needed  ESRD on HD -Patient is on MWF HD -She was briefly discussed with Dr. Signe Colt, who will add her to the HD maintenance schedule for tomorrow -Nephrology prn order set utilized -Continue Rena-Vit, Sensipar, and Phoslo -Creatinine 6.70  Anemia Hgb 10.8, stable, normocytic   DVT prophylaxis: Lovenox  Code Status:  Full - confirmed  with patient/family Family Communication: Husband present  Disposition Plan:  Home once clinically improved Consults called: Nephrology  Admission status: It is my clinical opinion that referral for OBSERVATION is reasonable and necessary in this patient based on the above information provided. The aforementioned taken together are felt to place the patient at high risk for further clinical deterioration. However it is anticipated that the patient may be medically stable for discharge from the hospital within 24 to 48 hours.    Jonah Blue MD [facility]  If 7PM-7AM, please contact night-coverage [email or web address] Password [email or web address]  12/30/2016, 12:31 AM

## 2016-12-29 NOTE — ED Notes (Signed)
Pt staest she fell on Friday scraping he right knee.

## 2016-12-29 NOTE — ED Provider Notes (Signed)
[facility]-EMERGENCY DEPT Provider Note   CSN: [id number] Arrival date & time: 12/29/16  1532     History   Chief Complaint Chief Complaint  Patient presents with  . Chest Pain  . vomiting/ body aches    HPI Colleen Mullins is a 71 y.o. female.  HPI  The patient is a 71 year old female, she is a dialysis patient for the last 7 years on Monday Wednesday and Friday, she presents today after stating that earlier today while she was getting ready for church she developed pain in the mid chest without any radiation, she was short of breath and nauseated and actually vomited, she is stated nauseated with some chest discomfort throughout the day though this is gradually improving. She was told that she had a heart catheterization recently where she had a 60% lesion in 50% lesion and a 25% lesion however no interventions were done and the patient was doing well at that time. At this time the patient's symptoms are mild. She denies leg swelling though she does have some chronic pain in her left ankle after a fall. That was several months ago. She is artery been seen by the orthopedist.  Past Medical History:  Diagnosis Date  . Anemia    low iron  . Arthritis   . CAD in native artery 08/12/2016   a. Nonobstructive ASCAD by cath with 60% D1 and 50% PL off RCA and 25% mid LAD.  . Diabetes mellitus    type 2  . Diabetic retinopathy (HCC)   . Dialysis patient [facility](HCC)    M-W-F @ [facility]  . Diastolic dysfunction   . Ejection fraction   . ESRD on hemodialysis [facility](HCC)    Dialysis T/Th/Sa  . GERD (gastroesophageal reflux disease)   . Glaucoma   . H/O: Bell's palsy 2007  . Hyperlipidemia   . Hypertension    a. labile - hx of BP dropping at HD.  . Macular degeneration   . PAD (peripheral artery disease) (HCC)    a. ABI 08/2016: mod RLE disease, normal L ABI.    Patient Active Problem List   Diagnosis Date Noted  . Chest pain 12/29/2016  . CAD in native artery 08/12/2016  . Abnormal nuclear  stress test 07/10/2016  . Musculoskeletal chest pain   . Anemia, unspecified 07/08/2016  . Pain in the chest 07/07/2016  . Dyspnea on exertion 04/14/2016  . SOB (shortness of breath) 04/14/2016  . ESRD on dialysis (HCC) 04/14/2016  . Benign essential HTN 04/14/2016  . Hyperlipidemia 04/14/2016  . 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 Hypoglycemia 10/16/2015  . Elevated troponin 10/16/2015  . Ejection fraction   . Type 2 diabetes mellitus with renal complication (HCC) 09/26/2011  . Other complications due to renal dialysis device, implant, and graft 09/26/2011  . End stage renal disease (HCC) 09/26/2011    Past Surgical History:  Procedure Laterality Date  . ABDOMINAL HYSTERECTOMY    . ARTERIOVENOUS GRAFT PLACEMENT     Left forearm x 2, one removed in March  . ARTERIOVENOUS GRAFT PLACEMENT     Left forearm  . AVGG REMOVAL  10/08/2011   Procedure: REMOVAL OF ARTERIOVENOUS GORETEX GRAFT (AVGG);  Surgeon: Sherren Kernsharles E Fields, MD;  [facility];  Service: Vascular;  Laterality: Left;  . BTL    . CARDIAC CATHETERIZATION N/A 07/10/2016   Procedure: Left Heart Cath and Coronary Angiography;  Surgeon: Yvonne Kendallhristopher End, MD;  [facility];  Service: Cardiovascular;  Laterality: N/A;  .  COLONOSCOPY    . EXCHANGE OF A DIALYSIS CATHETER Right 03/27/2016   Procedure: EXCHANGE OF A DIALYSIS CATHETER;  Surgeon: Fransisco Hertz, MD;  Location[facility];  Service: Vascular;  Laterality: Right;  . EYE SURGERY Left    Lasik surgery on left. Cataract surgery on both eyes  . LAPAROTOMY  ?2000   Pelvic mass (Benign)  . REVISION OF ARTERIOVENOUS GORETEX GRAFT Left 03/27/2016   Procedure: REVISION OF LEFT ARTERIOVENOUS GORETEX GRAFT;  Surgeon: Fransisco Hertz, MD;  Location: [facility] OR;  Service: Vascular;  Laterality: Left;  . TONSILLECTOMY      OB History    No data available       Home Medications    Prior to Admission medications   Medication Sig Start Date End Date  Taking? Authorizing Provider  acetaminophen (TYLENOL) 650 MG CR tablet Take 650 mg by mouth every 6 (six) hours.   Yes Historical Provider, MD  amLODipine (NORVASC) 10 MG tablet Take 10 mg by mouth at bedtime.    Yes Historical Provider, MD  aspirin EC 81 MG tablet Take 81 mg by mouth at bedtime.    Yes Historical Provider, MD  calcium acetate (PHOSLO) 667 MG capsule Take 667-1,334 mg by mouth See admin instructions. Take 2 capsules (1334 mg) by mouth 3 times daily with meals and 1 capsule (667 mg) with snacks   Yes Historical Provider, MD  cinacalcet (SENSIPAR) 60 MG tablet Take 60 mg by mouth 2 (two) times a week. Wednesdays and Saturdays   Yes Historical Provider, MD  cyclobenzaprine (FLEXERIL) 5 MG tablet Take 1 tablet (5 mg total) by mouth 2 (two) times daily as needed for muscle spasms. Patient taking differently: Take 5 mg by mouth 2 (two) times daily.  08/26/16  Yes Shawn C Joy, PA-C  fenofibrate (TRICOR) 145 MG tablet Take 145 mg by mouth daily at 12 noon.    Yes Historical Provider, MD  fish oil-omega-3 fatty acids 1000 MG capsule Take 1 g by mouth at bedtime.    Yes Historical Provider, MD  HYDROcodone-acetaminophen (NORCO/VICODIN) 5-325 MG tablet Take 1 tablet by mouth every 6 (six) hours as needed for pain. 09/19/16  Yes Historical Provider, MD  insulin aspart (NOVOLOG) 100 UNIT/ML injection Inject 0-9 Units into the skin 3 (three) times daily with meals. Patient taking differently: Inject 2-5 Units into the skin 3 (three) times daily as needed for high blood sugar (CBG >150). Per sliding scale 10/18/15  Yes Jeralyn Bennett, MD  isosorbide mononitrate (IMDUR) 30 MG 24 hr tablet Take 15 mg by mouth daily at 12 noon.   Yes Historical Provider, MD  labetalol (NORMODYNE) 300 MG tablet Take 300 mg by mouth See admin instructions. Take 1 tablet (300 mg) by mouth twice daily - at noon and at bedtime 10/01/16  Yes Historical Provider, MD  losartan (COZAAR) 100 MG tablet Take 100 mg by mouth at  bedtime.    Yes Historical Provider, MD  multivitamin (RENA-VIT) TABS tablet Take 1 tablet by mouth at bedtime.    Yes Historical Provider, MD  Naphazoline HCl (CLEAR EYES OP) Place 1 drop into both eyes as needed (for dry eyes).   Yes Historical Provider, MD  oxyCODONE-acetaminophen (PERCOCET/ROXICET) 5-325 MG tablet Take 1-2 tablets by mouth every 6 (six) hours as needed for severe pain. 08/26/16  Yes Shawn C Joy, PA-C  OXYGEN Inhale into the lungs as needed (shortness of breath).   Yes Historical Provider, MD  RaNITidine HCl (ZANTAC PO) Take  1 tablet by mouth daily at 12 noon.   Yes Historical Provider, MD  Travoprost, BAK Free, (TRAVATAN) 0.004 % SOLN ophthalmic solution Place 1 drop into both eyes at bedtime.   Yes Historical Provider, MD  diazepam (VALIUM) 5 MG tablet Take 0.5 tablets (2.5 mg total) by mouth every 6 (six) hours as needed for muscle spasms. Patient not taking: Reported on 09/25/2016 08/28/16   Raeford Razor, MD  gabapentin (NEURONTIN) 100 MG capsule Take 1 capsule (100 mg total) by mouth 3 (three) times daily. Patient not taking: Reported on 12/29/2016 08/30/16   Audry Pili, PA-C  labetalol (NORMODYNE) 200 MG tablet Take 1 tablet (200 mg total) by mouth 2 (two) times daily. Patient not taking: Reported on 12/29/2016 07/11/16   Thomasene Lot, MD    Family History Family History  Problem Relation Age of Onset  . Diabetes Mother   . Heart disease Mother   . COPD Father   . Hypertension Father   . Lung disease Father   . Diabetes Father   . Diabetes Sister   . Breast cancer Sister     Social History Social History  Substance Use Topics  . Smoking status: Former Smoker    Types: Cigarettes    Quit date: 08/04/1999  . Smokeless tobacco: Never Used  . Alcohol use No     Allergies   Iodinated diagnostic agents and Lactose intolerance (gi)   Review of Systems Review of Systems  All other systems reviewed and are negative.    Physical Exam Updated Vital Signs BP  183/84   Pulse 94   Temp 98.1 F (36.7 C) (Oral)   Resp 14   SpO2 100%   Physical Exam  Constitutional: She appears well-developed and well-nourished. No distress.  HENT:  Head: Normocephalic and atraumatic.  Mouth/Throat: Oropharynx is clear and moist. No oropharyngeal exudate.  Eyes: Conjunctivae and EOM are normal. Pupils are equal, round, and reactive to light. Right eye exhibits no discharge. Left eye exhibits no discharge. No scleral icterus.  Neck: Normal range of motion. Neck supple. No JVD present. No thyromegaly present.  Cardiovascular: Normal rate, regular rhythm, normal heart sounds and intact distal pulses.  Exam reveals no gallop and no friction rub.   No murmur heard. Catheter R upper chest wall. No TTP  Pulmonary/Chest: Effort normal and breath sounds normal. No respiratory distress. She has no wheezes. She has no rales.  Abdominal: Soft. Bowel sounds are normal. She exhibits no distension and no mass. There is no tenderness.  Musculoskeletal: Normal range of motion. She exhibits no edema or tenderness.  Lymphadenopathy:    She has no cervical adenopathy.  Neurological: She is alert. Coordination normal.  Skin: Skin is warm and dry. No rash noted. No erythema.  Psychiatric: She has a normal mood and affect. Her behavior is normal.  Nursing note and vitals reviewed.    ED Treatments / Results  Labs (all labs ordered are listed, but only abnormal results are displayed) Labs Reviewed  BASIC METABOLIC PANEL - Abnormal; Notable for the following:       Result Value   Sodium 132 (*)    Chloride 90 (*)    CO2 21 (*)    Glucose, Bld 110 (*)    BUN 35 (*)    Creatinine, Ser 6.40 (*)    GFR calc non Af Amer 6 (*)    GFR calc Af Amer 7 (*)    Anion gap 21 (*)    All other  components within normal limits  CBC - Abnormal; Notable for the following:    RBC 3.81 (*)    Hemoglobin 10.9 (*)    HCT 35.3 (*)    RDW 19.8 (*)    All other components within normal limits    I-STAT TROPOININ, ED - Abnormal; Notable for the following:    Troponin i, poc 0.10 (*)    All other components within normal limits  I-STAT TROPOININ, ED    EKG  EKG Interpretation  Date/Time:  Sunday December 29 2016 15:53:50 EST Ventricular Rate:  89 PR Interval:  204 QRS Duration: 78 QT Interval:  376 QTC Calculation: 457 R Axis:   -62 Text Interpretation:  Normal sinus rhythm Left axis deviation Low voltage QRS Inferior infarct , age undetermined Cannot rule out Anterior infarct , age undetermined Abnormal ECG since last tracing no significant change Confirmed by Hyacinth Meeker  MD, Jaelah Hauth ([id number]) on 12/29/2016 5:38:28 PM       Radiology Dg Chest 2 View  Result Date: 12/29/2016 CLINICAL DATA:  Mid chest pain. EXAM: CHEST  2 VIEW COMPARISON:  December 24, 2016 FINDINGS: Mild atelectasis in the left lung base. Stable dialysis catheter. Stable cardiomegaly. No other interval change or acute abnormality. IMPRESSION: No active cardiopulmonary disease. Electronically Signed   By: Gerome Sam III M.D   On: 12/29/2016 16:36    Procedures Procedures (including critical care time)  Medications Ordered in ED Medications  ondansetron (ZOFRAN-ODT) disintegrating tablet 4 mg (4 mg Oral Given 12/29/16 1822)  traMADol (ULTRAM) tablet 50 mg (50 mg Oral Given 12/29/16 1823)  aspirin chewable tablet 324 mg (324 mg Oral Given 12/29/16 1823)     Initial Impression / Assessment and Plan / ED Course  I have reviewed the triage vital signs and the nursing notes.  Pertinent labs & imaging results that were available during my care of the patient were reviewed by me and considered in my medical decision making (see chart for details).    EKG is persistently abnormal overtime with multiple Q waves and nonspecific T waves but there is no change from prior, troponin is elevated but this could be consistent with renal disease and 48 hours since dialysis. The patient is at risk for cardiac disease given her  underlying medical problems and a heart catheterization showing some mild to moderate obstructive disease. We'll give nausea, some aspirin, some pain medication, anticipate admission to the hospital. For rule out  D/w Dr. Ophelia Charter who will admit   Final Clinical Impressions(s) / ED Diagnoses   Final diagnoses:  Chest pain, unspecified type    New Prescriptions New Prescriptions   No medications on file     Eber Hong, MD 12/29/16 1914

## 2016-12-29 NOTE — ED Triage Notes (Signed)
Patient here with chest pain and shortness of breath since am. States that she has vomited with same. tachypnea on arrival following exertion. Alert and oriented, due for dialysis tomorrow

## 2016-12-30 DIAGNOSIS — R748 Abnormal levels of other serum enzymes: Secondary | ICD-10-CM | POA: Diagnosis not present

## 2016-12-30 DIAGNOSIS — R079 Chest pain, unspecified: Secondary | ICD-10-CM | POA: Diagnosis not present

## 2016-12-30 DIAGNOSIS — R0789 Other chest pain: Secondary | ICD-10-CM | POA: Diagnosis not present

## 2016-12-30 DIAGNOSIS — N186 End stage renal disease: Secondary | ICD-10-CM

## 2016-12-30 LAB — LIPID PANEL
CHOL/HDL RATIO: 4 ratio
Cholesterol: 95 mg/dL (ref 0–200)
HDL: 24 mg/dL — AB (ref >40)
LDL CALC: 27 mg/dL (ref 0–99)
Triglycerides: 218 mg/dL — ABNORMAL HIGH (ref <150)
VLDL: 44 mg/dL — ABNORMAL HIGH (ref 0–40)

## 2016-12-30 LAB — HEPATITIS B SURFACE ANTIGEN: Hepatitis B Surface Ag: NEGATIVE

## 2016-12-30 LAB — GLUCOSE, CAPILLARY
GLUCOSE-CAPILLARY: 90 mg/dL (ref 65–99)
Glucose-Capillary: 123 mg/dL — ABNORMAL HIGH (ref 65–99)
Glucose-Capillary: 108 mg/dL — ABNORMAL HIGH (ref 65–99)

## 2016-12-30 LAB — TROPONIN I
Troponin I: 0.18 ng/mL (ref <0.03)
Troponin I: 0.2 ng/mL (ref <0.03)

## 2016-12-30 LAB — TSH: TSH: 1.267 u[IU]/mL (ref 0.350–4.500)

## 2016-12-30 MED ORDER — HYDRALAZINE HCL 20 MG/ML IJ SOLN: 10.0000 mg | INTRAMUSCULAR | Status: DC | PRN

## 2016-12-30 MED ORDER — GABAPENTIN 100 MG PO CAPS: 100.0000 mg | ORAL_CAPSULE | Freq: Three times a day (TID) | ORAL | 0 refills | Status: AC

## 2016-12-30 MED ORDER — CALCIUM CARBONATE ANTACID 500 MG PO CHEW
CHEWABLE_TABLET | ORAL | Status: AC
  Administered 2016-12-30: 600 mg
  Filled 2016-12-30: qty 3

## 2016-12-30 MED ORDER — CALCITRIOL 0.25 MCG PO CAPS
0.2500 ug | ORAL_CAPSULE | ORAL | Status: DC
  Administered 2016-12-30: 0.25 ug via ORAL

## 2016-12-30 MED ORDER — OXYCODONE-ACETAMINOPHEN 5-325 MG PO TABS
ORAL_TABLET | ORAL | Status: AC
  Filled 2016-12-30: qty 2

## 2016-12-30 MED ORDER — CALCITRIOL 0.25 MCG PO CAPS
ORAL_CAPSULE | ORAL | Status: AC
  Administered 2016-12-30: 0.25 ug via ORAL
  Filled 2016-12-30: qty 1

## 2016-12-30 MED ORDER — CLONAZEPAM 0.5 MG PO TABS
ORAL_TABLET | ORAL | Status: AC
  Filled 2016-12-30: qty 1

## 2016-12-30 NOTE — Progress Notes (Signed)
Responded to consult to create advanced directive. Explained/discussed form w/ pt, who will consider it. Advised her of process for completion and that she can ask nurse to page for a chaplain when she is ready to execute it. Provided spiritual/emotional support and prayer (which was much appreciated). Chaplain available for f/u.   12/30/16 1400  Clinical Encounter Type  Visited With Patient  Visit Type Initial;Psychological support;Spiritual support;Social support  Referral From Nurse  Spiritual Encounters  Spiritual Needs Literature;Prayer;Emotional  Stress Factors  Patient Stress Factors Health changes;Loss of control   Colleen Mullins, [address]

## 2016-12-30 NOTE — Plan of Care (Signed)
Problem: Health Behavior/Discharge Planning: Goal: Ability to manage health-related needs will improve Outcome: Adequate for Discharge Patient's needs addressed and patient still needs to get her advanced directive but she's working on it with her sister. Social Services and Case Management was consulted and needs addressed.

## 2016-12-30 NOTE — Consult Note (Signed)
Reason for Consult: chest pain   Referring Physician: Dr. Eliseo Squires   PCP:  Burman Freestone, MD  Primary Cardiologist: Dr. Ashok Norris  Colleen Mullins is an 70 y.o. female.    Chief Complaint: admitted 12/29/16 with chest pain, SOB and vomiting. She is ESRD   HPI:  15 yof with hx of  of CAD (Nonobstructive by cath 06/2016 with 60% D1 and 50% PL off RCA and 25% mid LAD), diastolic dysfunction, DM, retinopathy, macular degeneration, ESRD on HD, GERD, labile HTN, anemia, hyperlipidemia, PAD.  With admit in August she did have low level of elevated troponin.  Echo at that time with EF 65-70%  With G2DD.  Nuc study was done with + ischemia, which led to above cath.   On OV follow up she continued with chest pain and concern for elevated BP, GERD or microvascular angina and Imdur was added.  In Oct. She had no further chest pain.  Her BP was controlled with some decrease with dialysis.  Her PAD had been stable as well.  To ambulate she uses walker and power chair.  She has a hx of falls.   Now admitted with chest pain, SOB and vomiting.  No symptoms until yesterday and she began vomiting then she developed chest pain she thought it was due to vomiting.  But she came to make sure.  Currently no pain or nausea, she had no diarrhea and no fevers.  She does have other relatives int he house with viral syndrome.     EKG SR with LAD and Q wave in V2-V3 but no changes from Nov 2017. Her troponin poc 0.10; 0.11 and troponin 0.19; 0.18  Cr. 6.70 ; TSH 1.267  LDL 27, TG 218 T chol 95  H/H 10.8/35 neg WBC. CXR stable no acute process   Past Medical History:  Diagnosis Date  . Anemia    low iron  . Arthritis   . CAD in native artery 08/12/2016   a. Nonobstructive ASCAD by cath with 60% D1 and 50% PL off RCA and 25% mid LAD.  . Diabetes mellitus    type 2  . Diabetic retinopathy ([place])   . Dialysis patient [facility])    M-W-F @ [facility]  . Diastolic dysfunction   . Ejection fraction   . ESRD on  hemodialysis [facility])    Dialysis T/Th/Sa  . GERD (gastroesophageal reflux disease)   . Glaucoma   . H/O: Bell's palsy 2007  . Hyperlipidemia   . Hypertension    a. labile - hx of BP dropping at HD.  . Macular degeneration   . PAD (peripheral artery disease) ([place])    a. ABI 08/2016: mod RLE disease, normal L ABI.    Past Surgical History:  Procedure Laterality Date  . ABDOMINAL HYSTERECTOMY    . ARTERIOVENOUS GRAFT PLACEMENT     Left forearm x 2, one removed in March  . ARTERIOVENOUS GRAFT PLACEMENT     Left forearm  . [place] REMOVAL  10/08/2011   Procedure: REMOVAL OF ARTERIOVENOUS GORETEX GRAFT ([place]);  Surgeon: Elam Dutch, MD;  Location: [place];  Service: Vascular;  Laterality: Left;  . BTL    . CARDIAC CATHETERIZATION N/A 07/10/2016   Procedure: Left Heart Cath and Coronary Angiography;  Surgeon: Nelva Bush, MD;  Location: [place] CV LAB;  Service: Cardiovascular;  Laterality: N/A;  . COLONOSCOPY    . EXCHANGE OF A DIALYSIS CATHETER Right 03/27/2016   Procedure:  EXCHANGE OF A DIALYSIS CATHETER;  Surgeon: Conrad Escalon, MD;  Location: [place];  Service: Vascular;  Laterality: Right;  . EYE SURGERY Left    Lasik surgery on left. Cataract surgery on both eyes  . LAPAROTOMY  ?2000   Pelvic mass (Benign)  . REVISION OF ARTERIOVENOUS GORETEX GRAFT Left 03/27/2016   Procedure: REVISION OF LEFT ARTERIOVENOUS GORETEX GRAFT;  Surgeon: Conrad [place], MD;  Location: [place];  Service: Vascular;  Laterality: Left;  . TONSILLECTOMY      Family History  Problem Relation Age of Onset  . Diabetes Mother   . Heart disease Mother   . COPD Father   . Hypertension Father   . Lung disease Father   . Diabetes Father   . Diabetes Sister   . Breast cancer Sister    Social History:  reports that she quit smoking about 17 years ago. Her smoking use included Cigarettes. She has never used smokeless tobacco. She reports that she does not drink alcohol or use drugs.  Allergies:  Allergies    Allergen Reactions  . Iodinated Diagnostic Agents Hives and Itching  . Lactose Intolerance (Gi) Other (See Comments)    Abdominal pains and bloating    OUTPATIENT MEDICATIONS: No current facility-administered medications on file prior to encounter.    Current Outpatient Prescriptions on File Prior to Encounter  Medication Sig Dispense Refill  . amLODipine (NORVASC) 10 MG tablet Take 10 mg by mouth at bedtime.     Marland Kitchen aspirin EC 81 MG tablet Take 81 mg by mouth at bedtime.     . calcium acetate (PHOSLO) 667 MG capsule Take 667-1,334 mg by mouth See admin instructions. Take 2 capsules (1334 mg) by mouth 3 times daily with meals and 1 capsule (667 mg) with snacks    . cinacalcet (SENSIPAR) 60 MG tablet Take 60 mg by mouth 2 (two) times a week. Wednesdays and Saturdays    . cyclobenzaprine (FLEXERIL) 5 MG tablet Take 1 tablet (5 mg total) by mouth 2 (two) times daily as needed for muscle spasms. (Patient taking differently: Take 5 mg by mouth 2 (two) times daily. ) 20 tablet 0  . fenofibrate (TRICOR) 145 MG tablet Take 145 mg by mouth daily at 12 noon.     . fish oil-omega-3 fatty acids 1000 MG capsule Take 1 g by mouth at bedtime.     Marland Kitchen HYDROcodone-acetaminophen (NORCO/VICODIN) 5-325 MG tablet Take 1 tablet by mouth every 6 (six) hours as needed for pain.    Marland Kitchen insulin aspart (NOVOLOG) 100 UNIT/ML injection Inject 0-9 Units into the skin 3 (three) times daily with meals. (Patient taking differently: Inject 2-5 Units into the skin 3 (three) times daily as needed for high blood sugar (CBG >150). Per sliding scale) 10 mL 11  . losartan (COZAAR) 100 MG tablet Take 100 mg by mouth at bedtime.     . multivitamin (RENA-VIT) TABS tablet Take 1 tablet by mouth at bedtime.     . Naphazoline HCl (CLEAR EYES OP) Place 1 drop into both eyes as needed (for dry eyes).    Marland Kitchen oxyCODONE-acetaminophen (PERCOCET/ROXICET) 5-325 MG tablet Take 1-2 tablets by mouth every 6 (six) hours as needed for severe pain. 15 tablet  0  . Travoprost, BAK Free, (TRAVATAN) 0.004 % SOLN ophthalmic solution Place 1 drop into both eyes at bedtime.    . gabapentin (NEURONTIN) 100 MG capsule Take 1 capsule (100 mg total) by mouth 3 (three) times daily. (Patient not taking:  Reported on 12/29/2016) 60 capsule 0   CURRENT MEDICATIONS: Scheduled Meds: . amLODipine  10 mg Oral QHS  . aspirin EC  81 mg Oral QHS  . calcium acetate  1,334 mg Oral TID WC  . [START ON 01/01/2017] cinacalcet  60 mg Oral Once per day on Wed Sat  . cyclobenzaprine  5 mg Oral BID  . enoxaparin (LOVENOX) injection  30 mg Subcutaneous Q24H  . ferric gluconate (FERRLECIT/NULECIT) IV  125 mg Intravenous Q M,W,F-HD  . gabapentin  100 mg Oral TID  . insulin aspart  0-9 Units Subcutaneous TID WC  . isosorbide mononitrate  15 mg Oral Q1200  . labetalol  300 mg Oral 2 times per day  . latanoprost  1 drop Both Eyes QHS  . losartan  100 mg Oral QHS  . multivitamin  1 tablet Oral QHS  . omega-3 acid ethyl esters  1 g Oral QHS   Continuous Infusions: PRN Meds:.sodium chloride, sodium chloride, acetaminophen **OR** acetaminophen, alteplase, calcium acetate, calcium carbonate (dosed in mg elemental calcium), camphor-menthol **AND** hydrOXYzine, docusate sodium, feeding supplement (NEPRO CARB STEADY), gi cocktail, heparin, heparin, hydrALAZINE, HYDROcodone-acetaminophen, lidocaine (PF), lidocaine-prilocaine, morphine injection, ondansetron **OR** ondansetron (ZOFRAN) IV, oxyCODONE-acetaminophen, pentafluoroprop-tetrafluoroeth, sorbitol, zolpidem   Results for orders placed or performed during the hospital encounter of 12/29/16 (from the past 48 hour(s))  Basic metabolic panel     Status: Abnormal   Collection Time: 12/29/16  3:58 PM  Result Value Ref Range   Sodium 132 (L) 135 - 145 mmol/L   Potassium 5.1 3.5 - 5.1 mmol/L   Chloride 90 (L) 101 - 111 mmol/L   CO2 21 (L) 22 - 32 mmol/L   Glucose, Bld 110 (H) 65 - 99 mg/dL   BUN 35 (H) 6 - 20 mg/dL   Creatinine, Ser  6.40 (H) 0.44 - 1.00 mg/dL   Calcium 9.1 8.9 - 10.3 mg/dL   GFR calc non Af Amer 6 (L) >60 mL/min   GFR calc Af Amer 7 (L) >60 mL/min    Comment: (NOTE) The eGFR has been calculated using the CKD EPI equation. This calculation has not been validated in all clinical situations. eGFR's persistently <60 mL/min signify possible Chronic Kidney Disease.    Anion gap 21 (H) 5 - 15  CBC     Status: Abnormal   Collection Time: 12/29/16  3:58 PM  Result Value Ref Range   WBC 6.3 4.0 - 10.5 K/uL   RBC 3.81 (L) 3.87 - 5.11 MIL/uL   Hemoglobin 10.9 (L) 12.0 - 15.0 g/dL   HCT 35.3 (L) 36.0 - 46.0 %   MCV 92.7 78.0 - 100.0 fL   MCH 28.6 26.0 - 34.0 pg   MCHC 30.9 30.0 - 36.0 g/dL   RDW 19.8 (H) 11.5 - 15.5 %   Platelets 362 150 - 400 K/uL  I-stat troponin, ED     Status: Abnormal   Collection Time: 12/29/16  4:17 PM  Result Value Ref Range   Troponin i, poc 0.10 (HH) 0.00 - 0.08 ng/mL   Comment NOTIFIED PHYSICIAN    Comment 3            Comment: Due to the release kinetics of cTnI, a negative result within the first hours of the onset of symptoms does not rule out myocardial infarction with certainty. If myocardial infarction is still suspected, repeat the test at appropriate intervals.   I-stat troponin, ED     Status: Abnormal   Collection Time: 12/29/16  6:50  PM  Result Value Ref Range   Troponin i, poc 0.11 (HH) 0.00 - 0.08 ng/mL   Comment NOTIFIED PHYSICIAN    Comment 3            Comment: Due to the release kinetics of cTnI, a negative result within the first hours of the onset of symptoms does not rule out myocardial infarction with certainty. If myocardial infarction is still suspected, repeat the test at appropriate intervals.   Glucose, capillary     Status: None   Collection Time: 12/29/16  9:23 PM  Result Value Ref Range   Glucose-Capillary 89 65 - 99 mg/dL  Renal function panel     Status: Abnormal   Collection Time: 12/29/16  9:32 PM  Result Value Ref Range    Sodium 132 (L) 135 - 145 mmol/L   Potassium 5.0 3.5 - 5.1 mmol/L   Chloride 90 (L) 101 - 111 mmol/L   CO2 22 22 - 32 mmol/L   Glucose, Bld 82 65 - 99 mg/dL   BUN 38 (H) 6 - 20 mg/dL   Creatinine, Ser 6.70 (H) 0.44 - 1.00 mg/dL   Calcium 9.0 8.9 - 10.3 mg/dL   Phosphorus 3.4 2.5 - 4.6 mg/dL   Albumin 3.7 3.5 - 5.0 g/dL   GFR calc non Af Amer 6 (L) >60 mL/min   GFR calc Af Amer 6 (L) >60 mL/min    Comment: (NOTE) The eGFR has been calculated using the CKD EPI equation. This calculation has not been validated in all clinical situations. eGFR's persistently <60 mL/min signify possible Chronic Kidney Disease.    Anion gap 20 (H) 5 - 15  CBC     Status: Abnormal   Collection Time: 12/29/16  9:32 PM  Result Value Ref Range   WBC 6.4 4.0 - 10.5 K/uL   RBC 3.81 (L) 3.87 - 5.11 MIL/uL   Hemoglobin 10.8 (L) 12.0 - 15.0 g/dL   HCT 35.0 (L) 36.0 - 46.0 %   MCV 91.9 78.0 - 100.0 fL   MCH 28.3 26.0 - 34.0 pg   MCHC 30.9 30.0 - 36.0 g/dL   RDW 20.1 (H) 11.5 - 15.5 %   Platelets 377 150 - 400 K/uL  Troponin I-serum (0, 3, 6 hours)     Status: Abnormal   Collection Time: 12/29/16  9:32 PM  Result Value Ref Range   Troponin I 0.19 (HH) <0.03 ng/mL    Comment: CRITICAL RESULT CALLED TO, READ BACK BY AND VERIFIED WITH: ROGERS D,RN 12/29/16 2229 WAYK   Troponin I-serum (0, 3, 6 hours)     Status: Abnormal   Collection Time: 12/30/16  3:42 AM  Result Value Ref Range   Troponin I 0.18 (HH) <0.03 ng/mL    Comment: CRITICAL VALUE NOTED.  VALUE IS CONSISTENT WITH PREVIOUSLY REPORTED AND CALLED VALUE.  TSH     Status: None   Collection Time: 12/30/16  3:42 AM  Result Value Ref Range   TSH 1.267 0.350 - 4.500 uIU/mL    Comment: Performed by a 3rd Generation assay with a functional sensitivity of <=0.01 uIU/mL.  Lipid panel     Status: Abnormal   Collection Time: 12/30/16  3:42 AM  Result Value Ref Range   Cholesterol 95 0 - 200 mg/dL   Triglycerides 218 (H) <150 mg/dL   HDL 24 (L) >40 mg/dL     Total CHOL/HDL Ratio 4.0 RATIO   VLDL 44 (H) 0 - 40 mg/dL   LDL Cholesterol 27 0 -  99 mg/dL    Comment:        Total Cholesterol/HDL:CHD Risk Coronary Heart Disease Risk Table                     Men   Women  1/2 Average Risk   3.4   3.3  Average Risk       5.0   4.4  2 X Average Risk   9.6   7.1  3 X Average Risk  23.4   11.0        Use the calculated Patient Ratio above and the CHD Risk Table to determine the patient's CHD Risk.        ATP III CLASSIFICATION (LDL):  <100     mg/dL   Optimal  100-129  mg/dL   Near or Above                    Optimal  130-159  mg/dL   Borderline  160-189  mg/dL   High  >190     mg/dL   Very High    Dg Chest 2 View  Result Date: 12/29/2016 CLINICAL DATA:  Mid chest pain. EXAM: CHEST  2 VIEW COMPARISON:  December 24, 2016 FINDINGS: Mild atelectasis in the left lung base. Stable dialysis catheter. Stable cardiomegaly. No other interval change or acute abnormality. IMPRESSION: No active cardiopulmonary disease. Electronically Signed   By: Dorise Bullion III M.D   On: 12/29/2016 16:36    ROS: General:no colds or fevers, no weight changes Skin:no rashes or ulcers HEENT:no blurred vision, no congestion CV:see HPI PUL:see HPI GI:no diarrhea constipation or melena, no indigestion, + vomiting. GU:no hematuria, no dysuria MS:no joint pain, no claudication Neuro:no syncope, no lightheadedness Endo:+ diabetes, no thyroid disease    Blood pressure (!) 152/81, pulse 92, temperature 98.9 F (37.2 C), temperature source Oral, resp. rate 20, height 5' 6" (1.676 m), weight 164 lb 14.5 oz (74.8 kg), SpO2 97 %.  Wt Readings from Last 3 Encounters:  12/30/16 164 lb 14.5 oz (74.8 kg)  10/15/16 160 lb (72.6 kg)  09/25/16 168 lb (76.2 kg)    JQ:BHALPFX:TKWIOXBD affect, NAD Skin:Warm and dry, brisk capillary refill HEENT:normocephalic, sclera clear, mucus membranes moist Neck:supple, no JVD, no bruits  Heart:S1S2 RRR without murmur, gallup, rub or  click Lungs:clear without rales, rhonchi, or wheezes ZHG:DJME, non tender, + BS, do not palpate liver spleen or masses Ext:no lower ext edema, 2+ pedal pulses, 2+ radial pulses Neuro:alert and oriented X 3, MAE, follows commands, + facial symmetry    Assessment/Plan Principal Problem:   Chest pain Active Problems:   Type 2 diabetes mellitus with renal complication (HCC)   Elevated troponin   ESRD on dialysis (HCC)   Benign essential HTN   Anemia in chronic renal disease  1. Chest pain with elevated troponin, with hx of elevated troponin in ESRD pt on HD. with non obstructive CAD on cath 06/2016.  No acute EKG changes. No further chest pain and the vomiting came first.  Would just follow this may have been chest pain due to vomiting due to virus.  Allow BK.  Dr. Acie Fredrickson to see for further eval.  2. CAD non obstructive disease 06/2016  3. ESRD on HD renal will see  4. HTN continues elevated 180/82 to 152/81   5. Chronic anemia with stable Hgb at 10.8 per IM  6.  DM-2 followed by IM  Cecilie Kicks  Nurse Practitioner Certified [facility] HEARTCARE Pager [phone] or after 5pm or weekends call [phone] 12/30/2016, 7:02 AM   Attending Note:   The patient was seen and examined.  Agree with assessment and plan as noted above.  Changes made to the above note as needed.  Patient seen and independently examined with Cecilie Kicks, NP.   We discussed all aspects of the encounter. I agree with the assessment and plan as stated above.  1. Atypical  CP - associated with an episode of nausea and vomiting. The chest pain has resolved. Her troponins are very minimally elevated but I noted that they have been elevated chronically in the past. She had a cardiac catheterization several months ago and was noted to have only minor coronary artery irregularities.  At this time I do not think that she needs any additional workup. She may eat breakfast. We will sign off. Further  evaluation and management per Dr. Eliseo Squires     I have spent a total of 40 minutes with patient reviewing hospital  notes , telemetry, EKGs, labs and examining patient as well as establishing an assessment and plan that was discussed with the patient. > 50% of time was spent in direct patient care.    Thayer Headings, Brooke Bonito., MD, [facility] 12/30/2016, 8:52 AM [address],  [place] Pager [phone]

## 2016-12-30 NOTE — Plan of Care (Signed)
Problem: Pain Managment: Goal: General experience of comfort will improve Outcome: Completed/Met Date Met: 12/30/16 Patient is on her appropriate meds for her pain and she is controlled with them.

## 2016-12-30 NOTE — Care Management Note (Signed)
Case Management Note  Patient Details  Name: Colleen PlowmanDianne Mullins MRN: [id number] Date of Birth: 06/17/1946  Subjective/Objective:  Pt presented for chest pain. HD MWF. Plan will be for HD then d/c post. Pt is from home with daughter. Pt voiced that she will need transportation home post HD tonight. CM did reach out to the CSW in regards to transportation.                    Action/Plan: CM did speak with pt in regards to disposition needs. Referral received for needing a Rx for stronger glasses and hearing aide assistance. CM did speak with pt and she has a Rx, however had issues with [facility]- [facility] CM did make pt aware of address. Pt also had concerns with her hearing Aides- she needs new ones and cannot afford. Pt has [employer]. CM did make pt aware that she can reach out to her insurance provider and speak to a [employer] Case Manager in regards to needs. [facility]. The office helped patient in the past with hearing aides. [facility] either had the orange card vs Medicaid and her hearing aides were paid for. [facility] will not be able to assist-pt is aware. Pt to call [employer] in regards to assistance with Hearing Aides. No further needs from CM at this time.   Expected Discharge Date:                  Expected Discharge Plan:  Home/Self Care  In-House Referral:  Clinical Social Work Education officer, environmental(Transportation Assistance)  Discharge planning Services  CM Consult  Post Acute Care Choice:  NA Choice offered to:  NA  DME Arranged:  N/A DME Agency:  NA  HH Arranged:  NA HH Agency:  NA  Status of Service:  Completed, signed off  If discussed at [employer] Length of Stay Meetings, dates discussed:    Additional Comments:  Gala LewandowskyGraves-Bigelow, Azka Steger Kaye, RN 12/30/2016, 2:38 PM

## 2016-12-30 NOTE — Progress Notes (Signed)
CSW sent taxi voucher to HD for pt earlier this pm. Informed 3W NS of such.

## 2016-12-30 NOTE — Plan of Care (Signed)
Problem: Physical Regulation: Goal: Ability to maintain clinical measurements within normal limits will improve Outcome: Adequate for Discharge Patient is at her baseline at this point

## 2016-12-30 NOTE — Plan of Care (Signed)
Problem: Education: Goal: Knowledge of [place] [facility] Education information/materials will improve Outcome: Completed/Met Date Met: 12/30/16 Reviewed all her meds and instructions with her D/C instructions and patient verbalized understanding   

## 2016-12-30 NOTE — Plan of Care (Signed)
Problem: Safety: Goal: Ability to remain free from injury will improve Outcome: Completed/Met Date Met: 12/30/16 Reviewed on admission safety, call light phone not to get OOB without assistance and patient verbalized understanding

## 2016-12-30 NOTE — Discharge Summary (Signed)
Physician Discharge Summary  Lora Chavers ZOX:[id number] DOB: 03/09/1946 DOA: 12/29/2016  PCP: Zoila Shutter, MD  Admit date: 12/29/2016 Discharge date: 12/30/2016   Recommendations for Outpatient Follow-Up:   Continue HD  Discharge Diagnosis:   Principal Problem:   Chest pain Active Problems:   Type 2 diabetes mellitus with renal complication (HCC)   Elevated troponin   ESRD on dialysis (HCC)   Benign essential HTN   Anemia in chronic renal disease   Discharge disposition:  Home.  Discharge Condition: Improved.  Diet recommendation: Low sodium, heart healthy/carb mod/renal diet  Wound care: None.   History of Present Illness:   Colleen Mullins is a 71 y.o. female with medical history significant of ESRD on MWF HD; PAD; HTN; HLD; diastolic dysfunction; CAD; and DM presenting with chest pain.  Patient awoke this AM with nausea.  Glucose 108.  Unable to eat before she got very nauseated.  (Had 2 beef hot dogs last night).  Had a BM.  Vomited and developed chest pain.  +SOB, went and put O2 on.  More n/v - dry heaves.  Chest pain worsened.  Substernal.  Chest pain is now gone thanks to pain medication (Ultram).  Arthritis pain is ongoing.    The patient was hospitalized with atypical chest pain in 8/17.  Her troponin trended up to 0.23.  Echo showed preserved EF with grade 2 diastolic dysfunction.  Nuclear stress test showed a large region of reversible ischemia within the anterior and anterolateral wall with global hypokinesis and so a heart catheterization was performed which showed mild to moderate non-obstructive CAD   Hospital Course by Problem:   Chest pain -seen by cards: no further interventions as recent normal cath  ESRD -HD done in hospital     Medical Consultants:    Cards  renal   Discharge Exam:   Vitals:   12/29/16 2223 12/30/16 0540  BP: (!) 180/82 (!) 152/81  Pulse: 90 92  Resp:    Temp:  98.9 F (37.2 C)   Vitals:   12/29/16 2038  12/29/16 2211 12/29/16 2223 12/30/16 0540  BP: (!) 180/82 (!) 180/82 (!) 180/82 (!) 152/81  Pulse: 94  90 92  Resp:      Temp: 98 F (36.7 C)   98.9 F (37.2 C)  TempSrc: Oral   Oral  SpO2: 100%   97%  Weight: 75.3 kg (166 lb 1.6 oz)   74.8 kg (164 lb 14.5 oz)  Height:    5\' 6"  (1.676 m)    Gen:  NAD    The results of significant diagnostics from this hospitalization (including imaging, microbiology, ancillary and laboratory) are listed below for reference.     Procedures and Diagnostic Studies:   Dg Chest 2 View  Result Date: 12/29/2016 CLINICAL DATA:  Mid chest pain. EXAM: CHEST  2 VIEW COMPARISON:  December 24, 2016 FINDINGS: Mild atelectasis in the left lung base. Stable dialysis catheter. Stable cardiomegaly. No other interval change or acute abnormality. IMPRESSION: No active cardiopulmonary disease. Electronically Signed   By: Gerome Sam III M.D   On: 12/29/2016 16:36     Labs:   Basic Metabolic Panel:  Recent Labs Lab 12/29/16 1558 12/29/16 2132  NA 132* 132*  K 5.1 5.0  CL 90* 90*  CO2 21* 22  GLUCOSE 110* 82  BUN 35* 38*  CREATININE 6.40* 6.70*  CALCIUM 9.1 9.0  PHOS  --  3.4   GFR Estimated Creatinine Clearance: 8 mL/min (by C-G formula  based on SCr of 6.7 mg/dL (H)). Liver Function Tests:  Recent Labs Lab 12/29/16 2132  ALBUMIN 3.7   No results for input(s): LIPASE, AMYLASE in the last 168 hours. No results for input(s): AMMONIA in the last 168 hours. Coagulation profile No results for input(s): INR, PROTIME in the last 168 hours.  CBC:  Recent Labs Lab 12/29/16 1558 12/29/16 2132  WBC 6.3 6.4  HGB 10.9* 10.8*  HCT 35.3* 35.0*  MCV 92.7 91.9  PLT 362 377   Cardiac Enzymes:  Recent Labs Lab 12/29/16 2132 12/30/16 0342 12/30/16 0833  TROPONINI 0.19* 0.18* 0.20*   BNP: Invalid input(s): POCBNP CBG:  Recent Labs Lab 12/29/16 2123 12/30/16 0752 12/30/16 1127  GLUCAP 89 90 123*   D-Dimer No results for input(s):  DDIMER in the last 72 hours. Hgb A1c No results for input(s): HGBA1C in the last 72 hours. Lipid Profile  Recent Labs  12/30/16 0342  CHOL 95  HDL 24*  LDLCALC 27  TRIG 161*  CHOLHDL 4.0   Thyroid function studies  Recent Labs  12/30/16 0342  TSH 1.267   Anemia work up No results for input(s): VITAMINB12, FOLATE, FERRITIN, TIBC, IRON, RETICCTPCT in the last 72 hours. Microbiology No results found for this or any previous visit (from the past 240 hour(s)).   Discharge Instructions:   Discharge Instructions    Discharge instructions    Complete by:  As directed    Renal diet Will need to get narcotic prescription from PCP   Increase activity slowly    Complete by:  As directed      Allergies as of 12/30/2016      Reactions   Iodinated Diagnostic Agents Hives, Itching   Lactose Intolerance (gi) Other (See Comments)   Abdominal pains and bloating      Medication List    TAKE these medications   acetaminophen 650 MG CR tablet Commonly known as:  TYLENOL Take 650 mg by mouth every 6 (six) hours.   amLODipine 10 MG tablet Commonly known as:  NORVASC Take 10 mg by mouth at bedtime.   aspirin EC 81 MG tablet Take 81 mg by mouth at bedtime.   calcium acetate 667 MG capsule Commonly known as:  PHOSLO Take 667-1,334 mg by mouth See admin instructions. Take 2 capsules (1334 mg) by mouth 3 times daily with meals and 1 capsule (667 mg) with snacks   cinacalcet 60 MG tablet Commonly known as:  SENSIPAR Take 60 mg by mouth 2 (two) times a week. Wednesdays and Saturdays   CLEAR EYES OP Place 1 drop into both eyes as needed (for dry eyes).   cyclobenzaprine 5 MG tablet Commonly known as:  FLEXERIL Take 1 tablet (5 mg total) by mouth 2 (two) times daily as needed for muscle spasms. What changed:  when to take this   fenofibrate 145 MG tablet Commonly known as:  TRICOR Take 145 mg by mouth daily at 12 noon.   fish oil-omega-3 fatty acids 1000 MG capsule Take 1  g by mouth at bedtime.   gabapentin 100 MG capsule Commonly known as:  NEURONTIN Take 1 capsule (100 mg total) by mouth 3 (three) times daily.   HYDROcodone-acetaminophen 5-325 MG tablet Commonly known as:  NORCO/VICODIN Take 1 tablet by mouth every 6 (six) hours as needed for pain.   insulin aspart 100 UNIT/ML injection Commonly known as:  novoLOG Inject 0-9 Units into the skin 3 (three) times daily with meals. What changed:  how much  to take  when to take this  reasons to take this  additional instructions   isosorbide mononitrate 30 MG 24 hr tablet Commonly known as:  IMDUR Take 15 mg by mouth daily at 12 noon.   labetalol 300 MG tablet Commonly known as:  NORMODYNE Take 300 mg by mouth See admin instructions. Take 1 tablet (300 mg) by mouth twice daily - at noon and at bedtime   losartan 100 MG tablet Commonly known as:  COZAAR Take 100 mg by mouth at bedtime.   multivitamin Tabs tablet Take 1 tablet by mouth at bedtime.   oxyCODONE-acetaminophen 5-325 MG tablet Commonly known as:  PERCOCET/ROXICET Take 1-2 tablets by mouth every 6 (six) hours as needed for severe pain.   OXYGEN Inhale into the lungs as needed (shortness of breath).   Travoprost (BAK Free) 0.004 % Soln ophthalmic solution Commonly known as:  TRAVATAN Place 1 drop into both eyes at bedtime.   ZANTAC PO Take 1 tablet by mouth daily at 12 noon.      Follow-up Information    WOODYEAR,WYNNE E, MD Follow up in 1 week(s).   Specialty:  Internal Medicine Contact information: [address] [place] [id number] [phone]            Time coordinating discharge: 35 min  Signed:  Joseph ArtJESSICA U Emmalyne Giacomo   [facility] 12/30/2016, 2:54 PM

## 2016-12-30 NOTE — Consult Note (Signed)
[name] Renal Consultation Note    Indication for Consultation:  Management of ESRD/hemodialysis; anemia, hypertension/volume and secondary hyperparathyroidism PCP: Zoila Shutter, MD Referring Physician: Marlin Canary, MD  HPI: Colleen Mullins is a 71 y.o. female with ESRD on MWF at [facility] having recent transferred there due to moving.  She was getting ready for church on Sunday and became nauseous and had vomiting.  While vomiting she had chest pain that continued after vomiting. She had SOB yesterday, but none today.  Today, her greatest complaint is pain from arthritis: lower back, left ankle, knees, fingers and left shoulder.  Her lower back and left ankle hurt most of all.  Others at home have colds and viruses and she is trying to stay away from them.  No fever, chills, diarrhea.  Due to persistent CP after vomiting, she came to the ED for evaluation.    Eval in the ED showed stable CBC, CXR neg for volume or acute process, POC trop was 0.1.  EKG showed SR and no change from 09/2016. She regularly attends dialysis and stays full treatments . She gets +/- EDW and gains are variable.  Her last treatment was Friday with net UF 1.9 and post wt 72.3 (EDW 71.5). Past Medical History:  Diagnosis Date  . Anemia    low iron  . Arthritis   . CAD in native artery 08/12/2016   a. Nonobstructive ASCAD by cath with 60% D1 and 50% PL off RCA and 25% mid LAD.  . Diabetes mellitus    type 2  . Diabetic retinopathy (HCC)   . Dialysis patient  [facility])    M-W-F @ [facility]  . Diastolic dysfunction   . Ejection fraction   . ESRD on hemodialysis [facility])    Dialysis T/Th/Sa  . GERD (gastroesophageal reflux disease)   . Glaucoma   . H/O: Bell's palsy 2007  . Hyperlipidemia   . Hypertension    a. labile - hx of BP dropping at HD.  . Macular degeneration   . PAD (peripheral artery disease) (HCC)    a. ABI 08/2016: mod RLE disease, normal L ABI.   Past Surgical History:  Procedure  Laterality Date  . ABDOMINAL HYSTERECTOMY    . ARTERIOVENOUS GRAFT PLACEMENT     Left forearm x 2, one removed in March  . ARTERIOVENOUS GRAFT PLACEMENT     Left forearm  . AVGG REMOVAL  10/08/2011   Procedure: REMOVAL OF ARTERIOVENOUS GORETEX GRAFT (AVGG);  Surgeon: Sherren Kerns, MD;  Location: [facility] OR;  Service: Vascular;  Laterality: Left;  . BTL    . CARDIAC CATHETERIZATION N/A 07/10/2016   Procedure: Left Heart Cath and Coronary Angiography;  Surgeon: Yvonne Kendall, MD;  Location: [facility] INVASIVE CV LAB;  Service: Cardiovascular;  Laterality: N/A;  . COLONOSCOPY    . EXCHANGE OF A DIALYSIS CATHETER Right 03/27/2016   Procedure: EXCHANGE OF A DIALYSIS CATHETER;  Surgeon: Fransisco Hertz, MD;  Location: [facility] OR;  Service: Vascular;  Laterality: Right;  . EYE SURGERY Left    Lasik surgery on left. Cataract surgery on both eyes  . LAPAROTOMY  ?2000   Pelvic mass (Benign)  . REVISION OF ARTERIOVENOUS GORETEX GRAFT Left 03/27/2016   Procedure: REVISION OF LEFT ARTERIOVENOUS GORETEX GRAFT;  Surgeon: Fransisco Hertz, MD;  Location: [facility] OR;  Service: Vascular;  Laterality: Left;  . TONSILLECTOMY     Family History  Problem Relation Age of Onset  . Diabetes Mother   . Heart disease  Mother   . COPD Father   . Hypertension Father   . Lung disease Father   . Diabetes Father   . Diabetes Sister   . Breast cancer Sister    Social History:  reports that she quit smoking about 17 years ago. Her smoking use included Cigarettes. She has never used smokeless tobacco. She reports that she does not drink alcohol or use drugs. Allergies  Allergen Reactions  . Iodinated Diagnostic Agents Hives and Itching  . Lactose Intolerance (Gi) Other (See Comments)    Abdominal pains and bloating   Prior to Admission medications   Medication Sig Start Date End Date Taking? Authorizing Provider  acetaminophen (TYLENOL) 650 MG CR tablet Take 650 mg by mouth every 6 (six) hours.   Yes Historical Provider, MD  amLODipine  (NORVASC) 10 MG tablet Take 10 mg by mouth at bedtime.    Yes Historical Provider, MD  aspirin EC 81 MG tablet Take 81 mg by mouth at bedtime.    Yes Historical Provider, MD  calcium acetate (PHOSLO) 667 MG capsule Take 667-1,334 mg by mouth See admin instructions. Take 2 capsules (1334 mg) by mouth 3 times daily with meals and 1 capsule (667 mg) with snacks   Yes Historical Provider, MD  cinacalcet (SENSIPAR) 60 MG tablet Take 60 mg by mouth 2 (two) times a week. Wednesdays and Saturdays   Yes Historical Provider, MD  cyclobenzaprine (FLEXERIL) 5 MG tablet Take 1 tablet (5 mg total) by mouth 2 (two) times daily as needed for muscle spasms. Patient taking differently: Take 5 mg by mouth 2 (two) times daily.  08/26/16  Yes Shawn C Joy, PA-C  fenofibrate (TRICOR) 145 MG tablet Take 145 mg by mouth daily at 12 noon.    Yes Historical Provider, MD  fish oil-omega-3 fatty acids 1000 MG capsule Take 1 g by mouth at bedtime.    Yes Historical Provider, MD  HYDROcodone-acetaminophen (NORCO/VICODIN) 5-325 MG tablet Take 1 tablet by mouth every 6 (six) hours as needed for pain. 09/19/16  Yes Historical Provider, MD  insulin aspart (NOVOLOG) 100 UNIT/ML injection Inject 0-9 Units into the skin 3 (three) times daily with meals. Patient taking differently: Inject 2-5 Units into the skin 3 (three) times daily as needed for high blood sugar (CBG >150). Per sliding scale 10/18/15  Yes Jeralyn BennettEzequiel Zamora, MD  isosorbide mononitrate (IMDUR) 30 MG 24 hr tablet Take 15 mg by mouth daily at 12 noon.   Yes Historical Provider, MD  labetalol (NORMODYNE) 300 MG tablet Take 300 mg by mouth See admin instructions. Take 1 tablet (300 mg) by mouth twice daily - at noon and at bedtime 10/01/16  Yes Historical Provider, MD  losartan (COZAAR) 100 MG tablet Take 100 mg by mouth at bedtime.    Yes Historical Provider, MD  multivitamin (RENA-VIT) TABS tablet Take 1 tablet by mouth at bedtime.    Yes Historical Provider, MD  Naphazoline HCl  (CLEAR EYES OP) Place 1 drop into both eyes as needed (for dry eyes).   Yes Historical Provider, MD  oxyCODONE-acetaminophen (PERCOCET/ROXICET) 5-325 MG tablet Take 1-2 tablets by mouth every 6 (six) hours as needed for severe pain. 08/26/16  Yes Shawn C Joy, PA-C  OXYGEN Inhale into the lungs as needed (shortness of breath).   Yes Historical Provider, MD  RaNITidine HCl (ZANTAC PO) Take 1 tablet by mouth daily at 12 noon.   Yes Historical Provider, MD  Travoprost, BAK Free, (TRAVATAN) 0.004 % SOLN ophthalmic solution Place  1 drop into both eyes at bedtime.   Yes Historical Provider, MD  gabapentin (NEURONTIN) 100 MG capsule Take 1 capsule (100 mg total) by mouth 3 (three) times daily. Patient not taking: Reported on 12/29/2016 08/30/16   Audry Pili, PA-C   Current Facility-Administered Medications  Medication Dose Route Frequency Provider Last Rate Last Dose  . 0.9 %  sodium chloride infusion  100 mL Intravenous PRN Bufford Buttner, MD      . 0.9 %  sodium chloride infusion  100 mL Intravenous PRN Bufford Buttner, MD      . acetaminophen (TYLENOL) tablet 650 mg  650 mg Oral Q6H PRN Jonah Blue, MD       Or  . acetaminophen (TYLENOL) suppository 650 mg  650 mg Rectal Q6H PRN Jonah Blue, MD      . alteplase (CATHFLO ACTIVASE) injection 2 mg  2 mg Intracatheter Once PRN Bufford Buttner, MD      . amLODipine (NORVASC) tablet 10 mg  10 mg Oral QHS Jonah Blue, MD   10 mg at 12/29/16 2212  . aspirin EC tablet 81 mg  81 mg Oral QHS Jonah Blue, MD   81 mg at 12/29/16 2219  . calcium acetate (PHOSLO) capsule 1,334 mg  1,334 mg Oral TID WC Jonah Blue, MD   1,334 mg at 12/30/16 0909  . calcium acetate (PHOSLO) capsule 667 mg  667 mg Oral PRN Jonah Blue, MD      . calcium carbonate (dosed in mg elemental calcium) suspension 500 mg of elemental calcium  500 mg of elemental calcium Oral Q6H PRN Jonah Blue, MD      . camphor-menthol [facility]) lotion 1 application  1 application Topical Q8H  PRN Jonah Blue, MD       And  . hydrOXYzine (ATARAX/VISTARIL) tablet 25 mg  25 mg Oral Q8H PRN Jonah Blue, MD      . Melene Muller ON 01/01/2017] cinacalcet (SENSIPAR) tablet 60 mg  60 mg Oral Once per day on Wed Sat Jonah Blue, MD      . cyclobenzaprine (FLEXERIL) tablet 5 mg  5 mg Oral BID Jonah Blue, MD   5 mg at 12/30/16 0909  . docusate sodium (ENEMEEZ) enema 283 mg  1 enema Rectal PRN Jonah Blue, MD      . enoxaparin (LOVENOX) injection 30 mg  30 mg Subcutaneous Q24H Jonah Blue, MD   30 mg at 12/29/16 2219  . feeding supplement (NEPRO CARB STEADY) liquid 237 mL  237 mL Oral TID PRN Jonah Blue, MD      . ferric gluconate (NULECIT) 125 mg in sodium chloride 0.9 % 100 mL IVPB  125 mg Intravenous Q M,W,F-HD Bufford Buttner, MD      . gabapentin (NEURONTIN) capsule 100 mg  100 mg Oral TID Jonah Blue, MD   100 mg at 12/30/16 0908  . gi cocktail (Maalox,Lidocaine,Donnatal)  30 mL Oral QID PRN Jonah Blue, MD      . heparin injection 1,000 Units  1,000 Units Dialysis PRN Bufford Buttner, MD      . heparin injection 1,000 Units  1,000 Units Dialysis PRN Bufford Buttner, MD      . hydrALAZINE (APRESOLINE) injection 10 mg  10 mg Intravenous Q4H PRN Jonah Blue, MD      . HYDROcodone-acetaminophen (NORCO/VICODIN) 5-325 MG per tablet 1 tablet  1 tablet Oral Q6H PRN Jonah Blue, MD      . insulin aspart (novoLOG) injection 0-9 Units  0-9 Units Subcutaneous TID WC  Jonah Blue, MD      . isosorbide mononitrate (IMDUR) 24 hr tablet 15 mg  15 mg Oral Q1200 Jonah Blue, MD      . labetalol (NORMODYNE) tablet 300 mg  300 mg Oral 2 times per day Jonah Blue, MD   300 mg at 12/29/16 2223  . latanoprost (XALATAN) 0.005 % ophthalmic solution 1 drop  1 drop Both Eyes QHS Jonah Blue, MD   1 drop at 12/29/16 2316  . lidocaine (PF) (XYLOCAINE) 1 % injection 5 mL  5 mL Intradermal PRN Bufford Buttner, MD      . lidocaine-prilocaine (EMLA) cream 1 application  1 application  Topical PRN Bufford Buttner, MD      . losartan (COZAAR) tablet 100 mg  100 mg Oral QHS Jonah Blue, MD   100 mg at 12/29/16 2216  . morphine 2 MG/ML injection 2 mg  2 mg Intravenous Q2H PRN Jonah Blue, MD      . multivitamin (RENA-VIT) tablet 1 tablet  1 tablet Oral QHS Jonah Blue, MD   1 tablet at 12/29/16 2211  . omega-3 acid ethyl esters (LOVAZA) capsule 1 g  1 g Oral QHS Jonah Blue, MD   1 g at 12/29/16 2216  . ondansetron (ZOFRAN) tablet 4 mg  4 mg Oral Q6H PRN Jonah Blue, MD       Or  . ondansetron [facility]) injection 4 mg  4 mg Intravenous Q6H PRN Jonah Blue, MD      . oxyCODONE-acetaminophen (PERCOCET/ROXICET) 5-325 MG per tablet 1-2 tablet  1-2 tablet Oral Q6H PRN Jonah Blue, MD   2 tablet at 12/30/16 1144  . pentafluoroprop-tetrafluoroeth (GEBAUERS) aerosol 1 application  1 application Topical PRN Bufford Buttner, MD      . sorbitol 70 % solution 30 mL  30 mL Oral PRN Jonah Blue, MD      . zolpidem [facility]) tablet 5 mg  5 mg Oral QHS PRN Jonah Blue, MD   5 mg at 12/30/16 0021   Labs: Basic Metabolic Panel:  Recent Labs Lab 12/29/16 1558 12/29/16 2132  NA 132* 132*  K 5.1 5.0  CL 90* 90*  CO2 21* 22  GLUCOSE 110* 82  BUN 35* 38*  CREATININE 6.40* 6.70*  CALCIUM 9.1 9.0  PHOS  --  3.4   Liver Function Tests:  Recent Labs Lab 12/29/16 2132  ALBUMIN 3.7   CBC:  Recent Labs Lab 12/29/16 1558 12/29/16 2132  WBC 6.3 6.4  HGB 10.9* 10.8*  HCT 35.3* 35.0*  MCV 92.7 91.9  PLT 362 377   Cardiac Enzymes:  Recent Labs Lab 12/29/16 2132 12/30/16 0342 12/30/16 0833  TROPONINI 0.19* 0.18* 0.20*   CBG:  Recent Labs Lab 12/29/16 2123 12/30/16 0752 12/30/16 1127  GLUCAP 89 90 123*   Studies/Results: Dg Chest 2 View  Result Date: 12/29/2016 CLINICAL DATA:  Mid chest pain. EXAM: CHEST  2 VIEW COMPARISON:  December 24, 2016 FINDINGS: Mild atelectasis in the left lung base. Stable dialysis catheter. Stable cardiomegaly. No  other interval change or acute abnormality. IMPRESSION: No active cardiopulmonary disease. Electronically Signed   By: Gerome Sam III M.D   On: 12/29/2016 16:36    ROS: As per HPI otherwise negative.  Physical Exam: Vitals:   12/29/16 2038 12/29/16 2211 12/29/16 2223 12/30/16 0540  BP: (!) 180/82 (!) 180/82 (!) 180/82 (!) 152/81  Pulse: 94  90 92  Resp:      Temp: 98 F (36.7 C)   98.9 F (  37.2 C)  TempSrc: Oral   Oral  SpO2: 100%   97%  Weight: 75.3 kg (166 lb 1.6 oz)   74.8 kg (164 lb 14.5 oz)  Height:    5\' 6"  (1.676 m)     General: AAF - intermittently rocking in pain Head: NCAT sclera not icteric MMM Neck: Supple.  Lungs: CTA bilaterally without wheezes, rales, or rhonchi. Breathing is unlabored. Heart: RRR with S1 S2.  Abdomen: soft NT + BS Lower extremities:without edema  Neuro: A & O  X 3. Moves all extremities spontaneously. Psych:  Responds to questions appropriately with a normal affect. Dialysis Access:right lower AVGG + bruit and right IJ  Dialysis Orders: [facility] MWF EDW 71.5 4 hr 400/A1.5 2 K 2.25 Ca right IJ and right lower AVGG - ok to use heaprin 3000 venofer 100 through 2/7 Mircera 150 q 2 last given 1/24 Recent labs: hgb 11 1/29 20% sat ferritin 1650 iPTH 666 Ca/P ok prev on 0.25 calcitriol   Assessment/Plan: 1. Chest pain - troponins -^ some but flat; cards seeing- none at present 2. ESRD -  K 5 on 2 K bath - ok to start using AVGG( per Dr. Sondra Come her vascular surgeon last [facility]) this should take care of ^ Ks due to reduced catheter flow without increasing time - will also increase DFR to 800 at discharge 3. Hypertension/volume  - gets +/- EDW - gains variable; try to titrate to EDW today 4. Anemia  - hgb 10.8 - continue Fe - due for ESA Wed- likely needs lower ESA- will order for 100 Aranesp if still here 5. Metabolic bone disease -  ^ ipTH - resume calcitriol - on sensipar and phoslo 6. Nutrition - renal diet/vit 7. Arthritis - needs to be the crux of most  of her pain now - expressing that it is 10/10 and intermittently rocking in pain while in bed low back and left ankle hurt must- defer to primary  Sheffield Slider, PA-C [facility] Beeper [phone] 12/30/2016, 11:47 AM   Pt seen, examined and agree w A/P as above.  Vinson Moselle MD [employer] pager [phone]   12/30/2016, 2:10 PM

## 2016-12-30 NOTE — Plan of Care (Signed)
Problem: Physical Regulation: Goal: Will remain free from infection Outcome: Completed/Met Date Met: 12/30/16 No skin issues noted

## 2016-12-30 NOTE — Care Management Obs Status (Signed)
MEDICARE OBSERVATION STATUS NOTIFICATION   Patient Details  Name: Colleen PlowmanDianne Mullins MRN: [id number] Date of Birth: 10/21/1946   Medicare Observation Status Notification Given:  Yes    Gala LewandowskyGraves-Bigelow, Geralynn Capri [place]e, RN 12/30/2016, 2:37 PM

## 2016-12-30 NOTE — Plan of Care (Signed)
Problem: Skin Integrity: Goal: Risk for impaired skin integrity will decrease Outcome: Completed/Met Date Met: 12/30/16 Patient's skin remains clean, dry and intact with no s/s of complications noted.

## 2016-12-31 LAB — HEMOGLOBIN A1C
HEMOGLOBIN A1C: 5.6 % (ref 4.8–5.6)
Mean Plasma Glucose: 114 mg/dL

## 2016-12-31 NOTE — Progress Notes (Signed)
Patient D/C'd via wheelchair with her belongings, scripts and D/C instructions in hand. Pt received a taxi voucher for her trip home.

## 2016-12-31 NOTE — Progress Notes (Signed)
Report received via Genevie CheshireBilly RN from Dialysis. Pt had 4.5 Liters removed in Dialysis and tolefrated well, will come back to me, have her dinner and medications, review her discharge instructions and send her home with her belongings, scripts, D/C instructions in stable condition. Pt's IV removed upon arrival to unit with catheter intact, dressing applied and pt. Tolerated well. Right upper arm AV fistula with dressing clean, dry and intact with positive bruit and theill and no s/s of complications.

## 2017-02-28 ENCOUNTER — Encounter: Payer: Self-pay | Admitting: Cardiology

## 2017-03-18 ENCOUNTER — Encounter: Payer: Self-pay | Admitting: Cardiology

## 2017-03-18 ENCOUNTER — Ambulatory Visit (INDEPENDENT_AMBULATORY_CARE_PROVIDER_SITE_OTHER): Payer: Medicare HMO | Admitting: Cardiology

## 2017-03-18 VITALS — BP 130/58 | HR 87 | Ht 66.0 in | Wt 166.8 lb

## 2017-03-18 DIAGNOSIS — I2510 Atherosclerotic heart disease of native coronary artery without angina pectoris: Secondary | ICD-10-CM

## 2017-03-18 DIAGNOSIS — I739 Peripheral vascular disease, unspecified: Secondary | ICD-10-CM | POA: Diagnosis not present

## 2017-03-18 DIAGNOSIS — E7800 Pure hypercholesterolemia, unspecified: Secondary | ICD-10-CM | POA: Diagnosis not present

## 2017-03-18 DIAGNOSIS — I10 Essential (primary) hypertension: Secondary | ICD-10-CM

## 2017-03-18 HISTORY — DX: Peripheral vascular disease, unspecified: I73.9

## 2017-03-18 NOTE — Progress Notes (Signed)
Cardiology Office Note    Date:  03/18/2017   ID:  Colleen Mullins, DOB 04/13/1946, MRN [id number]  PCP:  Colleen Shutter, MD  Cardiologist:  Colleen Magic, MD   No chief complaint on file.   History of Present Illness:  Colleen Mullins is a 71 y.o. female with a history of ASCAD with  cath showing 60% D1 and 50% PL off the RCA by cath 06/2016 on medical management, DM, ESRD on HD, GERD, HTN and hyperlipidemia.  She had been throwing up at home and her nephrologist took her off her statin but there was no improvement.  She is still off the statin.  She presents back today doing well. She denies any chest pain or pressure, SOB, DOE, PND, orthopnea, LE edema, dizziness, papitations or syncope.      Past Medical History:  Diagnosis Date  . Anemia    low iron  . Arthritis   . CAD in native artery 08/12/2016   a. Nonobstructive ASCAD by cath with 60% D1 and 50% PL off RCA and 25% mid LAD.  . Diabetes mellitus    type 2  . Diabetic retinopathy (HCC)   . Dialysis patient [facility])    M-W-F @ [facility]  . Diastolic dysfunction   . Ejection fraction   . ESRD on hemodialysis [facility])    Dialysis T/Th/Sa  . GERD (gastroesophageal reflux disease)   . Glaucoma   . H/O: Bell's palsy 2007  . Hyperlipidemia   . Hypertension    a. labile - hx of BP dropping at HD.  . Macular degeneration   . PAD (peripheral artery disease) (HCC)    a. ABI 08/2016: mod RLE disease, normal L ABI.  Marland Kitchen PVD (peripheral vascular disease) (HCC) 03/18/2017    Past Surgical History:  Procedure Laterality Date  . ABDOMINAL HYSTERECTOMY    . ARTERIOVENOUS GRAFT PLACEMENT     Left forearm x 2, one removed in March  . ARTERIOVENOUS GRAFT PLACEMENT     Left forearm  . AVGG REMOVAL  10/08/2011   Procedure: REMOVAL OF ARTERIOVENOUS GORETEX GRAFT (AVGG);  Surgeon: Sherren Kerns, MD;  Location: [facility] OR;  Service: Vascular;  Laterality: Left;  . BTL    . CARDIAC CATHETERIZATION N/A 07/10/2016   Procedure: Left Heart Cath and  Coronary Angiography;  Surgeon: Yvonne Kendall, MD;  Location: [facility] INVASIVE CV LAB;  Service: Cardiovascular;  Laterality: N/A;  . COLONOSCOPY    . EXCHANGE OF A DIALYSIS CATHETER Right 03/27/2016   Procedure: EXCHANGE OF A DIALYSIS CATHETER;  Surgeon: Fransisco Hertz, MD;  Location: [facility] OR;  Service: Vascular;  Laterality: Right;  . EYE SURGERY Left    Lasik surgery on left. Cataract surgery on both eyes  . LAPAROTOMY  ?2000   Pelvic mass (Benign)  . REVISION OF ARTERIOVENOUS GORETEX GRAFT Left 03/27/2016   Procedure: REVISION OF LEFT ARTERIOVENOUS GORETEX GRAFT;  Surgeon: Fransisco Hertz, MD;  Location: [facility] OR;  Service: Vascular;  Laterality: Left;  . TONSILLECTOMY      Current Medications: Current Meds  Medication Sig  . acetaminophen (TYLENOL) 650 MG CR tablet Take 650 mg by mouth every 6 (six) hours.  Marland Kitchen amLODipine (NORVASC) 10 MG tablet Take 10 mg by mouth at bedtime.   Marland Kitchen aspirin EC 81 MG tablet Take 81 mg by mouth at bedtime.   . calcium acetate (PHOSLO) 667 MG capsule Take 667-1,334 mg by mouth See admin instructions. Take 2 capsules (1334 mg) by mouth 3 times daily  with meals and 1 capsule (667 mg) with snacks  . cinacalcet (SENSIPAR) 60 MG tablet Take 60 mg by mouth 2 (two) times a week. Wednesdays and Saturdays  . cyclobenzaprine (FLEXERIL) 5 MG tablet Take 1 tablet (5 mg total) by mouth 2 (two) times daily as needed for muscle spasms. (Patient taking differently: Take 5 mg by mouth 2 (two) times daily. )  . gabapentin (NEURONTIN) 100 MG capsule Take 1 capsule (100 mg total) by mouth 3 (three) times daily.  Marland Kitchen HYDROcodone-acetaminophen (NORCO/VICODIN) 5-325 MG tablet Take 1 tablet by mouth every 6 (six) hours as needed for pain.  Marland Kitchen insulin aspart (NOVOLOG) 100 UNIT/ML injection Inject 0-9 Units into the skin 3 (three) times daily with meals. (Patient taking differently: Inject 2-5 Units into the skin 3 (three) times daily as needed for high blood sugar (CBG >150). Per sliding scale)  .  isosorbide mononitrate (IMDUR) 30 MG 24 hr tablet Take 15 mg by mouth daily at 12 noon.  . labetalol (NORMODYNE) 300 MG tablet Take 300 mg by mouth See admin instructions. Take 1 tablet (300 mg) by mouth twice daily - at noon and at bedtime  . losartan (COZAAR) 100 MG tablet Take 100 mg by mouth at bedtime.   . multivitamin (RENA-VIT) TABS tablet Take 1 tablet by mouth at bedtime.   . Naphazoline HCl (CLEAR EYES OP) Place 1 drop into both eyes as needed (for dry eyes).  Marland Kitchen oxyCODONE-acetaminophen (PERCOCET/ROXICET) 5-325 MG tablet Take 1-2 tablets by mouth every 6 (six) hours as needed for severe pain.  . OXYGEN Inhale into the lungs as needed (shortness of breath).  . RaNITidine HCl (ZANTAC PO) Take 1 tablet by mouth daily at 12 noon.  . Travoprost, BAK Free, (TRAVATAN) 0.004 % SOLN ophthalmic solution Place 1 drop into both eyes at bedtime.    Allergies:   Iodinated diagnostic agents and Lactose intolerance (gi)   Social History   Social History  . Marital status: Widowed    Spouse name: N/A  . Number of children: N/A  . Years of education: N/A   Occupational History  . retired    Social History Main Topics  . Smoking status: Former Smoker    Types: Cigarettes    Quit date: 08/04/1999  . Smokeless tobacco: Never Used  . Alcohol use No  . Drug use: No  . Sexual activity: Not Asked   Other Topics Concern  . None   Social History Narrative  . None     Family History:  The patient's family history includes Breast cancer in her sister; COPD in her father; Diabetes in her father, mother, and sister; Heart disease in her mother; Hypertension in her father; Lung disease in her father.   ROS:   Please see the history of present illness.    ROS Mullins other systems reviewed and are negative.  No flowsheet data found.     PHYSICAL EXAM:   VS:  BP (!) 130/58   Pulse 87   Ht  (1.676 m)   Wt 166 lb 12.8 oz (75.7 kg)   SpO2 93%   BMI 26.92 kg/m    GEN: Well nourished,  well developed, in no acute distress  HEENT: normal  Neck: no JVD, carotid bruits, or masses Cardiac: RRR; no  rubs, or gallops,no edema.  Intact distal pulses bilaterally. 1/6 SM at RUSB Respiratory:  clear to auscultation bilaterally, normal work of breathing GI: soft, nontender, nondistended, + BS MS: no deformity or atrophy  Skin: warm and dry, no rash Neuro:  Alert and Oriented x 3, Strength and sensation are intact Psych: euthymic mood, full affect  Wt Readings from Last 3 Encounters:  03/18/17 166 lb 12.8 oz (75.7 kg)  12/30/16 158 lb 11.7 oz (72 kg)  10/15/16 160 lb (72.6 kg)      Studies/Labs Reviewed:   EKG:  EKG is ordered today.  The ekg ordered today demonstrates   Recent Labs: 04/14/2016: Magnesium 2.8 10/15/2016: ALT 18 12/29/2016: BUN 38; Creatinine, Ser 6.70; Hemoglobin 10.8; Platelets 377; Potassium 5.0; Sodium 132 12/30/2016: TSH 1.267   Lipid Panel    Component Value Date/Time   CHOL 95 12/30/2016 0342   TRIG 218 (H) 12/30/2016 0342   HDL 24 (L) 12/30/2016 0342   CHOLHDL 4.0 12/30/2016 0342   VLDL 44 (H) 12/30/2016 0342   LDLCALC 27 12/30/2016 0342    Additional studies/ records that were reviewed today include:  none    ASSESSMENT:    1. CAD in native artery   2. Benign essential HTN   3. Pure hypercholesterolemia   4. PVD (peripheral vascular disease) (HCC)      PLAN:  In order of problems listed above:  1. ASCAD with cath showing 60% D1 and 50% PL off the RCA by cath 06/2016 - she has not had any further anginal CP. She was in the hospital in February of 2018 with minimal elevated trop felt secondary to her ESRD and no further workup was recommended as she had had a cath 6 months prior.  It was felt that her pain was from GERD as the vomiting started first and then she had CP.  She has not had any more. She will continue on ASA, long acting nitrate and BB.  2. HTN - BP is controlled on exam today. She will continue on amlodipine and  BB. 3. Hyperlipidemia with LDL goal < 70.  She is currently not on lipid lowering agent due to nasuea but this did not stop after she went off the med.  I will check an FLP and ALT and then restart statin.  4. PVD with LE arterial dopplers showing moderate disease in the RLE - she denies any claudication symptoms.     Medication Adjustments/Labs and Tests Ordered: Current medicines are reviewed at length with the patient today.  Concerns regarding medicines are outlined above.  Medication changes, Labs and Tests ordered today are listed in the Patient Instructions below.  There are no Patient Instructions on file for this visit.   Signed, Colleen Magic, MD  03/18/2017 3:08 PM    [facility] [address] [place], [place], [place]  [address] Phone: [phone]; Fax: [phone]

## 2017-03-18 NOTE — Patient Instructions (Signed)
Medication Instructions:  Your physician recommends that you continue on your current medications as directed. Please refer to the Current Medication list given to you today.   Labwork: Your physician recommends that you return for FASTING lab work.  Testing/Procedures: None  Follow-Up: Your physician wants you to follow-up in: 1 year with Dr. Turner. You will receive a reminder letter in the mail two months in advance. If you don't receive a letter, please call our office to schedule the follow-up appointment.   Any Other Special Instructions Will Be Listed Below (If Applicable).     If you need a refill on your cardiac medications before your next appointment, please call your pharmacy.   

## 2017-03-25 ENCOUNTER — Emergency Department (HOSPITAL_COMMUNITY)
Admission: EM | Admit: 2017-03-25 | Discharge: 2017-03-25 | Disposition: A | Discharge status: Home / Self Care | Payer: Medicare HMO | Attending: Emergency Medicine | Admitting: Emergency Medicine

## 2017-03-25 ENCOUNTER — Encounter (HOSPITAL_COMMUNITY): Payer: Self-pay | Admitting: Emergency Medicine

## 2017-03-25 ENCOUNTER — Emergency Department (HOSPITAL_COMMUNITY): Payer: Medicare HMO

## 2017-03-25 ENCOUNTER — Other Ambulatory Visit: Payer: Medicare HMO | Admitting: *Deleted

## 2017-03-25 DIAGNOSIS — S8991XA Unspecified injury of right lower leg, initial encounter: Secondary | ICD-10-CM | POA: Diagnosis present

## 2017-03-25 DIAGNOSIS — E1122 Type 2 diabetes mellitus with diabetic chronic kidney disease: Secondary | ICD-10-CM | POA: Insufficient documentation

## 2017-03-25 DIAGNOSIS — M79631 Pain in right forearm: Secondary | ICD-10-CM | POA: Insufficient documentation

## 2017-03-25 DIAGNOSIS — Y999 Unspecified external cause status: Secondary | ICD-10-CM | POA: Insufficient documentation

## 2017-03-25 DIAGNOSIS — N186 End stage renal disease: Secondary | ICD-10-CM | POA: Diagnosis not present

## 2017-03-25 DIAGNOSIS — Y9289 Other specified places as the place of occurrence of the external cause: Secondary | ICD-10-CM | POA: Insufficient documentation

## 2017-03-25 DIAGNOSIS — Z992 Dependence on renal dialysis: Secondary | ICD-10-CM | POA: Insufficient documentation

## 2017-03-25 DIAGNOSIS — Y939 Activity, unspecified: Secondary | ICD-10-CM | POA: Diagnosis not present

## 2017-03-25 DIAGNOSIS — I2510 Atherosclerotic heart disease of native coronary artery without angina pectoris: Secondary | ICD-10-CM | POA: Insufficient documentation

## 2017-03-25 DIAGNOSIS — Z794 Long term (current) use of insulin: Secondary | ICD-10-CM | POA: Diagnosis not present

## 2017-03-25 DIAGNOSIS — Z7982 Long term (current) use of aspirin: Secondary | ICD-10-CM | POA: Diagnosis not present

## 2017-03-25 DIAGNOSIS — I120 Hypertensive chronic kidney disease with stage 5 chronic kidney disease or end stage renal disease: Secondary | ICD-10-CM | POA: Diagnosis not present

## 2017-03-25 DIAGNOSIS — S8992XA Unspecified injury of left lower leg, initial encounter: Secondary | ICD-10-CM | POA: Diagnosis not present

## 2017-03-25 DIAGNOSIS — R51 Headache: Secondary | ICD-10-CM | POA: Diagnosis not present

## 2017-03-25 DIAGNOSIS — W19XXXA Unspecified fall, initial encounter: Secondary | ICD-10-CM

## 2017-03-25 DIAGNOSIS — M25572 Pain in left ankle and joints of left foot: Secondary | ICD-10-CM | POA: Diagnosis not present

## 2017-03-25 DIAGNOSIS — Z87891 Personal history of nicotine dependence: Secondary | ICD-10-CM | POA: Diagnosis not present

## 2017-03-25 DIAGNOSIS — Z79899 Other long term (current) drug therapy: Secondary | ICD-10-CM | POA: Insufficient documentation

## 2017-03-25 DIAGNOSIS — E7800 Pure hypercholesterolemia, unspecified: Secondary | ICD-10-CM

## 2017-03-25 LAB — LIPID PANEL
CHOLESTEROL TOTAL: 146 mg/dL (ref 100–199)
Chol/HDL Ratio: 3.6 ratio (ref 0.0–4.4)
HDL: 41 mg/dL (ref >39)
LDL Calculated: 77 mg/dL (ref 0–99)
TRIGLYCERIDES: 140 mg/dL (ref 0–149)
VLDL CHOLESTEROL CAL: 28 mg/dL (ref 5–40)

## 2017-03-25 LAB — HEPATIC FUNCTION PANEL
ALT: 9 IU/L (ref 0–32)
AST: 15 IU/L (ref 0–40)
Albumin: 4.1 g/dL (ref 3.5–4.8)
Alkaline Phosphatase: 82 IU/L (ref 39–117)
BILIRUBIN, DIRECT: 0.1 mg/dL (ref 0.00–0.40)
Bilirubin Total: 0.3 mg/dL (ref 0.0–1.2)
Total Protein: 6.2 g/dL (ref 6.0–8.5)

## 2017-03-25 MED ORDER — HYDROCODONE-ACETAMINOPHEN 5-325 MG PO TABS
1.0000 | ORAL_TABLET | Freq: Once | ORAL | Status: AC
  Administered 2017-03-25: 1 via ORAL
  Filled 2017-03-25: qty 1

## 2017-03-25 NOTE — ED Notes (Signed)
Gave pt pre-packed bag lunch ([place] sandwich & applesauce), per Cammy Copa - PA.
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
## 2017-03-25 NOTE — Discharge Instructions (Signed)

## 2017-03-25 NOTE — ED Notes (Signed)
Pt attempting to find ride home at this time. 
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
## 2017-06-11 LAB — CBC AND DIFFERENTIAL
HCT: 29 — AB (ref 36–46)
HEMOGLOBIN: 9.2 — AB (ref 12.0–16.0)
Platelets: 392 (ref 150–399)
WBC: 8

## 2017-06-12 LAB — BASIC METABOLIC PANEL
BUN: 36 — AB (ref 4–21)
CREATININE: 3.8 — AB (ref 0.5–1.1)
GLUCOSE: 274
POTASSIUM: 4.6 (ref 3.4–5.3)
Sodium: 135 — AB (ref 137–147)

## 2017-06-13 ENCOUNTER — Non-Acute Institutional Stay (SKILLED_NURSING_FACILITY): Payer: Medicare HMO | Admitting: Internal Medicine

## 2017-06-13 ENCOUNTER — Encounter: Payer: Self-pay | Admitting: Internal Medicine

## 2017-06-13 DIAGNOSIS — J690 Pneumonitis due to inhalation of food and vomit: Secondary | ICD-10-CM | POA: Diagnosis not present

## 2017-06-13 DIAGNOSIS — G8929 Other chronic pain: Secondary | ICD-10-CM

## 2017-06-13 DIAGNOSIS — E1122 Type 2 diabetes mellitus with diabetic chronic kidney disease: Secondary | ICD-10-CM

## 2017-06-13 DIAGNOSIS — I5033 Acute on chronic diastolic (congestive) heart failure: Secondary | ICD-10-CM | POA: Diagnosis not present

## 2017-06-13 DIAGNOSIS — R0789 Other chest pain: Secondary | ICD-10-CM

## 2017-06-13 DIAGNOSIS — N186 End stage renal disease: Secondary | ICD-10-CM

## 2017-06-13 DIAGNOSIS — J9621 Acute and chronic respiratory failure with hypoxia: Secondary | ICD-10-CM | POA: Diagnosis not present

## 2017-06-13 DIAGNOSIS — K219 Gastro-esophageal reflux disease without esophagitis: Secondary | ICD-10-CM

## 2017-06-13 DIAGNOSIS — I110 Hypertensive heart disease with heart failure: Secondary | ICD-10-CM | POA: Diagnosis not present

## 2017-06-13 DIAGNOSIS — M25511 Pain in right shoulder: Secondary | ICD-10-CM | POA: Diagnosis not present

## 2017-06-13 DIAGNOSIS — I5032 Chronic diastolic (congestive) heart failure: Secondary | ICD-10-CM | POA: Diagnosis not present

## 2017-06-13 DIAGNOSIS — Z992 Dependence on renal dialysis: Secondary | ICD-10-CM

## 2017-06-13 NOTE — Progress Notes (Signed)
: Provider:  Randon Goldsmith. Lyn Hollingshead, MD Location:  [facility] Room Number: 106 Place of Service:  SNF ([phone])  PCP: Zoila Shutter, MD Patient Care Team: Zoila Shutter, MD as PCP - General (Internal Medicine) Terrial Paolella, MD (Nephrology)  Extended Emergency Contact Information Primary Emergency Contact: Lanzer,Yvonne Address: [address]          [place], [place] [address] [place] Home Phone: [phone] Relation: Daughter Secondary Emergency Contact: Rema Fendt Address: [address]          [place], [place] [address] [place] Home Phone: [phone] Relation: Daughter     Allergies: Iodinated diagnostic agents and Lactose intolerance (gi)  Chief Complaint  Patient presents with  . New Admit To SNF    following hospitalization 05/29/17 to 06/12/17 chest pains    HPI: Patient is 71 y.o. female with ESRD on dialysis MWF, diabetes mellitus, hypertension, chronic arthritis affecting multiple joints on chronic narcotics, recent chest wall pain from a wheelchair accident who presented to [facility] regional ED with complaints of chest and right shoulder pain this is going on for approximately 2 months. Patient denies any headache, shortness of breath, nausea, vomiting, abdominal pain, 3 fever, chills, diaphoresis, cough, extremity weakness, numbness/tingling. She does have chronic shortness of breath with exertion and has home O2. Blood pressure in ED was 190/82, O2 sat was 100% troponin was 0.3, and chest x-ray show mild vascular congestion and cardiomegaly, EKG showed no ischemic changes. Cardiology advises serial troponins and admission. Patient was admitted to [facility] from 7/15-19 for chest pain where an acute coronary syndrome was ruled out. Hospital course was completed by her accelerated hypertension which was treated with increasing her blood pressure medications, acute on chronic hypoxic  respiratory failure secondary to acute on chronic diastolic congestive congestive heart failure, and possible aspiration pneumonia and treated initially with IV Zosyn then switched to Augmentin for 3 days and considered fully treated. All hospital problems were resolved and patient is admitted to skilled nursing facility with generalized weakness for OT/PT. While at skilled nursing facility patient will be followed for GERD treated with Nexium, DM 2 treated with NovoLog sliding scale insulin at meals and coronary artery disease treated with aspirin, Imdur, labetalol. Past Medical History:  Diagnosis Date  . Abnormal nuclear stress test 07/10/2016  . Anemia    low iron  . Anemia in chronic renal disease 07/08/2016  . Arthritis   . CAD in native artery 08/12/2016   a. Nonobstructive ASCAD by cath with 60% D1 and 50% PL off RCA and 25% mid LAD.  . Diabetes mellitus    type 2  . Diabetic retinopathy (HCC)   . Dialysis patient [facility])    M-W-F @ [facility]  . Diastolic dysfunction   . Dyspnea on exertion 04/14/2016  . Ejection fraction   . Elevated troponin 10/16/2015  . ESRD on dialysis (HCC) 04/14/2016  . ESRD on hemodialysis [facility])    Dialysis T/Th/Sa  . GERD (gastroesophageal reflux disease)   . Glaucoma   . H/O: Bell's palsy 2007  . Hyperlipidemia   . Hypertension    a. labile - hx of BP dropping at HD.  . Macular degeneration   . Musculoskeletal chest pain   . PAD (peripheral artery disease) (HCC)    a. ABI 08/2016: mod RLE disease, normal L ABI.  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 PVD (peripheral vascular disease) (HCC) 03/18/2017  .  Type 2 diabetes mellitus with renal complication (HCC) 09/26/2011    Past Surgical History:  Procedure Laterality Date  . ABDOMINAL HYSTERECTOMY    . ARTERIOVENOUS GRAFT PLACEMENT     Left forearm x 2, one removed in March  . ARTERIOVENOUS GRAFT PLACEMENT     Left forearm  . AVGG REMOVAL  10/08/2011   Procedure: REMOVAL OF  ARTERIOVENOUS GORETEX GRAFT (AVGG);  Surgeon: Sherren Kerns, MD;  Location: [facility] OR;  Service: Vascular;  Laterality: Left;  . BTL    . CARDIAC CATHETERIZATION N/A 07/10/2016   Procedure: Left Heart Cath and Coronary Angiography;  Surgeon: Yvonne Kendall, MD;  Location: [facility] INVASIVE CV LAB;  Service: Cardiovascular;  Laterality: N/A;  . COLONOSCOPY    . EXCHANGE OF A DIALYSIS CATHETER Right 03/27/2016   Procedure: EXCHANGE OF A DIALYSIS CATHETER;  Surgeon: Fransisco Hertz, MD;  Location: [facility] OR;  Service: Vascular;  Laterality: Right;  . EYE SURGERY Left    Lasik surgery on left. Cataract surgery on both eyes  . LAPAROTOMY  ?2000   Pelvic mass (Benign)  . REVISION OF ARTERIOVENOUS GORETEX GRAFT Left 03/27/2016   Procedure: REVISION OF LEFT ARTERIOVENOUS GORETEX GRAFT;  Surgeon: Fransisco Hertz, MD;  Location: [facility] OR;  Service: Vascular;  Laterality: Left;  . TONSILLECTOMY      Allergies as of 06/13/2017      Reactions   Iodinated Diagnostic Agents Hives, Itching   Lactose Intolerance (gi) Other (See Comments)   Abdominal pains and bloating      Medication List       Accurate as of 06/13/17  1:24 PM. Always use your most recent med list.          acetaminophen 650 MG CR tablet Commonly known as:  TYLENOL Take 650 mg by mouth every 8 (eight) hours as needed.   amLODipine 10 MG tablet Commonly known as:  NORVASC Take 10 mg by mouth at bedtime.   amoxicillin-clavulanate 500-125 MG tablet Commonly known as:  AUGMENTIN Take by mouth. Take one table daily for 2 days   aspirin EC 81 MG tablet Take 81 mg by mouth at bedtime.   calcium acetate 667 MG capsule Commonly known as:  PHOSLO Take 667-1,334 mg by mouth See admin instructions. Take 2 capsules (1334 mg) by mouth 3 times daily with meals and 1 capsule (667 mg) with snacks   cinacalcet 60 MG tablet Commonly known as:  SENSIPAR Take 60 mg by mouth daily. Monday, Wednesday and Friday   cloNIDine 0.2 MG tablet Commonly known as:   CATAPRES Take 0.2 mg by mouth. Take one tablet twice daily   cyclobenzaprine 5 MG tablet Commonly known as:  FLEXERIL Take 5 mg by mouth. Take one tablet three times a day as needed for muscle spasms   diphenhydrAMINE 50 MG tablet Commonly known as:  BENADRYL Take 50 mg by mouth. Take one tablet twice daily as needed for allergies   esomeprazole 40 MG capsule Commonly known as:  NEXIUM Take 40 mg by mouth at bedtime.   ethyl chloride spray Apply topically. For use at dialysis   gabapentin 100 MG capsule Commonly known as:  NEURONTIN Take 1 capsule (100 mg total) by mouth 3 (three) times daily.   hydrALAZINE 50 MG tablet Commonly known as:  APRESOLINE Take 50 mg by mouth. Take one tablet every 8 hours   HYDROcodone-acetaminophen 5-325 MG tablet Commonly known as:  NORCO/VICODIN Take 1 tablet by mouth. Take one tablet daily for  pain   insulin aspart 100 UNIT/ML injection Commonly known as:  novoLOG Inject into the skin. Inject three times a day before meals   isosorbide mononitrate 30 MG 24 hr tablet Commonly known as:  IMDUR Take 15 mg by mouth daily at 12 noon.   labetalol 300 MG tablet Commonly known as:  NORMODYNE Take 300 mg by mouth See admin instructions. Take 1 tablet (300 mg) by mouth twice daily - at noon and at bedtime   Lidocaine 4 % Ptch Apply topically. Place one patch on the skin nightly   losartan 100 MG tablet Commonly known as:  COZAAR Take 100 mg by mouth at bedtime.   OXYGEN Inhale into the lungs as needed (shortness of breath).   ranitidine 75 MG tablet Commonly known as:  ZANTAC Take 75 mg by mouth. Take one tablet nightly   RENAL-VITE 0.8 MG Tabs Take by mouth. Take one tablet every evening -Vitamin B complex-vitamin C folic acid   Travoprost (BAK Free) 0.004 % Soln ophthalmic solution Commonly known as:  TRAVATAN Place 1 drop into both eyes at bedtime.       Meds ordered this encounter  Medications  . amoxicillin-clavulanate  (AUGMENTIN) 500-125 MG tablet    Sig: Take by mouth. Take one table daily for 2 days  . hydrALAZINE (APRESOLINE) 50 MG tablet    Sig: Take 50 mg by mouth. Take one tablet every 8 hours  . cloNIDine (CATAPRES) 0.2 MG tablet    Sig: Take 0.2 mg by mouth. Take one tablet twice daily  . Lidocaine 4 % PTCH    Sig: Apply topically. Place one patch on the skin nightly  . cyclobenzaprine (FLEXERIL) 5 MG tablet    Sig: Take 5 mg by mouth. Take one tablet three times a day as needed for muscle spasms  . diphenhydrAMINE (BENADRYL) 50 MG tablet    Sig: Take 50 mg by mouth. Take one tablet twice daily as needed for allergies  . ethyl chloride spray    Sig: Apply topically. For use at dialysis  . insulin aspart (NOVOLOG) 100 UNIT/ML injection    Sig: Inject into the skin. Inject three times a day before meals  . ranitidine (ZANTAC) 75 MG tablet    Sig: Take 75 mg by mouth. Take one tablet nightly  . B Complex-C-Folic Acid (RENAL-VITE) 0.8 MG TABS    Sig: Take by mouth. Take one tablet every evening -Vitamin B complex-vitamin C folic acid    Immunization History  Administered Date(s) Administered  . Hepatitis A, Adult 09/17/2012, 03/11/2013  . Influenza-Unspecified 08/28/2016  . Pneumococcal Conjugate-13 08/20/2012    Social History  Substance Use Topics  . Smoking status: Former Smoker    Types: Cigarettes    Quit date: 08/04/1999  . Smokeless tobacco: Never Used  . Alcohol use No    Family history is   Family History  Problem Relation Age of Onset  . Diabetes Mother   . Heart disease Mother   . COPD Father   . Hypertension Father   . Lung disease Father   . Diabetes Father   . Diabetes Sister   . Breast cancer Sister       Review of Systems  DATA OBTAINED: from patient,  family member GENERAL:  no fevers, fatigue, appetite changes SKIN: No itching, or rash EYES: No eye pain, redness, discharge EARS: No earache, tinnitus, change in hearing NOSE: No congestion, drainage or  bleeding  MOUTH/THROAT: No mouth or tooth pain, No  sore throat RESPIRATORY: No cough, wheezing, SOB CARDIAC: No chest pain, palpitations, lower extremity edema  GI: No abdominal pain, No N/V/D or constipation, No heartburn or reflux  GU: No dysuria, frequency or urgency, or incontinence  MUSCULOSKELETAL: No unrelieved bone/joint pain NEUROLOGIC: No headache, dizziness or focal weakness PSYCHIATRIC: No c/o anxiety or sadness   Vitals:   06/13/17 1135  BP: (!) 158/80  Pulse: 82  Resp: 18  Temp: 98.4 F (36.9 C)    SpO2 Readings from Last 1 Encounters:  06/13/17 97%   Body mass index is 26.15 kg/m.     Physical Exam  GENERAL APPEARANCE: Alert, conversant,  No acute distress.  SKIN: No diaphoresis rash HEAD: Normocephalic, atraumatic  EYES: Conjunctiva/lids clear. Pupils round, reactive. EOMs intact.  EARS: External exam WNL, canals clear. Hearing grossly normal.  NOSE: No deformity or discharge.  MOUTH/THROAT: Lips w/o lesions  RESPIRATORY: Breathing is even, unlabored. Lung sounds are clear   CARDIOVASCULAR: Heart RRR no murmurs, rubs or gallops. No peripheral edema.   GASTROINTESTINAL: Abdomen is soft, non-tender, not distended w/ normal bowel sounds. GENITOURINARY: Bladder non tender, not distended  MUSCULOSKELETAL: No abnormal joints or musculature NEUROLOGIC:  Cranial nerves 2-12 grossly intact. Moves all extremities  PSYCHIATRIC: Mood and affect appropriate to situation, no behavioral issues  Patient Active Problem List   Diagnosis Date Noted  . PVD (peripheral vascular disease) (HCC) 03/18/2017  . CAD in native artery 08/12/2016  . Abnormal nuclear stress test 07/10/2016  . Musculoskeletal chest pain   . Anemia in chronic renal disease 07/08/2016  . Dyspnea on exertion 04/14/2016  . ESRD on dialysis (HCC) 04/14/2016  . Benign essential HTN 04/14/2016  . Hyperlipidemia 04/14/2016  . 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 Hypoglycemia  10/16/2015  . Elevated troponin 10/16/2015  . Ejection fraction   . Type 2 diabetes mellitus with renal complication (HCC) 09/26/2011  . Other complications due to renal dialysis device, implant, and graft 09/26/2011      Labs reviewed: Basic Metabolic Panel:    Component Value Date/Time   NA 132 (L) 12/29/2016 2132   K 5.0 12/29/2016 2132   CL 90 (L) 12/29/2016 2132   CO2 22 12/29/2016 2132   GLUCOSE 82 12/29/2016 2132   BUN 38 (H) 12/29/2016 2132   CREATININE 6.70 (H) 12/29/2016 2132   CALCIUM 9.0 12/29/2016 2132   CALCIUM 9.3 08/17/2010 0330   PROT 6.2 03/25/2017 0747   ALBUMIN 4.1 03/25/2017 0747   AST 15 03/25/2017 0747   ALT 9 03/25/2017 0747   ALKPHOS 82 03/25/2017 0747   BILITOT 0.3 03/25/2017 0747   GFRNONAA 6 (L) 12/29/2016 2132   GFRAA 6 (L) 12/29/2016 2132     Recent Labs  07/11/16 0659 07/11/16 1134  10/15/16 1000 10/15/16 1014 12/29/16 1558 12/29/16 2132  NA 130* 131*  < > 134* 133* 132* 132*  K 4.6 3.7  < > 4.0 3.9 5.1 5.0  CL 91* 94*  < > 97* 98* 90* 90*  CO2 25 27  < > 23  --  21* 22  GLUCOSE 140* 193*  < > 199* 194* 110* 82  BUN 51* 11  < > 37* 37* 35* 38*  CREATININE 6.21* 2.54*  < > 4.35* 4.30* 6.40* 6.70*  CALCIUM 8.7* 8.8*  < > 9.6  --  9.1 9.0  PHOS 5.3* 2.2*  --   --   --   --  3.4  < > = values in this interval not  displayed. Liver Function Tests:  Recent Labs  07/07/16 1220  10/15/16 1000 12/29/16 2132 03/25/17 0747  AST 27  --  29  --  15  ALT 20  --  18  --  9  ALKPHOS 60  --  74  --  82  BILITOT 0.4  --  0.3  --  0.3  PROT 6.5  --  7.1  --  6.2  ALBUMIN 3.5  < > 4.0 3.7 4.1  < > = values in this interval not displayed.  Recent Labs  07/07/16 1220 10/15/16 1000  LIPASE 48 73*   No results for input(s): AMMONIA in the last 8760 hours. CBC:  Recent Labs  07/07/16 1220  08/26/16 1355 10/15/16 1014 12/29/16 1558 12/29/16 2132  WBC 7.0  < > 7.3  --  6.3 6.4  NEUTROABS 4.9  --  4.3  --   --   --   HGB 10.3*  <  > 10.8* 14.3 10.9* 10.8*  HCT 33.3*  < > 37.0 42.0 35.3* 35.0*  MCV 86.7  < > 97.4  --  92.7 91.9  PLT 313  < > 601*  --  362 377  < > = values in this interval not displayed. Lipid  Recent Labs  12/30/16 0342 03/25/17 0747  CHOL 95 146  HDL 24* 41  LDLCALC 27 77  TRIG 218* 140    Cardiac Enzymes:  Recent Labs  12/29/16 2132 12/30/16 0342 12/30/16 0833  TROPONINI 0.19* 0.18* 0.20*   BNP: No results for input(s): BNP in the last 8760 hours. No results found for: [facility] Lab Results  Component Value Date   HGBA1C 5.6 12/29/2016   Lab Results  Component Value Date   TSH 1.267 12/30/2016   No results found for: VITAMINB12 No results found for: FOLATE Lab Results  Component Value Date   IRON 53 08/17/2010   TIBC 359 08/17/2010    Imaging and Procedures obtained prior to SNF admission: Dg Lumbar Spine Complete  Result Date: 03/25/2017 CLINICAL DATA:  Fall. Generalized lower back pain. Initial encounter. EXAM: LUMBAR SPINE - COMPLETE 4+ VIEW COMPARISON:  07/30/2015 abdominal CT FINDINGS: No evidence of fracture or traumatic malalignment. Mild disc narrowing and borderline anterolisthesis at L4-5. Prominent aortic and iliac atherosclerotic calcification. IMPRESSION: No acute finding. Electronically Signed   By: Marnee Spring M.D.   On: 03/25/2017 15:39   Dg Knee 2 Views Left  Result Date: 03/25/2017 CLINICAL DATA:  Left knee pain after falling from a wheelchair today. EXAM: LEFT KNEE - 1-2 VIEW COMPARISON:  09/12/2016 FINDINGS: No evidence of fracture. Osteomyelitis affecting all 3 compartments. No measurable effusion. Regional arterial calcification. Question small intra-articular loose bodies. IMPRESSION: No acute or traumatic finding.  Osteoarthritis. Electronically Signed   By: Paulina Fusi M.D.   On: 03/25/2017 15:40   Dg Knee 2 Views Right  Result Date: 03/25/2017 CLINICAL DATA:  Pain.  Fell from wheelchair today. EXAM: RIGHT KNEE - 1-2 VIEW COMPARISON:   09/12/2016 FINDINGS: Tricompartmental osteoarthritis. Intraarticular loose bodies. Small joint effusion. No fracture. Regional arterial calcification. IMPRESSION: No acute or traumatic finding. Advanced tricompartmental osteoarthritis. Intraarticular loose bodies. Electronically Signed   By: Paulina Fusi M.D.   On: 03/25/2017 15:41   Dg Ankle Complete Left  Result Date: 03/25/2017 CLINICAL DATA:  Larey Seat from wheelchair.  Ankle pain. EXAM: LEFT ANKLE COMPLETE - 3+ VIEW COMPARISON:  09/12/2016 FINDINGS: No evidence of fracture or dislocation. Regional arterial calcification. IMPRESSION: Negative. Electronically Signed  By: Paulina Fusi M.D.   On: 03/25/2017 15:42   Ct Head Wo Contrast  Result Date: 03/25/2017 CLINICAL DATA:  Fall from wheelchair EXAM: CT HEAD WITHOUT CONTRAST CT CERVICAL SPINE WITHOUT CONTRAST TECHNIQUE: Multidetector CT imaging of the head and cervical spine was performed following the standard protocol without intravenous contrast. Multiplanar CT image reconstructions of the cervical spine were also generated. COMPARISON:  None. FINDINGS: CT HEAD FINDINGS Brain: Diffuse atrophic changes and chronic white matter ischemic changes are seen. No findings to suggest acute hemorrhage, acute infarction or space-occupying mass lesion is seen. Vascular: No hyperdense vessel or unexpected calcification. Skull: Normal. Negative for fracture or focal lesion. Sinuses/Orbits: No acute finding. Other: None. 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 C6-7 degenerative change is noted. No acute fracture or acute facet abnormality is noted. Soft tissues and spinal canal: A 2 cm hypodensity is noted within the left lobe of thyroid. Diffuse heterogeneity of the thyroid is noted. Carotid calcifications are noted. Disc levels: Disc space narrowing C6-7 secondary to central disc bulging and mild spurring. Upper  chest: Within normal limits. IMPRESSION: CT of the head: Chronic atrophic and ischemic changes without acute abnormality. CT of the cervical spine:  Degenerative changes are noted. 2 cm left thyroid nodule. This is stable from a prior CT examination dated 04/14/2016. The need for further followup can be determined on a clinical basis. Electronically Signed   By: Alcide Clever M.D.   On: 03/25/2017 15:57   Ct Cervical Spine Wo Contrast  Result Date: 03/25/2017 CLINICAL DATA:  Fall from wheelchair EXAM: CT HEAD WITHOUT CONTRAST CT CERVICAL SPINE WITHOUT CONTRAST TECHNIQUE: Multidetector CT imaging of the head and cervical spine was performed following the standard protocol without intravenous contrast. Multiplanar CT image reconstructions of the cervical spine were also generated. COMPARISON:  None. FINDINGS: CT HEAD FINDINGS Brain: Diffuse atrophic changes and chronic white matter ischemic changes are seen. No findings to suggest acute hemorrhage, acute infarction or space-occupying mass lesion is seen. Vascular: No hyperdense vessel or unexpected calcification. Skull: Normal. Negative for fracture or focal lesion. Sinuses/Orbits: No acute finding. Other: None. 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 C6-7 degenerative change is noted. No acute fracture or acute facet abnormality is noted. Soft tissues and spinal canal: A 2 cm hypodensity is noted within the left lobe of thyroid. Diffuse heterogeneity of the thyroid is noted. Carotid calcifications are noted. Disc levels: Disc space narrowing C6-7 secondary to central disc bulging and mild spurring. Upper chest: Within normal limits. IMPRESSION: CT of the head: Chronic atrophic and ischemic changes without acute abnormality. CT of the cervical spine:  Degenerative changes are noted. 2 cm left thyroid nodule. This is stable from a prior CT examination dated  04/14/2016. The need for further followup can be determined on a clinical basis. Electronically Signed   By: Alcide Clever M.D.   On: 03/25/2017 15:57     Not all labs, radiology exams or other studies done during hospitalization come through on my EPIC note; however they are reviewed by me.    Assessment and Plan  ACUTE ON CHRONIC HYPOXIC RESPIRATORY FAILURE/ACUTE ON CHRONIC DIASTOLIC congestive HEART FAILURE-cardiopulmonary status improved with hemodialysis and O2 requirements were able to be dropped to 2 L nasal cannula; echo with EF greater than 70%, left ventricular  hyperdynamic with moderate concentric LVH acute failure is resolved SNF - patient is admitted for generalized weakness for OT/PT; will continue hemodialysis Monday Wednesday Friday, labetalol 300 mg twice a day and losartan 100 mg daily patient is not on diuretics secondary to being on hemodialysis  MUSCULOSKELETAL CHEST WALL PAIN-stable chest discomfort was reproducible. Cardiology believed the pain was noncardiac. LHC on 06/2016 show mild to moderate nonobstructive coronary artery disease  ESRD SNF -continue Monday Wednesday Friday dialysis  ASPIRATION PNEUMONIA POSSIBLE-cardiopulmonary status improved significantly with hemodialysis; patient was treated with IV Zosyn started on 06/05/17 then switched to Augmentin for 3 days and treatment is felt to be complete  HYPERTENSION-elevated in the hospitalmedications were changed SNF-control is better; we'll monitor 3 times daily at skilled nursing facility; plan to continue Norvasc 10 mg by mouth daily clonidine 0.3 mg twice a day, which we could increase to 3 times a day, Imdur 15 mg daily, labetalol 300 mg twice a day, and losartan 100 mg daily at bedtime  RIGHT SHOULDER PAIN-x-ray showed degenerative arthritis SNF -patient may follow up with as an outpatient when necessary we'll continue Tylenol as needed  DM 2 SNF - not stated as uncontrolled; will continue Humalog sliding  scale insulin with meals  GERD SNF _ not stated as uncontrolled; continue Nexium 40 mg daily   Time spent greater than 45 minutes;> 50% of time with patient was spent reviewing records, labs, tests and studies, counseling and developing plan of care  Thurston Hole D. Lyn Hollingshead, MD

## 2017-06-14 DIAGNOSIS — R0789 Other chest pain: Secondary | ICD-10-CM | POA: Insufficient documentation

## 2017-06-14 DIAGNOSIS — K219 Gastro-esophageal reflux disease without esophagitis: Secondary | ICD-10-CM | POA: Insufficient documentation

## 2017-06-14 DIAGNOSIS — I5033 Acute on chronic diastolic (congestive) heart failure: Secondary | ICD-10-CM | POA: Insufficient documentation

## 2017-06-14 DIAGNOSIS — J690 Pneumonitis due to inhalation of food and vomit: Secondary | ICD-10-CM | POA: Insufficient documentation

## 2017-06-14 DIAGNOSIS — J9621 Acute and chronic respiratory failure with hypoxia: Secondary | ICD-10-CM | POA: Insufficient documentation

## 2017-06-14 DIAGNOSIS — M25511 Pain in right shoulder: Secondary | ICD-10-CM | POA: Insufficient documentation

## 2017-06-25 ENCOUNTER — Encounter: Payer: Self-pay | Admitting: Adult Health

## 2017-06-25 ENCOUNTER — Non-Acute Institutional Stay (SKILLED_NURSING_FACILITY): Payer: Medicare HMO | Admitting: Adult Health

## 2017-06-25 DIAGNOSIS — I5032 Chronic diastolic (congestive) heart failure: Secondary | ICD-10-CM

## 2017-06-25 DIAGNOSIS — Z992 Dependence on renal dialysis: Secondary | ICD-10-CM

## 2017-06-25 DIAGNOSIS — I110 Hypertensive heart disease with heart failure: Secondary | ICD-10-CM

## 2017-06-25 DIAGNOSIS — J9621 Acute and chronic respiratory failure with hypoxia: Secondary | ICD-10-CM

## 2017-06-25 DIAGNOSIS — N186 End stage renal disease: Secondary | ICD-10-CM

## 2017-06-25 DIAGNOSIS — J690 Pneumonitis due to inhalation of food and vomit: Secondary | ICD-10-CM

## 2017-06-25 NOTE — Progress Notes (Signed)
Location:   [facility] Room Number: 106 Place of Service:  SNF (31)    CODE STATUS: Full Code  Allergies  Allergen Reactions  . Iodinated Diagnostic Agents Hives and Itching  . Lactose Intolerance (Gi) Other (See Comments)    Abdominal pains and bloating    Chief Complaint  Patient presents with  . Discharge Note    Discharging to Home    HPI:  She had been hospitalized for chest pain with ACS ruled out; her hosptialization was complicated by accelerated  hypertension; acute on chronic respiratory failure. She was admitted to this facility for short term rehab. She is being discharged to home with home health for pt/ot/rn. She will need her prescriptions and will need to follow up with her medical provider. She will not need [facility] DME.     Past Medical History:  Diagnosis Date  . Abnormal nuclear stress test 07/10/2016  . Anemia    low iron  . Anemia in chronic renal disease 07/08/2016  . Arthritis   . CAD in native artery 08/12/2016   a. Nonobstructive ASCAD by cath with 60% D1 and 50% PL off RCA and 25% mid LAD.  . Diabetes mellitus    type 2  . Diabetic retinopathy (HCC)   . Dialysis patient [facility])    M-W-F @ [facility]  . Diastolic dysfunction   . Dyspnea on exertion 04/14/2016  . Ejection fraction   . Elevated troponin 10/16/2015  . ESRD on dialysis (HCC) 04/14/2016  . ESRD on hemodialysis [facility])    Dialysis T/Th/Sa  . GERD (gastroesophageal reflux disease)   . Glaucoma   . H/O: Bell's palsy 2007  . Hyperlipidemia   . Hypertension    a. labile - hx of BP dropping at HD.  . Macular degeneration   . Musculoskeletal chest pain   . PAD (peripheral artery disease) (HCC)    a. ABI 08/2016: mod RLE disease, normal L ABI.  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 PVD (peripheral vascular disease) (HCC) 03/18/2017  . Type 2 diabetes mellitus with renal complication (HCC) 09/26/2011    Past Surgical History:  Procedure Laterality Date    . ABDOMINAL HYSTERECTOMY    . ARTERIOVENOUS GRAFT PLACEMENT     Left forearm x 2, one removed in March  . ARTERIOVENOUS GRAFT PLACEMENT     Left forearm  . AVGG REMOVAL  10/08/2011   Procedure: REMOVAL OF ARTERIOVENOUS GORETEX GRAFT (AVGG);  Surgeon: Sherren Kerns, MD;  Location: [facility] OR;  Service: Vascular;  Laterality: Left;  . BTL    . CARDIAC CATHETERIZATION N/A 07/10/2016   Procedure: Left Heart Cath and Coronary Angiography;  Surgeon: Yvonne Kendall, MD;  Location: [facility] INVASIVE CV LAB;  Service: Cardiovascular;  Laterality: N/A;  . COLONOSCOPY    . EXCHANGE OF A DIALYSIS CATHETER Right 03/27/2016   Procedure: EXCHANGE OF A DIALYSIS CATHETER;  Surgeon: Fransisco Hertz, MD;  Location: [facility] OR;  Service: Vascular;  Laterality: Right;  . EYE SURGERY Left    Lasik surgery on left. Cataract surgery on both eyes  . LAPAROTOMY  ?2000   Pelvic mass (Benign)  . REVISION OF ARTERIOVENOUS GORETEX GRAFT Left 03/27/2016   Procedure: REVISION OF LEFT ARTERIOVENOUS GORETEX GRAFT;  Surgeon: Fransisco Hertz, MD;  Location: [facility] OR;  Service: Vascular;  Laterality: Left;  . TONSILLECTOMY      Social History   Social History  . Marital status: Widowed    Spouse name: N/A  .  Number of children: N/A  . Years of education: N/A   Occupational History  . retired Audiological scientistaccounting    Social History Main Topics  . Smoking status: Former Smoker    Types: Cigarettes    Quit date: 08/04/1999  . Smokeless tobacco: Never Used  . Alcohol use No  . Drug use: No  . Sexual activity: No   Other Topics Concern  . Not on file   Social History Narrative   [facility]/12/17   Widowed   Former smoker-stopped 2000   Alcohol none   Full Code   Family History  Problem Relation Age of Onset  . Diabetes Mother   . Heart disease Mother   . COPD Father   . Hypertension Father   . Lung disease Father   . Diabetes Father   . Diabetes Sister   . Breast cancer Sister     VITAL SIGNS BP (!)  100/57   Pulse 72   Temp (!) 97.3 F (36.3 C)   Resp 20   Ht 5\' 6"  (1.676 m)   Patient's Medications  New Prescriptions   No medications on file  Previous Medications   ACETAMINOPHEN (TYLENOL) 650 MG CR TABLET    Take 650 mg by mouth every 8 (eight) hours as needed.    AMLODIPINE (NORVASC) 10 MG TABLET    Take 10 mg by mouth at bedtime.    ASPIRIN EC 81 MG TABLET    Take 81 mg by mouth at bedtime.    B COMPLEX-C-FOLIC ACID (RENAL-VITE) 0.8 MG TABS    Take by mouth. Take one tablet every evening -Vitamin B complex-vitamin C folic acid   CALCIUM ACETATE (PHOSLO) 667 MG CAPSULE    Take 667-1,334 mg by mouth See admin instructions. Take 2 capsules (1334 mg) by mouth 3 times daily with meals and 1 capsule (667 mg) with snacks   CINACALCET (SENSIPAR) 60 MG TABLET    Take 60 mg by mouth daily. Monday, Wednesday and Friday   CLONIDINE (CATAPRES) 0.2 MG TABLET    Take 0.2 mg by mouth 3 (three) times daily.    CYCLOBENZAPRINE (FLEXERIL) 5 MG TABLET    Take 5 mg by mouth. Take one tablet three times a day as needed for muscle spasms   DIPHENHYDRAMINE (BENADRYL) 50 MG TABLET    Take 50 mg by mouth. Take one tablet twice daily as needed for allergies   ESOMEPRAZOLE (NEXIUM) 40 MG CAPSULE    Take 40 mg by mouth at bedtime.   ETHYL CHLORIDE SPRAY    Apply topically. For use at dialysis   GABAPENTIN (NEURONTIN) 100 MG CAPSULE    Take 1 capsule (100 mg total) by mouth 3 (three) times daily.   HYDRALAZINE (APRESOLINE) 50 MG TABLET    Take 50 mg by mouth. Take one tablet every 8 hours   HYDROCODONE-ACETAMINOPHEN (NORCO/VICODIN) 5-325 MG TABLET    Take 1 tablet by mouth. Take one tablet daily for pain   INSULIN ASPART (NOVOLOG) 100 UNIT/ML INJECTION    Inject into the skin. Inject three times a day before meals  70-120 = 0 units 121-150 =2 units 151-200 - 3 units 201-250 = 5 units 251-300 = 8 units 301-350 = 11 units 351-400 = 15 units Greater than 400 call provider and give 15 units   ISOSORBIDE  MONONITRATE (IMDUR) 30 MG 24 HR TABLET    Take 15 mg by mouth daily at 12 noon.   LABETALOL (NORMODYNE)  300 MG TABLET    Take 300 mg by mouth See admin instructions. Take 1 tablet (300 mg) by mouth twice daily - at noon and at bedtime   LOSARTAN (COZAAR) 100 MG TABLET    Take 100 mg by mouth at bedtime.    OXYGEN    Inhale into the lungs as needed (shortness of breath).   RANITIDINE (ZANTAC) 75 MG TABLET    Take 75 mg by mouth. Take one tablet nightly   SENNA PO    Take 2 tablets by mouth daily.   TRAVOPROST, BAK FREE, (TRAVATAN) 0.004 % SOLN OPHTHALMIC SOLUTION    Place 1 drop into both eyes at bedtime.  Modified Medications   No medications on file  Discontinued Medications   LIDOCAINE 4 % PTCH    Apply topically. Place one patch on the skin nightly     SIGNIFICANT DIAGNOSTIC EXAMS  NONE RECENT  LABS REVIEWED: TODAY:  06-11-17: wbc 8.90; hgb 9.2; hct 29; plt 392; glucose 274; bun 36; creat 3.8; na++ 135   Review of Systems  Constitutional: Negative for malaise/fatigue.  Respiratory: Negative for cough and shortness of breath.   Cardiovascular: Negative for chest pain, palpitations and leg swelling.  Gastrointestinal: Negative for abdominal pain, constipation and heartburn.  Musculoskeletal: Negative for back pain, joint pain and myalgias.  Skin: Negative.   Neurological: Negative for dizziness.  Psychiatric/Behavioral: The patient is not nervous/anxious.    Physical Exam  Constitutional: She is oriented to person, place, and time. She appears well-developed and well-nourished. No distress.  Eyes: Conjunctivae are normal.  Neck: Neck supple. No JVD present. No thyromegaly present.  Cardiovascular: Normal rate, regular rhythm and intact distal pulses.   Respiratory: Effort normal and breath sounds normal. No respiratory distress. She has no wheezes.  GI: Soft. Bowel sounds are normal. She exhibits no distension. There is no tenderness.  Musculoskeletal: She exhibits no edema.    Able to move all extremities   Lymphadenopathy:    She has no cervical adenopathy.  Neurological: She is alert and oriented to person, place, and time.  Skin: Skin is warm and dry. She is not diaphoretic.  A/v shunt: +thrill +bruit   Psychiatric: She has a normal mood and affect.    ASSESSMENT/ PLAN:  Patient is being discharged with the following home health services:  Pt/ot/rn: to evaluate and treat as indicated for gait balance strength adl training medication and wound management   Patient is being discharged with the following durable medical equipment:  None required   Patient has been advised to f/u with their PCP in 1-2 weeks to bring them up to date on their rehab stay.  Social services at facility was responsible for arranging this appointment.  Pt was provided with a 30 day supply of prescriptions for medications and refills must be obtained from their PCP.  For controlled substances, a more limited supply may be provided adequate until PCP appointment only. #30 vicodin 5/325 mg tabs  Time spent with discharge process: 40 minutes   Synthia Innocenteborah Micharl Helmes NP [facility]  Contact [phone] Monday through Friday 8am- 5pm  After hours call [phone]

## 2017-06-29 ENCOUNTER — Emergency Department (HOSPITAL_COMMUNITY): Payer: Medicare HMO

## 2017-06-29 ENCOUNTER — Emergency Department (HOSPITAL_COMMUNITY)
Admission: EM | Admit: 2017-06-29 | Discharge: 2017-07-01 | Disposition: A | Discharge status: Home / Self Care | Payer: Medicare HMO | Attending: Emergency Medicine | Admitting: Emergency Medicine

## 2017-06-29 ENCOUNTER — Encounter (HOSPITAL_COMMUNITY): Payer: Self-pay | Admitting: *Deleted

## 2017-06-29 DIAGNOSIS — R5381 Other malaise: Secondary | ICD-10-CM | POA: Insufficient documentation

## 2017-06-29 DIAGNOSIS — I509 Heart failure, unspecified: Secondary | ICD-10-CM | POA: Diagnosis not present

## 2017-06-29 DIAGNOSIS — Z79899 Other long term (current) drug therapy: Secondary | ICD-10-CM | POA: Diagnosis not present

## 2017-06-29 DIAGNOSIS — D649 Anemia, unspecified: Secondary | ICD-10-CM | POA: Diagnosis not present

## 2017-06-29 DIAGNOSIS — R0602 Shortness of breath: Secondary | ICD-10-CM | POA: Diagnosis present

## 2017-06-29 DIAGNOSIS — R2681 Unsteadiness on feet: Secondary | ICD-10-CM | POA: Diagnosis not present

## 2017-06-29 DIAGNOSIS — E875 Hyperkalemia: Secondary | ICD-10-CM | POA: Insufficient documentation

## 2017-06-29 DIAGNOSIS — Z992 Dependence on renal dialysis: Secondary | ICD-10-CM | POA: Diagnosis not present

## 2017-06-29 DIAGNOSIS — E785 Hyperlipidemia, unspecified: Secondary | ICD-10-CM | POA: Diagnosis not present

## 2017-06-29 DIAGNOSIS — I10 Essential (primary) hypertension: Secondary | ICD-10-CM

## 2017-06-29 DIAGNOSIS — Z794 Long term (current) use of insulin: Secondary | ICD-10-CM | POA: Insufficient documentation

## 2017-06-29 DIAGNOSIS — I2510 Atherosclerotic heart disease of native coronary artery without angina pectoris: Secondary | ICD-10-CM | POA: Insufficient documentation

## 2017-06-29 DIAGNOSIS — Z87891 Personal history of nicotine dependence: Secondary | ICD-10-CM | POA: Diagnosis not present

## 2017-06-29 DIAGNOSIS — E1122 Type 2 diabetes mellitus with diabetic chronic kidney disease: Secondary | ICD-10-CM | POA: Insufficient documentation

## 2017-06-29 DIAGNOSIS — N186 End stage renal disease: Secondary | ICD-10-CM | POA: Insufficient documentation

## 2017-06-29 DIAGNOSIS — I132 Hypertensive heart and chronic kidney disease with heart failure and with stage 5 chronic kidney disease, or end stage renal disease: Secondary | ICD-10-CM | POA: Insufficient documentation

## 2017-06-29 LAB — CBC WITH DIFFERENTIAL/PLATELET
BASOS ABS: 0 10*3/uL (ref 0.0–0.1)
Basophils Relative: 0 %
Eosinophils Absolute: 0.1 10*3/uL (ref 0.0–0.7)
Eosinophils Relative: 1 %
HEMATOCRIT: 29.9 % — AB (ref 36.0–46.0)
HEMOGLOBIN: 9.8 g/dL — AB (ref 12.0–15.0)
LYMPHS PCT: 27 %
Lymphs Abs: 2.1 10*3/uL (ref 0.7–4.0)
MCH: 28.3 pg (ref 26.0–34.0)
MCHC: 32.8 g/dL (ref 30.0–36.0)
MCV: 86.4 fL (ref 78.0–100.0)
MONO ABS: 0.4 10*3/uL (ref 0.1–1.0)
Monocytes Relative: 6 %
NEUTROS ABS: 5.1 10*3/uL (ref 1.7–7.7)
NEUTROS PCT: 66 %
Platelets: 447 10*3/uL — ABNORMAL HIGH (ref 150–400)
RBC: 3.46 MIL/uL — ABNORMAL LOW (ref 3.87–5.11)
RDW: 17.4 % — AB (ref 11.5–15.5)
WBC: 7.7 10*3/uL (ref 4.0–10.5)

## 2017-06-29 LAB — BASIC METABOLIC PANEL
Anion gap: 14 (ref 5–15)
BUN: 69 mg/dL — ABNORMAL HIGH (ref 6–20)
CHLORIDE: 90 mmol/L — AB (ref 101–111)
CO2: 27 mmol/L (ref 22–32)
Calcium: 9.8 mg/dL (ref 8.9–10.3)
Creatinine, Ser: 7.02 mg/dL — ABNORMAL HIGH (ref 0.44–1.00)
GFR calc non Af Amer: 5 mL/min — ABNORMAL LOW (ref >60)
GFR, EST AFRICAN AMERICAN: 6 mL/min — AB (ref >60)
Glucose, Bld: 189 mg/dL — ABNORMAL HIGH (ref 65–99)
POTASSIUM: 6 mmol/L — AB (ref 3.5–5.1)
Sodium: 131 mmol/L — ABNORMAL LOW (ref 135–145)

## 2017-06-29 LAB — CBG MONITORING, ED
GLUCOSE-CAPILLARY: 171 mg/dL — AB (ref 65–99)
Glucose-Capillary: 221 mg/dL — ABNORMAL HIGH (ref 65–99)

## 2017-06-29 MED ORDER — OXYCODONE HCL 5 MG PO TABS
5.0000 mg | ORAL_TABLET | ORAL | Status: AC
  Administered 2017-06-29: 5 mg via ORAL
  Filled 2017-06-29: qty 1

## 2017-06-29 MED ORDER — SODIUM POLYSTYRENE SULFONATE 15 GM/60ML PO SUSP
30.0000 g | Freq: Once | ORAL | Status: AC
  Administered 2017-06-29: 30 g via ORAL
  Filled 2017-06-29: qty 120

## 2017-06-29 MED ORDER — ACETAMINOPHEN 500 MG PO TABS
1000.0000 mg | ORAL_TABLET | Freq: Once | ORAL | Status: AC
  Administered 2017-06-29: 1000 mg via ORAL
  Filled 2017-06-29: qty 2

## 2017-06-29 MED ORDER — IBUPROFEN 800 MG PO TABS
800.0000 mg | ORAL_TABLET | Freq: Once | ORAL | Status: DC
  Filled 2017-06-29 (×2): qty 1

## 2017-06-29 NOTE — ED Triage Notes (Addendum)
To ED for eval of sob and htn. [facility] was [facility]- pt sent 10 of her 20 days there. Pt complains of pain 'all over' but is not new. Speaks in full complete sentences. Pt is due to dialysis tomorrow. VS as noted. Appears in nad. Pt's daughter states pt is not able to care for herself at home so she brought her here for readmit to rehab.

## 2017-06-29 NOTE — ED Provider Notes (Signed)
[facility]-EMERGENCY DEPT Provider Note   CSN: [id number] Arrival date & time: 06/29/17  1628     History   Chief Complaint Chief Complaint  Patient presents with  . Hypertension      HPI Rennie PlowmanDianne Siemon is a 71 y.o. Female with history Of ESRD on MWF HD, PAD, HTN, diastolic heart failure, CAD, DM who presents with family for concerns for needing readmission to rehab. Patient was admitted to [facility] 2 weeks ago for pneumonia, was discharged to rehab where she was supposed to stay for 20 days, however checked out after 10 days (discharged yesterday) . Home health nursing expressed to daughters concerns that patient needed additional rehabilitation, and was unsafe at home alone. Patient reports that she's had some nausea, vomiting and diarrhea for the past 24 hours, which has resolved. She does not have any current complaints.  Daughters at bedside with patient, expressed concern the patient was not ready for discharge home from rehabilitation. They're unable to stay with patient, and she lives alone. Patient usually uses a walker or cane to walk, however she has neither with her today. She is due for dialysis tomorrow. Past Medical History:  Diagnosis Date  . Abnormal nuclear stress test 07/10/2016  . Anemia    low iron  . Anemia in chronic renal disease 07/08/2016  . Arthritis   . CAD in native artery 08/12/2016   a. Nonobstructive ASCAD by cath with 60% D1 and 50% PL off RCA and 25% mid LAD.  . Diabetes mellitus    type 2  . Diabetic retinopathy (HCC)   . Dialysis patient [facility](HCC)    M-W-F @ [facility]  . Diastolic dysfunction   . Dyspnea on exertion 04/14/2016  . Ejection fraction   . Elevated troponin 10/16/2015  . ESRD on dialysis (HCC) 04/14/2016  . ESRD on hemodialysis [facility](HCC)    Dialysis T/Th/Sa  . GERD (gastroesophageal reflux disease)   . Glaucoma   . H/O: Bell's palsy 2007  . Hyperlipidemia   . Hypertension    a. labile - hx of BP dropping at HD.  . Macular  degeneration   . Musculoskeletal chest pain   . PAD (peripheral artery disease) (HCC)    a. ABI 08/2016: mod RLE disease, normal L ABI.  Marland Kitchen. 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 PVD (peripheral vascular disease) (HCC) 03/18/2017  . Type 2 diabetes mellitus with renal complication (HCC) 09/26/2011    Patient Active Problem List   Diagnosis Date Noted  . Acute on chronic respiratory failure with hypoxia (HCC) 06/14/2017  . Acute on chronic diastolic (congestive) heart failure (HCC) 06/14/2017  . Chest wall pain 06/14/2017  . Aspiration pneumonia (HCC) 06/14/2017  . Right shoulder pain 06/14/2017  . GERD (gastroesophageal reflux disease) 06/14/2017  . PVD (peripheral vascular disease) (HCC) 03/18/2017  . CAD in native artery 08/12/2016  . Abnormal nuclear stress test 07/10/2016  . Musculoskeletal chest pain   . Anemia in chronic renal disease 07/08/2016  . Dyspnea on exertion 04/14/2016  . ESRD on dialysis (HCC) 04/14/2016  . Hypertensive heart disease with congestive heart failure (HCC) 04/14/2016  . Hyperlipidemia 04/14/2016  . 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 Hypoglycemia 10/16/2015  . Elevated troponin 10/16/2015  . Ejection fraction   . Type 2 diabetes mellitus with renal complication (HCC) 09/26/2011  . Other complications due to renal dialysis device, implant, and graft 09/26/2011    Past Surgical History:  Procedure Laterality Date  . ABDOMINAL HYSTERECTOMY    .  ARTERIOVENOUS GRAFT PLACEMENT     Left forearm x 2, one removed in March  . ARTERIOVENOUS GRAFT PLACEMENT     Left forearm  . AVGG REMOVAL  10/08/2011   Procedure: REMOVAL OF ARTERIOVENOUS GORETEX GRAFT (AVGG);  Surgeon: Sherren Kerns, MD;  Location: [facility] OR;  Service: Vascular;  Laterality: Left;  . BTL    . CARDIAC CATHETERIZATION N/A 07/10/2016   Procedure: Left Heart Cath and Coronary Angiography;  Surgeon: Yvonne Kendall, MD;  Location: [facility] INVASIVE CV LAB;   Service: Cardiovascular;  Laterality: N/A;  . COLONOSCOPY    . EXCHANGE OF A DIALYSIS CATHETER Right 03/27/2016   Procedure: EXCHANGE OF A DIALYSIS CATHETER;  Surgeon: Fransisco Hertz, MD;  Location: [facility] OR;  Service: Vascular;  Laterality: Right;  . EYE SURGERY Left    Lasik surgery on left. Cataract surgery on both eyes  . LAPAROTOMY  ?2000   Pelvic mass (Benign)  . REVISION OF ARTERIOVENOUS GORETEX GRAFT Left 03/27/2016   Procedure: REVISION OF LEFT ARTERIOVENOUS GORETEX GRAFT;  Surgeon: Fransisco Hertz, MD;  Location: [facility] OR;  Service: Vascular;  Laterality: Left;  . TONSILLECTOMY      OB History    No data available       Home Medications    Prior to Admission medications   Medication Sig Start Date End Date Taking? Authorizing Provider  acetaminophen (TYLENOL) 650 MG CR tablet Take 650 mg by mouth every 8 (eight) hours as needed for pain.    Yes [provider]  amLODipine (NORVASC) 10 MG tablet Take 10 mg by mouth at bedtime.    Yes [provider]  aspirin EC 81 MG tablet Take 81 mg by mouth at bedtime.    Yes [provider]  B Complex-C-Folic Acid (RENAL-VITE) 0.8 MG TABS Take 1 tablet by mouth every evening. Vitamin B complex-vitamin C folic acid   Yes [provider]  calcium acetate (PHOSLO) 667 MG capsule Take 667-1,334 mg by mouth See admin instructions. Take 2 capsules (1334 mg) by mouth 3 times daily with meals and 1 capsule (667 mg) with snacks   Yes [provider]  cinacalcet (SENSIPAR) 60 MG tablet Take 60 mg by mouth daily. Monday, Wednesday and Friday   Yes [provider]  cloNIDine (CATAPRES) 0.2 MG tablet Take 0.2 mg by mouth 3 (three) times daily.    Yes [provider]  cyclobenzaprine (FLEXERIL) 5 MG tablet Take 5 mg by mouth 3 (three) times daily as needed for muscle spasms.    Yes [provider]  diphenhydrAMINE (BENADRYL) 50 MG tablet Take 50 mg by mouth 2 (two) times daily as needed for  allergies.    Yes [provider]  esomeprazole (NEXIUM) 40 MG capsule Take 40 mg by mouth at bedtime. 03/11/17  Yes [provider]  ethyl chloride spray Apply 1 application topically daily as needed (dialysis). For use at dialysis    Yes [provider]  gabapentin (NEURONTIN) 100 MG capsule Take 1 capsule (100 mg total) by mouth 3 (three) times daily. 12/30/16  Yes Marlin Canary U, DO  hydrALAZINE (APRESOLINE) 50 MG tablet Take 50 mg by mouth every 8 (eight) hours. Take one tablet every 8 hours 06/12/17 07/12/17 Yes [provider]  HYDROcodone-acetaminophen (NORCO/VICODIN) 5-325 MG tablet Take 1 tablet by mouth daily as needed for moderate pain.  09/19/16  Yes [provider]  insulin aspart (NOVOLOG) 100 UNIT/ML injection Inject 0-15 Units into the skin 3 (  three) times daily with meals. Inject three times a day before meals  70-120 = 0 units 121-150 =2 units 151-200 - 3 units 201-250 = 5 units 251-300 = 8 units 301-350 = 11 units 351-400 = 15 units Greater than 400 call provider and give 15 units   Yes [provider]  isosorbide mononitrate (IMDUR) 30 MG 24 hr tablet Take 15 mg by mouth daily at 12 noon.   Yes [provider]  labetalol (NORMODYNE) 300 MG tablet Take 300 mg by mouth See admin instructions. Take 1 tablet (300 mg) by mouth twice daily - at noon and at bedtime 10/01/16  Yes [provider]  losartan (COZAAR) 100 MG tablet Take 100 mg by mouth at bedtime.    Yes [provider]  OXYGEN Inhale into the lungs as needed (shortness of breath).   Yes [provider]  ranitidine (ZANTAC) 75 MG tablet Take 75 mg by mouth every evening.    Yes [provider]  SENNA PO Take 2 tablets by mouth daily.   Yes [provider]  Travoprost, BAK Free, (TRAVATAN) 0.004 % SOLN ophthalmic solution Place 1 drop into both eyes at bedtime.   Yes [provider]    Family History Family  History  Problem Relation Age of Onset  . Diabetes Mother   . Heart disease Mother   . COPD Father   . Hypertension Father   . Lung disease Father   . Diabetes Father   . Diabetes Sister   . Breast cancer Sister     Social History Social History  Substance Use Topics  . Smoking status: Former Smoker    Types: Cigarettes    Quit date: 08/04/1999  . Smokeless tobacco: Never Used  . Alcohol use No     Allergies   Iodinated diagnostic agents and Lactose intolerance (gi)   Review of Systems Review of Systems  Constitutional: Negative for chills and fever.  HENT: Negative for ear pain and sore throat.   Eyes: Negative for pain and visual disturbance.  Respiratory: Negative for cough and shortness of breath.   Cardiovascular: Negative for chest pain and palpitations.  Gastrointestinal: Positive for diarrhea, nausea and vomiting. Negative for abdominal pain.  Genitourinary: Negative for dysuria and hematuria.  Musculoskeletal: Negative for arthralgias and back pain.  Skin: Negative for color change and rash.  Neurological: Negative for seizures and syncope.  All other systems reviewed and are negative.    Physical Exam Updated Vital Signs BP (!) 188/93   Pulse 74   Temp 98.8 F (37.1 C) (Oral)   Resp 16   SpO2 99%   Physical Exam  Constitutional: She is oriented to person, place, and time. She appears well-developed and well-nourished. No distress.  HENT:  Head: Normocephalic and atraumatic.  Eyes: Conjunctivae are normal.  Neck: Neck supple.  Cardiovascular: Normal rate and regular rhythm.   No murmur heard. Pulmonary/Chest: Effort normal and breath sounds normal. No respiratory distress.  Abdominal: Soft. There is no tenderness.  Musculoskeletal: She exhibits no edema.  Neurological: She is alert and oriented to person, place, and time. No cranial nerve deficit. She exhibits normal muscle tone. Coordination normal.  Unsteady upon standing, unable to assess gait    Skin: Skin is warm and dry.  Psychiatric: She has a normal mood and affect.  Nursing note and vitals reviewed.    ED Treatments / Results  Labs (all labs ordered are listed, but only abnormal results are displayed) Labs  Reviewed  CBC WITH DIFFERENTIAL/PLATELET - Abnormal; Notable for the following:       Result Value   RBC 3.46 (*)    Hemoglobin 9.8 (*)    HCT 29.9 (*)    RDW 17.4 (*)    Platelets 447 (*)    All other components within normal limits  BASIC METABOLIC PANEL - Abnormal; Notable for the following:    Sodium 131 (*)    Potassium 6.0 (*)    Chloride 90 (*)    Glucose, Bld 189 (*)    BUN 69 (*)    Creatinine, Ser 7.02 (*)    GFR calc non Af Amer 5 (*)    GFR calc Af Amer 6 (*)    All other components within normal limits  CBG MONITORING, ED - Abnormal; Notable for the following:    Glucose-Capillary 221 (*)    All other components within normal limits  CBG MONITORING, ED - Abnormal; Notable for the following:    Glucose-Capillary 171 (*)    All other components within normal limits    EKG  EKG Interpretation  Date/Time:  Sunday June 29 2017 23:51:36 EDT Ventricular Rate:  74 PR Interval:    QRS Duration: 82 QT Interval:  412 QTC Calculation: 458 R Axis:   -40 Text Interpretation:  Sinus rhythm Inferior infarct, old Consider anterior infarct No significant change since last tracing Confirmed by Gilda Crease [phone]) on 06/29/2017 11:55:19 PM       Radiology Dg Chest 2 View  Result Date: 06/29/2017 CLINICAL DATA:  Cough, runny nose, nausea, vomiting and diarrhea today. EXAM: CHEST  2 VIEW COMPARISON:  06/09/2017 FINDINGS: Clearing of right upper lobe airspace disease and right effusion since prior. No new pulmonary consolidation or evidence of CHF. Stable cardiomegaly with aortic atherosclerosis. Subsegmental atelectasis and/or scarring in the left mid lung subcortical cystic change noted both humeral heads. No acute osseous appearing  abnormality. IMPRESSION: No active cardiopulmonary disease. Stable cardiomegaly with aortic atherosclerosis. Electronically Signed   By: Tollie Eth M.D.   On: 06/29/2017 21:23   Dg Abdomen 1 View  Result Date: 06/29/2017 CLINICAL DATA:  Left lower abdominal pain for 3 days, nausea and vomiting today. EXAM: ABDOMEN - 1 VIEW COMPARISON:  None. FINDINGS: Bowel gas pattern is nonobstructive. Moderate amount of stool throughout the nondistended colon. No evidence of soft tissue mass or abnormal fluid collection. No evidence of free intraperitoneal air. Aortoiliac atherosclerosis. Surgical changes about the left inguinal region. No acute or suspicious osseous finding. IMPRESSION: No acute findings.  Nonobstructive bowel gas pattern. Aortic atherosclerosis. Electronically Signed   By: Bary Richard M.D.   On: 06/29/2017 18:22    Procedures Procedures (including critical care time)  Medications Ordered in ED Medications  ibuprofen (ADVIL,MOTRIN) tablet 800 mg (800 mg Oral Refused 06/29/17 2139)  amLODipine (NORVASC) tablet 10 mg (not administered)  aspirin chewable tablet 81 mg (not administered)  cloNIDine (CATAPRES) tablet 0.2 mg (not administered)  acetaminophen (TYLENOL) tablet 650 mg (not administered)  acetaminophen (TYLENOL) tablet 1,000 mg (1,000 mg Oral Given 06/29/17 1918)  sodium polystyrene (KAYEXALATE) 15 GM/60ML suspension 30 g (30 g Oral Given 06/29/17 2116)  oxyCODONE (Oxy IR/ROXICODONE) immediate release tablet 5 mg (5 mg Oral Given 06/29/17 2200)     Initial Impression / Assessment and Plan / ED Course  I have reviewed the triage vital signs and the nursing notes.  Pertinent labs & imaging results that were available during my care of the patient were  reviewed by me and considered in my medical decision making (see chart for details).     Patient is a 71 year old female with history of end-stage renal disease on HD discharged from rehabilitation yesterday after hospital admission for  pneumonia, who presents with family that are considered patient is not safe at home alone given her multiple medical conditions, and unsteady gait. Patient arrived clinically stable and in no acute distress. Exam as above.   Potassium elevated to 6.0. No changes on EKG - no need for urgent HD at this time. Patient given Kayexalate. Otherwise basic labs, chest x-ray and EKG all stable and without acute findings. Discussed with hospitalist, patient does not have any acute needs for hospitalization at this time. I am concerned that patient is not safe for discharge home as she lives alone and with her mobility difficulties, she would benefit from additional rehab. Unclear why she was released early, although I suspect patient pushed for discharge. According to daughters, they have attempted placement several times in past but patient has refused.   Plan for patient to remain in ED until AM when she can be evaluated by Case management and social work to aide in placement. Pending Nephrology consultation for AM HD. Patient and her family members in agreement with plan at time of transfer of care to Dr. Blinda LeatherwoodPollina. Please see his note for final patient disposition.  Patient and plan of care discussed with Attending physician, Dr. Hyacinth MeekerMiller.    Final Clinical Impressions(s) / ED Diagnoses   Final diagnoses:  Hypertension, unspecified type  Physical deconditioning  Hyperkalemia    New Prescriptions New Prescriptions   No medications on file         Wynelle ClevelandMizera, Briget Shaheed, MD 06/30/17 [phone]    Eber HongMiller, Brian, MD 07/02/17 [phone]

## 2017-06-29 NOTE — ED Provider Notes (Signed)
I saw and evaluated the patient, reviewed the resident's note and I agree with the findings and plan.  Pertinent History:  The patient is a 71 year old female with a known history of hypertension as well as a history of end-stage renal disease on dialysis Monday Wednesday and Friday. She was recently seen at [facility] where she was admitted to the hospital for a possible aspiration pneumonia after which time she was discharged to a rehabilitation facility where she spent 10 days. During that timeframe she did improve and was finally discharged yesterday. Her family doctor arranged for a home health nurse to come out to the house to evaluate and assess her home situation and because the patient had had some vomiting and diarrhea overnight she recommended that she come to the hospital for evaluation and possible replacement and a rehabilitation facility. The patient does not want to go into a long-term care facility. The family members that are with the patient reports that she has had increased amounts of shaking and difficulty ambulation compared to baseline though she has only been out of the rehabilitation for approximately 24 hours.  On exam the patient has a soft abdomen, clear heart and lung sounds without wheezing rhonchi or rales, she appears perfectly comfortable with no signs of distress. She does have some mild to moderate hypertension but this is expected given her lack of dialysis. She is supposed to dialyze in the morning.  X-ray will be obtained to evaluate for resolution of pneumonia, labs are reassuring except for some hyperkalemia which at this time can be treated with Kayexalate as she is supposed to dialyze within 12 hours.  The patient will need to ambulate to show that she has good ambulation and should be safe at home.  The patient has been unable to ambulate, there is no findings on medical workup that necessitates admission, the potassium is 6.0 and she will need  to have dialysis in the morning. Nephrology will be counseled by the resident. Case management and social work will need to be on the case in the morning.   EKG Interpretation  Date/Time:  Sunday June 29 2017 23:51:36 EDT Ventricular Rate:  74 PR Interval:    QRS Duration: 82 QT Interval:  412 QTC Calculation: 458 R Axis:   -40 Text Interpretation:  Sinus rhythm Inferior infarct, old Consider anterior infarct No significant change since last tracing Confirmed by Gilda CreasePollina, Christopher J [phone]) on 06/29/2017 11:55:19 PM       I personally interpreted the EKG as well as the resident and agree with the interpretation on the resident's chart.  Final diagnoses:  Hypertension, unspecified type  Physical deconditioning  Hyperkalemia      Eber HongMiller, Ehtan Delfavero, MD 07/02/17 [phone]

## 2017-06-30 LAB — CBG MONITORING, ED
GLUCOSE-CAPILLARY: 331 mg/dL — AB (ref 65–99)
Glucose-Capillary: 274 mg/dL — ABNORMAL HIGH (ref 65–99)

## 2017-06-30 MED ORDER — LIDOCAINE-PRILOCAINE 2.5-2.5 % EX CREA: 1.0000 "application " | TOPICAL_CREAM | CUTANEOUS | Status: DC | PRN

## 2017-06-30 MED ORDER — AMLODIPINE BESYLATE 5 MG PO TABS
10.0000 mg | ORAL_TABLET | Freq: Once | ORAL | Status: AC
  Administered 2017-06-30: 10 mg via ORAL
  Filled 2017-06-30: qty 2

## 2017-06-30 MED ORDER — CLONIDINE HCL 0.2 MG PO TABS
0.2000 mg | ORAL_TABLET | Freq: Once | ORAL | Status: AC
  Administered 2017-06-30: 0.2 mg via ORAL
  Filled 2017-06-30: qty 1

## 2017-06-30 MED ORDER — ASPIRIN 81 MG PO CHEW
81.0000 mg | CHEWABLE_TABLET | Freq: Once | ORAL | Status: AC
  Administered 2017-06-30: 81 mg via ORAL
  Filled 2017-06-30: qty 1

## 2017-06-30 MED ORDER — PENTAFLUOROPROP-TETRAFLUOROETH EX AERO: 1.0000 "application " | INHALATION_SPRAY | CUTANEOUS | Status: DC | PRN

## 2017-06-30 MED ORDER — SODIUM CHLORIDE 0.9 % IV SOLN: 100.0000 mL | INTRAVENOUS | Status: DC | PRN

## 2017-06-30 MED ORDER — HEPARIN SODIUM (PORCINE) 1000 UNIT/ML DIALYSIS: 1000.0000 [IU] | INTRAMUSCULAR | Status: DC | PRN

## 2017-06-30 MED ORDER — HYDROMORPHONE HCL 1 MG/ML IJ SOLN
1.0000 mg | Freq: Once | INTRAMUSCULAR | Status: AC
  Administered 2017-06-30: 1 mg via INTRAVENOUS
  Filled 2017-06-30: qty 1

## 2017-06-30 MED ORDER — ONDANSETRON HCL 4 MG PO TABS: 4.0000 mg | ORAL_TABLET | Freq: Three times a day (TID) | ORAL | 0 refills | Status: AC | PRN

## 2017-06-30 MED ORDER — ACETAMINOPHEN 325 MG PO TABS
650.0000 mg | ORAL_TABLET | Freq: Once | ORAL | Status: AC
  Administered 2017-06-30: 650 mg via ORAL
  Filled 2017-06-30: qty 2

## 2017-06-30 MED ORDER — HEPARIN SODIUM (PORCINE) 1000 UNIT/ML DIALYSIS: 20.0000 [IU]/kg | INTRAMUSCULAR | Status: DC | PRN

## 2017-06-30 MED ORDER — HYDROCODONE-ACETAMINOPHEN 5-325 MG PO TABS
1.0000 | ORAL_TABLET | Freq: Once | ORAL | Status: AC
  Administered 2017-06-30: 1 via ORAL
  Filled 2017-06-30: qty 1

## 2017-06-30 MED ORDER — LIDOCAINE HCL (PF) 1 % IJ SOLN: 5.0000 mL | INTRAMUSCULAR | Status: DC | PRN

## 2017-06-30 MED ORDER — HYDROCODONE-ACETAMINOPHEN 5-325 MG PO TABS: 1.0000 | ORAL_TABLET | ORAL | 0 refills | Status: DC | PRN

## 2017-06-30 NOTE — ED Notes (Signed)
ED Provider at bedside. 

## 2017-06-30 NOTE — ED Notes (Signed)
Pt arrives back to ED from dialysis. Pt a/o, resp e/u, nad.

## 2017-06-30 NOTE — Progress Notes (Signed)
CM RN and CSW spoke with pt's daughter Colleen Mullins(Colleen Mullins) in the office to discuss options for placement. Pt's daughter was notified that there are little to no option for placement for pt at this time. Pt doe snot meet criteria that daughter is seeking placement for and has  been notified that family and pt will have to self pay for a facility at this time due to insurance coverage. Pt's daughter was given additional HH services and was agreeable to take pt home with her. Pt will be discharged to daughters home once medically stable for discharge.   Claude MangesKierra S. Jazlynn Nemetz, MSW, LCSW-A Emergency Department Clinical Social Worker [phone]

## 2017-06-30 NOTE — Care Management Note (Addendum)
Case Management Note  Patient Details  Name: Rennie PlowmanDianne Casagrande MRN: [id number] Date of Birth: 06/05/1946  Subjective/Objective:                  71 year old female with hx of HTN as well as a ESRD on dialysis MWF. She was recently seen at [facility] where she was admitted for a possible aspiration pneumonia after which time she was discharged to a rehabilitation facility ([facility]) where she spent 10 days. During that timeframe she did improve and was finally discharged yesterday. Her family doctor arranged for a home health nurse ([facility]) to come out to the house to evaluate and assess her home situation and because the patient had had some vomiting and diarrhea overnight she recommended that she come to the hospital for evaluation and possible replacement and a rehabilitation facility.  Action/Plan: Home with Home Health vs SNF  Expected Discharge Date:   (unknown)               Expected Discharge Plan:  Skilled Nursing Facility  In-House Referral:  Clinical Social Work  Discharge planning Services  CM Consult  Post Acute Care Choice:    Choice offered to:     DME Arranged:    DME Agency:     HH Arranged:    HH Agency:     Status of Service:  In process, will continue to follow  If discussed at Long Length of Stay Meetings, dates discussed:    Additional Comments:  Oletta CohnWood, Sinjin Amero, RN 06/30/2017, 8:03 AM

## 2017-06-30 NOTE — Care Management (Signed)
ED CM and ED CSW spoke with patient's daughter Colleen Mullins regarding [facility]. Patient is not eligible for placement according to CSW patient was recently [facility] and discharged after meeting goals. Once patient was discharged she lost her residence. CM explained to daughter that patient would need to private pay for placement. Daughter states she could not afford to do so. Daughter was holding  to the fact that she could take mother to her residence. But after a long discussion and explaining that she would be discharged after dialysis. Daughter made decision to take patient home with home health after HD. CM explained that services would be increased she was agreeable. Updated Dr. Rush Landmarkegeler orders obtained  and referral [facility] to [facility]. Patient is still  in HD.

## 2017-06-30 NOTE — Discharge Instructions (Signed)
Please speak with the home health coordinator to help determine your level of care needs and further management location. Please use the pain and nausea medicine to help with her symptoms. If anything changes or worsens, please return to the nearest emergency department. Thank you for your patience today.

## 2017-06-30 NOTE — Progress Notes (Addendum)
CSW and CM RN spoke with pt and pt's daughter about further needs regarding care. After speaking with pt and daughter, CSW spoke with Lowella B[facility]Nikki at [facility] to see if they would be able to take pt back if needed. Nikki informed CSW that they could take pt back howeve r pt would have to pay out of pocket as a result of insurance already being used prior. Nikki informed CSW that she had spoken with pt's daughter about this already and informed her that pt's insurance wouldn't cover ALF services either. CSW and CM RN spoke with pt and daughter and was informed that pt has another insurance source [facility](Medicaid) that they would like to use. Pt's daughter is awrae that PT is reccommeneding SNF for pt at the time of discharge, however due to insurance barriers pt's daughter is hoping for ALF placement. CSW has been working to find ALF placement as pt's daughter request, however no responses yet.    Colleen Mullins, MSW, LCSW-A Emergency Department Clinical Social Worker [phone]

## 2017-06-30 NOTE — Consult Note (Signed)
[facility] Renal Consultation Note    Indication for Consultation:  Management of ESRD/hemodialysis, anemia, hypertension/volume, and secondary hyperparathyroidism. PCP:  HPI: Mariene Dickerman is a 71 y.o. female with ESRD, CAD, HTN, Type 2 DM, PVD who is being admitted for weakness, need for placement.  S/p 10 day stay at [facility] rehab after pneumonia admission. She was discharged to home yesterday, but then brought to ED as daughter feels that she is unsafe to return home. Her only symptoms today are malaise/weakness. She denies any CP, dyspnea, abdominal pain, N/V, diarrhea or fever. C/o chest and abdominal wall soreness related to prior fall from her wheelchair and subq insulin injections. Unclear if she can perform ADLs at this time.  In ED, found to have K 6. She is due for dialysis today (usually MWF schedule); last dialyzed 8/3. SW/case managers working on rehab/SNF placement, plan unclear at this time.  Past Medical History:  Diagnosis Date  . Abnormal nuclear stress test 07/10/2016  . Anemia    low iron  . Anemia in chronic renal disease 07/08/2016  . Arthritis   . CAD in native artery 08/12/2016   a. Nonobstructive ASCAD by cath with 60% D1 and 50% PL off RCA and 25% mid LAD.  . Diabetes mellitus    type 2  . Diabetic retinopathy (HCC)   . Dialysis patient [facility])    M-W-F @ [facility]  . Diastolic dysfunction   . Dyspnea on exertion 04/14/2016  . Ejection fraction   . Elevated troponin 10/16/2015  . ESRD on dialysis (HCC) 04/14/2016  . ESRD on hemodialysis [facility])    Dialysis T/Th/Sa  . GERD (gastroesophageal reflux disease)   . Glaucoma   . H/O: Bell's palsy 2007  . Hyperlipidemia   . Hypertension    a. labile - hx of BP dropping at HD.  . Macular degeneration   . Musculoskeletal chest pain   . PAD (peripheral artery disease) (HCC)    a. ABI 08/2016: mod RLE disease, normal L ABI.  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 PVD  (peripheral vascular disease) (HCC) 03/18/2017  . Type 2 diabetes mellitus with renal complication (HCC) 09/26/2011   Past Surgical History:  Procedure Laterality Date  . ABDOMINAL HYSTERECTOMY    . ARTERIOVENOUS GRAFT PLACEMENT     Left forearm x 2, one removed in March  . ARTERIOVENOUS GRAFT PLACEMENT     Left forearm  . AVGG REMOVAL  10/08/2011   Procedure: REMOVAL OF ARTERIOVENOUS GORETEX GRAFT (AVGG);  Surgeon: Sherren Kerns, MD;  Location: [facility] OR;  Service: Vascular;  Laterality: Left;  . BTL    . CARDIAC CATHETERIZATION N/A 07/10/2016   Procedure: Left Heart Cath and Coronary Angiography;  Surgeon: Yvonne Kendall, MD;  Location: [facility] INVASIVE CV LAB;  Service: Cardiovascular;  Laterality: N/A;  . COLONOSCOPY    . EXCHANGE OF A DIALYSIS CATHETER Right 03/27/2016   Procedure: EXCHANGE OF A DIALYSIS CATHETER;  Surgeon: Fransisco Hertz, MD;  Location: [facility] OR;  Service: Vascular;  Laterality: Right;  . EYE SURGERY Left    Lasik surgery on left. Cataract surgery on both eyes  . LAPAROTOMY  ?2000   Pelvic mass (Benign)  . REVISION OF ARTERIOVENOUS GORETEX GRAFT Left 03/27/2016   Procedure: REVISION OF LEFT ARTERIOVENOUS GORETEX GRAFT;  Surgeon: Fransisco Hertz, MD;  Location: [facility] OR;  Service: Vascular;  Laterality: Left;  . TONSILLECTOMY     Family History  Problem Relation Age of Onset  .  Diabetes Mother   . Heart disease Mother   . COPD Father   . Hypertension Father   . Lung disease Father   . Diabetes Father   . Diabetes Sister   . Breast cancer Sister    Social History:  reports that she quit smoking about 17 years ago. Her smoking use included Cigarettes. She has never used smokeless tobacco. She reports that she does not drink alcohol or use drugs.  ROS: As per HPI otherwise negative.  Physical Exam: Vitals:   06/30/17 1200 06/30/17 1215 06/30/17 1230 06/30/17 1245  BP:   (!) 173/66   Pulse: 71 72 76 75  Resp:      Temp:      TempSrc:      SpO2: 99% 100% 95% 99%      General: Well developed, well nourished, in no acute distress. + Fine tremor present. Head: Normocephalic, atraumatic, sclera non-icteric, mucus membranes are moist. Neck: Supple without lymphadenopathy/masses. JVD not elevated. Lungs: Clear bilaterally to auscultation anteriorly; no wheezing. Heart: RRR with normal S1, S2. No murmurs, rubs, or gallops appreciated. Abdomen: Soft, non-tender (side from area with recent insulin injection), non-distended. Musculoskeletal:  Strength/muscle tone lower than expected. Lower extremities: No edema or ischemic changes, no open wounds. Neuro: Alert and oriented X 3. Moves all extremities spontaneously. Psych:  Responds to questions appropriately with a normal affect. Dialysis Access: R AVG + thrill  Allergies  Allergen Reactions  . Iodinated Diagnostic Agents Hives and Itching  . Lactose Intolerance (Gi) Other (See Comments)    Abdominal pains and bloating   Prior to Admission medications   Medication Sig Start Date End Date Taking? Authorizing Provider  acetaminophen (TYLENOL) 650 MG CR tablet Take 650 mg by mouth every 8 (eight) hours as needed for pain.    Yes [provider]  amLODipine (NORVASC) 10 MG tablet Take 10 mg by mouth at bedtime.    Yes [provider]  aspirin EC 81 MG tablet Take 81 mg by mouth at bedtime.    Yes [provider]  B Complex-C-Folic Acid (RENAL-VITE) 0.8 MG TABS Take 1 tablet by mouth every evening. Vitamin B complex-vitamin C folic acid   Yes [provider]  calcium acetate (PHOSLO) 667 MG capsule Take 667-1,334 mg by mouth See admin instructions. Take 2 capsules (1334 mg) by mouth 3 times daily with meals and 1 capsule (667 mg) with snacks   Yes [provider]  cinacalcet (SENSIPAR) 60 MG tablet Take 60 mg by mouth daily. Monday, Wednesday and Friday   Yes [provider]  cloNIDine (CATAPRES) 0.2 MG tablet Take 0.2 mg by mouth 3 (three) times daily.    Yes  [provider]  cyclobenzaprine (FLEXERIL) 5 MG tablet Take 5 mg by mouth 3 (three) times daily as needed for muscle spasms.    Yes [provider]  diphenhydrAMINE (BENADRYL) 50 MG tablet Take 50 mg by mouth 2 (two) times daily as needed for allergies.    Yes [provider]  esomeprazole (NEXIUM) 40 MG capsule Take 40 mg by mouth at bedtime. 03/11/17  Yes [provider]  ethyl chloride spray Apply 1 application topically daily as needed (dialysis). For use at dialysis    Yes [provider]  gabapentin (NEURONTIN) 100 MG capsule Take 1 capsule (100 mg total) by mouth 3 (three) times daily. 12/30/16  Yes Marlin Canary U, DO  hydrALAZINE (APRESOLINE) 50 MG tablet Take 50 mg by mouth every 8 (  eight) hours. Take one tablet every 8 hours 06/12/17 07/12/17 Yes [provider]  HYDROcodone-acetaminophen (NORCO/VICODIN) 5-325 MG tablet Take 1 tablet by mouth daily as needed for moderate pain.  09/19/16  Yes [provider]  insulin aspart (NOVOLOG) 100 UNIT/ML injection Inject 0-15 Units into the skin 3 (three) times daily with meals. Inject three times a day before meals  70-120 = 0 units 121-150 =2 units 151-200 - 3 units 201-250 = 5 units 251-300 = 8 units 301-350 = 11 units 351-400 = 15 units Greater than 400 call provider and give 15 units   Yes [provider]  isosorbide mononitrate (IMDUR) 30 MG 24 hr tablet Take 15 mg by mouth daily at 12 noon.   Yes [provider]  labetalol (NORMODYNE) 300 MG tablet Take 300 mg by mouth See admin instructions. Take 1 tablet (300 mg) by mouth twice daily - at noon and at bedtime 10/01/16  Yes [provider]  losartan (COZAAR) 100 MG tablet Take 100 mg by mouth at bedtime.    Yes [provider]  OXYGEN Inhale into the lungs as needed (shortness of breath).   Yes [provider]  ranitidine (ZANTAC) 75 MG tablet Take 75 mg by mouth every evening.    Yes  [provider]  SENNA PO Take 2 tablets by mouth daily.   Yes [provider]  Travoprost, BAK Free, (TRAVATAN) 0.004 % SOLN ophthalmic solution Place 1 drop into both eyes at bedtime.   Yes [provider]   Current Facility-Administered Medications  Medication Dose Route Frequency Provider Last Rate Last Dose  . ibuprofen (ADVIL,MOTRIN) tablet 800 mg  800 mg Oral Once Eber Hong, MD       Current Outpatient Prescriptions  Medication Sig Dispense Refill  . acetaminophen (TYLENOL) 650 MG CR tablet Take 650 mg by mouth every 8 (eight) hours as needed for pain.     Marland Kitchen amLODipine (NORVASC) 10 MG tablet Take 10 mg by mouth at bedtime.     Marland Kitchen aspirin EC 81 MG tablet Take 81 mg by mouth at bedtime.     . B Complex-C-Folic Acid (RENAL-VITE) 0.8 MG TABS Take 1 tablet by mouth every evening. Vitamin B complex-vitamin C folic acid    . calcium acetate (PHOSLO) 667 MG capsule Take 667-1,334 mg by mouth See admin instructions. Take 2 capsules (1334 mg) by mouth 3 times daily with meals and 1 capsule (667 mg) with snacks    . cinacalcet (SENSIPAR) 60 MG tablet Take 60 mg by mouth daily. Monday, Wednesday and Friday    . cloNIDine (CATAPRES) 0.2 MG tablet Take 0.2 mg by mouth 3 (three) times daily.     . cyclobenzaprine (FLEXERIL) 5 MG tablet Take 5 mg by mouth 3 (three) times daily as needed for muscle spasms.     . diphenhydrAMINE (BENADRYL) 50 MG tablet Take 50 mg by mouth 2 (two) times daily as needed for allergies.     Marland Kitchen esomeprazole (NEXIUM) 40 MG capsule Take 40 mg by mouth at bedtime.    Marland Kitchen ethyl chloride spray Apply 1 application topically daily as needed (dialysis). For use at dialysis     . gabapentin (NEURONTIN) 100 MG capsule Take 1 capsule (100 mg total) by mouth 3 (three) times daily. 30 capsule 0  . hydrALAZINE (APRESOLINE) 50 MG tablet Take 50 mg by mouth every 8 (eight) hours. Take one tablet every 8 hours    . HYDROcodone-acetaminophen (NORCO/VICODIN) 5-325 MG  tablet Take 1 tablet by mouth daily as needed for moderate pain.     Marland Kitchen. insulin aspart (NOVOLOG) 100 UNIT/ML injection Inject 0-15 Units into the skin 3 (three) times daily with meals. Inject three times a day before meals  70-120 = 0 units 121-150 =2 units 151-200 - 3 units 201-250 = 5 units 251-300 = 8 units 301-350 = 11 units 351-400 = 15 units Greater than 400 call provider and give 15 units    . isosorbide mononitrate (IMDUR) 30 MG 24 hr tablet Take 15 mg by mouth daily at 12 noon.    . labetalol (NORMODYNE) 300 MG tablet Take 300 mg by mouth See admin instructions. Take 1 tablet (300 mg) by mouth twice daily - at noon and at bedtime    . losartan (COZAAR) 100 MG tablet Take 100 mg by mouth at bedtime.     . OXYGEN Inhale into the lungs as needed (shortness of breath).    . ranitidine (ZANTAC) 75 MG tablet Take 75 mg by mouth every evening.     . SENNA PO Take 2 tablets by mouth daily.    . Travoprost, BAK Free, (TRAVATAN) 0.004 % SOLN ophthalmic solution Place 1 drop into both eyes at bedtime.     Labs: Basic Metabolic Panel:  Recent Labs Lab 06/29/17 1912  NA 131*  K 6.0*  CL 90*  CO2 27  GLUCOSE 189*  BUN 69*  CREATININE 7.02*  CALCIUM 9.8   CBC:  Recent Labs Lab 06/29/17 1912  WBC 7.7  NEUTROABS 5.1  HGB 9.8*  HCT 29.9*  MCV 86.4  PLT 447*   CBG:  Recent Labs Lab 06/29/17 1637 06/29/17 1952 06/30/17 0740 06/30/17 1343  GLUCAP 221* 171* 274* 331*   Studies/Results: Dg Chest 2 View  Result Date: 06/29/2017 CLINICAL DATA:  Cough, runny nose, nausea, vomiting and diarrhea today. EXAM: CHEST  2 VIEW COMPARISON:  06/09/2017 FINDINGS: Clearing of right upper lobe airspace disease and right effusion since prior. No new pulmonary consolidation or evidence of CHF. Stable cardiomegaly with aortic atherosclerosis. Subsegmental atelectasis and/or scarring in the left mid lung subcortical cystic change noted both humeral heads. No acute osseous appearing  abnormality. IMPRESSION: No active cardiopulmonary disease. Stable cardiomegaly with aortic atherosclerosis. Electronically Signed   By: Tollie Ethavid  Kwon M.D.   On: 06/29/2017 21:23   Dg Abdomen 1 View  Result Date: 06/29/2017 CLINICAL DATA:  Left lower abdominal pain for 3 days, nausea and vomiting today. EXAM: ABDOMEN - 1 VIEW COMPARISON:  None. FINDINGS: Bowel gas pattern is nonobstructive. Moderate amount of stool throughout the nondistended colon. No evidence of soft tissue mass or abnormal fluid collection. No evidence of free intraperitoneal air. Aortoiliac atherosclerosis. Surgical changes about the left inguinal region. No acute or suspicious osseous finding. IMPRESSION: No acute findings.  Nonobstructive bowel gas pattern. Aortic atherosclerosis. Electronically Signed   By: Bary RichardStan  Maynard M.D.   On: 06/29/2017 18:22    Dialysis Orders:  MWF at [facility] 4hr, BFR 400, DFR 800, EDW 71kg, 2K/2.25Ca bath, AVG, Heparin 3000 bolus, UF profile 2 - Hectoral 1mcg IV q HD - Mircera 100mcg IV q 2 weeks (last given 7/25)  Assessment/Plan: 1.  Weakness: For possible readmission to rehab?, per ED/SW/case management. 2.  Hyperkalemia: K 6, dialyzing today to correct. 3.  ESRD:  Continue MWF schedule. 4.  Hypertension/volume: BP high, > 5kg above EDW. UF goal 4L today. 5.  Anemia: Hgb 9.8, will resume ESA if admitted. 6.  Metabolic bone disease: Ca ok, will resume VDRA if admitted. 7. Type 2 DM: on insulin.  Ozzie Hoyle, PA-C 06/30/2017, 2:14 PM  [facility] Pager: [phone]  Pt seen, examined and agree w A/P as above. ESRD pt w/ gen'd weakness needs HD today.  Plan HD upstairs.  See orders.  Vinson Moselle MD [employer] pager [phone]   07/01/2017, 1:19 PM

## 2017-06-30 NOTE — ED Notes (Signed)
Patient returned from dialysis with multiple bandages on fistula site. The patient states she is experiences 10/10 pain at fistula site and that bandages are irritating. This RN replaced bandages and wrapped site. See wound documentation.

## 2017-06-30 NOTE — ED Notes (Signed)
Case manager at bedside speaking with patient, patient's daughter and dr tegeler

## 2017-06-30 NOTE — Progress Notes (Signed)
CSW attempted to reach out to daughter Myrene Buddy(Yvonne) however the number is currently disconnected.    Claude MangesKierra S. Leshonda Galambos, MSW, LCSW-A Emergency Department Clinical Social Worker [phone]

## 2017-06-30 NOTE — ED Notes (Addendum)
Spoke with Dialysis RN, Angelito who called regarding where patient will go after dialysis, this RN advised that patient is medically cleared by ED provider and based on provider notes for patient, she does not meet criteria for admission and  is still awaiting placement at SNF, however, this RN also spoke with CSW who advised that patient's insurance will not cover SNF placement at this time. Dialysis RN made aware of this.

## 2017-06-30 NOTE — ED Notes (Signed)
CSW at bedside.

## 2017-06-30 NOTE — ED Notes (Addendum)
Pt eating breakfast and transferred to hospital bed

## 2017-06-30 NOTE — Progress Notes (Signed)
[facility] notified CSW that they do not have long term Medicaid beds available at this time.    Claude MangesKierra S. Joclyn Alsobrook, MSW, LCSW-A Emergency Department Clinical Social Worker [phone]

## 2017-06-30 NOTE — ED Notes (Signed)
CBG 331. 

## 2017-06-30 NOTE — ED Notes (Signed)
Pt transported to dialysis, dialysis RN made aware that patient is medically cleared and will not be returning to the ED after her session is done.

## 2017-06-30 NOTE — Evaluation (Signed)
Physical Therapy Evaluation Patient Details Name: Rennie PlowmanDianne Dorwart MRN: [id number] DOB: 02/15/1946 Today's Date: 06/30/2017   History of Present Illness  Pt is a [age] yo female being seen in the ED on 06/30/17 after one day of nausea and vomiting. Pt was recently DC'd from [facility] on 06/28/17 and was home x 1 day before returning to hospital. [facility] that pt was not ready to come home and should have continued at rehab. PMH significant for ESRD on HD MWF, PAD, HTN, CHF, CAD, DM.  Clinical Impression  Pt presents with the above diagnosis and below deficits for therapy evaluation. Prior to admission, pt was recently DC'd from a SNF where she was receiving rehab and returned to her Pastor's home as she was moved out of her home. Pt's family is concerned about pt being home alone due to current functional deficits. Pt requires Min guard for bed mobility this session, but refused any transfers or gait this session due to pain in left ankle. Due to multiple health co-morbidities and lack of mobility this session pt will benefit from SNF at discharge. Pt continues to benefit from acute PT services in order to address the below deficits prior to D/C.     Follow Up Recommendations SNF;Supervision/Assistance - 24 hour    Equipment Recommendations  None recommended by PT    Recommendations for Other Services       Precautions / Restrictions Precautions Precautions: Fall Restrictions Weight Bearing Restrictions: No      Mobility  Bed Mobility Overal bed mobility: Needs Assistance Bed Mobility: Supine to Sit;Sit to Supine     Supine to sit: Min guard Sit to supine: Min guard   General bed mobility comments: Min gaurd for safety with cues for hand placement on rail. Pt needs multiple attempts to push self up to EOB  Transfers                 General transfer comment: Pt refused transferring due to increased pain in Left ankle.   Ambulation/Gait             General Gait Details: Pt refusing gait due to left ankle pain.   Stairs            Wheelchair Mobility    Modified Rankin (Stroke Patients Only)       Balance Overall balance assessment: Needs assistance Sitting-balance support: No upper extremity supported;Feet supported Sitting balance-Leahy Scale: Good                                       Pertinent Vitals/Pain Pain Assessment: 0-10 Pain Score: 10-Worst pain ever Pain Location: left ankle Pain Descriptors / Indicators: Constant;Grimacing;Guarding Pain Intervention(s): Monitored during session;Repositioned;Patient requesting pain meds-RN notified    Home Living Family/patient expects to be discharged to:: Skilled nursing facility Living Arrangements: Alone               Additional Comments: family has nurse coming part time, RN came into house and recommended pt go back to rehab.     Prior Function Level of Independence: Independent with assistive device(s)         Comments: used Rw for stability     Hand Dominance   Dominant Hand: Right    Extremity/Trunk Assessment   Upper Extremity Assessment Upper Extremity Assessment: Generalized weakness    Lower Extremity Assessment Lower Extremity Assessment:  LLE deficits/detail LLE Deficits / Details: pt with pain and weakness in left ankle. At least 3/5 grossly. Limited by pain.  LLE: Unable to fully assess due to pain    Cervical / Trunk Assessment Cervical / Trunk Assessment: Kyphotic  Communication   Communication: No difficulties  Cognition Arousal/Alertness: Awake/alert Behavior During Therapy: WFL for tasks assessed/performed Overall Cognitive Status: Within Functional Limits for tasks assessed                                        General Comments General comments (skin integrity, edema, etc.): Pt agreeable to go to SNF due to current functional mobility. Pt does not have anywhere to stay if she  DC'd.     Exercises     Assessment/Plan    PT Assessment Patient needs continued PT services  PT Problem List Decreased strength;Decreased activity tolerance;Decreased balance;Decreased mobility;Decreased knowledge of use of DME;Pain       PT Treatment Interventions DME instruction;Gait training;Functional mobility training;Therapeutic activities;Therapeutic exercise;Balance training;Patient/family education    PT Goals (Current goals can be found in the Care Plan section)  Acute Rehab PT Goals Patient Stated Goal: to go to rehab and then return home PT Goal Formulation: With patient Time For Goal Achievement: 07/14/17 Potential to Achieve Goals: Good    Frequency Min 2X/week   Barriers to discharge        Co-evaluation               AM-PAC PT "6 Clicks" Daily Activity  Outcome Measure Difficulty turning over in bed (including adjusting bedclothes, sheets and blankets)?: Total Difficulty moving from lying on back to sitting on the side of the bed? : Total Difficulty sitting down on and standing up from a chair with arms (e.g., wheelchair, bedside commode, etc,.)?: Total Help needed moving to and from a bed to chair (including a wheelchair)?: Total Help needed walking in hospital room?: Total Help needed climbing 3-5 steps with a railing? : Total 6 Click Score: 6    End of Session Equipment Utilized During Treatment: Gait belt Activity Tolerance: Patient limited by pain Patient left: in bed;with call bell/phone within reach Nurse Communication: Mobility status PT Visit Diagnosis: Unsteadiness on feet (R26.81);Difficulty in walking, not elsewhere classified (R26.2);Pain Pain - Right/Left: Left Pain - part of body: Ankle and joints of foot    Time: 1610-9604 PT Time Calculation (min) (ACUTE ONLY): 22 min   Charges:   PT Evaluation $PT Eval Moderate Complexity: 1 Mod     PT G Codes:        Colin Broach PT, DPT  [phone]   Ruel Favors Aletha Halim 06/30/2017,  1:26 PM

## 2017-06-30 NOTE — ED Notes (Signed)
Spoke with charge RN regarding patient's situation, Charge RN Melissa advised that she had spoke with CSW regarding patient and CSW stated that patient may be waiting a while for placement. Charge RN Melissa advised that patient will have to return to ED after dialysis to await placement. Angelito, RN in dialysis made aware to contact this RN when patient is done with her session.

## 2017-06-30 NOTE — Progress Notes (Signed)
CSW spoke with pt at bedside to gather more information on clients situation at this time. Pt informed CSW that pt was discharged from [facility] on Saturday and was living with pt's pastor as a result of pt's home being sold. Pt informed CSW that pt has four daughters but currently isn't able to live with either of them at this time. Pt is unsure as to what steps are next in care but asked that CSW consult with pt's daughter in making decisions at this time   Claude MangesKierra S. Sharvil Hoey, MSW, LCSW-A Emergency Department Clinical Social Worker [phone]

## 2017-06-30 NOTE — ED Notes (Signed)
Report given to dialysis. Stated to this nurse that they will be ready for her in 30 minutes.

## 2017-06-30 NOTE — NC FL2 (Signed)
[place] MEDICAID FL2 LEVEL OF CARE SCREENING TOOL     IDENTIFICATION  Patient Name: Colleen Mullins Birthdate: 09-04-1946 Sex: female Admission Date (Current Location): 06/29/2017  [facility] and [place] Number:  Producer, television/film/video and Address:  [facility] [place]. [facility], [address], [place], [place] [address]      Provider Number: [id number]  Attending Physician Name and Address:  Rolan Bucco, MD  Relative Name and Phone Number:       Current Level of Care: Hospital Recommended Level of Care: SNF Prior Approval Number:    Date Approved/Denied:   PASRR Number:    [id number]  Discharge Plan: SNF    Current Diagnoses: Patient Active Problem List   Diagnosis Date Noted  . Acute on chronic respiratory failure with hypoxia (HCC) 06/14/2017  . Acute on chronic diastolic (congestive) heart failure (HCC) 06/14/2017  . Chest wall pain 06/14/2017  . Aspiration pneumonia (HCC) 06/14/2017  . Right shoulder pain 06/14/2017  . GERD (gastroesophageal reflux disease) 06/14/2017  . PVD (peripheral vascular disease) (HCC) 03/18/2017  . CAD in native artery 08/12/2016  . Abnormal nuclear stress test 07/10/2016  . Musculoskeletal chest pain   . Anemia in chronic renal disease 07/08/2016  . Dyspnea on exertion 04/14/2016  . ESRD on dialysis (HCC) 04/14/2016  . Hypertensive heart disease with congestive heart failure (HCC) 04/14/2016  . Hyperlipidemia 04/14/2016  . 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 Hypoglycemia 10/16/2015  . Elevated troponin 10/16/2015  . Ejection fraction   . Type 2 diabetes mellitus with renal complication (HCC) 09/26/2011  . Other complications due to renal dialysis device, implant, and graft 09/26/2011    Orientation RESPIRATION BLADDER Height & Weight     Self, Time, Situation, Place  Normal Continent Weight:   Height:     BEHAVIORAL SYMPTOMS/MOOD NEUROLOGICAL BOWEL NUTRITION STATUS      Continent Diet (regular)   AMBULATORY STATUS COMMUNICATION OF NEEDS Skin   Limited Assist Verbally Other (Comment) (pt is a dialysis pt. )                       Personal Care Assistance Level of Assistance  Bathing, Feeding, Dressing Bathing Assistance: Limited assistance Feeding assistance: Independent Dressing Assistance: Limited assistance     Functional Limitations Info  Sight, Hearing, Speech Sight Info: Adequate Hearing Info: Adequate Speech Info: Adequate    SPECIAL CARE FACTORS FREQUENCY  PT (By licensed PT) (pt is also on dialysis. )     PT Frequency: 2 times a week.               Contractures Contractures Info: Not present    Additional Factors Info  Code Status, Allergies Code Status Info: Prior  Allergies Info: Iodinated Diagnostic Agents, Lactose Intolerance (Gi)           Current Medications (06/30/2017):  This is the current hospital active medication list Current Facility-Administered Medications  Medication Dose Route Frequency Provider Last Rate Last Dose  . ibuprofen (ADVIL,MOTRIN) tablet 800 mg  800 mg Oral Once Eber Hong, MD       Current Outpatient Prescriptions  Medication Sig Dispense Refill  . acetaminophen (TYLENOL) 650 MG CR tablet Take 650 mg by mouth every 8 (eight) hours as needed for pain.     Marland Kitchen amLODipine (NORVASC) 10 MG tablet Take 10 mg by mouth at bedtime.     Marland Kitchen aspirin EC 81 MG tablet Take 81 mg by mouth at bedtime.     Marland Kitchen  B Complex-C-Folic Acid (RENAL-VITE) 0.8 MG TABS Take 1 tablet by mouth every evening. Vitamin B complex-vitamin C folic acid    . calcium acetate (PHOSLO) 667 MG capsule Take 667-1,334 mg by mouth See admin instructions. Take 2 capsules (1334 mg) by mouth 3 times daily with meals and 1 capsule (667 mg) with snacks    . cinacalcet (SENSIPAR) 60 MG tablet Take 60 mg by mouth daily. Monday, Wednesday and Friday    . cloNIDine (CATAPRES) 0.2 MG tablet Take 0.2 mg by mouth 3 (three) times daily.     . cyclobenzaprine (FLEXERIL) 5 MG  tablet Take 5 mg by mouth 3 (three) times daily as needed for muscle spasms.     . diphenhydrAMINE (BENADRYL) 50 MG tablet Take 50 mg by mouth 2 (two) times daily as needed for allergies.     Marland Kitchen. esomeprazole (NEXIUM) 40 MG capsule Take 40 mg by mouth at bedtime.    Marland Kitchen. ethyl chloride spray Apply 1 application topically daily as needed (dialysis). For use at dialysis     . gabapentin (NEURONTIN) 100 MG capsule Take 1 capsule (100 mg total) by mouth 3 (three) times daily. 30 capsule 0  . hydrALAZINE (APRESOLINE) 50 MG tablet Take 50 mg by mouth every 8 (eight) hours. Take one tablet every 8 hours    . HYDROcodone-acetaminophen (NORCO/VICODIN) 5-325 MG tablet Take 1 tablet by mouth daily as needed for moderate pain.     Marland Kitchen. insulin aspart (NOVOLOG) 100 UNIT/ML injection Inject 0-15 Units into the skin 3 (three) times daily with meals. Inject three times a day before meals  70-120 = 0 units 121-150 =2 units 151-200 - 3 units 201-250 = 5 units 251-300 = 8 units 301-350 = 11 units 351-400 = 15 units Greater than 400 call provider and give 15 units    . isosorbide mononitrate (IMDUR) 30 MG 24 hr tablet Take 15 mg by mouth daily at 12 noon.    . labetalol (NORMODYNE) 300 MG tablet Take 300 mg by mouth See admin instructions. Take 1 tablet (300 mg) by mouth twice daily - at noon and at bedtime    . losartan (COZAAR) 100 MG tablet Take 100 mg by mouth at bedtime.     . OXYGEN Inhale into the lungs as needed (shortness of breath).    . ranitidine (ZANTAC) 75 MG tablet Take 75 mg by mouth every evening.     . SENNA PO Take 2 tablets by mouth daily.    . Travoprost, BAK Free, (TRAVATAN) 0.004 % SOLN ophthalmic solution Place 1 drop into both eyes at bedtime.       Discharge Medications: Please see discharge summary for a list of discharge medications.  Relevant Imaging Results:  Relevant Lab Results:   Additional Information [id number]  Robb MatarKierra S Jamear Carbonneau, LCSWA

## 2017-06-30 NOTE — ED Notes (Signed)
Pt requesting update on plan. I informed her I would have Dr. Rush Landmarkegeler speak with her.

## 2017-06-30 NOTE — Care Management (Signed)
Patient returned from HD complaining of site pain 10/10. Pain assessed by Dr. Sherry Ruffing patient given ED CM met with patient and family at bedside hospitalist was consulted for possible obs for pain management. Hospitalist evaluated patient and does not think patient's symptoms determine obs admission. Dr. Sherry Ruffing and this CM met with patient and family at bedside to discuss with family, they appeared somewhat upset but ensured patient and family that [facility] assistance was ordered.  Some will call tomorrow to arrange initial visit.

## 2017-06-30 NOTE — Progress Notes (Signed)
CSW spoke with Myrene BuddyYvonne pt's daughter. She informed CSW that she would be at the hospital soon and would like to speak with CSW and CM RN about pt needs.    Claude MangesKierra S. Sarabeth Benton, MSW, LCSW-A Emergency Department Clinical Social Worker [phone]

## 2017-06-30 NOTE — Progress Notes (Addendum)
CSW received call from CM detailing daytime CSW Dept's attempts at assisting pt with placement.  CSW reviewed chart and pt and pt's [employer] will no longer pay due to pt using "allotted days" with a previous SNF at [facility] stay this month.  Per notes, ED CSW Dept during the day attempted ALF placement using pt's secondary insurance Medicaid but placement could not be facilitated due to "pt not meeting criteria that daughter is seeking placement for", per notes and must "private pay", per CM, "for placement and daughter states she could not afford to do so".    Per notes, pt's "daughter was given additional HH services and was agreeable to taking pt home with her" but CSW spoke with CM who informed the CSW that the pt's daughter is now refusing to take her mother home.  CM will now speak to the pt's daughter reiterating pt has no payor and as such there are no placement options available to the pt at this time from the E.D.    CSW will continue to follow.  11:02 PM CSW spoke with CM family is in agreement with D/C'ing home with [facility]. Please reconsult if future social work needs arise.  CSW signing off, as social work intervention is no longer needed.   Dorothe PeaJonathan F. Nickol Collister, Francesco SorLCSWA, LCAS, CSI Clinical Social Worker Ph: [phone]

## 2017-06-30 NOTE — Progress Notes (Signed)
CSW was notified by Clinical Supervisor to send pt information to SNF that accept Medicaid considering pt is being recommend for SNF. CSW has been in contact with Olegario MessierKathy from [facility]. Olegario MessierKathy notified CSW that she will contact CSW back ASAP.     Claude MangesKierra S. Efraim Vanallen, MSW, LCSW-A Emergency Department Clinical Social Worker [phone]

## 2017-06-30 NOTE — ED Provider Notes (Signed)
Discussed with Dr. Arlean HoppingSchertz, on call for nephrology. Patient will be seen in the morning and dialysis arranged.   Gilda CreasePollina, Penny Arrambide J, MD 06/30/17 [phone]

## 2017-06-30 NOTE — ED Provider Notes (Signed)
7:29 PM Patient returned from dialysis and I evaluated her.  Patient is reporting that she is having severe pain in her right arm where they stuck her for dialysis. She is saying she is also having pain all over her body. She describes her pain as "15 out of 10". She says that she is still feeling fatigued and very tired. She denies current fevers, chills, nausea, vomiting, constipation, diarrhea, or rashes.  Initially, the social work team was helping to determine disposition of patient. Initially, the plan was to have the patient be discharged back to her family after her dialysis session today with increased home health.  After my assessment, the patient reports that she is continuing to have severe pain and does not feel safe to go home. The family reports that they do not feel she is safe to go home. Patient required IV narcotic pain medicine to help with her pain.  After looking back through the notes, the physical therapy evaluation performed earlier today reports that patient needs 24-hour supervision and assistance as well as possible skilled nursing facility.  Due to patient's recommendation by physical therapy of 24 assistance, the patient's continued pain requiring pain medications, the care manager team expressing concerns about discharging the patient,  and the family's concern about the safety of her discharge at this time, the admitting team will again be called to discuss admission.    The hospitalist team was called and they again did not feel patient was appropriate for admission inpatient or observation.  The care coordination team was present when the patient was informed that she could not be admitted tonight. Home health orders were placed as ordered by the care coordination team. Patient was initially frustrated with inability to be admitted or get placed into a nursing facility however, they understood the situation at hand.  Patient will go on to daughter's house with the  home health assistance and will follow with the primary care physician for further discussions on level of care and management. Patient had no other questions or concerns and was given prescription for pain and nausea medicine to help with the presenting symptoms. Patient had no other problems and was discharged in good condition. And erratic all     Clinical Impression: 1. Hypertension, unspecified type   2. Physical deconditioning   3. Hyperkalemia     Disposition: Discharge  Condition: Good  I have discussed the results, Dx and Tx plan with the pt(& family if present). He/she/they expressed understanding and agree(s) with the plan. Discharge instructions discussed at great length. Strict return precautions discussed and pt &/or family have verbalized understanding of the instructions. No further questions at time of discharge.    Discharge Medication List as of 06/30/2017 11:47 PM    START taking these medications   Details  !! HYDROcodone-acetaminophen (NORCO) 5-325 MG tablet Take 1 tablet by mouth every 4 (four) hours as needed for moderate pain., Starting Mon 06/30/2017, Print    ondansetron (ZOFRAN) 4 MG tablet Take 1 tablet (4 mg total) by mouth every 8 (eight) hours as needed for nausea or vomiting., Starting Mon 06/30/2017, Print     !! - Potential duplicate medications found. Please discuss with provider.      Follow Up: Home, [facility] [address] [employer] [facility] [address] [place] [address] [phone]  Follow up Home health services RN, Physcial Therapy, Occupational Therapy, Home Health Aide, Social Worker  [place] Shutter, MD [address] [place] [address]  [phone]     [facility] [address] [id number] [place] [phone]  If symptoms worsen     Santasia Rew, Canary Brimhristopher J, MD 07/01/17 [phone]

## 2017-07-01 NOTE — Procedures (Signed)
Pt stable on HD. BFR 350, VP 160  I was present at this dialysis session, have reviewed the session itself and made  appropriate changes Rob Olayinka Gathers MD [facility] pager [phone]   06/30/2017, 3:05 PM

## 2017-07-03 ENCOUNTER — Encounter (HOSPITAL_COMMUNITY): Payer: Self-pay | Admitting: Obstetrics and Gynecology

## 2017-07-03 ENCOUNTER — Emergency Department (HOSPITAL_COMMUNITY)
Admission: EM | Admit: 2017-07-03 | Discharge: 2017-07-04 | Disposition: A | Discharge status: Home / Self Care | Payer: Medicare HMO | Attending: Emergency Medicine | Admitting: Emergency Medicine

## 2017-07-03 DIAGNOSIS — Z79899 Other long term (current) drug therapy: Secondary | ICD-10-CM | POA: Diagnosis not present

## 2017-07-03 DIAGNOSIS — E119 Type 2 diabetes mellitus without complications: Secondary | ICD-10-CM | POA: Diagnosis not present

## 2017-07-03 DIAGNOSIS — T82590A Other mechanical complication of surgically created arteriovenous fistula, initial encounter: Secondary | ICD-10-CM | POA: Diagnosis not present

## 2017-07-03 DIAGNOSIS — I2510 Atherosclerotic heart disease of native coronary artery without angina pectoris: Secondary | ICD-10-CM | POA: Diagnosis not present

## 2017-07-03 DIAGNOSIS — Z794 Long term (current) use of insulin: Secondary | ICD-10-CM | POA: Insufficient documentation

## 2017-07-03 DIAGNOSIS — Z7982 Long term (current) use of aspirin: Secondary | ICD-10-CM | POA: Insufficient documentation

## 2017-07-03 DIAGNOSIS — I5033 Acute on chronic diastolic (congestive) heart failure: Secondary | ICD-10-CM | POA: Insufficient documentation

## 2017-07-03 DIAGNOSIS — Y69 Unspecified misadventure during surgical and medical care: Secondary | ICD-10-CM | POA: Diagnosis not present

## 2017-07-03 DIAGNOSIS — Z992 Dependence on renal dialysis: Secondary | ICD-10-CM | POA: Diagnosis not present

## 2017-07-03 DIAGNOSIS — Z87891 Personal history of nicotine dependence: Secondary | ICD-10-CM | POA: Insufficient documentation

## 2017-07-03 DIAGNOSIS — I110 Hypertensive heart disease with heart failure: Secondary | ICD-10-CM | POA: Insufficient documentation

## 2017-07-03 DIAGNOSIS — N186 End stage renal disease: Secondary | ICD-10-CM | POA: Insufficient documentation

## 2017-07-03 DIAGNOSIS — T82838A Hemorrhage of vascular prosthetic devices, implants and grafts, initial encounter: Secondary | ICD-10-CM | POA: Diagnosis present

## 2017-07-03 MED ORDER — AMLODIPINE BESYLATE 5 MG PO TABS
10.0000 mg | ORAL_TABLET | Freq: Once | ORAL | Status: AC
  Administered 2017-07-03: 10 mg via ORAL
  Filled 2017-07-03: qty 2

## 2017-07-03 NOTE — Discharge Instructions (Signed)
Apply dressing to affected area as indicated. Follow up with dialysis as scheduled regularly. Return to ED for worsening symptoms, numbness, weakness, increased bleeding, lightheadedness, loss of consciousness.

## 2017-07-03 NOTE — ED Notes (Signed)
Bed: WA03 Expected date:  Expected time:  Means of arrival:  Comments: EMS dialysis pt

## 2017-07-03 NOTE — ED Provider Notes (Signed)
[facility]-EMERGENCY DEPT Provider Note   CSN: [id number] Arrival date & time: 07/03/17  2059     History   Chief Complaint Chief Complaint  Patient presents with  . Bleeding from [place]    HPI Colleen Mullins is a 71 y.o. female.  HPI  Patient, with past medical history of ESRD on dialysis, diabetes, hypertension presents to ED for evaluation of bleeding from right arm port that began today. She states that there has been blood oozing from the site throughout the whole day. She has had to change the bandage "about 50 times." She was seen at her primary care's office this morning and stated that they were not concerned about the area and applied a gauze dressing. She is worried because this has never happened before. She is scheduled for dialysis tomorrow. She receives dialysis every Monday, Wednesday, and Fridays. She states that she has not taken her blood pressure medications or other medications for the past 4 days because she does not have any of her belongings since being discharged from rehabilitation last week. She denies any chest pain, trouble breathing, abdominal pain, numbness, weakness, lightheadedness or loss of consciousness.  Past Medical History:  Diagnosis Date  . Abnormal nuclear stress test 07/10/2016  . Anemia    low iron  . Anemia in chronic renal disease 07/08/2016  . Arthritis   . CAD in native artery 08/12/2016   a. Nonobstructive ASCAD by cath with 60% D1 and 50% PL off RCA and 25% mid LAD.  . Diabetes mellitus    type 2  . Diabetic retinopathy (HCC)   . Dialysis patient [facility](HCC)    M-W-F @ [facility]  . Diastolic dysfunction   . Dyspnea on exertion 04/14/2016  . Ejection fraction   . Elevated troponin 10/16/2015  . ESRD on dialysis (HCC) 04/14/2016  . ESRD on hemodialysis [facility](HCC)    Dialysis T/Th/Sa  . GERD (gastroesophageal reflux disease)   . Glaucoma   . H/O: Bell's palsy 2007  . Hyperlipidemia   . Hypertension    a. labile - hx of BP dropping at HD.  .  Macular degeneration   . Musculoskeletal chest pain   . PAD (peripheral artery disease) (HCC)    a. ABI 08/2016: mod RLE disease, normal L ABI.  Marland Kitchen. 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 PVD (peripheral vascular disease) (HCC) 03/18/2017  . Type 2 diabetes mellitus with renal complication (HCC) 09/26/2011    Patient Active Problem List   Diagnosis Date Noted  . Acute on chronic respiratory failure with hypoxia (HCC) 06/14/2017  . Acute on chronic diastolic (congestive) heart failure (HCC) 06/14/2017  . Chest wall pain 06/14/2017  . Aspiration pneumonia (HCC) 06/14/2017  . Right shoulder pain 06/14/2017  . GERD (gastroesophageal reflux disease) 06/14/2017  . PVD (peripheral vascular disease) (HCC) 03/18/2017  . CAD in native artery 08/12/2016  . Abnormal nuclear stress test 07/10/2016  . Musculoskeletal chest pain   . Anemia in chronic renal disease 07/08/2016  . Dyspnea on exertion 04/14/2016  . ESRD on dialysis (HCC) 04/14/2016  . Hypertensive heart disease with congestive heart failure (HCC) 04/14/2016  . Hyperlipidemia 04/14/2016  . 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 Hypoglycemia 10/16/2015  . Elevated troponin 10/16/2015  . Ejection fraction   . Type 2 diabetes mellitus with renal complication (HCC) 09/26/2011  . Other complications due to renal dialysis device, implant, and graft 09/26/2011    Past Surgical History:  Procedure Laterality Date  . ABDOMINAL HYSTERECTOMY    .  ARTERIOVENOUS GRAFT PLACEMENT     Left forearm x 2, one removed in March  . ARTERIOVENOUS GRAFT PLACEMENT     Left forearm  . AVGG REMOVAL  10/08/2011   Procedure: REMOVAL OF ARTERIOVENOUS GORETEX GRAFT (AVGG);  Surgeon: Sherren Kerns, MD;  Location: [facility] OR;  Service: Vascular;  Laterality: Left;  . BTL    . CARDIAC CATHETERIZATION N/A 07/10/2016   Procedure: Left Heart Cath and Coronary Angiography;  Surgeon: Yvonne Kendall, MD;  Location: [facility] INVASIVE CV  LAB;  Service: Cardiovascular;  Laterality: N/A;  . COLONOSCOPY    . EXCHANGE OF A DIALYSIS CATHETER Right 03/27/2016   Procedure: EXCHANGE OF A DIALYSIS CATHETER;  Surgeon: Fransisco Hertz, MD;  Location: [facility] OR;  Service: Vascular;  Laterality: Right;  . EYE SURGERY Left    Lasik surgery on left. Cataract surgery on both eyes  . LAPAROTOMY  ?2000   Pelvic mass (Benign)  . REVISION OF ARTERIOVENOUS GORETEX GRAFT Left 03/27/2016   Procedure: REVISION OF LEFT ARTERIOVENOUS GORETEX GRAFT;  Surgeon: Fransisco Hertz, MD;  Location: [facility] OR;  Service: Vascular;  Laterality: Left;  . TONSILLECTOMY      OB History    No data available       Home Medications    Prior to Admission medications   Medication Sig Start Date End Date Taking? Authorizing Provider  acetaminophen (TYLENOL) 650 MG CR tablet Take 650 mg by mouth every 8 (eight) hours as needed for pain.    Yes [provider]  amLODipine (NORVASC) 10 MG tablet Take 10 mg by mouth at bedtime.    Yes [provider]  aspirin EC 81 MG tablet Take 81 mg by mouth at bedtime.    Yes [provider]  B Complex-C-Folic Acid (RENAL-VITE) 0.8 MG TABS Take 1 tablet by mouth every evening. Vitamin B complex-vitamin C folic acid   Yes [provider]  calcium acetate (PHOSLO) 667 MG capsule Take 667-1,334 mg by mouth See admin instructions. Take 2 capsules (1334 mg) by mouth 3 times daily with meals and 1 capsule (667 mg) with snacks   Yes [provider]  cinacalcet (SENSIPAR) 60 MG tablet Take 60 mg by mouth daily. Monday, Wednesday and Friday   Yes [provider]  cyclobenzaprine (FLEXERIL) 5 MG tablet Take 5 mg by mouth 3 (three) times daily as needed for muscle spasms.    Yes [provider]  diazepam (VALIUM) 5 MG tablet Take 5 mg by mouth every 6 (six) hours as needed for anxiety.   Yes [provider]  esomeprazole (NEXIUM) 40 MG capsule Take 40 mg by mouth at bedtime. 03/11/17  Yes  [provider]  ethyl chloride spray Apply 1 application topically daily as needed (dialysis). For use at dialysis    Yes [provider]  gabapentin (NEURONTIN) 100 MG capsule Take 1 capsule (100 mg total) by mouth 3 (three) times daily. 12/30/16  Yes Joseph Art, DO  HYDROcodone-acetaminophen (NORCO) 5-325 MG tablet Take 1 tablet by mouth every 4 (four) hours as needed for moderate pain. 06/30/17  Yes Tegeler, Canary Brim, MD  insulin aspart (NOVOLOG) 100 UNIT/ML injection Inject 0-15 Units into the skin 3 (three) times daily with meals. Inject three times a day before meals  70-120 = 0 units 121-150 =2 units 151-200 - 3 units 201-250 = 5 units 251-300 = 8 units 301-350 = 11 units 351-400 = 15 units Greater than 400 call provider and give  15 units   Yes [provider]  labetalol (NORMODYNE) 300 MG tablet Take 300 mg by mouth See admin instructions. Take 1 tablet (300 mg) by mouth twice daily - at noon and at bedtime 10/01/16  Yes [provider]  losartan (COZAAR) 100 MG tablet Take 100 mg by mouth at bedtime.    Yes [provider]  Nutritional Supplements (FEEDING SUPPLEMENT, NEPRO CARB STEADY,) LIQD Take 237 mLs by mouth 2 (two) times daily between meals.   Yes [provider]  ondansetron (ZOFRAN) 4 MG tablet Take 1 tablet (4 mg total) by mouth every 8 (eight) hours as needed for nausea or vomiting. 06/30/17  Yes Tegeler, Canary Brim, MD  ranitidine (ZANTAC) 75 MG tablet Take 75 mg by mouth every evening.    Yes [provider]  Travoprost, BAK Free, (TRAVATAN) 0.004 % SOLN ophthalmic solution Place 1 drop into both eyes at bedtime.   Yes [provider]  OXYGEN Inhale into the lungs as needed (shortness of breath).    [provider]    Family History Family History  Problem Relation Age of Onset  . Diabetes Mother   . Heart disease Mother   . COPD Father   . Hypertension Father   . Lung disease  Father   . Diabetes Father   . Diabetes Sister   . Breast cancer Sister     Social History Social History  Substance Use Topics  . Smoking status: Former Smoker    Types: Cigarettes    Quit date: 08/04/1999  . Smokeless tobacco: Never Used  . Alcohol use No     Allergies   Iodinated diagnostic agents and Lactose intolerance (gi)   Review of Systems Review of Systems  Constitutional: Negative for appetite change, chills and fever.  HENT: Negative for ear pain, rhinorrhea, sneezing and sore throat.   Eyes: Negative for photophobia and visual disturbance.  Respiratory: Negative for cough, chest tightness, shortness of breath and wheezing.   Cardiovascular: Negative for chest pain and palpitations.  Gastrointestinal: Negative for abdominal pain, blood in stool, constipation, diarrhea, nausea and vomiting.  Genitourinary: Negative for dysuria, hematuria and urgency.  Musculoskeletal: Negative for myalgias.  Skin: Positive for color change (Bleeding from right forearm port.). Negative for rash.  Neurological: Negative for dizziness, weakness and light-headedness.     Physical Exam Updated Vital Signs BP (!) 224/91 (BP Location: Left Arm)   Pulse 82   Temp 98.7 F (37.1 C) (Oral)   Resp 19   Ht 5\' 6"  (1.676 m)   Wt 71.7 kg (158 lb)   SpO2 98%   BMI 25.50 kg/m   Physical Exam  Constitutional: She appears well-developed and well-nourished. No distress.  HENT:  Head: Normocephalic and atraumatic.  Nose: Nose normal.  Eyes: Conjunctivae and EOM are normal. Left eye exhibits no discharge. No scleral icterus.  Neck: Normal range of motion. Neck supple.  Cardiovascular: Normal rate, regular rhythm, normal heart sounds and intact distal pulses.  Exam reveals no gallop and no friction rub.   No murmur heard. Pulmonary/Chest: Effort normal and breath sounds normal. No respiratory distress.  Abdominal: Soft. Bowel sounds are normal. She exhibits no distension. There is no  tenderness. There is no guarding.  Musculoskeletal: Normal range of motion. She exhibits no edema.  Neurological: She is alert. She exhibits normal muscle tone. Coordination normal.  Skin: Skin is warm and dry. No rash noted.  A/V graft in place with palpable thrill. There is a  small punctuate area of bleeding on the overlying skin. There is some bleeding noted on gauze dressing as well. No discharge noted. No color or temperature change of area. Sensation intact to light touch.  Psychiatric: She has a normal mood and affect.  Nursing note and vitals reviewed.    ED Treatments / Results  Labs (all labs ordered are listed, but only abnormal results are displayed) Labs Reviewed - No data to display  EKG  EKG Interpretation None       Radiology No results found.  Procedures Procedures (including critical care time)  Medications Ordered in ED Medications  amLODipine (NORVASC) tablet 10 mg (10 mg Oral Given 07/03/17 2154)     Initial Impression / Assessment and Plan / ED Course  I have reviewed the triage vital signs and the nursing notes.  Pertinent labs & imaging results that were available during my care of the patient were reviewed by me and considered in my medical decision making (see chart for details).     Patient presents to ED for evaluation of bleeding area overlying A/V graft on right forearm. She states that the area has been bleeding continuously today. She is on a M-W-F schedule for dialysis, and is scheduled for dialysis tomorrow. On physical exam there is a small punctuate area on the skin overlying the graft in the right forearm. There is a palpable thrill. There is no pulsatile bleeding noted. Patient denies any lightheadedness, dizziness or loss of consciousness. Area was sealed with Dermabond and quick clot gauze. Pressure dressing applied. She tolerated the dressing well. Will advise patient to follow up with dialysis as scheduled tomorrow, with a can possibly  reapply the dressing if needed. Patient given dose of home amlodipine here in the ED. Patient appears stable for discharge at this time. Strict return precautions given.  Patient discussed with and seen by Dr. Erma Heritage.  Final Clinical Impressions(s) / ED Diagnoses   Final diagnoses:  Malfunction of arteriovenous graft, initial encounter [facility])    New Prescriptions New Prescriptions   No medications on file     Dietrich Pates, PA-C 07/03/17 2340    Shaune Pollack, MD 07/04/17 1756

## 2017-07-03 NOTE — ED Triage Notes (Signed)
Per EMS: Pt is coming from home. Pt is a dialysis pt who is supposed to have dialysis tomorrow. Pt reports that her dialysis port is bleeding and it was not bleeding after dialysis Wednesday. Pt reports that it is "pouring blood" but EMS reports only a few drops here and there.  The port is in the right arm. Pt has not taken her BP medicine today, stating she takes it at 10pm at night

## 2017-07-04 NOTE — Progress Notes (Signed)
Late Entry G-Codes for visit on 06/29/17    06/30/17 1254  PT G-Codes **NOT FOR INPATIENT CLASS**  Functional Assessment Tool Used AM-PAC 6 Clicks Basic Mobility;Clinical judgement  Functional Limitation Mobility: Walking and moving around  Mobility: Walking and Moving Around Current Status [phone](G8978) CN  Mobility: Walking and Moving Around Goal Status ([id number](G8979) CJ   Colleen Mullins PT, DPT  [phone]

## 2017-07-26 ENCOUNTER — Inpatient Hospital Stay (HOSPITAL_COMMUNITY): Payer: Medicare HMO

## 2017-07-26 ENCOUNTER — Encounter (HOSPITAL_COMMUNITY)
Admission: EM | Disposition: A | Discharge status: Skilled Nursing Facility | Payer: Self-pay | Source: Home / Self Care | Attending: Internal Medicine

## 2017-07-26 ENCOUNTER — Emergency Department (HOSPITAL_COMMUNITY): Payer: Medicare HMO

## 2017-07-26 ENCOUNTER — Inpatient Hospital Stay (HOSPITAL_COMMUNITY)
Admission: EM | Admit: 2017-07-26 | Discharge: 2017-08-01 | DRG: 314 | Disposition: A | Discharge status: Skilled Nursing Facility | Payer: Medicare HMO | Attending: Internal Medicine | Admitting: Internal Medicine

## 2017-07-26 ENCOUNTER — Encounter (HOSPITAL_COMMUNITY): Payer: Self-pay | Admitting: *Deleted

## 2017-07-26 DIAGNOSIS — I10 Essential (primary) hypertension: Secondary | ICD-10-CM

## 2017-07-26 DIAGNOSIS — E1121 Type 2 diabetes mellitus with diabetic nephropathy: Secondary | ICD-10-CM | POA: Diagnosis not present

## 2017-07-26 DIAGNOSIS — H409 Unspecified glaucoma: Secondary | ICD-10-CM | POA: Diagnosis present

## 2017-07-26 DIAGNOSIS — I313 Pericardial effusion (noninflammatory): Secondary | ICD-10-CM | POA: Diagnosis present

## 2017-07-26 DIAGNOSIS — I5032 Chronic diastolic (congestive) heart failure: Secondary | ICD-10-CM | POA: Diagnosis present

## 2017-07-26 DIAGNOSIS — N2581 Secondary hyperparathyroidism of renal origin: Secondary | ICD-10-CM | POA: Diagnosis present

## 2017-07-26 DIAGNOSIS — I4891 Unspecified atrial fibrillation: Secondary | ICD-10-CM | POA: Diagnosis present

## 2017-07-26 DIAGNOSIS — E785 Hyperlipidemia, unspecified: Secondary | ICD-10-CM | POA: Diagnosis present

## 2017-07-26 DIAGNOSIS — E1165 Type 2 diabetes mellitus with hyperglycemia: Secondary | ICD-10-CM | POA: Diagnosis present

## 2017-07-26 DIAGNOSIS — I314 Cardiac tamponade: Secondary | ICD-10-CM | POA: Diagnosis present

## 2017-07-26 DIAGNOSIS — D649 Anemia, unspecified: Secondary | ICD-10-CM

## 2017-07-26 DIAGNOSIS — N186 End stage renal disease: Secondary | ICD-10-CM | POA: Diagnosis present

## 2017-07-26 DIAGNOSIS — Z862 Personal history of diseases of the blood and blood-forming organs and certain disorders involving the immune mechanism: Secondary | ICD-10-CM

## 2017-07-26 DIAGNOSIS — Z87891 Personal history of nicotine dependence: Secondary | ICD-10-CM

## 2017-07-26 DIAGNOSIS — I2510 Atherosclerotic heart disease of native coronary artery without angina pectoris: Secondary | ICD-10-CM | POA: Diagnosis present

## 2017-07-26 DIAGNOSIS — H3530 Unspecified macular degeneration: Secondary | ICD-10-CM | POA: Diagnosis present

## 2017-07-26 DIAGNOSIS — F419 Anxiety disorder, unspecified: Secondary | ICD-10-CM | POA: Diagnosis present

## 2017-07-26 DIAGNOSIS — Z9981 Dependence on supplemental oxygen: Secondary | ICD-10-CM

## 2017-07-26 DIAGNOSIS — D631 Anemia in chronic kidney disease: Secondary | ICD-10-CM | POA: Diagnosis not present

## 2017-07-26 DIAGNOSIS — J9611 Chronic respiratory failure with hypoxia: Secondary | ICD-10-CM | POA: Diagnosis present

## 2017-07-26 DIAGNOSIS — Z79899 Other long term (current) drug therapy: Secondary | ICD-10-CM

## 2017-07-26 DIAGNOSIS — E11319 Type 2 diabetes mellitus with unspecified diabetic retinopathy without macular edema: Secondary | ICD-10-CM | POA: Diagnosis present

## 2017-07-26 DIAGNOSIS — E1122 Type 2 diabetes mellitus with diabetic chronic kidney disease: Secondary | ICD-10-CM | POA: Diagnosis present

## 2017-07-26 DIAGNOSIS — Z992 Dependence on renal dialysis: Secondary | ICD-10-CM | POA: Diagnosis not present

## 2017-07-26 DIAGNOSIS — I480 Paroxysmal atrial fibrillation: Secondary | ICD-10-CM

## 2017-07-26 DIAGNOSIS — I132 Hypertensive heart and chronic kidney disease with heart failure and with stage 5 chronic kidney disease, or end stage renal disease: Secondary | ICD-10-CM | POA: Diagnosis present

## 2017-07-26 DIAGNOSIS — R531 Weakness: Secondary | ICD-10-CM | POA: Diagnosis not present

## 2017-07-26 DIAGNOSIS — Z7982 Long term (current) use of aspirin: Secondary | ICD-10-CM

## 2017-07-26 DIAGNOSIS — R52 Pain, unspecified: Secondary | ICD-10-CM

## 2017-07-26 DIAGNOSIS — R1011 Right upper quadrant pain: Secondary | ICD-10-CM | POA: Diagnosis present

## 2017-07-26 DIAGNOSIS — Z794 Long term (current) use of insulin: Secondary | ICD-10-CM

## 2017-07-26 DIAGNOSIS — R570 Cardiogenic shock: Secondary | ICD-10-CM | POA: Diagnosis present

## 2017-07-26 DIAGNOSIS — N189 Chronic kidney disease, unspecified: Secondary | ICD-10-CM

## 2017-07-26 DIAGNOSIS — I3139 Other pericardial effusion (noninflammatory): Secondary | ICD-10-CM

## 2017-07-26 DIAGNOSIS — J90 Pleural effusion, not elsewhere classified: Secondary | ICD-10-CM | POA: Diagnosis not present

## 2017-07-26 DIAGNOSIS — F1320 Sedative, hypnotic or anxiolytic dependence, uncomplicated: Secondary | ICD-10-CM | POA: Diagnosis present

## 2017-07-26 DIAGNOSIS — K219 Gastro-esophageal reflux disease without esophagitis: Secondary | ICD-10-CM | POA: Diagnosis present

## 2017-07-26 DIAGNOSIS — E1151 Type 2 diabetes mellitus with diabetic peripheral angiopathy without gangrene: Secondary | ICD-10-CM | POA: Diagnosis present

## 2017-07-26 DIAGNOSIS — G510 Bell's palsy: Secondary | ICD-10-CM | POA: Diagnosis present

## 2017-07-26 DIAGNOSIS — N185 Chronic kidney disease, stage 5: Secondary | ICD-10-CM | POA: Diagnosis not present

## 2017-07-26 DIAGNOSIS — G8191 Hemiplegia, unspecified affecting right dominant side: Secondary | ICD-10-CM | POA: Diagnosis present

## 2017-07-26 HISTORY — PX: PERICARDIOCENTESIS: CATH118255

## 2017-07-26 LAB — GRAM STAIN

## 2017-07-26 LAB — POCT I-STAT, CHEM 8
BUN: 42 mg/dL — ABNORMAL HIGH (ref 6–20)
CREATININE: 5.5 mg/dL — AB (ref 0.44–1.00)
Calcium, Ion: 1.02 mmol/L — ABNORMAL LOW (ref 1.15–1.40)
Chloride: 93 mmol/L — ABNORMAL LOW (ref 101–111)
Glucose, Bld: 78 mg/dL (ref 65–99)
HEMATOCRIT: 30 % — AB (ref 36.0–46.0)
Hemoglobin: 10.2 g/dL — ABNORMAL LOW (ref 12.0–15.0)
POTASSIUM: 3.9 mmol/L (ref 3.5–5.1)
Sodium: 136 mmol/L (ref 135–145)
TCO2: 28 mmol/L (ref 22–32)

## 2017-07-26 LAB — GLUCOSE, CAPILLARY
GLUCOSE-CAPILLARY: 79 mg/dL (ref 65–99)
Glucose-Capillary: 379 mg/dL — ABNORMAL HIGH (ref 65–99)
Glucose-Capillary: 90 mg/dL (ref 65–99)

## 2017-07-26 LAB — CBC
HEMATOCRIT: 30.5 % — AB (ref 36.0–46.0)
Hemoglobin: 9.7 g/dL — ABNORMAL LOW (ref 12.0–15.0)
MCH: 27.5 pg (ref 26.0–34.0)
MCHC: 31.8 g/dL (ref 30.0–36.0)
MCV: 86.4 fL (ref 78.0–100.0)
Platelets: 497 10*3/uL — ABNORMAL HIGH (ref 150–400)
RBC: 3.53 MIL/uL — ABNORMAL LOW (ref 3.87–5.11)
RDW: 17.4 % — AB (ref 11.5–15.5)
WBC: 12.4 10*3/uL — AB (ref 4.0–10.5)

## 2017-07-26 LAB — COMPREHENSIVE METABOLIC PANEL
ALT: 25 U/L (ref 14–54)
ANION GAP: 22 — AB (ref 5–15)
AST: 42 U/L — ABNORMAL HIGH (ref 15–41)
Albumin: 2.9 g/dL — ABNORMAL LOW (ref 3.5–5.0)
Alkaline Phosphatase: 132 U/L — ABNORMAL HIGH (ref 38–126)
BUN: 35 mg/dL — ABNORMAL HIGH (ref 6–20)
CHLORIDE: 90 mmol/L — AB (ref 101–111)
CO2: 20 mmol/L — AB (ref 22–32)
Calcium: 8.6 mg/dL — ABNORMAL LOW (ref 8.9–10.3)
Creatinine, Ser: 5.09 mg/dL — ABNORMAL HIGH (ref 0.44–1.00)
GFR calc non Af Amer: 8 mL/min — ABNORMAL LOW (ref >60)
GFR, EST AFRICAN AMERICAN: 9 mL/min — AB (ref >60)
Glucose, Bld: 520 mg/dL (ref 65–99)
POTASSIUM: 4.5 mmol/L (ref 3.5–5.1)
SODIUM: 132 mmol/L — AB (ref 135–145)
Total Bilirubin: 0.6 mg/dL (ref 0.3–1.2)
Total Protein: 6.2 g/dL — ABNORMAL LOW (ref 6.5–8.1)

## 2017-07-26 LAB — CBC WITH DIFFERENTIAL/PLATELET
Basophils Absolute: 0 10*3/uL (ref 0.0–0.1)
Basophils Relative: 0 %
EOS ABS: 0 10*3/uL (ref 0.0–0.7)
EOS PCT: 0 %
HCT: 30.2 % — ABNORMAL LOW (ref 36.0–46.0)
Hemoglobin: 9.3 g/dL — ABNORMAL LOW (ref 12.0–15.0)
LYMPHS ABS: 2 10*3/uL (ref 0.7–4.0)
Lymphocytes Relative: 17 %
MCH: 27.1 pg (ref 26.0–34.0)
MCHC: 30.8 g/dL (ref 30.0–36.0)
MCV: 88 fL (ref 78.0–100.0)
MONOS PCT: 6 %
Monocytes Absolute: 0.7 10*3/uL (ref 0.1–1.0)
Neutro Abs: 8.6 10*3/uL — ABNORMAL HIGH (ref 1.7–7.7)
Neutrophils Relative %: 77 %
PLATELETS: 533 10*3/uL — AB (ref 150–400)
RBC: 3.43 MIL/uL — AB (ref 3.87–5.11)
RDW: 17.4 % — AB (ref 11.5–15.5)
WBC: 11.4 10*3/uL — AB (ref 4.0–10.5)

## 2017-07-26 LAB — LIPASE, BLOOD: Lipase: 24 U/L (ref 11–51)

## 2017-07-26 LAB — I-STAT CG4 LACTIC ACID, ED: Lactic Acid, Venous: 8.18 mmol/L (ref 0.5–1.9)

## 2017-07-26 LAB — BODY FLUID CELL COUNT WITH DIFFERENTIAL
Eos, Fluid: 1 %
Lymphs, Fluid: 3 %
Monocyte-Macrophage-Serous Fluid: 0 % — ABNORMAL LOW (ref 50–90)
Neutrophil Count, Fluid: 96 % — ABNORMAL HIGH (ref 0–25)
Total Nucleated Cell Count, Fluid: 1500 cu mm — ABNORMAL HIGH (ref 0–1000)

## 2017-07-26 LAB — CREATININE, SERUM
Creatinine, Ser: 5.16 mg/dL — ABNORMAL HIGH (ref 0.44–1.00)
GFR calc non Af Amer: 8 mL/min — ABNORMAL LOW (ref >60)
GFR, EST AFRICAN AMERICAN: 9 mL/min — AB (ref >60)

## 2017-07-26 LAB — ECHOCARDIOGRAM COMPLETE
Height: 66 in
Weight: 2465.62 oz

## 2017-07-26 LAB — MRSA PCR SCREENING: MRSA BY PCR: INVALID — AB

## 2017-07-26 LAB — LACTIC ACID, PLASMA: Lactic Acid, Venous: 8.2 mmol/L (ref 0.5–1.9)

## 2017-07-26 SURGERY — PERICARDIOCENTESIS
Anesthesia: LOCAL

## 2017-07-26 MED ORDER — HYDROCODONE-ACETAMINOPHEN 5-325 MG PO TABS: 1.0000 | ORAL_TABLET | ORAL | Status: DC | PRN

## 2017-07-26 MED ORDER — SODIUM CHLORIDE 0.9% FLUSH
3.0000 mL | Freq: Two times a day (BID) | INTRAVENOUS | Status: DC
  Administered 2017-07-26: 3 mL via INTRAVENOUS

## 2017-07-26 MED ORDER — SODIUM CHLORIDE 0.9 % IV SOLN: INTRAVENOUS | Status: DC

## 2017-07-26 MED ORDER — SODIUM CHLORIDE 0.9 % IV BOLUS (SEPSIS): 1000.0000 mL | Freq: Once | INTRAVENOUS | Status: DC

## 2017-07-26 MED ORDER — SODIUM CHLORIDE 0.9 % IV BOLUS (SEPSIS)
1000.0000 mL | Freq: Once | INTRAVENOUS | Status: AC
  Administered 2017-07-26: 1000 mL via INTRAVENOUS

## 2017-07-26 MED ORDER — ONDANSETRON HCL 4 MG/2ML IJ SOLN
4.0000 mg | Freq: Once | INTRAMUSCULAR | Status: AC
  Administered 2017-07-26: 4 mg via INTRAVENOUS
  Filled 2017-07-26: qty 2

## 2017-07-26 MED ORDER — SODIUM CHLORIDE 0.9 % IV SOLN: 250.0000 mL | INTRAVENOUS | Status: DC | PRN

## 2017-07-26 MED ORDER — ORAL CARE MOUTH RINSE
15.0000 mL | Freq: Two times a day (BID) | OROMUCOSAL | Status: DC
  Administered 2017-07-26 – 2017-08-01 (×10): 15 mL via OROMUCOSAL

## 2017-07-26 MED ORDER — SODIUM CHLORIDE 0.9 % IV SOLN
INTRAVENOUS | Status: DC
Start: 2017-07-26 — End: 2017-07-26

## 2017-07-26 MED ORDER — SODIUM CHLORIDE 0.9 % IV SOLN
1500.0000 mg | Freq: Once | INTRAVENOUS | Status: AC
  Administered 2017-07-26: 1500 mg via INTRAVENOUS
  Filled 2017-07-26: qty 1500

## 2017-07-26 MED ORDER — GABAPENTIN 100 MG PO CAPS: 100.0000 mg | ORAL_CAPSULE | Freq: Three times a day (TID) | ORAL | Status: DC

## 2017-07-26 MED ORDER — INSULIN GLARGINE 100 UNIT/ML ~~LOC~~ SOLN
10.0000 [IU] | Freq: Every day | SUBCUTANEOUS | Status: DC
  Filled 2017-07-26: qty 0.1

## 2017-07-26 MED ORDER — HEPARIN (PORCINE) IN NACL 2-0.9 UNIT/ML-% IJ SOLN
INTRAMUSCULAR | Status: AC | PRN
  Administered 2017-07-26: 500 mL

## 2017-07-26 MED ORDER — LORAZEPAM 2 MG/ML IJ SOLN
INTRAMUSCULAR | Status: AC
  Filled 2017-07-26: qty 1

## 2017-07-26 MED ORDER — ONDANSETRON HCL 4 MG/2ML IJ SOLN
4.0000 mg | Freq: Four times a day (QID) | INTRAMUSCULAR | Status: DC | PRN
  Administered 2017-07-29 – 2017-07-31 (×2): 4 mg via INTRAVENOUS
  Filled 2017-07-26 (×2): qty 2

## 2017-07-26 MED ORDER — ONDANSETRON HCL 4 MG/2ML IJ SOLN: 4.0000 mg | Freq: Four times a day (QID) | INTRAMUSCULAR | Status: DC | PRN

## 2017-07-26 MED ORDER — LIDOCAINE HCL (PF) 1 % IJ SOLN
INTRAMUSCULAR | Status: DC | PRN
  Administered 2017-07-26: 20 mL

## 2017-07-26 MED ORDER — SODIUM CHLORIDE 0.9% FLUSH: 3.0000 mL | INTRAVENOUS | Status: DC | PRN

## 2017-07-26 MED ORDER — AMIODARONE LOAD VIA INFUSION
150.0000 mg | Freq: Once | INTRAVENOUS | Status: AC
  Administered 2017-07-26: 150 mg via INTRAVENOUS

## 2017-07-26 MED ORDER — AMIODARONE HCL IN DEXTROSE 360-4.14 MG/200ML-% IV SOLN
30.0000 mg/h | INTRAVENOUS | Status: DC
  Administered 2017-07-27: 30 mg/h via INTRAVENOUS
  Filled 2017-07-26: qty 200

## 2017-07-26 MED ORDER — AMIODARONE HCL IN DEXTROSE 360-4.14 MG/200ML-% IV SOLN
INTRAVENOUS | Status: AC
  Administered 2017-07-26: 150 mg via INTRAVENOUS
  Filled 2017-07-26: qty 200

## 2017-07-26 MED ORDER — ACETAMINOPHEN 325 MG PO TABS
650.0000 mg | ORAL_TABLET | ORAL | Status: DC | PRN
  Administered 2017-07-27 – 2017-07-30 (×3): 650 mg via ORAL
  Filled 2017-07-26 (×4): qty 2

## 2017-07-26 MED ORDER — SODIUM CHLORIDE 0.9 % IV BOLUS (SEPSIS)
250.0000 mL | Freq: Once | INTRAVENOUS | Status: AC
Start: 2017-07-26 — End: 2017-07-26
  Administered 2017-07-26: 500 mL via INTRAVENOUS

## 2017-07-26 MED ORDER — PIPERACILLIN-TAZOBACTAM 3.375 G IVPB 30 MIN
3.3750 g | Freq: Once | INTRAVENOUS | Status: AC
  Administered 2017-07-26: 3.375 g via INTRAVENOUS
  Filled 2017-07-26: qty 50

## 2017-07-26 MED ORDER — SODIUM CHLORIDE 0.9 % IV BOLUS (SEPSIS)
250.0000 mL | Freq: Once | INTRAVENOUS | Status: AC
  Administered 2017-07-26: 250 mL via INTRAVENOUS

## 2017-07-26 MED ORDER — INSULIN ASPART 100 UNIT/ML ~~LOC~~ SOLN
0.0000 [IU] | SUBCUTANEOUS | Status: DC
  Administered 2017-07-26: 11 [IU] via SUBCUTANEOUS
  Administered 2017-07-26: 15 [IU] via SUBCUTANEOUS
  Administered 2017-07-27: 3 [IU] via SUBCUTANEOUS

## 2017-07-26 MED ORDER — AMIODARONE HCL IN DEXTROSE 360-4.14 MG/200ML-% IV SOLN
60.0000 mg/h | INTRAVENOUS | Status: AC
  Administered 2017-07-26 (×2): 60 mg/h via INTRAVENOUS

## 2017-07-26 MED ORDER — LORAZEPAM 2 MG/ML IJ SOLN
0.5000 mg | INTRAMUSCULAR | Status: DC | PRN
  Administered 2017-07-26: 0.5 mg via INTRAVENOUS

## 2017-07-26 MED ORDER — SODIUM CHLORIDE 0.9 % IV SOLN: 3.0000 g | INTRAVENOUS | Status: DC

## 2017-07-26 MED ORDER — SODIUM CHLORIDE 0.9 % IV SOLN
3.0000 g | INTRAVENOUS | Status: DC
  Administered 2017-07-26: 3 g via INTRAVENOUS
  Filled 2017-07-26: qty 3

## 2017-07-26 MED ORDER — LIDOCAINE HCL (PF) 1 % IJ SOLN
INTRAMUSCULAR | Status: AC
  Filled 2017-07-26: qty 30

## 2017-07-26 MED ORDER — HEPARIN SODIUM (PORCINE) 5000 UNIT/ML IJ SOLN: 5000.0000 [IU] | Freq: Three times a day (TID) | INTRAMUSCULAR | Status: DC

## 2017-07-26 MED ORDER — HEPARIN (PORCINE) IN NACL 2-0.9 UNIT/ML-% IJ SOLN
INTRAMUSCULAR | Status: AC
  Filled 2017-07-26: qty 500

## 2017-07-26 MED ORDER — PANTOPRAZOLE SODIUM 40 MG PO TBEC: 40.0000 mg | DELAYED_RELEASE_TABLET | Freq: Every day | ORAL | Status: DC

## 2017-07-26 MED ORDER — HEPARIN SODIUM (PORCINE) 5000 UNIT/ML IJ SOLN
5000.0000 [IU] | Freq: Three times a day (TID) | INTRAMUSCULAR | Status: DC
Start: 2017-07-26 — End: 2017-08-01
  Administered 2017-07-26 – 2017-08-01 (×19): 5000 [IU] via SUBCUTANEOUS
  Filled 2017-07-26 (×19): qty 1

## 2017-07-26 MED ORDER — FAMOTIDINE 20 MG PO TABS: 20.0000 mg | ORAL_TABLET | Freq: Every day | ORAL | Status: DC

## 2017-07-26 MED ORDER — ACETAMINOPHEN 325 MG PO TABS: 650.0000 mg | ORAL_TABLET | ORAL | Status: DC | PRN

## 2017-07-26 MED ORDER — VANCOMYCIN HCL IN DEXTROSE 1-5 GM/200ML-% IV SOLN
1000.0000 mg | Freq: Once | INTRAVENOUS | Status: DC
  Filled 2017-07-26: qty 200

## 2017-07-26 SURGICAL SUPPLY — 7 items
EVACUATOR 1/8 PVC DRAIN (DRAIN) ×2 IMPLANT
PERIVAC PERICARDIOCENTESIS 8.3 (TRAY / TRAY PROCEDURE) ×2 IMPLANT
PROTECTION STATION PRESSURIZED (MISCELLANEOUS) ×2
STATION PROTECTION PRESSURIZED (MISCELLANEOUS) ×1 IMPLANT
TRANSDUCER W/STOPCOCK (MISCELLANEOUS) ×2 IMPLANT
TUBING ART PRESS 72  MALE/FEM (TUBING) ×1
TUBING ART PRESS 72 MALE/FEM (TUBING) ×1 IMPLANT

## 2017-07-26 NOTE — ED Provider Notes (Signed)
[facility]-EMERGENCY DEPT Provider Note   CSN: [id number] Arrival date & time: 07/26/17  0731     History   Chief Complaint Chief Complaint  Patient presents with  . Chest Pain    HPI Colleen Mullins is a 71 y.o. female.  71 year old female presents with worsening chronic right-sided chest discomfort which began this morning. States that she's had pain like this and she fell back in April of this year. Pain characterizes sharp and located on the anterior chest and worsening movement. Denies dyspnea. Does have a history of incisional disease and was last dialyzed yesterday and was noted to have some hypotension during dialysis. She has had nonbilious emesis and denies any fever or chills. No bloody stools. Called EMS and was transported here      Past Medical History:  Diagnosis Date  . Abnormal nuclear stress test 07/10/2016  . Anemia    low iron  . Anemia in chronic renal disease 07/08/2016  . Arthritis   . CAD in native artery 08/12/2016   a. Nonobstructive ASCAD by cath with 60% D1 and 50% PL off RCA and 25% mid LAD.  . Diabetes mellitus    type 2  . Diabetic retinopathy (HCC)   . Dialysis patient [facility](HCC)    M-W-F @ [facility]  . Diastolic dysfunction   . Dyspnea on exertion 04/14/2016  . Ejection fraction   . Elevated troponin 10/16/2015  . ESRD on dialysis (HCC) 04/14/2016  . ESRD on hemodialysis [facility](HCC)    Dialysis T/Th/Sa  . GERD (gastroesophageal reflux disease)   . Glaucoma   . H/O: Bell's palsy 2007  . Hyperlipidemia   . Hypertension    a. labile - hx of BP dropping at HD.  . Macular degeneration   . Musculoskeletal chest pain   . PAD (peripheral artery disease) (HCC)    a. ABI 08/2016: mod RLE disease, normal L ABI.  Marland Kitchen. 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 PVD (peripheral vascular disease) (HCC) 03/18/2017  . Type 2 diabetes mellitus with renal complication (HCC) 09/26/2011    Patient Active Problem List   Diagnosis Date Noted  . Acute on  chronic respiratory failure with hypoxia (HCC) 06/14/2017  . Acute on chronic diastolic (congestive) heart failure (HCC) 06/14/2017  . Chest wall pain 06/14/2017  . Aspiration pneumonia (HCC) 06/14/2017  . Right shoulder pain 06/14/2017  . GERD (gastroesophageal reflux disease) 06/14/2017  . PVD (peripheral vascular disease) (HCC) 03/18/2017  . CAD in native artery 08/12/2016  . Abnormal nuclear stress test 07/10/2016  . Musculoskeletal chest pain   . Anemia in chronic renal disease 07/08/2016  . Dyspnea on exertion 04/14/2016  . ESRD on dialysis (HCC) 04/14/2016  . Hypertensive heart disease with congestive heart failure (HCC) 04/14/2016  . Hyperlipidemia 04/14/2016  . 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 Hypoglycemia 10/16/2015  . Elevated troponin 10/16/2015  . Ejection fraction   . Type 2 diabetes mellitus with renal complication (HCC) 09/26/2011  . Other complications due to renal dialysis device, implant, and graft 09/26/2011    Past Surgical History:  Procedure Laterality Date  . ABDOMINAL HYSTERECTOMY    . ARTERIOVENOUS GRAFT PLACEMENT     Left forearm x 2, one removed in March  . ARTERIOVENOUS GRAFT PLACEMENT     Left forearm  . AVGG REMOVAL  10/08/2011   Procedure: REMOVAL OF ARTERIOVENOUS GORETEX GRAFT (AVGG);  Surgeon: Sherren Kernsharles E Fields, MD;  [facility];  Service: Vascular;  Laterality: Left;  . BTL    .  CARDIAC CATHETERIZATION N/A 07/10/2016   Procedure: Left Heart Cath and Coronary Angiography;  Surgeon: Yvonne Kendall, MD;  Location: [facility] INVASIVE CV LAB;  Service: Cardiovascular;  Laterality: N/A;  . COLONOSCOPY    . EXCHANGE OF A DIALYSIS CATHETER Right 03/27/2016   Procedure: EXCHANGE OF A DIALYSIS CATHETER;  Surgeon: Fransisco Hertz, MD;  Location: [facility] OR;  Service: Vascular;  Laterality: Right;  . EYE SURGERY Left    Lasik surgery on left. Cataract surgery on both eyes  . LAPAROTOMY  ?2000   Pelvic mass (Benign)  . REVISION OF  ARTERIOVENOUS GORETEX GRAFT Left 03/27/2016   Procedure: REVISION OF LEFT ARTERIOVENOUS GORETEX GRAFT;  Surgeon: Fransisco Hertz, MD;  Location: [facility] OR;  Service: Vascular;  Laterality: Left;  . TONSILLECTOMY      OB History    No data available       Home Medications    Prior to Admission medications   Medication Sig Start Date End Date Taking? Authorizing Provider  acetaminophen (TYLENOL) 650 MG CR tablet Take 650 mg by mouth every 8 (eight) hours as needed for pain.     [provider]  amLODipine (NORVASC) 10 MG tablet Take 10 mg by mouth at bedtime.     [provider]  aspirin EC 81 MG tablet Take 81 mg by mouth at bedtime.     [provider]  B Complex-C-Folic Acid (RENAL-VITE) 0.8 MG TABS Take 1 tablet by mouth every evening. Vitamin B complex-vitamin C folic acid    [provider]  calcium acetate (PHOSLO) 667 MG capsule Take 667-1,334 mg by mouth See admin instructions. Take 2 capsules (1334 mg) by mouth 3 times daily with meals and 1 capsule (667 mg) with snacks    [provider]  cinacalcet (SENSIPAR) 60 MG tablet Take 60 mg by mouth daily. Monday, Wednesday and Friday    [provider]  cyclobenzaprine (FLEXERIL) 5 MG tablet Take 5 mg by mouth 3 (three) times daily as needed for muscle spasms.     [provider]  diazepam (VALIUM) 5 MG tablet Take 5 mg by mouth every 6 (six) hours as needed for anxiety.    [provider]  esomeprazole (NEXIUM) 40 MG capsule Take 40 mg by mouth at bedtime. 03/11/17   [provider]  ethyl chloride spray Apply 1 application topically daily as needed (dialysis). For use at dialysis     [provider]  gabapentin (NEURONTIN) 100 MG capsule Take 1 capsule (100 mg total) by mouth 3 (three) times daily. 12/30/16   Joseph Art, DO  HYDROcodone-acetaminophen (NORCO) 5-325 MG tablet Take 1 tablet by mouth every 4 (four) hours as needed for moderate pain. 06/30/17    Tegeler, Canary Brim, MD  insulin aspart (NOVOLOG) 100 UNIT/ML injection Inject 0-15 Units into the skin 3 (three) times daily with meals. Inject three times a day before meals  70-120 = 0 units 121-150 =2 units 151-200 - 3 units 201-250 = 5 units 251-300 = 8 units 301-350 = 11 units 351-400 = 15 units Greater than 400 call provider and give 15 units    [provider]  labetalol (NORMODYNE) 300 MG tablet Take 300 mg by mouth See admin instructions. Take 1 tablet (300 mg) by mouth twice daily - at noon and at bedtime 10/01/16   [provider]  losartan (COZAAR) 100 MG tablet Take 100 mg by mouth at bedtime.     [provider]  Nutritional Supplements (FEEDING SUPPLEMENT, NEPRO CARB STEADY,) LIQD Take 237 mLs by mouth 2 (two) times daily between meals.    [provider]  ondansetron (ZOFRAN) 4 MG tablet Take 1 tablet (4 mg total) by mouth every 8 (eight) hours as needed for nausea or vomiting. 06/30/17   Tegeler, Canary Brim, MD  OXYGEN Inhale into the lungs as needed (shortness of breath).    [provider]  ranitidine (ZANTAC) 75 MG tablet Take 75 mg by mouth every evening.     [provider]  Travoprost, BAK Free, (TRAVATAN) 0.004 % SOLN ophthalmic solution Place 1 drop into both eyes at bedtime.    [provider]    Family History Family History  Problem Relation Age of Onset  . Diabetes Mother   . Heart disease Mother   . COPD Father   . Hypertension Father   . Lung disease Father   . Diabetes Father   . Diabetes Sister   . Breast cancer Sister     Social History Social History  Substance Use Topics  . Smoking status: Former Smoker    Types: Cigarettes    Quit date: 08/04/1999  . Smokeless tobacco: Never Used  . Alcohol use No     Allergies   Iodinated diagnostic agents and Lactose intolerance (gi)   Review of Systems Review of Systems  All other systems reviewed and are negative.    Physical  Exam Updated Vital Signs BP (!) 80/67 (BP Location: Left Arm)   Temp (!) 97.4 F (36.3 C) (Oral)   Resp (!) 22   Ht 1.676 m (5\' 6" )   Wt 69.9 kg (154 lb 1.6 oz)   SpO2 99%   BMI 24.87 kg/m   Physical Exam  Constitutional: She is oriented to person, place, and time. She appears well-developed and well-nourished.  Non-toxic appearance. No distress.  HENT:  Head: Normocephalic and atraumatic.  Eyes: Pupils are equal, round, and reactive to light. Conjunctivae, EOM and lids are normal.  Neck: Normal range of motion. Neck supple. No tracheal deviation present. No thyroid mass present.  Cardiovascular: An irregularly irregular rhythm present. Tachycardia present.  Exam reveals distant heart sounds. Exam reveals no gallop.   No murmur heard. Pulmonary/Chest: Effort normal and breath sounds normal. No stridor. No respiratory distress. She has no decreased breath sounds. She has no wheezes. She has no rhonchi. She has no rales. She exhibits tenderness.    Abdominal: Soft. Normal appearance and bowel sounds are normal. She exhibits no distension. There is tenderness in the right upper quadrant. There is guarding. There is no rigidity, no rebound and no CVA tenderness.    Musculoskeletal: Normal range of motion. She exhibits no edema or tenderness.  Neurological: She is alert and oriented to person, place, and time. She has normal strength. No cranial nerve deficit or sensory deficit. GCS eye subscore is 4. GCS verbal subscore is 5. GCS motor subscore is 6.  Skin: Skin is warm and dry. No abrasion and no rash noted.  Psychiatric: Her mood appears anxious. Her speech is delayed.  Nursing note and vitals reviewed.    ED Treatments / Results  Labs (all labs ordered are listed, but only abnormal results are displayed) Labs Reviewed  CBC WITH DIFFERENTIAL/PLATELET  COMPREHENSIVE METABOLIC PANEL  LIPASE, BLOOD  I-STAT CG4 LACTIC ACID, ED    EKG  EKG Interpretation None      Patient  with severe right upper quadrant and right chest pain with associated  tachycardia which looks to be in new onset A. fib. Chest distant heart sounds. Chest x-ray shows enlarged heart however patient has severe pain in her right upper quadrant. Concern for cardiac tamponade versus acute cholecystitis. Patient's initial lactate was very elevated at 8. She was started on empiric antibiotics. She is hypotensive and treated with IV hydration. Noncontrast CT which showed thickened gallbladder wall but also showed evidence of a pericardial effusion. Discuss with critical care and as well as cardiology. This ultrasound shows evidence of temporal not. Blood pressure did improve with IV fluids. Patient be taken to catheterization lab for pericardiocentesis.   CRITICAL CARE Performed by: Toy Baker Total critical care time: 75 minutes Critical care time was exclusive of separately billable procedures and treating other patients. Critical care was necessary to treat or prevent imminent or life-threatening deterioration. Critical care was time spent personally by me on the following activities: development of treatment plan with patient and/or surrogate as well as nursing, discussions with consultants, evaluation of patient's response to treatment, examination of patient, obtaining history from patient or surrogate, ordering and performing treatments and interventions, ordering and review of laboratory studies, ordering and review of radiographic studies, pulse oximetry and re-evaluation of patient's condition.  Radiology No results found.  Procedures Procedures (including critical care time)  Medications Ordered in ED Medications  0.9 %  sodium chloride infusion (not administered)  sodium chloride 0.9 % bolus 250 mL (not administered)  0.9 %  sodium chloride infusion (not administered)     Initial Impression / Assessment and Plan / ED Course  I have reviewed the triage vital signs and the nursing  notes.  Pertinent labs & imaging results that were available during my care of the patient were reviewed by me and considered in my medical decision making (see chart for details).       Final Clinical Impressions(s) / ED Diagnoses   Final diagnoses:  None    New Prescriptions New Prescriptions   No medications on file     Lorre Nick, MD 07/26/17 1024

## 2017-07-26 NOTE — ED Triage Notes (Signed)
Grandaughter  Called to report Pt admission.

## 2017-07-26 NOTE — ED Triage Notes (Signed)
US sound at bedside

## 2017-07-26 NOTE — ED Notes (Signed)
Informed Dr. Freida BusmanAllen of lactic acid result of 8.18

## 2017-07-26 NOTE — Progress Notes (Signed)
eLink Physician-Brief Progress Note Patient Name: Rennie PlowmanDianne Vessey DOB: 03/04/1946 MRN: [id number]   Date of Service  07/26/2017  HPI/Events of Note  Persistent hyperglycemia with end-stage renal disease. Glucose persistently over 300. Currently on subcutaneous insulin with moderate sliding scale.   eICU Interventions  1. Continuing Accu-Cheks every 4 hours with moderate sliding scale insulin algorithm 2. Starting Lantus 10 units subcutaneous daily at 6 PM starting today      Intervention Category Major Interventions: Hyperglycemia - active titration of insulin therapy  Lawanda CousinsJennings Monifa Blanchette 07/26/2017, 6:59 PM

## 2017-07-26 NOTE — Consult Note (Addendum)
Primary cardiologist: Dr Radford Pax  HPI: 71 year old female with past medical history of diabetes mellitus, peripheral vascular disease, hypertension, hyperlipidemia, end-stage renal disease dialysis dependent, coronary artery disease for evaluation of pericardial effusion/Benign at request of Dr. Lake Bells. Last cardiac catheterization performed in August 2017 showed a 60% first diagonal and a 50% posterior lateral. Echocardiogram August 2017 showed normal LV systolic function and grade 2 diastolic dysfunction and moderate left atrial enlargement. Patient presents with complaints of right sided chest pain described as sharp. It does not radiate. It increases with cough and certain movements. She also describes right lower quadrant pain. In the emergency room she was noted to be hypotensive with a systolic blood pressure of 80. She was also a new onset atrial fibrillation with a rate of 130. Abdominal CT was performed and showed large cardiac effusion. Cardiology is now asked to evaluate. Note history is difficult as patient is writhing in pain.   (Not in a hospital admission)  Allergies  Allergen Reactions  . Iodinated Diagnostic Agents Hives and Itching  . Lactose Intolerance (Gi) Other (See Comments)    Abdominal pains and bloating    Past Medical History:  Diagnosis Date  . Abnormal nuclear stress test 07/10/2016  . Anemia    low iron  . Anemia in chronic renal disease 07/08/2016  . Arthritis   . CAD in native artery 08/12/2016   a. Nonobstructive ASCAD by cath with 60% D1 and 50% PL off RCA and 25% mid LAD.  . Diabetes mellitus    type 2  . Diabetic retinopathy ([place])   . Dialysis patient [facility])    M-W-F @ [facility]  . Diastolic dysfunction   . Dyspnea on exertion 04/14/2016  . Ejection fraction   . Elevated troponin 10/16/2015  . ESRD on dialysis ([place]) 04/14/2016  . ESRD on hemodialysis [facility])    Dialysis T/Th/Sa  . GERD (gastroesophageal reflux disease)   . Glaucoma   . H/O:  Bell's palsy 2007  . Hyperlipidemia   . Hypertension    a. labile - hx of BP dropping at HD.  . Macular degeneration   . Musculoskeletal chest pain   . PAD (peripheral artery disease) ([place])    a. ABI 08/2016: mod RLE disease, normal L ABI.  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 PVD (peripheral vascular disease) ([place]) 03/18/2017  . Type 2 diabetes mellitus with renal complication ([place]) 62/06/3150    Past Surgical History:  Procedure Laterality Date  . ABDOMINAL HYSTERECTOMY    . ARTERIOVENOUS GRAFT PLACEMENT     Left forearm x 2, one removed in March  . ARTERIOVENOUS GRAFT PLACEMENT     Left forearm  . [place] REMOVAL  10/08/2011   Procedure: REMOVAL OF ARTERIOVENOUS GORETEX GRAFT ([place]);  Surgeon: Elam Dutch, MD;  Location: [place];  Service: Vascular;  Laterality: Left;  . BTL    . CARDIAC CATHETERIZATION N/A 07/10/2016   Procedure: Left Heart Cath and Coronary Angiography;  Surgeon: Nelva Bush, MD;  Location: [place] CV LAB;  Service: Cardiovascular;  Laterality: N/A;  . COLONOSCOPY    . EXCHANGE OF A DIALYSIS CATHETER Right 03/27/2016   Procedure: EXCHANGE OF A DIALYSIS CATHETER;  Surgeon: Conrad Kings Mills, MD;  Location: [place];  Service: Vascular;  Laterality: Right;  . EYE SURGERY Left    Lasik surgery on left. Cataract surgery on both eyes  . LAPAROTOMY  ?2000   Pelvic mass (Benign)  . REVISION OF ARTERIOVENOUS GORETEX GRAFT  Left 03/27/2016   Procedure: REVISION OF LEFT ARTERIOVENOUS GORETEX GRAFT;  Surgeon: Conrad Lavelle, MD;  Location: [place];  Service: Vascular;  Laterality: Left;  . TONSILLECTOMY      Social History   Social History  . Marital status: Widowed    Spouse name: N/A  . Number of children: N/A  . Years of education: N/A   Occupational History  . retired [employer] photographer    Social History Main Topics  . Smoking status: Former Smoker    Types: Cigarettes    Quit date: 08/04/1999  . Smokeless tobacco: Never Used  . Alcohol use No  .  Drug use: No  . Sexual activity: No   Other Topics Concern  . Not on file   Social History Narrative   Admitted to [facility] 06/12/17   Widowed   Former smoker-stopped 2000   Alcohol none   Full Code    Family History  Problem Relation Age of Onset  . Diabetes Mother   . Heart disease Mother   . COPD Father   . Hypertension Father   . Lung disease Father   . Diabetes Father   . Diabetes Sister   . Breast cancer Sister     ROS:  Patient complains of chest and abdominal pain butno fevers or chills, productive cough, hemoptysis, dysphasia, odynophagia, melena, hematochezia, dysuria, hematuria, rash, seizure activity, orthopnea, PND, pedal edema, claudication. Remaining systems are negative.  Physical Exam:   Blood pressure 100/75, pulse (!) 124, temperature 98.5 F (36.9 C), temperature source Rectal, resp. rate (!) 33, height '5\' 6"'  (1.676 m), weight 69.9 kg (154 lb 1.6 oz), SpO2 96 %.  General:  Well developed/chronically ill appearing; writhing in pain Skin warm/dry No peripheral clubbing HEENT-normal/normal eyelids Neck supple/normal carotid upstroke bilaterally; no bruits; no JVD; no thyromegaly chest - CTA/ normal expansion CV - irregular and tachycardic, no murmur Abdomen -tender, soft 2+ femoral pulses Ext-no edema Neuro-grossly nonfocal  ECG -atrial fibrillation with rapid ventricular response and nonspecific ST changes.personally reviewed  Results for orders placed or performed during the hospital encounter of 07/26/17 (from the past 48 hour(s))  CBC with Differential/Platelet     Status: Abnormal   Collection Time: 07/26/17  8:18 AM  Result Value Ref Range   WBC 11.4 (H) 4.0 - 10.5 K/uL   RBC 3.43 (L) 3.87 - 5.11 MIL/uL   Hemoglobin 9.3 (L) 12.0 - 15.0 g/dL   HCT 30.2 (L) 36.0 - 46.0 %   MCV 88.0 78.0 - 100.0 fL   MCH 27.1 26.0 - 34.0 pg   MCHC 30.8 30.0 - 36.0 g/dL   RDW 17.4 (H) 11.5 - 15.5 %   Platelets 533 (H) 150 - 400 K/uL    Neutrophils Relative % 77 %   Neutro Abs 8.6 (H) 1.7 - 7.7 K/uL   Lymphocytes Relative 17 %   Lymphs Abs 2.0 0.7 - 4.0 K/uL   Monocytes Relative 6 %   Monocytes Absolute 0.7 0.1 - 1.0 K/uL   Eosinophils Relative 0 %   Eosinophils Absolute 0.0 0.0 - 0.7 K/uL   Basophils Relative 0 %   Basophils Absolute 0.0 0.0 - 0.1 K/uL  Comprehensive metabolic panel     Status: Abnormal   Collection Time: 07/26/17  8:18 AM  Result Value Ref Range   Sodium 132 (L) 135 - 145 mmol/L   Potassium 4.5 3.5 - 5.1 mmol/L   Chloride 90 (L) 101 - 111 mmol/L   CO2  20 (L) 22 - 32 mmol/L   Glucose, Bld 520 (HH) 65 - 99 mg/dL    Comment: CRITICAL RESULT CALLED TO, READ BACK BY AND VERIFIED WITH: A.MCKEOWN RN @ [phone] 07/26/17 BY C.EDENS    BUN 35 (H) 6 - 20 mg/dL   Creatinine, Ser 5.09 (H) 0.44 - 1.00 mg/dL   Calcium 8.6 (L) 8.9 - 10.3 mg/dL   Total Protein 6.2 (L) 6.5 - 8.1 g/dL   Albumin 2.9 (L) 3.5 - 5.0 g/dL   AST 42 (H) 15 - 41 U/L   ALT 25 14 - 54 U/L   Alkaline Phosphatase 132 (H) 38 - 126 U/L   Total Bilirubin 0.6 0.3 - 1.2 mg/dL   GFR calc non Af Amer 8 (L) >60 mL/min   GFR calc Af Amer 9 (L) >60 mL/min    Comment: (NOTE) The eGFR has been calculated using the CKD EPI equation. This calculation has not been validated in all clinical situations. eGFR's persistently <60 mL/min signify possible Chronic Kidney Disease.    Anion gap 22 (H) 5 - 15  Lipase, blood     Status: None   Collection Time: 07/26/17  8:18 AM  Result Value Ref Range   Lipase 24 11 - 51 U/L  I-Stat CG4 Lactic Acid, ED     Status: Abnormal   Collection Time: 07/26/17  8:31 AM  Result Value Ref Range   Lactic Acid, Venous 8.18 (HH) 0.5 - 1.9 mmol/L   Comment NOTIFIED PHYSICIAN   Lactic acid, plasma     Status: Abnormal   Collection Time: 07/26/17  9:30 AM  Result Value Ref Range   Lactic Acid, Venous 8.2 (HH) 0.5 - 1.9 mmol/L    Comment: CRITICAL RESULT CALLED TO, READ BACK BY AND VERIFIED WITH: Sharyn Dross RN AT 1024  07/26/17 BY WOOLLENK     Ct Abdomen Pelvis Wo Contrast  Result Date: 07/26/2017 CLINICAL DATA:  Mid abdominal pain with nausea and vomiting. End-stage renal disease, dialysis dependent. EXAM: CT ABDOMEN AND PELVIS WITHOUT CONTRAST TECHNIQUE: Multidetector CT imaging of the abdomen and pelvis was performed following the standard protocol without IV contrast. COMPARISON:  07/26/2017 chest x-ray 07/30/2015 CT without contrast FINDINGS: Lower chest: Large pericardial effusion. Small pleural effusions, slightly larger on the left. Aorta is atherosclerotic. Streaky bibasilar atelectasis. Degenerative changes of the lower thoracic spine. Hepatobiliary: Limited assessment without IV contrast. Diffuse periportal edema evident with small amount of perihepatic ascites on the right. Similar pattern of scattered small indeterminate hepatic hypodensities all measuring 10 mm or less in size, suspect small scattered hepatic cysts. Gallbladder demonstrates wall thickening and pericholecystic fluid. No surrounding inflammation. However, difficult to exclude cholecystitis. Consider correlation with ultrasound. No gross biliary obstruction. Gallbladder findings can also be seen with chronic hepatic disease. Pancreas: Limited assess without contrast. No large focal abnormality or ductal dilatation. No surrounding fluid collection or inflammation. Spleen: Normal in size without focal abnormality. Adrenals/Urinary Tract: Normal adrenal glands for age. Kidneys demonstrate chronic perinephric strandy edema. Nonobstructing intrarenal calculi bilaterally in the lower poles. Renal vascular calcifications also evident. No acute obstructive uropathy or hydronephrosis. Negative for hydroureter or ureteral obstruction. Urinary bladder collapsed. Stomach/Bowel: Negative for bowel obstruction, significant dilatation, ileus, or free air. Scattered colonic diverticulosis. Appendix is unremarkable. No fluid collection or abscess. Moderate stool  burden throughout the colon. Vascular/Lymphatic: Extensive calcific atherosclerosis of the aorta and iliac vessels. No retroperitoneal hemorrhage or hematoma. Left femoral access graft noted. On the right, the right internal iliac artery  continues as a persistent right sciatic artery, normal arterial variant anatomy. Reproductive: Remote hysterectomy. No adnexal mass or abnormality. No fluid collection or abscess. Other: No inguinal or abdominal wall hernia. Musculoskeletal: Degenerative changes of the spine SI joints. Vacuum disc phenomena at L4-5 and L5-S1. No acute osseous finding. IMPRESSION: Large pericardial effusion. Small pleural effusions with bibasilar atelectasis Periportal hepatic edema and small amount of perihepatic ascites with probable scattered small hepatic cysts. No biliary obstruction. Gallbladder wall thickening suspected with pericholecystic fluid. Difficult to exclude cholecystitis. Consider ultrasound correlation. Gallbladder findings can also be seen with chronic hepatic disease. Nonobstructing nephrolithiasis Extensive aortoiliac atherosclerosis Persistent right sciatic artery noted, normal vascular variant. Electronically Signed   By: Jerilynn Mages.  Shick M.D.   On: 07/26/2017 09:28   Dg Chest Port 1 View  Result Date: 07/26/2017 CLINICAL DATA:  RIGHT chest pain. Dialysis patient. Pt is a dialysis pt who is supposed to have dialysis tomorrow. Pt reports that her dialysis port is bleeding and it was not bleeding after dialysis Wednesday, states entire right side is hurting, right chest area. Hx of CAD. Diabetes. HTN, PAD, DM2 EXAM: PORTABLE CHEST 1 VIEW COMPARISON:  06/29/2017 FINDINGS: Cardiac silhouette is enlarged. Silhouette is enlarged compared to 06/29/2017. No effusion or infiltrate. No pulmonary edema. No pneumothorax for IMPRESSION: Increase cardiac silhouette compared to 06/29/2017. Differential includes cardiomegaly versus pericardial effusion. Electronically Signed   By: Suzy Bouchard M.D.   On: 07/26/2017 08:37    Assessment/Plan  1 Pericardial effusion/tamponade-I have preliminarily reviewed echocardiogram. LV function appears to be normal. There is a large pericardial effusion with RV collapse. The patient is hypotensive and I therefore feel urgent pericardiocentesis is indicated. We have activated the cath lab emergently. Etiology of pericardial effusion unclear. We will send for cultures and cytology. She states she has not missed dialysis.  2 new onset atrial fibrillation-this was not documented previously and she was in sinus rhythm by electrocardiogram earlier in August. This may be secondary to her large pericardial effusion. CHADSvasc 5. However we will not anticoagulate at this point because of large pericardial effusion and risk of hemorrhagic transformation. Her blood pressure is low and she will not tolerate AV nodal blocking agents at this point. We will add IV amiodarone for rate control. Check TSH.  3 end-stage renal disease-dialysis dependent. Will need nephrology consult.  4 shock-lactate noted to be 8.2. Patient hypotensive. Likely secondary to tamponade. Reassess following pericardiocentesis. Hold all blood pressure medications.  5 question cholecystitis-possible gallbladder wall thickening on CT scan. We will check an ultrasound after pericardiocentesis. Critical care medicine following. She has been given antibiotics.  6 hypertension-follow blood pressure after pericardiocentesis.  7 History of nonobstructive coronary disease by cardiac catheterization August 2017.  Kirk Ruths MD 07/26/2017, 10:34 AM

## 2017-07-26 NOTE — Progress Notes (Signed)
Dr.Alexander CCM in to assess patient, patient had just informed me minutes ago of having Bells Palsy, also made MD aware of right side comes and go with strength and weakness since April.

## 2017-07-26 NOTE — Progress Notes (Signed)
Chaplain responded to STEMI page. Spent time at pt. Bedside. At the time of visit pt.was in and out of consciousness. Will follow up with pt. at the cardiac cath lab. - Chaplain Daleen BoAaron Kayne Yuhas

## 2017-07-26 NOTE — Progress Notes (Signed)
Called code stroke with Dr. Jamison NeighborNestor aware of up and down noted glucoses, beginning and changing assessment of patient.

## 2017-07-26 NOTE — Progress Notes (Signed)
eLink Physician-Brief Progress Note Patient Name: Colleen PlowmanDianne Mullins DOB: 08/20/1946 MRN: [id number]   Date of Service  07/26/2017  HPI/Events of Note  MRI neg CBGs fluctuant 70-370  eICU Interventions  Can eat Resume sqheparin Hold lantus , am team to re-evaluate need for this     Intervention Category Intermediate Interventions: Hyperglycemia - evaluation and treatment  Colleen Mullins V. 07/26/2017, 11:36 PM

## 2017-07-26 NOTE — Significant Event (Signed)
Rapid Response Event Note  Overview: Time Called: 2055 Arrival Time: 2057 Event Type: Neurologic  Initial Focused Assessment: Right sided weakness   Interventions: NIHSS exam  Plan of Care (if not transferred):  Event Summary: Code stroke initiated with patients complaint of right face and arm weakness. Patient is alert and responsive. States that she has had right hand weakness off and on today but was unsure of the time it started. Unit RN states that patient did not look right on her initial assessment with right facial weakness. LSN is unknown at this time. NIHSS is a 4. Patient was taken down to CT for head scan and then to MRI. Results are pending.   Beryl MeagerHarbison, [place] Dawson SpringsLamond

## 2017-07-26 NOTE — Progress Notes (Signed)
eLink Physician-Brief Progress Note Patient Name: Colleen PlowmanDianne Tat DOB: 04/02/1946 MRN: [id number]   Date of Service  07/26/2017  HPI/Events of Note  Notified of unilateral weakness with worsening of hand grip. Intensivist currently addressing a critically ill patient.   eICU Interventions  Requested nursing initiation of code stroke      Intervention Category Major Interventions: Other:  Lawanda CousinsJennings Nary Sneed 07/26/2017, 8:53 PM

## 2017-07-26 NOTE — H&P (Signed)
PULMONARY / CRITICAL CARE MEDICINE   Name: Colleen Mullins MRN: [id number] DOB: [birth year]    ADMISSION DATE:  07/26/2017 CONSULTATION DATE:  07/26/2017  REFERRING MD:  Dr. Hessie Diener  CHIEF COMPLAINT:  Right side, right upper quadrant pain  HISTORY OF PRESENT ILLNESS:   71 year old female with a past medical history significant for end-stage renal disease presented to the [facility] emergency department on 07/26/2017 complaining of right-sided pain. She notes that the pain has been present for several months after a fall back in April. However, it should be noted that getting an accurate history seems difficult as the patient is quite anxious. She was noted in the emergency department to have a large pericardial effusion. She denied fever, chills, recent postprandial pain. She did have some nausea and vomiting the morning of admission.  PAST MEDICAL HISTORY :  She  has a past medical history of Abnormal nuclear stress test (07/10/2016); Anemia; Anemia in chronic renal disease (07/08/2016); Arthritis; CAD in native artery (08/12/2016); Diabetes mellitus; Diabetic retinopathy (HCC); Dialysis patient [facility]); Diastolic dysfunction; Dyspnea on exertion (04/14/2016); Ejection fraction; Elevated troponin (10/16/2015); ESRD on dialysis [facility]) (04/14/2016); ESRD on hemodialysis (HCC); GERD (gastroesophageal reflux disease); Glaucoma; H/O: Bell's palsy (2007); Hyperlipidemia; Hypertension; Macular degeneration; Musculoskeletal chest pain; PAD (peripheral artery disease) (HCC); Pseudoaneurysm of arteriovenous graft (HCC) (03/15/2016); PVD (peripheral vascular disease) (HCC) (03/18/2017); and Type 2 diabetes mellitus with renal complication (HCC) (09/26/2011).  PAST SURGICAL HISTORY: She  has a past surgical history that includes Tonsillectomy; BTL; Arteriovenous graft placement; Arteriovenous graft placement; laparotomy (?2000); Arteriovenous goretex graft removal (10/08/2011); Abdominal hysterectomy; Eye surgery (Left);  Colonoscopy; Revision of arteriovenous goretex graft (Left, 03/27/2016); Exchange of a dialysis catheter (Right, 03/27/2016); and Cardiac catheterization (N/A, 07/10/2016).  Allergies  Allergen Reactions  . Iodinated Diagnostic Agents Hives and Itching  . Lactose Intolerance (Gi) Other (See Comments)    Abdominal pains and bloating    No current facility-administered medications on file prior to encounter.    Current Outpatient Prescriptions on File Prior to Encounter  Medication Sig  . acetaminophen (TYLENOL) 650 MG CR tablet Take 650 mg by mouth every 8 (eight) hours as needed for pain.   Marland Kitchen amLODipine (NORVASC) 10 MG tablet Take 10 mg by mouth at bedtime.   Marland Kitchen aspirin EC 81 MG tablet Take 81 mg by mouth at bedtime.   . B Complex-C-Folic Acid (RENAL-VITE) 0.8 MG TABS Take 1 tablet by mouth every evening. Vitamin B complex-vitamin C folic acid  . calcium acetate (PHOSLO) 667 MG capsule Take 667-1,334 mg by mouth See admin instructions. Take 2 capsules (1334 mg) by mouth 3 times daily with meals and 1 capsule (667 mg) with snacks  . cinacalcet (SENSIPAR) 60 MG tablet Take 60 mg by mouth daily. Monday, Wednesday and Friday  . cyclobenzaprine (FLEXERIL) 5 MG tablet Take 5 mg by mouth 3 (three) times daily as needed for muscle spasms.   . diazepam (VALIUM) 5 MG tablet Take 5 mg by mouth every 6 (six) hours as needed for anxiety.  Marland Kitchen esomeprazole (NEXIUM) 40 MG capsule Take 40 mg by mouth at bedtime.  Marland Kitchen ethyl chloride spray Apply 1 application topically daily as needed (dialysis). For use at dialysis   . gabapentin (NEURONTIN) 100 MG capsule Take 1 capsule (100 mg total) by mouth 3 (three) times daily.  Marland Kitchen HYDROcodone-acetaminophen (NORCO) 5-325 MG tablet Take 1 tablet by mouth every 4 (four) hours as needed for moderate pain.  Marland Kitchen insulin aspart (NOVOLOG) 100 UNIT/ML injection Inject 0-15  Units into the skin 3 (three) times daily with meals. Inject three times a day before meals  70-120 = 0  units 121-150 =2 units 151-200 - 3 units 201-250 = 5 units 251-300 = 8 units 301-350 = 11 units 351-400 = 15 units Greater than 400 call provider and give 15 units  . labetalol (NORMODYNE) 300 MG tablet Take 300 mg by mouth See admin instructions. Take 1 tablet (300 mg) by mouth twice daily - at noon and at bedtime  . losartan (COZAAR) 100 MG tablet Take 100 mg by mouth at bedtime.   . Nutritional Supplements (FEEDING SUPPLEMENT, NEPRO CARB STEADY,) LIQD Take 237 mLs by mouth 2 (two) times daily between meals.  . ondansetron (ZOFRAN) 4 MG tablet Take 1 tablet (4 mg total) by mouth every 8 (eight) hours as needed for nausea or vomiting.  . OXYGEN Inhale into the lungs as needed (shortness of breath).  . ranitidine (ZANTAC) 75 MG tablet Take 75 mg by mouth every evening.   . Travoprost, BAK Free, (TRAVATAN) 0.004 % SOLN ophthalmic solution Place 1 drop into both eyes at bedtime.    FAMILY HISTORY:  Her indicated that her mother is deceased. She indicated that her father is deceased. She indicated that both of her sisters are alive. She indicated that her maternal grandmother is deceased. She indicated that her maternal grandfather is deceased. She indicated that her paternal grandmother is deceased. She indicated that her paternal grandfather is deceased. She indicated that all of her three daughters are alive.    SOCIAL HISTORY: She  reports that she quit smoking about 17 years ago. Her smoking use included Cigarettes. She has never used smokeless tobacco. She reports that she does not drink alcohol or use drugs.  REVIEW OF SYSTEMS:   Cannot obtain due to patient's severe anxiety  SUBJECTIVE:  As above  VITAL SIGNS: BP (!) 70/40 (BP Location: Left Arm)   Pulse (!) 128   Temp 98.5 F (36.9 C) (Rectal)   Resp (!) 22   Ht 5\' 6"  (1.676 m)   Wt 69.9 kg (154 lb 1.6 oz)   SpO2 99%   BMI 24.87 kg/m   HEMODYNAMICS:    VENTILATOR SETTINGS:    INTAKE / OUTPUT: No intake/output  data recorded.  PHYSICAL EXAMINATION:  General:  anxious HENT: NCAT OP clear PULM: rapid respiratory rate but clear lungs, speaking in full sentences, no accessory muscle use CV: Tachycardic, regular rate and rhythm, distant heart sounds GI: BS+, soft, nontender, negative Murphy's sign MSK: normal bulk and tone Neuro: awake, alert, conversant, anxious  LABS:  BMET  Recent Labs Lab 07/26/17 0818  NA 132*  K 4.5  CL 90*  CO2 20*  BUN 35*  CREATININE 5.09*  GLUCOSE 520*    Electrolytes  Recent Labs Lab 07/26/17 0818  CALCIUM 8.6*    CBC  Recent Labs Lab 07/26/17 0818  WBC 11.4*  HGB 9.3*  HCT 30.2*  PLT 533*    Coag's No results for input(s): APTT, INR in the last 168 hours.  Sepsis Markers  Recent Labs Lab 07/26/17 0831  LATICACIDVEN 8.18*    ABG No results for input(s): PHART, PCO2ART, PO2ART in the last 168 hours.  Liver Enzymes  Recent Labs Lab 07/26/17 0818  AST 42*  ALT 25  ALKPHOS 132*  BILITOT 0.6  ALBUMIN 2.9*    Cardiac Enzymes No results for input(s): TROPONINI, PROBNP in the last 168 hours.  Glucose No results for input(s): GLUCAP in  the last 168 hours.  Imaging Ct Abdomen Pelvis Wo Contrast  Result Date: 07/26/2017 CLINICAL DATA:  Mid abdominal pain with nausea and vomiting. End-stage renal disease, dialysis dependent. EXAM: CT ABDOMEN AND PELVIS WITHOUT CONTRAST TECHNIQUE: Multidetector CT imaging of the abdomen and pelvis was performed following the standard protocol without IV contrast. COMPARISON:  07/26/2017 chest x-ray 07/30/2015 CT without contrast FINDINGS: Lower chest: Large pericardial effusion. Small pleural effusions, slightly larger on the left. Aorta is atherosclerotic. Streaky bibasilar atelectasis. Degenerative changes of the lower thoracic spine. Hepatobiliary: Limited assessment without IV contrast. Diffuse periportal edema evident with small amount of perihepatic ascites on the right. Similar pattern of  scattered small indeterminate hepatic hypodensities all measuring 10 mm or less in size, suspect small scattered hepatic cysts. Gallbladder demonstrates wall thickening and pericholecystic fluid. No surrounding inflammation. However, difficult to exclude cholecystitis. Consider correlation with ultrasound. No gross biliary obstruction. Gallbladder findings can also be seen with chronic hepatic disease. Pancreas: Limited assess without contrast. No large focal abnormality or ductal dilatation. No surrounding fluid collection or inflammation. Spleen: Normal in size without focal abnormality. Adrenals/Urinary Tract: Normal adrenal glands for age. Kidneys demonstrate chronic perinephric strandy edema. Nonobstructing intrarenal calculi bilaterally in the lower poles. Renal vascular calcifications also evident. No acute obstructive uropathy or hydronephrosis. Negative for hydroureter or ureteral obstruction. Urinary bladder collapsed. Stomach/Bowel: Negative for bowel obstruction, significant dilatation, ileus, or free air. Scattered colonic diverticulosis. Appendix is unremarkable. No fluid collection or abscess. Moderate stool burden throughout the colon. Vascular/Lymphatic: Extensive calcific atherosclerosis of the aorta and iliac vessels. No retroperitoneal hemorrhage or hematoma. Left femoral access graft noted. On the right, the right internal iliac artery continues as a persistent right sciatic artery, normal arterial variant anatomy. Reproductive: Remote hysterectomy. No adnexal mass or abnormality. No fluid collection or abscess. Other: No inguinal or abdominal wall hernia. Musculoskeletal: Degenerative changes of the spine SI joints. Vacuum disc phenomena at L4-5 and L5-S1. No acute osseous finding. IMPRESSION: Large pericardial effusion. Small pleural effusions with bibasilar atelectasis Periportal hepatic edema and small amount of perihepatic ascites with probable scattered small hepatic cysts. No biliary  obstruction. Gallbladder wall thickening suspected with pericholecystic fluid. Difficult to exclude cholecystitis. Consider ultrasound correlation. Gallbladder findings can also be seen with chronic hepatic disease. Nonobstructing nephrolithiasis Extensive aortoiliac atherosclerosis Persistent right sciatic artery noted, normal vascular variant. Electronically Signed   By: Judie Petit.  Shick M.D.   On: 07/26/2017 09:28   Dg Chest Port 1 View  Result Date: 07/26/2017 CLINICAL DATA:  RIGHT chest pain. Dialysis patient. Pt is a dialysis pt who is supposed to have dialysis tomorrow. Pt reports that her dialysis port is bleeding and it was not bleeding after dialysis Wednesday, states entire right side is hurting, right chest area. Hx of CAD. Diabetes. HTN, PAD, DM2 EXAM: PORTABLE CHEST 1 VIEW COMPARISON:  06/29/2017 FINDINGS: Cardiac silhouette is enlarged. Silhouette is enlarged compared to 06/29/2017. No effusion or infiltrate. No pulmonary edema. No pneumothorax for IMPRESSION: Increase cardiac silhouette compared to 06/29/2017. Differential includes cardiomegaly versus pericardial effusion. Electronically Signed   By: Genevive Bi M.D.   On: 07/26/2017 08:37     STUDIES:  07/26/2017 stat echocardiogram performed in emergency department shows large pericardial effusion with right ventricle collapse consistent with tamponade 07/26/2017 CT abdomen showed a large pericardial effusion, some perihepatic edema, gallbladder wall thickening  CULTURES: 07/26/2017 blood cultures  ANTIBIOTICS: 07/26/2017 vancomycin 1 07/26/2017 Zosyn 1 07/26/2017 Unasyn >  SIGNIFICANT EVENTS:   LINES/TUBES:   DISCUSSION: 71 year old  female with a past medical history significant for end-stage renal disease and diabetes mellitus presented to the [facility] emergency department with progressive right-sided chest and abdominal pain and hypotension with an elevated lactic acid. She has evidence of RV collapse on stat  echocardiogram which was performed in the emergency department so she has tamponade. The differential diagnosis includes an infectious cause like cholecystitis but this is much less likely as she has no fever or signs or symptoms prior to presentation which are consistent with acute cholecystitis. Regardless, we will administer IV antibiotics while ruling out sepsis.  ASSESSMENT / PLAN:  PULMONARY A: Tachypnea P:   Monitor respiratory status closely Admit to the intensive care unit   CARDIOVASCULAR A:  Tachycardia Shock secondary to tamponade P:  Telemetry monitoring To Cath Lab stat for pericardial drain Continue gentle IV fluids for now Repeat lactic acid later today  RENAL A:   End-stage renal disease P:   Consult renal Monitor BMET and UOP Replace electrolytes as needed   GASTROINTESTINAL A:   Right sided pain, right upper quadrant pain: Physical exam does not appear to be consistent with acute cholecystitis P:   Follow-up right upper quadrant ultrasound Nothing by mouth for now  HEMATOLOGIC A:   Anemia without bleeding P:  Monitor for bleeding CBC on an as-needed basis  INFECTIOUS A:   Possible acute cholecystitis, doubt P:   Follow-up blood cultures Change IV antibiotics to Unasyn Likely stop antibiotics on 07/27/2017  ENDOCRINE A:   Hyperglycemia, history of type 2 diabetes mellitus with end-stage renal disease   P:   Sliding scale insulin for now  NEUROLOGIC A:   Anxiety, benzodiazepine dependence P:   Ativan on an as-needed basis   FAMILY  - Updates: None bedside  - Inter-disciplinary family meet or Palliative Care meeting due by:  day 7   My cc time 35 minutes  Discussed with Drs. Emilio Math and Schertz  Heber , MD [place] [facility] Pager: [phone] Cell: [phone] After 3pm or if no response, call [phone]   07/26/2017, 10:23 AM

## 2017-07-26 NOTE — ED Triage Notes (Signed)
Pt reports  She slammed her  Rt shoulder  Shoulder on door of bathroom last night. Pt reports the Rt shoulder was injured  In April of 2018 . Pt reports her Rt shoulder hurts all the time but pain is worse after hitting the shoulder last night. Pt moves all 4 ext.

## 2017-07-26 NOTE — Progress Notes (Signed)
Assessed LA for PIV with US.  Unable to find suitable vein due to small size and depth.  Pt cold and clammy, SBP 70-80's, pending transfer to cathlab.  RN notified MD notified. Not a PICC candidate.

## 2017-07-26 NOTE — ED Triage Notes (Signed)
Call Family:  Margit HanksWhitney  Grandaughter  [phone]  ;  [phone]

## 2017-07-26 NOTE — Consult Note (Signed)
[facility] Renal Consultation Note    Indication for Consultation:  Management of ESRD/hemodialysis, anemia, hypertension/volume, and secondary hyperparathyroidism. PCP:  HPI: Colleen Mullins is a 71 y.o. female with ESRD, Type 2 DM, CAD, GERD, HTN who was admitted with large pericardial effusion.  Presented to the ED with severe R sided CP and SOB x 1 week, found to be very hypotensive and with new onset atrial fibrillation. Labs showed WBC 11.4, Hgb 9.3, glucose 520, K 4.5. Abdominal CT showed large pericardial effusion and concern for cholecystitis as well. Cardiology was consulted, performed echo showing RV collapse and tamponade, and took her to the cath lab urgently for pericardiocentesis where she had fluid drained with tube placed. BP improved s/p procedure. We saw her in her room, feeling much better albeit sleepy. She denies CP or dyspnea at the time. No abdominal pain or fevers. Work-up of the gallbladder is ongoing. BCx drawn and she was started on Unasyn.  From renal standpoint, she dialyzes MWF at [facility]. Last HD was yesterday which she completed in entirety. She has not missed recent HD sessions, occasionally cuts time short. She has been reaching her clearance goals.  Past Medical History:  Diagnosis Date  . Abnormal nuclear stress test 07/10/2016  . Anemia    low iron  . Anemia in chronic renal disease 07/08/2016  . Arthritis   . CAD in native artery 08/12/2016   a. Nonobstructive ASCAD by cath with 60% D1 and 50% PL off RCA and 25% mid LAD.  . Diabetes mellitus    type 2  . Diabetic retinopathy (HCC)   . Dialysis patient [facility])    M-W-F @ [facility]  . Diastolic dysfunction   . Dyspnea on exertion 04/14/2016  . Ejection fraction   . Elevated troponin 10/16/2015  . ESRD on dialysis (HCC) 04/14/2016  . ESRD on hemodialysis [facility])    Dialysis T/Th/Sa  . GERD (gastroesophageal reflux disease)   . Glaucoma   . H/O: Bell's palsy 2007  .  Hyperlipidemia   . Hypertension    a. labile - hx of BP dropping at HD.  . Macular degeneration   . Musculoskeletal chest pain   . PAD (peripheral artery disease) (HCC)    a. ABI 08/2016: mod RLE disease, normal L ABI.  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 PVD (peripheral vascular disease) (HCC) 03/18/2017  . Type 2 diabetes mellitus with renal complication (HCC) 09/26/2011   Past Surgical History:  Procedure Laterality Date  . ABDOMINAL HYSTERECTOMY    . ARTERIOVENOUS GRAFT PLACEMENT     Left forearm x 2, one removed in March  . ARTERIOVENOUS GRAFT PLACEMENT     Left forearm  . AVGG REMOVAL  10/08/2011   Procedure: REMOVAL OF ARTERIOVENOUS GORETEX GRAFT (AVGG);  Surgeon: Sherren Kerns, MD;  Location: [facility] OR;  Service: Vascular;  Laterality: Left;  . BTL    . CARDIAC CATHETERIZATION N/A 07/10/2016   Procedure: Left Heart Cath and Coronary Angiography;  Surgeon: Yvonne Kendall, MD;  Location: [facility] INVASIVE CV LAB;  Service: Cardiovascular;  Laterality: N/A;  . COLONOSCOPY    . EXCHANGE OF A DIALYSIS CATHETER Right 03/27/2016   Procedure: EXCHANGE OF A DIALYSIS CATHETER;  Surgeon: Fransisco Hertz, MD;  Location: [facility] OR;  Service: Vascular;  Laterality: Right;  . EYE SURGERY Left    Lasik surgery on left. Cataract surgery on both eyes  . LAPAROTOMY  ?2000   Pelvic mass (Benign)  .  REVISION OF ARTERIOVENOUS GORETEX GRAFT Left 03/27/2016   Procedure: REVISION OF LEFT ARTERIOVENOUS GORETEX GRAFT;  Surgeon: Fransisco Hertz, MD;  Location: [facility] OR;  Service: Vascular;  Laterality: Left;  . TONSILLECTOMY     Family History  Problem Relation Age of Onset  . Diabetes Mother   . Heart disease Mother   . COPD Father   . Hypertension Father   . Lung disease Father   . Diabetes Father   . Diabetes Sister   . Breast cancer Sister    Social History:  reports that she quit smoking about 17 years ago. Her smoking use included Cigarettes. She has never used smokeless tobacco. She  reports that she does not drink alcohol or use drugs.  ROS: As per HPI otherwise negative.  Physical Exam: Vitals:   07/26/17 1213 07/26/17 1235 07/26/17 1238 07/26/17 1330  BP: 123/70  (!) 122/93 124/83  Pulse: (!) 151  (!) 56 (!) 144  Resp: 20 14 (!) 23 17  Temp:  (!) 97.5 F (36.4 C)    TempSrc:  Oral    SpO2: 92%  90% 96%  Weight:  73 kg (160 lb 15 oz)    Height:  5\' 5"  (1.651 m)       General: Drowsy appearing female. NAD. Head: Normocephalic, atraumatic, sclera non-icteric, mucus membranes are moist. Neck: Supple without lymphadenopathy/masses. JVD not elevated. Lungs: Clear bilaterally to auscultation without wheezes, rales, or rhonchi. Breathing is unlabored. Heart: Irregularly irregular. No murmur or rub. Pericardial tube connected to small vacuum bottle with bloody drainage. Abdomen: Soft, non-tender, non-distended with normoactive bowel sounds.  Musculoskeletal:  Strength and tone appear normal for age. Lower extremities: No edema or ischemic changes, no open wounds. Neuro: Alert and oriented X 3. Moves all extremities spontaneously. Psych:  Responds to questions appropriately with a normal affect, but scattered historian. Dialysis Access: AVG + bruit  Allergies  Allergen Reactions  . Iodinated Diagnostic Agents Hives and Itching  . Lactose Intolerance (Gi) Other (See Comments)    Abdominal pains and bloating   Prior to Admission medications   Medication Sig Start Date End Date Taking? Authorizing Provider  acetaminophen (TYLENOL) 650 MG CR tablet Take 650 mg by mouth every 8 (eight) hours as needed for pain.     [provider]  amLODipine (NORVASC) 10 MG tablet Take 10 mg by mouth at bedtime.     [provider]  aspirin EC 81 MG tablet Take 81 mg by mouth at bedtime.     [provider]  B Complex-C-Folic Acid (RENAL-VITE) 0.8 MG TABS Take 1 tablet by mouth every evening. Vitamin B complex-vitamin C folic acid    [provider]  calcium acetate (PHOSLO) 667 MG capsule Take 667-1,334 mg by mouth See admin instructions. Take 2 capsules (1334 mg) by mouth 3 times daily with meals and 1 capsule (667 mg) with snacks    [provider]  cinacalcet (SENSIPAR) 60 MG tablet Take 60 mg by mouth daily. Monday, Wednesday and Friday    [provider]  cyclobenzaprine (FLEXERIL) 5 MG tablet Take 5 mg by mouth 3 (three) times daily as needed for muscle spasms.     [provider]  diazepam (VALIUM) 5 MG tablet Take 5 mg by mouth every 6 (six) hours as needed for anxiety.    [provider]  esomeprazole (NEXIUM) 40 MG capsule Take 40 mg by mouth at bedtime. 03/11/17   [provider]  ethyl chloride  spray Apply 1 application topically daily as needed (dialysis). For use at dialysis     [provider]  gabapentin (NEURONTIN) 100 MG capsule Take 1 capsule (100 mg total) by mouth 3 (three) times daily. 12/30/16   Joseph ArtVann, Jessica U, DO  HYDROcodone-acetaminophen (NORCO) 5-325 MG tablet Take 1 tablet by mouth every 4 (four) hours as needed for moderate pain. 06/30/17   Tegeler, Canary Brimhristopher J, MD  insulin aspart (NOVOLOG) 100 UNIT/ML injection Inject 0-15 Units into the skin 3 (three) times daily with meals. Inject three times a day before meals  70-120 = 0 units 121-150 =2 units 151-200 - 3 units 201-250 = 5 units 251-300 = 8 units 301-350 = 11 units 351-400 = 15 units Greater than 400 call provider and give 15 units    [provider]  labetalol (NORMODYNE) 300 MG tablet Take 300 mg by mouth See admin instructions. Take 1 tablet (300 mg) by mouth twice daily - at noon and at bedtime 10/01/16   [provider]  losartan (COZAAR) 100 MG tablet Take 100 mg by mouth at bedtime.     [provider]  Nutritional Supplements (FEEDING SUPPLEMENT, NEPRO CARB STEADY,) LIQD Take 237 mLs by mouth 2 (two) times daily between meals.    [provider]  ondansetron  (ZOFRAN) 4 MG tablet Take 1 tablet (4 mg total) by mouth every 8 (eight) hours as needed for nausea or vomiting. 06/30/17   Tegeler, Canary Brimhristopher J, MD  OXYGEN Inhale into the lungs as needed (shortness of breath).    [provider]  ranitidine (ZANTAC) 75 MG tablet Take 75 mg by mouth every evening.     [provider]  Travoprost, BAK Free, (TRAVATAN) 0.004 % SOLN ophthalmic solution Place 1 drop into both eyes at bedtime.    [provider]   Current Facility-Administered Medications  Medication Dose Route Frequency Provider Last Rate Last Dose  . 0.9 %  sodium chloride infusion  250 mL Intravenous PRN Max FickleMcQuaid, Douglas B, MD      . 0.9 %  sodium chloride infusion  250 mL Intravenous PRN Corky CraftsVaranasi, Jayadeep S, MD      . acetaminophen (TYLENOL) tablet 650 mg  650 mg Oral Q4H PRN Max FickleMcQuaid, Douglas B, MD      . Ampicillin-Sulbactam (UNASYN) 3 g in sodium chloride 0.9 % 100 mL IVPB  3 g Intravenous Q24H Earnie LarssonWilson, Frank R, RPH      . famotidine (PEPCID) tablet 20 mg  20 mg Oral QHS Max FickleMcQuaid, Douglas B, MD      . gabapentin (NEURONTIN) capsule 100 mg  100 mg Oral TID Max FickleMcQuaid, Douglas B, MD      . heparin injection 5,000 Units  5,000 Units Subcutaneous Q8H McQuaid, Douglas B, MD      . HYDROcodone-acetaminophen (NORCO/VICODIN) 5-325 MG per tablet 1 tablet  1 tablet Oral Q4H PRN Max FickleMcQuaid, Douglas B, MD      . insulin aspart (novoLOG) injection 0-15 Units  0-15 Units Subcutaneous Q4H Lupita LeashMcQuaid, Douglas B, MD   15 Units at 07/26/17 1326  . LORazepam (ATIVAN) 2 MG/ML injection           . LORazepam (ATIVAN) injection 0.5-1 mg  0.5-1 mg Intravenous Q4H PRN Max FickleMcQuaid, Douglas B, MD   0.5 mg at 07/26/17 1037  . ondansetron (ZOFRAN) injection 4 mg  4 mg Intravenous Q6H PRN Max FickleMcQuaid, Douglas B, MD      . pantoprazole (PROTONIX) EC tablet 40 mg  40 mg Oral  Daily Max Fickle B, MD      . sodium chloride 0.9 % bolus 1,000 mL  1,000 mL Intravenous Once Lorre Nick, MD      . sodium chloride  flush (NS) 0.9 % injection 3 mL  3 mL Intravenous Q12H Lance Muss S, MD      . sodium chloride flush (NS) 0.9 % injection 3 mL  3 mL Intravenous PRN Corky Crafts, MD       Labs: Basic Metabolic Panel:  Recent Labs Lab 07/26/17 0818  NA 132*  K 4.5  CL 90*  CO2 20*  GLUCOSE 520*  BUN 35*  CREATININE 5.09*  CALCIUM 8.6*   Liver Function Tests:  Recent Labs Lab 07/26/17 0818  AST 42*  ALT 25  ALKPHOS 132*  BILITOT 0.6  PROT 6.2*  ALBUMIN 2.9*    Recent Labs Lab 07/26/17 0818  LIPASE 24   CBC:  Recent Labs Lab 07/26/17 0818  WBC 11.4*  NEUTROABS 8.6*  HGB 9.3*  HCT 30.2*  MCV 88.0  PLT 533*   CBG:  Recent Labs Lab 07/26/17 1248  GLUCAP 379*   Studies/Results: Ct Abdomen Pelvis Wo Contrast  Result Date: 07/26/2017 CLINICAL DATA:  Mid abdominal pain with nausea and vomiting. End-stage renal disease, dialysis dependent. EXAM: CT ABDOMEN AND PELVIS WITHOUT CONTRAST TECHNIQUE: Multidetector CT imaging of the abdomen and pelvis was performed following the standard protocol without IV contrast. COMPARISON:  07/26/2017 chest x-ray 07/30/2015 CT without contrast FINDINGS: Lower chest: Large pericardial effusion. Small pleural effusions, slightly larger on the left. Aorta is atherosclerotic. Streaky bibasilar atelectasis. Degenerative changes of the lower thoracic spine. Hepatobiliary: Limited assessment without IV contrast. Diffuse periportal edema evident with small amount of perihepatic ascites on the right. Similar pattern of scattered small indeterminate hepatic hypodensities all measuring 10 mm or less in size, suspect small scattered hepatic cysts. Gallbladder demonstrates wall thickening and pericholecystic fluid. No surrounding inflammation. However, difficult to exclude cholecystitis. Consider correlation with ultrasound. No gross biliary obstruction. Gallbladder findings can also be seen with chronic hepatic disease. Pancreas: Limited assess  without contrast. No large focal abnormality or ductal dilatation. No surrounding fluid collection or inflammation. Spleen: Normal in size without focal abnormality. Adrenals/Urinary Tract: Normal adrenal glands for age. Kidneys demonstrate chronic perinephric strandy edema. Nonobstructing intrarenal calculi bilaterally in the lower poles. Renal vascular calcifications also evident. No acute obstructive uropathy or hydronephrosis. Negative for hydroureter or ureteral obstruction. Urinary bladder collapsed. Stomach/Bowel: Negative for bowel obstruction, significant dilatation, ileus, or free air. Scattered colonic diverticulosis. Appendix is unremarkable. No fluid collection or abscess. Moderate stool burden throughout the colon. Vascular/Lymphatic: Extensive calcific atherosclerosis of the aorta and iliac vessels. No retroperitoneal hemorrhage or hematoma. Left femoral access graft noted. On the right, the right internal iliac artery continues as a persistent right sciatic artery, normal arterial variant anatomy. Reproductive: Remote hysterectomy. No adnexal mass or abnormality. No fluid collection or abscess. Other: No inguinal or abdominal wall hernia. Musculoskeletal: Degenerative changes of the spine SI joints. Vacuum disc phenomena at L4-5 and L5-S1. No acute osseous finding. IMPRESSION: Large pericardial effusion. Small pleural effusions with bibasilar atelectasis Periportal hepatic edema and small amount of perihepatic ascites with probable scattered small hepatic cysts. No biliary obstruction. Gallbladder wall thickening suspected with pericholecystic fluid. Difficult to exclude cholecystitis. Consider ultrasound correlation. Gallbladder findings can also be seen with chronic hepatic disease. Nonobstructing nephrolithiasis Extensive aortoiliac atherosclerosis Persistent right sciatic artery noted, normal vascular variant. Electronically Signed   By: Judie Petit.  Shick M.D.   On: 07/26/2017 09:28   Dg Chest Port 1  View  Result Date: 07/26/2017 CLINICAL DATA:  RIGHT chest pain. Dialysis patient. Pt is a dialysis pt who is supposed to have dialysis tomorrow. Pt reports that her dialysis port is bleeding and it was not bleeding after dialysis Wednesday, states entire right side is hurting, right chest area. Hx of CAD. Diabetes. HTN, PAD, DM2 EXAM: PORTABLE CHEST 1 VIEW COMPARISON:  06/29/2017 FINDINGS: Cardiac silhouette is enlarged. Silhouette is enlarged compared to 06/29/2017. No effusion or infiltrate. No pulmonary edema. No pneumothorax for IMPRESSION: Increase cardiac silhouette compared to 06/29/2017. Differential includes cardiomegaly versus pericardial effusion. Electronically Signed   By: Genevive Bi M.D.   On: 07/26/2017 08:37    Dialysis Orders:  MWF at [facility] 4hr, BFR 400, DFR A1.5, EDW 71kg, 2K/2.25Ca bath, Profile 2, AVG, Heparin 2500 bolus - Mircera IV q 2 weeks (last given on 8/22) - Hectoral IV q HD  Assessment/Plan: 1. Pericardial effusion with tamponade (s/p urgent pericardiocentesis): Cause unclear,  but unlikely uremic pericarditis as she has been compliant with her HD (dialyzed yesterday). Also patient does not have a rub. Per cardiology and primary. 2.  ESRD: Usually MWF schedule. Will plan for next HD to be Monday 9/3. No heparin. 3.  BP/volume: Hypotensive on admit, improved s/p drainage of effusion. 4.  Anemia: Hgb 9.3. Will be due for ESA next week. Follow. 5.  Metabolic bone disease: Ca ok. Monitor. Will resume VDRA with next HD. 6.  ?Incidental cholecystitis: On CT, per primary. getting Unasyn for now. 7.  Type 2 DM: Per primary. 8.  New onset atrial fibrillation: On amiodarone. Per primary and cardiology. 9.  CAD (non-obstructive per 06/2016 [facility])  Ozzie Hoyle, PA-C 07/26/2017, 2:22 PM  [facility] Pager: [phone]  Pt seen, examined, agree w assess/plan as above.  ESRD pt with new hypotension and pericardial tamponade,  unclear etiology of effusion. Has been drained and patient is feeling better.  Will follow for HD, no heparin for now.  Vinson Moselle MD [employer] pager [phone]    cell [phone] 07/26/2017, 3:10 PM

## 2017-07-26 NOTE — Progress Notes (Signed)
Pharmacy Antibiotic Note  Colleen PlowmanDianne Mullins is a 71 y.o. female admitted on 07/26/2017 with sepsis and possible cholecystitis.  Pharmacy has been consulted for Unasyn dosing. Colleen Mullins complained of right sided pain, lactic acid 8.18, hypotensive,tachy, afebrile, and WBC 11.4, so code sepsis was called.  Vancomycin and Zosyn were given in the ED and upon transfer, CCM changed the antibiotics to Unasyn.  Plan: Zosyn x1 Vancomycin x1 Unasyn 3g Q24 hours with first dose at 2100 (12hrs post zosyn) Follow cultures, LOT, clinical status  Height: 5\' 5"  (165.1 cm) Weight: 160 lb 15 oz (73 kg) IBW/kg (Calculated) : 57  Temp (24hrs), Avg:97.8 F (36.6 C), Min:97.4 F (36.3 C), Max:98.5 F (36.9 C)   Recent Labs Lab 07/26/17 0818 07/26/17 0831 07/26/17 0930  WBC 11.4*  --   --   CREATININE 5.09*  --   --   LATICACIDVEN  --  8.18* 8.2*    Estimated Creatinine Clearance: 10 mL/min (A) (by C-G formula based on SCr of 5.09 mg/dL (H)).     Antimicrobials this admission: 9/1 Zosyn x1 9/1 Vanc x1 9/1 Unasyn >>  Microbiology results: 9/1 Blood Cx>> 9/1 pericardia fluid cx>> 9/1 mrsa pcr>>   Colleen Mullins PharmD PGY1 Pharmacy Practice Resident 07/26/2017 12:55 PM Pager: [phone]

## 2017-07-26 NOTE — Progress Notes (Signed)
  Echocardiogram 2D Echocardiogram has been performed.  Colleen Mullins, Colleen Mullins 07/26/2017, 10:53 AM

## 2017-07-26 NOTE — Consult Note (Signed)
Requesting Physician: Dr. Sharon SellerMcclung    Chief Complaint: Abdominal pain  History obtained from:    Patient and Chart    HPI:                                                                                                                                       Colleen PlowmanDianne Mullins is an 71 y.o. female with end stage renal disease, type 2 diabetes mellitus, coronary artery disease, hypertension presented with right-sided chest pain. She was found to have large pericardial effusion as well as new onset atrial fibrillation. Cardiology performed an echo showing RV collapse and tamponade and she underwent a pericardiocentesis which she had 500 ml fluid and tube placed.    Postprocedure, when the nurse was assessing her around 9 PM she noticed the patient had a right facial droop and right hemiparesis. Stroke alert was called.  It came to our attention later the patient has a history of right Bell's palsy  Date last known well: 9.1.18 Time last known well: 10:30 AM tPA Given: No, pleural effusion status post pericardiocentesis NIHSS 4  Modified Rankin: 0   Past Medical History:  Diagnosis Date  . Abnormal nuclear stress test 07/10/2016  . Anemia    low iron  . Anemia in chronic renal disease 07/08/2016  . Arthritis   . CAD in native artery 08/12/2016   a. Nonobstructive ASCAD by cath with 60% D1 and 50% PL off RCA and 25% mid LAD.  . Diabetes mellitus    type 2  . Diabetic retinopathy (HCC)   . Dialysis patient [facility](HCC)    M-W-F @ [facility]  . Diastolic dysfunction   . Dyspnea on exertion 04/14/2016  . Ejection fraction   . Elevated troponin 10/16/2015  . ESRD on dialysis (HCC) 04/14/2016  . ESRD on hemodialysis [facility](HCC)    Dialysis T/Th/Sa  . GERD (gastroesophageal reflux disease)   . Glaucoma   . H/O: Bell's palsy 2007  . Hyperlipidemia   . Hypertension    a. labile - hx of BP dropping at HD.  . Macular degeneration   . Musculoskeletal chest pain   . PAD (peripheral artery disease) (HCC)    a.  ABI 08/2016: mod RLE disease, normal L ABI.  Marland Kitchen. 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 PVD (peripheral vascular disease) (HCC) 03/18/2017  . Type 2 diabetes mellitus with renal complication (HCC) 09/26/2011    Past Surgical History:  Procedure Laterality Date  . ABDOMINAL HYSTERECTOMY    . ARTERIOVENOUS GRAFT PLACEMENT     Left forearm x 2, one removed in March  . ARTERIOVENOUS GRAFT PLACEMENT     Left forearm  . AVGG REMOVAL  10/08/2011   Procedure: REMOVAL OF ARTERIOVENOUS GORETEX GRAFT (AVGG);  Surgeon: Sherren Kernsharles E Fields, MD;  [facility];  Service: Vascular;  Laterality: Left;  . BTL    . CARDIAC CATHETERIZATION N/A 07/10/2016  Procedure: Left Heart Cath and Coronary Angiography;  Surgeon: Yvonne Kendall, MD;  Location: [facility] INVASIVE CV LAB;  Service: Cardiovascular;  Laterality: N/A;  . COLONOSCOPY    . EXCHANGE OF A DIALYSIS CATHETER Right 03/27/2016   Procedure: EXCHANGE OF A DIALYSIS CATHETER;  Surgeon: Fransisco Hertz, MD;  Location: [facility] OR;  Service: Vascular;  Laterality: Right;  . EYE SURGERY Left    Lasik surgery on left. Cataract surgery on both eyes  . LAPAROTOMY  ?2000   Pelvic mass (Benign)  . REVISION OF ARTERIOVENOUS GORETEX GRAFT Left 03/27/2016   Procedure: REVISION OF LEFT ARTERIOVENOUS GORETEX GRAFT;  Surgeon: Fransisco Hertz, MD;  Location: [facility] OR;  Service: Vascular;  Laterality: Left;  . TONSILLECTOMY      Family History  Problem Relation Age of Onset  . Diabetes Mother   . Heart disease Mother   . COPD Father   . Hypertension Father   . Lung disease Father   . Diabetes Father   . Diabetes Sister   . Breast cancer Sister    Social History:  reports that she quit smoking about 17 years ago. Her smoking use included Cigarettes. She has never used smokeless tobacco. She reports that she does not drink alcohol or use drugs.  Allergies:  Allergies  Allergen Reactions  . Iodinated Diagnostic Agents Hives and Itching  . Lactose Intolerance (Gi)  Other (See Comments)    Abdominal pains and bloating    Medications:                                                                                                                           Reviewed  ROS:                                                                                                                                         General ROS: negative for - chills, fatigue, fever, night sweats, weight gain or weight loss Psychological ROS: negative for - behavioral disorder, hallucinations, memory difficulties, mood swings or suicidal ideation Ophthalmic ROS: negative for - blurry vision, double vision, eye pain or loss of vision ENT ROS: negative for - epistaxis, nasal discharge, oral lesions, sore throat, tinnitus or vertigo Allergy and Immunology ROS: negative for - hives or itchy/watery eyes Hematological and Lymphatic ROS: negative for - bleeding problems, bruising or swollen lymph nodes Endocrine  ROS: negative for - galactorrhea, hair pattern changes, polydipsia/polyuria or temperature intolerance Respiratory ROS: negative for - cough, hemoptysis, shortness of breath or wheezing Cardiovascular ROS: negative for - chest pain, dyspnea on exertion, edema or irregular heartbeat Gastrointestinal ROS: negative for - abdominal pain, diarrhea, hematemesis, nausea/vomiting or stool incontinence Genito-Urinary ROS: negative for - dysuria, hematuria, incontinence or urinary frequency/urgency Musculoskeletal ROS: negative for - joint swelling or muscular weakness Neurological ROS: as noted in HPI Dermatological ROS: negative for rash and skin lesion changes   Examination:                                                                                                      General: Appears well-developed and well-nourished.  Psych: Affect appropriate to situation Eyes: No scleral injection HENT: No OP obstrucion Head: Normocephalic.  Cardiovascular: Normal rate and regular rhythm.   Respiratory: Effort normal and breath sounds normal to anterior ascultation GI: Soft.  No distension. There is no tenderness.  Skin: WDI   Neurological Examination Mental Status: Alert, oriented, thought content appropriate.  Speech fluent without evidence of aphasia.  Able to follow 3 step commands without difficulty. Cranial Nerves: II: Discs flat bilaterally; Visual fields grossly normal,  III,IV, VI: ptosis not present, extra-ocular motions intact bilaterally, pupils equal, round, reactive to light and accommodation V,VII: Right facial droop  VIII: hearing normal bilaterally IX,X: uvula rises symmetrically XI: bilateral shoulder shrug XII: midline tongue extension Motor: Right : Upper extremity   4/5    Left:     Upper extremity   2/5  Lower extremity   5/5     Lower extremity   5/5 Tone and bulk:normal tone throughout; no atrophy noted Sensory: Pinprick and light touch intact throughout, bilaterally Deep Tendon Reflexes: 2+ and symmetric throughout Plantars: Right: downgoing   Left: downgoing Cerebellar: normal finger-to-nose, normal rapid alternating movements and normal heel-to-shin test Gait: normal gait and station     Lab Results: Basic Metabolic Panel:  Recent Labs Lab 07/26/17 0818 07/26/17 1411 07/26/17 2112  NA 132*  --  136  K 4.5  --  3.9  CL 90*  --  93*  CO2 20*  --   --   GLUCOSE 520*  --  78  BUN 35*  --  42*  CREATININE 5.09* 5.16* 5.50*  CALCIUM 8.6*  --   --     CBC:  Recent Labs Lab 07/26/17 0818 07/26/17 1411 07/26/17 2112  WBC 11.4* 12.4*  --   NEUTROABS 8.6*  --   --   HGB 9.3* 9.7* 10.2*  HCT 30.2* 30.5* 30.0*  MCV 88.0 86.4  --   PLT 533* 497*  --     Coagulation Studies: No results for input(s): LABPROT, INR in the last 72 hours.  Imaging: Ct Abdomen Pelvis Wo Contrast  Result Date: 07/26/2017 CLINICAL DATA:  Mid abdominal pain with nausea and vomiting. End-stage renal disease, dialysis dependent. EXAM: CT ABDOMEN  AND PELVIS WITHOUT CONTRAST TECHNIQUE: Multidetector CT imaging of the abdomen and pelvis was performed following the standard protocol without IV contrast.  COMPARISON:  07/26/2017 chest x-ray 07/30/2015 CT without contrast FINDINGS: Lower chest: Large pericardial effusion. Small pleural effusions, slightly larger on the left. Aorta is atherosclerotic. Streaky bibasilar atelectasis. Degenerative changes of the lower thoracic spine. Hepatobiliary: Limited assessment without IV contrast. Diffuse periportal edema evident with small amount of perihepatic ascites on the right. Similar pattern of scattered small indeterminate hepatic hypodensities all measuring 10 mm or less in size, suspect small scattered hepatic cysts. Gallbladder demonstrates wall thickening and pericholecystic fluid. No surrounding inflammation. However, difficult to exclude cholecystitis. Consider correlation with ultrasound. No gross biliary obstruction. Gallbladder findings can also be seen with chronic hepatic disease. Pancreas: Limited assess without contrast. No large focal abnormality or ductal dilatation. No surrounding fluid collection or inflammation. Spleen: Normal in size without focal abnormality. Adrenals/Urinary Tract: Normal adrenal glands for age. Kidneys demonstrate chronic perinephric strandy edema. Nonobstructing intrarenal calculi bilaterally in the lower poles. Renal vascular calcifications also evident. No acute obstructive uropathy or hydronephrosis. Negative for hydroureter or ureteral obstruction. Urinary bladder collapsed. Stomach/Bowel: Negative for bowel obstruction, significant dilatation, ileus, or free air. Scattered colonic diverticulosis. Appendix is unremarkable. No fluid collection or abscess. Moderate stool burden throughout the colon. Vascular/Lymphatic: Extensive calcific atherosclerosis of the aorta and iliac vessels. No retroperitoneal hemorrhage or hematoma. Left femoral access graft noted. On the right, the  right internal iliac artery continues as a persistent right sciatic artery, normal arterial variant anatomy. Reproductive: Remote hysterectomy. No adnexal mass or abnormality. No fluid collection or abscess. Other: No inguinal or abdominal wall hernia. Musculoskeletal: Degenerative changes of the spine SI joints. Vacuum disc phenomena at L4-5 and L5-S1. No acute osseous finding. IMPRESSION: Large pericardial effusion. Small pleural effusions with bibasilar atelectasis Periportal hepatic edema and small amount of perihepatic ascites with probable scattered small hepatic cysts. No biliary obstruction. Gallbladder wall thickening suspected with pericholecystic fluid. Difficult to exclude cholecystitis. Consider ultrasound correlation. Gallbladder findings can also be seen with chronic hepatic disease. Nonobstructing nephrolithiasis Extensive aortoiliac atherosclerosis Persistent right sciatic artery noted, normal vascular variant. Electronically Signed   By: Judie Petit.  Shick M.D.   On: 07/26/2017 09:28   Ct Head Wo Contrast  Result Date: 07/26/2017 CLINICAL DATA:  71 year old female with right side deficit. EXAM: CT HEAD WITHOUT CONTRAST TECHNIQUE: Contiguous axial images were obtained from the base of the skull through the vertex without intravenous contrast. COMPARISON:  CT head 03/25/2017 and earlier. FINDINGS: Brain: No acute intracranial hemorrhage identified. No midline shift, mass effect, or evidence of intracranial mass lesion. No ventriculomegaly. Patchy bilateral white matter hypodensity, most pronounced about the left frontal horn, appears stable. No cortically based acute infarct identified. Vascular: Calcified atherosclerosis at the skull base. No suspicious intracranial vascular hyperdensity. Skull: Chronic hyperostosis of the skull. No acute osseous abnormality identified. Sinuses/Orbits: Visualized paranasal sinuses and mastoids are stable and well pneumatized. Other: No acute orbit, scalp, or visible face  soft tissue abnormality. ASPECTS [facility] Stroke Program Early CT Score) - Ganglionic level infarction (caudate, lentiform nuclei, internal capsule, insula, M1-M3 cortex): 7 - Supraganglionic infarction (M4-M6 cortex): 3 Total score (0-10 with 10 being normal): 10 IMPRESSION: 1. Chronic white matter disease. No acute cortically based infarct or intracranial hemorrhage identified. 2. ASPECTS is 10. 3. The above was relayed via text pager to Dr. Arther Dames on 07/26/2017 at 21:51 . Electronically Signed   By: Odessa Fleming M.D.   On: 07/26/2017 21:51   Dg Chest Port 1 View  Result Date: 07/26/2017 CLINICAL DATA:  RIGHT chest pain. Dialysis patient. Pt  is a dialysis pt who is supposed to have dialysis tomorrow. Pt reports that her dialysis port is bleeding and it was not bleeding after dialysis Wednesday, states entire right side is hurting, right chest area. Hx of CAD. Diabetes. HTN, PAD, DM2 EXAM: PORTABLE CHEST 1 VIEW COMPARISON:  06/29/2017 FINDINGS: Cardiac silhouette is enlarged. Silhouette is enlarged compared to 06/29/2017. No effusion or infiltrate. No pulmonary edema. No pneumothorax for IMPRESSION: Increase cardiac silhouette compared to 06/29/2017. Differential includes cardiomegaly versus pericardial effusion. Electronically Signed   By: Genevive Bi M.D.   On: 07/26/2017 08:37     ASSESSMENT AND PLAN   Right-sided weakness right facial droop  MRI negative for an acute stroke MRA head negative for any large vessel occlusion Patient apparently has chronic right-sided weakness, old right Bell's palsy No further neurological recommendations at this point   Deaken Jurgens MD [facility] [phone]  If 7pm to 7am, please call on call as listed on AMION.

## 2017-07-27 ENCOUNTER — Inpatient Hospital Stay (HOSPITAL_COMMUNITY): Payer: Medicare HMO

## 2017-07-27 DIAGNOSIS — E1122 Type 2 diabetes mellitus with diabetic chronic kidney disease: Secondary | ICD-10-CM

## 2017-07-27 DIAGNOSIS — I313 Pericardial effusion (noninflammatory): Secondary | ICD-10-CM

## 2017-07-27 LAB — GLUCOSE, CAPILLARY
GLUCOSE-CAPILLARY: 239 mg/dL — AB (ref 65–99)
GLUCOSE-CAPILLARY: 178 mg/dL — AB (ref 65–99)
GLUCOSE-CAPILLARY: 242 mg/dL — AB (ref 65–99)
GLUCOSE-CAPILLARY: 264 mg/dL — AB (ref 65–99)
Glucose-Capillary: 312 mg/dL — ABNORMAL HIGH (ref 65–99)
Glucose-Capillary: 328 mg/dL — ABNORMAL HIGH (ref 65–99)

## 2017-07-27 LAB — ECHOCARDIOGRAM LIMITED
Height: 65 in
WEIGHTICAEL: 2574.97 [oz_av]

## 2017-07-27 LAB — GLUCOSE, BODY FLUID OTHER: GLUCOSE, BODY FLUID OTHER: 328 mg/dL

## 2017-07-27 LAB — CBC
HEMATOCRIT: 26.1 % — AB (ref 36.0–46.0)
HEMOGLOBIN: 8.2 g/dL — AB (ref 12.0–15.0)
MCH: 26.7 pg (ref 26.0–34.0)
MCHC: 31.4 g/dL (ref 30.0–36.0)
MCV: 85 fL (ref 78.0–100.0)
Platelets: 499 10*3/uL — ABNORMAL HIGH (ref 150–400)
RBC: 3.07 MIL/uL — ABNORMAL LOW (ref 3.87–5.11)
RDW: 16.9 % — AB (ref 11.5–15.5)
WBC: 10.6 10*3/uL — ABNORMAL HIGH (ref 4.0–10.5)

## 2017-07-27 LAB — PROTEIN, BODY FLUID (OTHER): TOTAL PROTEIN, BODY FLUID OTHER: 5.3 g/dL

## 2017-07-27 LAB — BASIC METABOLIC PANEL
Anion gap: 17 — ABNORMAL HIGH (ref 5–15)
BUN: 50 mg/dL — AB (ref 6–20)
CALCIUM: 7.8 mg/dL — AB (ref 8.9–10.3)
CHLORIDE: 93 mmol/L — AB (ref 101–111)
CO2: 25 mmol/L (ref 22–32)
CREATININE: 5.93 mg/dL — AB (ref 0.44–1.00)
GFR calc non Af Amer: 6 mL/min — ABNORMAL LOW (ref >60)
GFR, EST AFRICAN AMERICAN: 7 mL/min — AB (ref >60)
GLUCOSE: 228 mg/dL — AB (ref 65–99)
Potassium: 3.5 mmol/L (ref 3.5–5.1)
Sodium: 135 mmol/L (ref 135–145)

## 2017-07-27 LAB — PH, BODY FLUID: pH, Body Fluid: 7.5

## 2017-07-27 LAB — LD, BODY FLUID (OTHER): LD, Body Fluid: 755 IU/L

## 2017-07-27 MED ORDER — HYDROCODONE-ACETAMINOPHEN 5-325 MG PO TABS
1.0000 | ORAL_TABLET | ORAL | Status: DC | PRN
  Administered 2017-07-27 – 2017-08-01 (×15): 1 via ORAL
  Filled 2017-07-27 (×13): qty 1

## 2017-07-27 MED ORDER — INSULIN ASPART 100 UNIT/ML ~~LOC~~ SOLN
0.0000 [IU] | Freq: Every day | SUBCUTANEOUS | Status: DC
  Administered 2017-07-27: 3 [IU] via SUBCUTANEOUS

## 2017-07-27 MED ORDER — LATANOPROST 0.005 % OP SOLN
1.0000 [drp] | Freq: Every day | OPHTHALMIC | Status: DC
  Administered 2017-07-27 – 2017-07-31 (×5): 1 [drp] via OPHTHALMIC
  Filled 2017-07-27 (×2): qty 2.5

## 2017-07-27 MED ORDER — INSULIN ASPART 100 UNIT/ML ~~LOC~~ SOLN
0.0000 [IU] | Freq: Three times a day (TID) | SUBCUTANEOUS | Status: DC
  Administered 2017-07-27: 5 [IU] via SUBCUTANEOUS
  Administered 2017-07-27: 11 [IU] via SUBCUTANEOUS
  Administered 2017-07-28: 3 [IU] via SUBCUTANEOUS
  Administered 2017-07-28: 5 [IU] via SUBCUTANEOUS
  Administered 2017-07-29 – 2017-07-30 (×2): 8 [IU] via SUBCUTANEOUS
  Administered 2017-07-30: 2 [IU] via SUBCUTANEOUS
  Administered 2017-07-31 – 2017-08-01 (×5): 3 [IU] via SUBCUTANEOUS

## 2017-07-27 MED ORDER — DIAZEPAM 5 MG PO TABS: 5.0000 mg | ORAL_TABLET | Freq: Four times a day (QID) | ORAL | Status: DC | PRN

## 2017-07-27 MED ORDER — GABAPENTIN 100 MG PO CAPS
100.0000 mg | ORAL_CAPSULE | Freq: Three times a day (TID) | ORAL | Status: DC
  Administered 2017-07-27 – 2017-08-01 (×17): 100 mg via ORAL
  Filled 2017-07-27 (×17): qty 1

## 2017-07-27 MED ORDER — CINACALCET HCL 30 MG PO TABS
60.0000 mg | ORAL_TABLET | ORAL | Status: DC
  Administered 2017-07-28 – 2017-08-01 (×2): 60 mg via ORAL
  Filled 2017-07-27 (×2): qty 2

## 2017-07-27 MED ORDER — CYCLOBENZAPRINE HCL 10 MG PO TABS: 5.0000 mg | ORAL_TABLET | Freq: Three times a day (TID) | ORAL | Status: DC | PRN

## 2017-07-27 MED ORDER — FAMOTIDINE 20 MG PO TABS
20.0000 mg | ORAL_TABLET | Freq: Every day | ORAL | Status: DC
  Administered 2017-07-27 – 2017-07-31 (×5): 20 mg via ORAL
  Filled 2017-07-27 (×5): qty 1

## 2017-07-27 MED ORDER — CALCIUM ACETATE (PHOS BINDER) 667 MG PO CAPS
667.0000 mg | ORAL_CAPSULE | ORAL | Status: DC | PRN
  Administered 2017-07-30 – 2017-07-31 (×2): 667 mg via ORAL
  Filled 2017-07-27 (×2): qty 1

## 2017-07-27 MED ORDER — CALCIUM ACETATE (PHOS BINDER) 667 MG PO CAPS
1334.0000 mg | ORAL_CAPSULE | Freq: Three times a day (TID) | ORAL | Status: DC
  Administered 2017-07-27 – 2017-08-01 (×15): 1334 mg via ORAL
  Filled 2017-07-27 (×15): qty 2

## 2017-07-27 MED ORDER — INSULIN GLARGINE 100 UNIT/ML ~~LOC~~ SOLN
10.0000 [IU] | Freq: Every day | SUBCUTANEOUS | Status: DC
  Administered 2017-07-27 – 2017-07-28 (×2): 10 [IU] via SUBCUTANEOUS
  Filled 2017-07-27 (×2): qty 0.1

## 2017-07-27 MED ORDER — AMIODARONE HCL 200 MG PO TABS
400.0000 mg | ORAL_TABLET | Freq: Every day | ORAL | Status: DC
  Administered 2017-07-27 – 2017-07-28 (×2): 400 mg via ORAL
  Filled 2017-07-27 (×2): qty 2

## 2017-07-27 NOTE — Progress Notes (Signed)
[facility] Progress Note  Subjective: no new c/o  Vitals:   07/27/17 1000 07/27/17 1100 07/27/17 1159 07/27/17 1200  BP: 136/67 130/61  (!) 125/58  Pulse:  69  70  Resp: 15 14  (!) 21  Temp:   97.8 F (36.6 C)   TempSrc:   Oral   SpO2:  95%  97%  Weight:      Height:        Inpatient medications: . amiodarone  400 mg Oral Daily  . calcium acetate  1,334 mg Oral TID WC  . [START ON 07/28/2017] cinacalcet  60 mg Oral Once per day on Mon Wed Fri  . famotidine  20 mg Oral QHS  . gabapentin  100 mg Oral TID  . heparin  5,000 Units Subcutaneous Q8H  . insulin aspart  0-15 Units Subcutaneous TID WC  . insulin aspart  0-5 Units Subcutaneous QHS  . insulin glargine  10 Units Subcutaneous Daily  . latanoprost  1 drop Both Eyes QHS  . mouth rinse  15 mL Mouth Rinse BID    acetaminophen, calcium acetate, cyclobenzaprine, diazepam, HYDROcodone-acetaminophen, ondansetron (ZOFRAN) IV  Exam: Alert, no distress No jvd Chest clear on L, dec'd R base RRR no mrg ABd soft ntnd no ascites, pericardial drain in subxiphoid space Ext no LE edema or wounds Ox 3, alert Thigh aVG +bruit  Dialysis:  MWF SW   4h  71kg  L thigh AVG  Hep 2500 (No heparin here d/t bloody pericard effusion) - Mircera IV q 2 weeks (last given on 8/22) - Hectoral IV q HD   Assessment:   1. Pericardial effusion w/ tamponade - s/p urgent pericardiocentesis. Per cards 2. ESRD: Usually MWF schedule. Will plan for next HD to be Monday 9/3. No heparin. 3. Hypotensive on admit, improved s/p drainage of effusion. 4. Anemia: Hgb 9.3. Will be due for ESA next week. Follow. 5. Metabolic bone disease: Ca ok. Monitor. Will resume VDRA with next HD. 6. ?Incidental cholecystitis: On CT, per primary. getting Unasyn for now. 7. Type 2 DM: Per primary. 8. New onset atrial fibrillation: On amiodarone. Per primary and cardiology. 9. CAD (non-obstructive per 06/2016 [facility])  Plan - HD Monday, NO  heparin, UF 2 L to dry wt   Vinson Moselle MD [facility] pager [phone]   07/27/2017, 12:20 PM    Recent Labs Lab 07/26/17 0818 07/26/17 1411 07/26/17 2112 07/27/17 0339  NA 132*  --  136 135  K 4.5  --  3.9 3.5  CL 90*  --  93* 93*  CO2 20*  --   --  25  GLUCOSE 520*  --  78 228*  BUN 35*  --  42* 50*  CREATININE 5.09* 5.16* 5.50* 5.93*  CALCIUM 8.6*  --   --  7.8*    Recent Labs Lab 07/26/17 0818  AST 42*  ALT 25  ALKPHOS 132*  BILITOT 0.6  PROT 6.2*  ALBUMIN 2.9*    Recent Labs Lab 07/26/17 0818 07/26/17 1411 07/26/17 2112 07/27/17 0339  WBC 11.4* 12.4*  --  10.6*  NEUTROABS 8.6*  --   --   --   HGB 9.3* 9.7* 10.2* 8.2*  HCT 30.2* 30.5* 30.0* 26.1*  MCV 88.0 86.4  --  85.0  PLT 533* 497*  --  499*   Iron/TIBC/Ferritin/ %Sat    Component Value Date/Time   IRON 53 08/17/2010 0330   TIBC 359 08/17/2010 0330  IRONPCTSAT 15 (L) 08/17/2010 0330

## 2017-07-27 NOTE — Progress Notes (Signed)
[facility] TEAM 1 - Stepdown/ICU TEAM  Rennie PlowmanDianne Dahmen  WUJ:[id number] DOB: 03/27/1946 DOA: 07/26/2017 PCP: Zoila ShutterWoodyear, Wynne E, MD    Brief Narrative:  71 y.o. female with a history of DM2, vascular disease, HTN, HLD, ESRD on HD, and CAD (last cath August 2017 showed a 60% first diagonal and a 50% posterior lateral) who was admitted with a pericardial effusion and tamponade. Patient went emergently to the cardiac catheterization lab for pericardiocentesis. Patient also noted to be in new onset atrial fibrillation. Echocardiogram prior to pericardiocentesis showed normal LV systolic function, severe left ventricular hypertrophy, and the large pericardial effusion with tamponade physiology.  Significant Events: 9/1 admit - TTE - emergent pericardiocentesis  9/2 [facility] assumed care   Subjective: The patient is resting comfortably in bed at the present time.  She complains of diffuse pain and virtually any area that I ask her and therefore detailed history regarding her pain is difficult to obtain to a reliable degree.  She does however deny nausea vomiting or bowel irregularity.  She tells me she's not been eating much at home lately but simply because she didn't have much of an appetite not because of unrelenting nausea or vomiting.  She tells me she does have a chronic Bell's palsy which intermittently recurs but that it is always on her left.  She tells me she has never had it affect her on the right.  She has never had drooping of the face on the right previously.  She does complain of significant weakness of the right arm and leg but states this dates back to an accidental fall she suffered when being transported in a van to dialysis in April of this year.   Assessment & Plan:  Pericardial effusion/cardiac tamponade Status post emergent pericardiocentesis per Cardiology 9/1 - pericardial drain remains in place - care per Cardiology - clinically stable   Cardiogenic shock Due to tamponade - resolved  post pericardiocentesis  New onset atrial fibrillation Spontaneously converted to sinus rhythm - amiodarone per Cardiology - Chadsvasc is 5 - no anticoagulation presently due to risk for hemorrhagic pericardial effusion  Acute R facial droop / R hemiparesis  Occurred 9 PM 9/1 post pericardiocentesis - MRI negative for acute CVA - MRA head w/o large vessel occlusion - Neurology has evaluated   ESRD Nephrology following - on a M/W/F schedule   DM2 CBG presently variable - follow without change his diet is advanced  Possible cholecystitis Abdominal ultrasound pending - no classic symptoms to suggest an acute cholecystitis  HTN Blood pressure currently reasonably controlled  Nonobstructive CAD Noted on cardiac cath August 2017 - Cardiology following  Anemia of chronic kidney disease No evidence of acute blood loss - follow hemoglobin  DVT prophylaxis: SQ heparin  Code Status: FULL CODE Family Communication: no family present at time of exam  Disposition Plan: stable for transfer once pericardial drain removed   Consultants:  Cardiology Neurology PCCM  Antimicrobials:  Zosyn 9/1 Vancomycin 9/1  Unasyn 9/1  Objective: Blood pressure (!) 141/71, pulse 69, temperature 98.2 F (36.8 C), temperature source Oral, resp. rate 17, height 5\' 5"  (1.651 m), weight 73 kg (160 lb 15 oz), SpO2 97 %.  Intake/Output Summary (Last 24 hours) at 07/27/17 0913 Last data filed at 07/27/17 0800  Gross per 24 hour  Intake          1301.88 ml  Output              125 ml  Net  1176.88 ml   Filed Weights   07/26/17 0742 07/26/17 1235 07/27/17 0341  Weight: 69.9 kg (154 lb 1.6 oz) 73 kg (160 lb 15 oz) 73 kg (160 lb 15 oz)    Examination: General: No acute respiratory distress Lungs: Clear to auscultation bilaterally without wheezes or crackles Cardiovascular: Regular rate and rhythm without murmur gallop or rub normal S1 and S2 Abdomen: Nontender, nondistended, soft, bowel sounds  positive, no rebound, no ascites, no appreciable mass Extremities: No significant cyanosis, clubbing, or edema bilateral lower extremities Neuro:  Alert and oriented x4 - CN2-12 intact B - 5/5 strength L U&L extrem - 4/5 strength R U&L extrem - no babinski   CBC:  Recent Labs Lab 07/26/17 0818 07/26/17 1411 07/26/17 2112 07/27/17 0339  WBC 11.4* 12.4*  --  10.6*  NEUTROABS 8.6*  --   --   --   HGB 9.3* 9.7* 10.2* 8.2*  HCT 30.2* 30.5* 30.0* 26.1*  MCV 88.0 86.4  --  85.0  PLT 533* 497*  --  499*   Basic Metabolic Panel:  Recent Labs Lab 07/26/17 0818 07/26/17 1411 07/26/17 2112 07/27/17 0339  NA 132*  --  136 135  K 4.5  --  3.9 3.5  CL 90*  --  93* 93*  CO2 20*  --   --  25  GLUCOSE 520*  --  78 228*  BUN 35*  --  42* 50*  CREATININE 5.09* 5.16* 5.50* 5.93*  CALCIUM 8.6*  --   --  7.8*   GFR: Estimated Creatinine Clearance: 8.6 mL/min (A) (by C-G formula based on SCr of 5.93 mg/dL (H)).  Liver Function Tests:  Recent Labs Lab 07/26/17 0818  AST 42*  ALT 25  ALKPHOS 132*  BILITOT 0.6  PROT 6.2*  ALBUMIN 2.9*    Recent Labs Lab 07/26/17 0818  LIPASE 24    HbA1C: Hgb A1c MFr Bld  Date/Time Value Ref Range Status  12/29/2016 09:32 PM 5.6 4.8 - 5.6 % Final    Comment:    (NOTE)         Pre-diabetes: 5.7 - 6.4         Diabetes: >6.4         Glycemic control for adults with diabetes: <7.0   04/15/2016 03:53 AM 6.4 (H) 4.8 - 5.6 % Final    Comment:    (NOTE)         Pre-diabetes: 5.7 - 6.4         Diabetes: >6.4         Glycemic control for adults with diabetes: <7.0     CBG:  Recent Labs Lab 07/26/17 1603 07/26/17 2058 07/26/17 2319 07/27/17 0338 07/27/17 0754  GLUCAP 312* 79 90 239* 178*    Recent Results (from the past 240 hour(s))  Gram stain     Status: None   Collection Time: 07/26/17 11:13 AM  Result Value Ref Range Status   Specimen Description FLUID PERICARDIAL  Final   Special Requests NONE  Final   Gram Stain   Final      FEW WBC PRESENT, PREDOMINANTLY PMN NO ORGANISMS SEEN    Report Status 07/26/2017 FINAL  Final  MRSA PCR Screening     Status: Abnormal   Collection Time: 07/26/17 12:35 PM  Result Value Ref Range Status   MRSA by PCR INVALID RESULTS, SPECIMEN SENT FOR CULTURE (A) NEGATIVE Final    Comment: RESULT CALLED TO, READ BACK BY AND VERIFIED WITH: S HINTZ,RN  AT 1736 07/26/17 BY L BENFIELD        The GeneXpert MRSA Assay (FDA approved for NASAL specimens only), is one component of a comprehensive MRSA colonization surveillance program. It is not intended to diagnose MRSA infection nor to guide or monitor treatment for MRSA infections.      Scheduled Meds: . amiodarone  400 mg Oral Daily  . heparin  5,000 Units Subcutaneous Q8H  . insulin aspart  0-15 Units Subcutaneous Q4H  . insulin glargine  10 Units Subcutaneous q1800  . mouth rinse  15 mL Mouth Rinse BID  . sodium chloride flush  3 mL Intravenous Q12H   Continuous Infusions: . sodium chloride    . sodium chloride    . ampicillin-sulbactam (UNASYN) IV Stopped (07/26/17 2339)  . sodium chloride       LOS: 1 day   Lonia Blood, MD [facility] Office  [phone] Pager - Text Page per Amion as per below:  On-Call/Text Page:      [email or web address]      password [email or web address]  If 7PM-7AM, please contact night-coverage [email or web address] Password [facility] 07/27/2017, 9:13 AM

## 2017-07-27 NOTE — Progress Notes (Signed)
  Echocardiogram 2D Echocardiogram limited has been performed.  Nolon RodBrown, Tony 07/27/2017, 2:06 PM

## 2017-07-27 NOTE — Progress Notes (Signed)
Progress Note  Patient Name: Colleen Mullins Date of Encounter: 07/27/2017  Primary Cardiologist: Dr Mayford Knifeurner  Subjective   Chest and abdominal pain improved; denies dyspnea  Inpatient Medications    Scheduled Meds: . heparin  5,000 Units Subcutaneous Q8H  . insulin aspart  0-15 Units Subcutaneous Q4H  . insulin glargine  10 Units Subcutaneous q1800  . mouth rinse  15 mL Mouth Rinse BID  . sodium chloride flush  3 mL Intravenous Q12H   Continuous Infusions: . sodium chloride    . sodium chloride    . amiodarone 30 mg/hr (07/27/17 0200)  . ampicillin-sulbactam (UNASYN) IV Stopped (07/26/17 2339)  . sodium chloride     PRN Meds: sodium chloride, sodium chloride, acetaminophen, LORazepam, ondansetron (ZOFRAN) IV, sodium chloride flush   Vital Signs    Vitals:   07/27/17 0400 07/27/17 0500 07/27/17 0600 07/27/17 0700  BP: 118/61 118/75 120/61 (!) 141/55  Pulse: 67 66 66 66  Resp: 15 15 15  (!) 24  Temp:      TempSrc:      SpO2: 91% 96% 90% 98%  Weight:      Height:        Intake/Output Summary (Last 24 hours) at 07/27/17 0740 Last data filed at 07/27/17 0700  Gross per 24 hour  Intake          1285.18 ml  Output              125 ml  Net          1160.18 ml   Filed Weights   07/26/17 0742 07/26/17 1235 07/27/17 0341  Weight: 69.9 kg (154 lb 1.6 oz) 73 kg (160 lb 15 oz) 73 kg (160 lb 15 oz)    Telemetry    Atrial fibrillation converting to sinus- Personally Reviewed   Physical Exam   GEN: No acute distress.   Neck: No JVD Cardiac: RRR, no murmurs, rubs, or gallops. Pericardial drain in place Respiratory: Clear to auscultation bilaterally. GI: Soft, nontender, non-distended  MS: No edema; No deformity. Neuro:  Nonfocal  Psych: Normal affect   Labs    Chemistry Recent Labs Lab 07/26/17 0818 07/26/17 1411 07/26/17 2112 07/27/17 0339  NA 132*  --  136 135  K 4.5  --  3.9 3.5  CL 90*  --  93* 93*  CO2 20*  --   --  25  GLUCOSE 520*  --  78 228*    BUN 35*  --  42* 50*  CREATININE 5.09* 5.16* 5.50* 5.93*  CALCIUM 8.6*  --   --  7.8*  PROT 6.2*  --   --   --   ALBUMIN 2.9*  --   --   --   AST 42*  --   --   --   ALT 25  --   --   --   ALKPHOS 132*  --   --   --   BILITOT 0.6  --   --   --   GFRNONAA 8* 8*  --  6*  GFRAA 9* 9*  --  7*  ANIONGAP 22*  --   --  17*     Hematology Recent Labs Lab 07/26/17 0818 07/26/17 1411 07/26/17 2112 07/27/17 0339  WBC 11.4* 12.4*  --  10.6*  RBC 3.43* 3.53*  --  3.07*  HGB 9.3* 9.7* 10.2* 8.2*  HCT 30.2* 30.5* 30.0* 26.1*  MCV 88.0 86.4  --  85.0  MCH 27.1 27.5  --  26.7  MCHC 30.8 31.8  --  31.4  RDW 17.4* 17.4*  --  16.9*  PLT 533* 497*  --  499*     Radiology    Ct Abdomen Pelvis Wo Contrast  Result Date: 07/26/2017 CLINICAL DATA:  Mid abdominal pain with nausea and vomiting. End-stage renal disease, dialysis dependent. EXAM: CT ABDOMEN AND PELVIS WITHOUT CONTRAST TECHNIQUE: Multidetector CT imaging of the abdomen and pelvis was performed following the standard protocol without IV contrast. COMPARISON:  07/26/2017 chest x-ray 07/30/2015 CT without contrast FINDINGS: Lower chest: Large pericardial effusion. Small pleural effusions, slightly larger on the left. Aorta is atherosclerotic. Streaky bibasilar atelectasis. Degenerative changes of the lower thoracic spine. Hepatobiliary: Limited assessment without IV contrast. Diffuse periportal edema evident with small amount of perihepatic ascites on the right. Similar pattern of scattered small indeterminate hepatic hypodensities all measuring 10 mm or less in size, suspect small scattered hepatic cysts. Gallbladder demonstrates wall thickening and pericholecystic fluid. No surrounding inflammation. However, difficult to exclude cholecystitis. Consider correlation with ultrasound. No gross biliary obstruction. Gallbladder findings can also be seen with chronic hepatic disease. Pancreas: Limited assess without contrast. No large focal  abnormality or ductal dilatation. No surrounding fluid collection or inflammation. Spleen: Normal in size without focal abnormality. Adrenals/Urinary Tract: Normal adrenal glands for age. Kidneys demonstrate chronic perinephric strandy edema. Nonobstructing intrarenal calculi bilaterally in the lower poles. Renal vascular calcifications also evident. No acute obstructive uropathy or hydronephrosis. Negative for hydroureter or ureteral obstruction. Urinary bladder collapsed. Stomach/Bowel: Negative for bowel obstruction, significant dilatation, ileus, or free air. Scattered colonic diverticulosis. Appendix is unremarkable. No fluid collection or abscess. Moderate stool burden throughout the colon. Vascular/Lymphatic: Extensive calcific atherosclerosis of the aorta and iliac vessels. No retroperitoneal hemorrhage or hematoma. Left femoral access graft noted. On the right, the right internal iliac artery continues as a persistent right sciatic artery, normal arterial variant anatomy. Reproductive: Remote hysterectomy. No adnexal mass or abnormality. No fluid collection or abscess. Other: No inguinal or abdominal wall hernia. Musculoskeletal: Degenerative changes of the spine SI joints. Vacuum disc phenomena at L4-5 and L5-S1. No acute osseous finding. IMPRESSION: Large pericardial effusion. Small pleural effusions with bibasilar atelectasis Periportal hepatic edema and small amount of perihepatic ascites with probable scattered small hepatic cysts. No biliary obstruction. Gallbladder wall thickening suspected with pericholecystic fluid. Difficult to exclude cholecystitis. Consider ultrasound correlation. Gallbladder findings can also be seen with chronic hepatic disease. Nonobstructing nephrolithiasis Extensive aortoiliac atherosclerosis Persistent right sciatic artery noted, normal vascular variant. Electronically Signed   By: Judie Petit.  Shick M.D.   On: 07/26/2017 09:28   Ct Head Wo Contrast  Result Date:  07/26/2017 CLINICAL DATA:  71 year old female with right side deficit. EXAM: CT HEAD WITHOUT CONTRAST TECHNIQUE: Contiguous axial images were obtained from the base of the skull through the vertex without intravenous contrast. COMPARISON:  CT head 03/25/2017 and earlier. FINDINGS: Brain: No acute intracranial hemorrhage identified. No midline shift, mass effect, or evidence of intracranial mass lesion. No ventriculomegaly. Patchy bilateral white matter hypodensity, most pronounced about the left frontal horn, appears stable. No cortically based acute infarct identified. Vascular: Calcified atherosclerosis at the skull base. No suspicious intracranial vascular hyperdensity. Skull: Chronic hyperostosis of the skull. No acute osseous abnormality identified. Sinuses/Orbits: Visualized paranasal sinuses and mastoids are stable and well pneumatized. Other: No acute orbit, scalp, or visible face soft tissue abnormality. ASPECTS [facility] Stroke Program Early CT Score) - Ganglionic level infarction (caudate, lentiform nuclei, internal capsule, insula, M1-M3 cortex): 7 - Supraganglionic  infarction (M4-M6 cortex): 3 Total score (0-10 with 10 being normal): 10 IMPRESSION: 1. Chronic white matter disease. No acute cortically based infarct or intracranial hemorrhage identified. 2. ASPECTS is 10. 3. The above was relayed via text pager to Dr. Arther Dames on 07/26/2017 at 21:51 . Electronically Signed   By: Odessa Fleming M.D.   On: 07/26/2017 21:51   Mr Maxine Glenn Head Wo Contrast  Result Date: 07/26/2017 CLINICAL DATA:  71 y/o  F; code stroke with right-sided deficits. EXAM: MRI HEAD WITHOUT CONTRAST MRA HEAD WITHOUT CONTRAST TECHNIQUE: Multiplanar, multiecho pulse sequences of the brain and surrounding structures were obtained without intravenous contrast. Angiographic images of the head were obtained using MRA technique without contrast. COMPARISON:  07/26/2017 CT of the head. FINDINGS: MRI HEAD FINDINGS Brain: No acute infarction,  hemorrhage, hydrocephalus, extra-axial collection or mass lesion. Few nonspecific foci of T2 FLAIR hyperintense signal abnormality in subcortical and periventricular white matter is compatible with mild chronic microvascular ischemic changes for age. Moderate brain parenchymal volume loss. Vascular: As below. Skull and upper cervical spine: Normal marrow signal. Sinuses/Orbits: Trace mastoid effusions. Small left maxillary sinus mucous retention cyst. Bilateral intra-ocular lens replacement. Other: None. MRA HEAD FINDINGS Internal carotid arteries:  Patent. Anterior cerebral arteries:  Patent. Middle cerebral arteries: Patent. Anterior communicating artery: Patent. Posterior communicating arteries:  Patent. Posterior cerebral arteries:  Patent. Basilar artery:  Patent. Vertebral arteries:  Patent. No evidence of high-grade stenosis, large vessel occlusion, or aneurysm. IMPRESSION: MRI head: 1. No acute intracranial abnormality identified. 2. Mild for age chronic microvascular ischemic changes and moderate parenchymal volume loss of the brain. 3. Trace mastoid effusions. MRA head: Negative MRA of the head. No large vessel occlusion, aneurysm, or significant stenosis. Electronically Signed   By: Mitzi Hansen M.D.   On: 07/26/2017 22:56   Mr Brain Wo Contrast  Result Date: 07/26/2017 CLINICAL DATA:  71 y/o  F; code stroke with right-sided deficits. EXAM: MRI HEAD WITHOUT CONTRAST MRA HEAD WITHOUT CONTRAST TECHNIQUE: Multiplanar, multiecho pulse sequences of the brain and surrounding structures were obtained without intravenous contrast. Angiographic images of the head were obtained using MRA technique without contrast. COMPARISON:  07/26/2017 CT of the head. FINDINGS: MRI HEAD FINDINGS Brain: No acute infarction, hemorrhage, hydrocephalus, extra-axial collection or mass lesion. Few nonspecific foci of T2 FLAIR hyperintense signal abnormality in subcortical and periventricular white matter is compatible  with mild chronic microvascular ischemic changes for age. Moderate brain parenchymal volume loss. Vascular: As below. Skull and upper cervical spine: Normal marrow signal. Sinuses/Orbits: Trace mastoid effusions. Small left maxillary sinus mucous retention cyst. Bilateral intra-ocular lens replacement. Other: None. MRA HEAD FINDINGS Internal carotid arteries:  Patent. Anterior cerebral arteries:  Patent. Middle cerebral arteries: Patent. Anterior communicating artery: Patent. Posterior communicating arteries:  Patent. Posterior cerebral arteries:  Patent. Basilar artery:  Patent. Vertebral arteries:  Patent. No evidence of high-grade stenosis, large vessel occlusion, or aneurysm. IMPRESSION: MRI head: 1. No acute intracranial abnormality identified. 2. Mild for age chronic microvascular ischemic changes and moderate parenchymal volume loss of the brain. 3. Trace mastoid effusions. MRA head: Negative MRA of the head. No large vessel occlusion, aneurysm, or significant stenosis. Electronically Signed   By: Mitzi Hansen M.D.   On: 07/26/2017 22:56   Dg Chest Port 1 View  Result Date: 07/26/2017 CLINICAL DATA:  RIGHT chest pain. Dialysis patient. Pt is a dialysis pt who is supposed to have dialysis tomorrow. Pt reports that her dialysis port is bleeding and it was not  bleeding after dialysis Wednesday, states entire right side is hurting, right chest area. Hx of CAD. Diabetes. HTN, PAD, DM2 EXAM: PORTABLE CHEST 1 VIEW COMPARISON:  06/29/2017 FINDINGS: Cardiac silhouette is enlarged. Silhouette is enlarged compared to 06/29/2017. No effusion or infiltrate. No pulmonary edema. No pneumothorax for IMPRESSION: Increase cardiac silhouette compared to 06/29/2017. Differential includes cardiomegaly versus pericardial effusion. Electronically Signed   By: Genevive Bi M.D.   On: 07/26/2017 08:37    Patient Profile     71 y.o. female with past medical history of diabetes mellitus, for vascular disease,  hypertension, hyperlipidemia, end-stage renal disease dialysis dependent, coronary artery disease (Last cardiac catheterization performed in August 2017 showed a 60% first diagonal and a 50% posterior lateral) admitted with pericardial effusion/tamponade. Patient went emergently to the cardiac catheterization lab for pericardiocentesis. Patient also noted to be in new onset atrial fibrillation. Echocardiogram prior to pericardiocentesis showed normal LV systolic function, severe left ventricular hypertrophy, large pericardial effusion with  tamponade physiology.  Assessment & Plan    1 Pericardial effusion/tamponade-Patient much improved this morning following emergent pericardiocentesis. We will plan to repeat echocardiogram to reassess effusion. We will likely remove pericardial drain tomorrow. Gram stain negative. Await follow-up cultures and cytology. Etiology of effusion unclear.  2 new onset atrial fibrillation-patient has converted to sinus rhythm. I will change IV amiodarone to oral. Begin 400 mg twice a day for 1 week and then 200 mg daily thereafter. I think her atrial fibrillation was likely do to her pericardial effusion. Would plan to discontinue amiodarone in 8 weeks if she remains in sinus rhythm. CHADSvasc 5. However we will not anticoagulate at this point because of recent pericardiocentesis and risk of hemorrhagic transformation. I do not think she requires long-term anticoagulation unless she develops recurrent atrial fibrillation without recurrent pericardial effusion. Check TSH.  3 end-stage renal disease-dialysis dependent. Nephrology following.  4 shock-resolved following pericardiocentesis.  5 question cholecystitis-possible gallbladder wall thickening on CT scan. Ultrasound of gall bladder pending. Critical care medicine following. Continue antibiotics.  6 hypertension-follow blood pressure and resume preadmission meds as needed.  7 History of nonobstructive coronary  disease by cardiac catheterization August 2017.  Signed, Olga Millers, MD  07/27/2017, 7:40 AM

## 2017-07-28 DIAGNOSIS — E1121 Type 2 diabetes mellitus with diabetic nephropathy: Secondary | ICD-10-CM

## 2017-07-28 DIAGNOSIS — I313 Pericardial effusion (noninflammatory): Principal | ICD-10-CM

## 2017-07-28 DIAGNOSIS — N185 Chronic kidney disease, stage 5: Secondary | ICD-10-CM

## 2017-07-28 DIAGNOSIS — J9611 Chronic respiratory failure with hypoxia: Secondary | ICD-10-CM

## 2017-07-28 DIAGNOSIS — I2510 Atherosclerotic heart disease of native coronary artery without angina pectoris: Secondary | ICD-10-CM

## 2017-07-28 DIAGNOSIS — I480 Paroxysmal atrial fibrillation: Secondary | ICD-10-CM

## 2017-07-28 DIAGNOSIS — I10 Essential (primary) hypertension: Secondary | ICD-10-CM

## 2017-07-28 DIAGNOSIS — Z992 Dependence on renal dialysis: Secondary | ICD-10-CM

## 2017-07-28 DIAGNOSIS — R570 Cardiogenic shock: Secondary | ICD-10-CM

## 2017-07-28 DIAGNOSIS — D631 Anemia in chronic kidney disease: Secondary | ICD-10-CM

## 2017-07-28 DIAGNOSIS — G8191 Hemiplegia, unspecified affecting right dominant side: Secondary | ICD-10-CM

## 2017-07-28 DIAGNOSIS — N186 End stage renal disease: Secondary | ICD-10-CM

## 2017-07-28 DIAGNOSIS — I314 Cardiac tamponade: Secondary | ICD-10-CM

## 2017-07-28 DIAGNOSIS — I4891 Unspecified atrial fibrillation: Secondary | ICD-10-CM

## 2017-07-28 LAB — COMPREHENSIVE METABOLIC PANEL
ALK PHOS: 135 U/L — AB (ref 38–126)
ALT: 27 U/L (ref 14–54)
AST: 22 U/L (ref 15–41)
Albumin: 2.7 g/dL — ABNORMAL LOW (ref 3.5–5.0)
Anion gap: 16 — ABNORMAL HIGH (ref 5–15)
BUN: 67 mg/dL — ABNORMAL HIGH (ref 6–20)
CHLORIDE: 94 mmol/L — AB (ref 101–111)
CO2: 24 mmol/L (ref 22–32)
CREATININE: 7.48 mg/dL — AB (ref 0.44–1.00)
Calcium: 8.4 mg/dL — ABNORMAL LOW (ref 8.9–10.3)
GFR calc Af Amer: 6 mL/min — ABNORMAL LOW (ref >60)
GFR, EST NON AFRICAN AMERICAN: 5 mL/min — AB (ref >60)
Glucose, Bld: 166 mg/dL — ABNORMAL HIGH (ref 65–99)
Potassium: 4.2 mmol/L (ref 3.5–5.1)
Sodium: 134 mmol/L — ABNORMAL LOW (ref 135–145)
Total Bilirubin: 0.6 mg/dL (ref 0.3–1.2)
Total Protein: 6 g/dL — ABNORMAL LOW (ref 6.5–8.1)

## 2017-07-28 LAB — CBC
HCT: 29.1 % — ABNORMAL LOW (ref 36.0–46.0)
HEMOGLOBIN: 9.2 g/dL — AB (ref 12.0–15.0)
MCH: 27.2 pg (ref 26.0–34.0)
MCHC: 31.6 g/dL (ref 30.0–36.0)
MCV: 86.1 fL (ref 78.0–100.0)
PLATELETS: 535 10*3/uL — AB (ref 150–400)
RBC: 3.38 MIL/uL — AB (ref 3.87–5.11)
RDW: 17 % — ABNORMAL HIGH (ref 11.5–15.5)
WBC: 10.2 10*3/uL (ref 4.0–10.5)

## 2017-07-28 LAB — GLUCOSE, CAPILLARY
GLUCOSE-CAPILLARY: 102 mg/dL — AB (ref 65–99)
Glucose-Capillary: 192 mg/dL — ABNORMAL HIGH (ref 65–99)
Glucose-Capillary: 210 mg/dL — ABNORMAL HIGH (ref 65–99)

## 2017-07-28 LAB — MRSA CULTURE: Culture: NOT DETECTED

## 2017-07-28 LAB — TSH: TSH: 1.498 u[IU]/mL (ref 0.350–4.500)

## 2017-07-28 MED ORDER — SODIUM CHLORIDE 0.9 % IV SOLN: 100.0000 mL | INTRAVENOUS | Status: DC | PRN

## 2017-07-28 MED ORDER — LIDOCAINE-PRILOCAINE 2.5-2.5 % EX CREA: 1.0000 "application " | TOPICAL_CREAM | CUTANEOUS | Status: DC | PRN

## 2017-07-28 MED ORDER — HEPARIN SODIUM (PORCINE) 1000 UNIT/ML DIALYSIS: 1000.0000 [IU] | INTRAMUSCULAR | Status: DC | PRN

## 2017-07-28 MED ORDER — LIDOCAINE HCL (PF) 1 % IJ SOLN: 5.0000 mL | INTRAMUSCULAR | Status: DC | PRN

## 2017-07-28 MED ORDER — AMIODARONE HCL IN DEXTROSE 360-4.14 MG/200ML-% IV SOLN
30.0000 mg/h | INTRAVENOUS | Status: DC
  Administered 2017-07-29 – 2017-07-30 (×3): 30 mg/h via INTRAVENOUS
  Filled 2017-07-28 (×3): qty 200

## 2017-07-28 MED ORDER — AMIODARONE HCL IN DEXTROSE 360-4.14 MG/200ML-% IV SOLN
60.0000 mg/h | INTRAVENOUS | Status: AC
  Administered 2017-07-28 (×2): 60 mg/h via INTRAVENOUS
  Filled 2017-07-28 (×2): qty 200

## 2017-07-28 MED ORDER — AMIODARONE HCL 200 MG PO TABS: 400.0000 mg | ORAL_TABLET | Freq: Two times a day (BID) | ORAL | Status: DC

## 2017-07-28 MED ORDER — AMIODARONE LOAD VIA INFUSION
150.0000 mg | Freq: Once | INTRAVENOUS | Status: AC
  Administered 2017-07-28: 150 mg via INTRAVENOUS
  Filled 2017-07-28: qty 83.34

## 2017-07-28 MED ORDER — HYDROCODONE-ACETAMINOPHEN 5-325 MG PO TABS
ORAL_TABLET | ORAL | Status: AC
  Filled 2017-07-28: qty 1

## 2017-07-28 MED ORDER — INSULIN GLARGINE 100 UNIT/ML ~~LOC~~ SOLN
15.0000 [IU] | Freq: Every day | SUBCUTANEOUS | Status: DC
  Administered 2017-07-29 – 2017-08-01 (×3): 15 [IU] via SUBCUTANEOUS
  Filled 2017-07-28 (×4): qty 0.15

## 2017-07-28 MED ORDER — ALTEPLASE 2 MG IJ SOLR: 2.0000 mg | Freq: Once | INTRAMUSCULAR | Status: DC | PRN

## 2017-07-28 MED ORDER — PENTAFLUOROPROP-TETRAFLUOROETH EX AERO: 1.0000 "application " | INHALATION_SPRAY | CUTANEOUS | Status: DC | PRN

## 2017-07-28 NOTE — Progress Notes (Signed)
[facility] Progress Note  Subjective: no new c/o  Vitals:   07/28/17 1030 07/28/17 1050 07/28/17 1130 07/28/17 1204  BP: (!) 147/74 (!) 153/76    Pulse: (!) 108 (!) 105 (!) 128   Resp: 19 18 16    Temp:  98.3 F (36.8 C)  98.7 F (37.1 C)  TempSrc:  Oral  Oral  SpO2:  95% 95%   Weight:  72.9 kg (160 lb 11.5 oz)    Height:        Inpatient medications: . amiodarone  400 mg Oral BID  . calcium acetate  1,334 mg Oral TID WC  . cinacalcet  60 mg Oral Once per day on Mon Wed Fri  . famotidine  20 mg Oral QHS  . gabapentin  100 mg Oral TID  . heparin  5,000 Units Subcutaneous Q8H  . HYDROcodone-acetaminophen      . insulin aspart  0-15 Units Subcutaneous TID WC  . insulin aspart  0-5 Units Subcutaneous QHS  . insulin glargine  10 Units Subcutaneous Daily  . latanoprost  1 drop Both Eyes QHS  . mouth rinse  15 mL Mouth Rinse BID    acetaminophen, calcium acetate, cyclobenzaprine, diazepam, HYDROcodone-acetaminophen, ondansetron (ZOFRAN) IV  Exam: Alert, no distress No jvd Chest clear on L, dec'd R base RRR no mrg ABd soft ntnd no ascites, pericardial drain in subxiphoid space Ext no LE edema or wounds Ox 3, alert Thigh aVG +bruit  Dialysis:  MWF SW   4h  71kg  L thigh AVG  Hep 2500 (No heparin here d/t bloody pericard effusion) - Mircera 100mcg IV q 2 weeks (last given 75mcg on 8/22) - Hectoral 1mcg IV q HD   Assessment:   1. Pericardial effusion w/ tamponade - s/p urgent 500mL pericardiocentesis. Etiology unclear. Cytology pending. 2. ESRD: HD MWF.  HD today.  NO heparin.  Avoid all heparin for time being.  3. HTN: was on possibly five (5) BP meds at home and Imdur.  Holding for now.   4. Anemia: Hgb 9.3. Will be due for ESA this week. Follow. 5. Metabolic bone disease: Ca ok. Monitor. Will resume VDRA with next HD. 6. ?Incidental cholecystitis: On CT, per primary. getting Unasyn for now. 7. Type 2 DM: Per primary. 8. New onset atrial fibrillation:  On amiodarone; no anticoag due to #1.  Per primary and cardiology. 9. CAD (non-obstructive per 06/2016 [facility])  Plan - HD today, UF to dry wt, no heparin.    Vinson Moselleob Marianna Cid MD [facility] pager [phone]   07/28/2017, 12:53 PM    Recent Labs Lab 07/26/17 0818  07/26/17 2112 07/27/17 0339 07/28/17 0210  NA 132*  --  136 135 134*  K 4.5  --  3.9 3.5 4.2  CL 90*  --  93* 93* 94*  CO2 20*  --   --  25 24  GLUCOSE 520*  --  78 228* 166*  BUN 35*  --  42* 50* 67*  CREATININE 5.09*  < > 5.50* 5.93* 7.48*  CALCIUM 8.6*  --   --  7.8* 8.4*  < > = values in this interval not displayed.  Recent Labs Lab 07/26/17 0818 07/28/17 0210  AST 42* 22  ALT 25 27  ALKPHOS 132* 135*  BILITOT 0.6 0.6  PROT 6.2* 6.0*  ALBUMIN 2.9* 2.7*    Recent Labs Lab 07/26/17 0818 07/26/17 1411 07/26/17 2112 07/27/17 0339 07/28/17 0210  WBC 11.4* 12.4*  --  10.6* 10.2  NEUTROABS 8.6*  --   --   --   --   HGB 9.3* 9.7* 10.2* 8.2* 9.2*  HCT 30.2* 30.5* 30.0* 26.1* 29.1*  MCV 88.0 86.4  --  85.0 86.1  PLT 533* 497*  --  499* 535*   Iron/TIBC/Ferritin/ %Sat    Component Value Date/Time   IRON 53 08/17/2010 0330   TIBC 359 08/17/2010 0330   IRONPCTSAT 15 (L) 08/17/2010 0330

## 2017-07-28 NOTE — Progress Notes (Signed)
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 She notes that the pain has been present for several months after a fall back in April. However, it should be noted that getting an accurate history seems difficult as the patient is quite anxious. She was noted in the emergency department to have a large pericardial effusion. She denied fever, chills, recent postprandial pain. She did have some nausea and vomiting the morning of admission.    Subjective: 9/3 A/O 4, negative CP, negative SOB, negative abdominal pain, negative N/V. Patient states is on home O2 but only uses PRN (when she is short of breath). Does not know base weight, does not weigh herself daily. States came to [place] [facility] secondary to CP with radiation to right shoulder. Currently pain-free     Assessment & Plan:   Active Problems:   Tamponade   Pericardial effusion   Pericardial effusion/cardiac tamponade -S/P emergent pericardiocentesis by cardiology 9/1, secondary to cardiac tamponade -Pericardial drain removed 9/3    Cardiogenic shock -Resolved with resolving tamponade -Echocardiogram 9/2 normal LV function. -Care per cardiology    New Onset Atrial Fibrillation(Chadsvasc is 5) -Resolved on 9/1 post pericardiocentesis, however patient back in A. fib post HD -Amiodarone drip restarted  -Hold anticoagulation secondary to risk for hemorrhagic pericardial effusion -Transfuse for hemoglobin <8  Essential HTN -Controlled per HD -See A.  fib  Nonobstructive CAD -Noted on cardiac cath August 2017. Cardiology following  Chronic respiratory failure with hypoxia -Per patient only uses O2 at home when she feels she is short of breath. However when questioned about how she was instructed to use home O2, invasive -Ambulatory SPO2 prior to discharge   Acute RIGHT facial droop/RIGHT hemiparesis  -CT head/MRI/MRA brain negative for acute stroke -Stroke team has evaluated -PT/OT consulted   ESRD on HD M/W/F -HD per Nephrology     DM Type 2 controlled with renal complication -2/4 Hemoglobin A1C=5.6 -Increase Lantus 15 units -Moderate SSI    Cholecystitis? -Abdominal ultrasound negative for cholecystitis results below  Anemia of chronic kidney disease -No evidence of acute blood loss  Recent Labs Lab 07/26/17 0818 07/26/17 1411 07/26/17 2112 07/27/17 0339 07/28/17 0210  HGB 9.3* 9.7* 10.2* 8.2* 9.2*   -relatively stable     DVT prophylaxis: Subcutaneous heparin Code Status: Full Family Communication: None Disposition Plan: TBD   Consultants:  Stroke team Neurology Nephrology   Procedures/Significant Events:  9/1 CT head W Wo contrast:-Negative acute infarct-chronic white matter disease 9/1 MRI/MRA: Negative acute infarct.-Subcortical/periventricular white matter chronic microvascular ischemic changes -Negative MRA 9/2 Abdominal ultrasound RUQ: Negative cholecystitis-negative gallbladder wall thickening -Coarsened hepatic echotexture--> steatosis or hepatocellular disease      I have personally reviewed and interpreted all radiology studies and my findings are as above.  VENTILATOR SETTINGS: None   Cultures None  Antimicrobials: Anti-infectives    Start     Stop   07/26/17 2200  Ampicillin-Sulbactam (UNASYN) 3 g in sodium chloride 0.9 % 100 mL IVPB  Status:  Discontinued     07/27/17 0957   07/26/17 2100  Ampicillin-Sulbactam (UNASYN)  3 g in sodium chloride 0.9 % 100 mL IVPB  Status:   Discontinued     07/26/17 1320   07/26/17 0915  vancomycin (VANCOCIN) 1,500 mg in sodium chloride 0.9 % 500 mL IVPB     07/26/17 1026   07/26/17 0845  piperacillin-tazobactam (ZOSYN) IVPB 3.375 g     07/26/17 0922   07/26/17 0845  vancomycin (VANCOCIN) IVPB 1000 mg/200 mL premix  Status:  Discontinued     07/26/17 0903       Devices None   LINES / TUBES:  Pericardial drain 9/1>>> 9/3    Continuous Infusions:   Objective: Vitals:   07/28/17 0343 07/28/17 0400 07/28/17 0500 07/28/17 0600  BP:  (!) 175/77 (!) 189/71 (!) 161/71  Pulse:  68  69  Resp:  13 14 15   Temp: 98.6 F (37 C)     TempSrc: Oral     SpO2:  98%  100%  Weight:    167 lb 5.3 oz (75.9 kg)  Height:        Intake/Output Summary (Last 24 hours) at 07/28/17 1610 Last data filed at 07/27/17 2300  Gross per 24 hour  Intake            496.7 ml  Output                0 ml  Net            496.7 ml   Filed Weights   07/26/17 1235 07/27/17 0341 07/28/17 0600  Weight: 160 lb 15 oz (73 kg) 160 lb 15 oz (73 kg) 167 lb 5.3 oz (75.9 kg)    Examination:  General: A/O 4, No acute respiratory distress Eyes: negative scleral hemorrhage, negative anisocoria, negative icterus Neck:  Negative scars, masses, torticollis, lymphadenopathy, JVD Lungs: Clear to auscultation bilaterally without wheezes or crackles Cardiovascular: Irregular irregular rhythm and rate, without murmur gallop or rub normal S1 and S2 Abdomen: Morbidly obese, negative abdominal pain, nondistended, positive soft, bowel sounds, no rebound, no ascites, no appreciable mass Extremities: No significant cyanosis, clubbing, or edema bilateral lower extremities Skin: Negative rashes, lesions, ulcers Psychiatric:  Negative depression, negative anxiety, negative fatigue, negative mania  Central nervous system:  Cranial nerves II through XII intact, tongue/uvula midline, all extremities muscle strength 5/5, sensation intact throughout,  negative dysarthria,  negative expressive aphasia, negative receptive aphasia.  .     Data Reviewed: Care during the described time interval was provided by me .  I have reviewed this patient's available data, including medical history, events of note, physical examination, and all test results as part of my evaluation.   CBC:  Recent Labs Lab 07/26/17 0818 07/26/17 1411 07/26/17 2112 07/27/17 0339 07/28/17 0210  WBC 11.4* 12.4*  --  10.6* 10.2  NEUTROABS 8.6*  --   --   --   --   HGB 9.3* 9.7* 10.2* 8.2* 9.2*  HCT 30.2* 30.5* 30.0* 26.1* 29.1*  MCV 88.0 86.4  --  85.0 86.1  PLT 533* 497*  --  499* 535*   Basic Metabolic Panel:  Recent Labs Lab 07/26/17 0818 07/26/17 1411 07/26/17 2112 07/27/17 0339 07/28/17 0210  NA 132*  --  136 135 134*  K 4.5  --  3.9 3.5 4.2  CL 90*  --  93* 93* 94*  CO2 20*  --   --  25 24  GLUCOSE 520*  --  78 228* 166*  BUN 35*  --  42* 50* 67*  CREATININE 5.09* 5.16* 5.50* 5.93* 7.48*  CALCIUM 8.6*  --   --  7.8* 8.4*   GFR: Estimated Creatinine Clearance: 6.9 mL/min (A) (by C-G formula based on SCr of 7.48 mg/dL (H)). Liver Function Tests:  Recent Labs Lab 07/26/17 0818 07/28/17 0210  AST 42* 22  ALT 25 27  ALKPHOS 132* 135*  BILITOT 0.6 0.6  PROT 6.2* 6.0*  ALBUMIN 2.9* 2.7*    Recent Labs Lab 07/26/17 0818  LIPASE 24   No results for input(s): AMMONIA in the last 168 hours. Coagulation Profile: No results for input(s): INR, PROTIME in the last 168 hours. Cardiac Enzymes: No results for input(s): CKTOTAL, CKMB, CKMBINDEX, TROPONINI in the last 168 hours. BNP (last 3 results) No results for input(s): PROBNP in the last 8760 hours. HbA1C: No results for input(s): HGBA1C in the last 72 hours. CBG:  Recent Labs Lab 07/27/17 0338 07/27/17 0754 07/27/17 1158 07/27/17 1558 07/27/17 2104  GLUCAP 239* 178* 328* 242* 264*   Lipid Profile: No results for input(s): CHOL, HDL, LDLCALC, TRIG, CHOLHDL, LDLDIRECT in the last 72 hours. Thyroid  Function Tests: No results for input(s): TSH, T4TOTAL, FREET4, T3FREE, THYROIDAB in the last 72 hours. Anemia Panel: No results for input(s): VITAMINB12, FOLATE, FERRITIN, TIBC, IRON, RETICCTPCT in the last 72 hours. Urine analysis:    Component Value Date/Time   COLORURINE YELLOW 10/16/2015 1847   APPEARANCEUR CLEAR 10/16/2015 1847   LABSPEC 1.013 10/16/2015 1847   PHURINE 8.5 (H) 10/16/2015 1847   GLUCOSEU NEGATIVE 10/16/2015 1847   HGBUR SMALL (A) 10/16/2015 1847   BILIRUBINUR NEGATIVE 10/16/2015 1847   KETONESUR NEGATIVE 10/16/2015 1847   PROTEINUR >300 (A) 10/16/2015 1847   UROBILINOGEN 0.2 12/11/2012 0719   NITRITE NEGATIVE 10/16/2015 1847   LEUKOCYTESUR NEGATIVE 10/16/2015 1847   Sepsis Labs: @LABRCNTIP (procalcitonin:4,lacticidven:4)  ) Recent Results (from the past 240 hour(s))  Culture, blood (Routine X 2) w Reflex to ID Panel     Status: None (Preliminary result)   Collection Time: 07/26/17  8:18 AM  Result Value Ref Range Status   Specimen Description BLOOD LEFT HAND  Final   Special Requests   Final    BOTTLES DRAWN AEROBIC ONLY Blood Culture adequate volume   Culture NO GROWTH 1 DAY  Final   Report Status PENDING  Incomplete  Culture, blood (Routine X 2) w Reflex to ID Panel     Status: None (Preliminary result)   Collection Time: 07/26/17  9:30 AM  Result Value Ref Range Status   Specimen Description BLOOD LEFT HAND  Final   Special Requests   Final    BOTTLES DRAWN AEROBIC AND ANAEROBIC Blood Culture adequate volume   Culture NO GROWTH 1 DAY  Final   Report Status PENDING  Incomplete  Culture, body fluid-bottle     Status: None (Preliminary result)   Collection Time: 07/26/17 11:13 AM  Result Value Ref Range Status   Specimen Description FLUID PERICARDIAL  Final   Special Requests NONE  Final   Culture NO GROWTH < 24 HOURS  Final   Report Status PENDING  Incomplete  Gram stain     Status: None   Collection Time: 07/26/17 11:13 AM  Result Value Ref  Range Status   Specimen Description FLUID PERICARDIAL  Final   Special Requests NONE  Final   Gram Stain   Final    FEW WBC PRESENT, PREDOMINANTLY PMN NO ORGANISMS SEEN    Report Status 07/26/2017 FINAL  Final  MRSA PCR Screening  Status: Abnormal   Collection Time: 07/26/17 12:35 PM  Result Value Ref Range Status   MRSA by PCR INVALID RESULTS, SPECIMEN SENT FOR CULTURE (A) NEGATIVE Final    Comment: RESULT CALLED TO, READ BACK BY AND VERIFIED WITH: S HINTZ,RN AT 1736 07/26/17 BY L BENFIELD        The GeneXpert MRSA Assay (FDA approved for NASAL specimens only), is one component of a comprehensive MRSA colonization surveillance program. It is not intended to diagnose MRSA infection nor to guide or monitor treatment for MRSA infections.   MRSA culture     Status: None (Preliminary result)   Collection Time: 07/26/17 12:35 PM  Result Value Ref Range Status   Specimen Description NASAL SWAB  Final   Special Requests NONE  Final   Culture CULTURE REINCUBATED FOR BETTER GROWTH  Final   Report Status PENDING  Incomplete  Culture, blood (routine x 2)     Status: None (Preliminary result)   Collection Time: 07/26/17  2:11 PM  Result Value Ref Range Status   Specimen Description BLOOD LEFT ARM  Final   Special Requests IN PEDIATRIC BOTTLE Blood Culture adequate volume  Final   Culture NO GROWTH < 24 HOURS  Final   Report Status PENDING  Incomplete  Culture, blood (routine x 2)     Status: None (Preliminary result)   Collection Time: 07/26/17  2:11 PM  Result Value Ref Range Status   Specimen Description BLOOD LEFT HAND  Final   Special Requests IN PEDIATRIC BOTTLE Blood Culture adequate volume  Final   Culture NO GROWTH < 24 HOURS  Final   Report Status PENDING  Incomplete         Radiology Studies: Ct Abdomen Pelvis Wo Contrast  Result Date: 07/26/2017 CLINICAL DATA:  Mid abdominal pain with nausea and vomiting. End-stage renal disease, dialysis dependent. EXAM: CT  ABDOMEN AND PELVIS WITHOUT CONTRAST TECHNIQUE: Multidetector CT imaging of the abdomen and pelvis was performed following the standard protocol without IV contrast. COMPARISON:  07/26/2017 chest x-ray 07/30/2015 CT without contrast FINDINGS: Lower chest: Large pericardial effusion. Small pleural effusions, slightly larger on the left. Aorta is atherosclerotic. Streaky bibasilar atelectasis. Degenerative changes of the lower thoracic spine. Hepatobiliary: Limited assessment without IV contrast. Diffuse periportal edema evident with small amount of perihepatic ascites on the right. Similar pattern of scattered small indeterminate hepatic hypodensities all measuring 10 mm or less in size, suspect small scattered hepatic cysts. Gallbladder demonstrates wall thickening and pericholecystic fluid. No surrounding inflammation. However, difficult to exclude cholecystitis. Consider correlation with ultrasound. No gross biliary obstruction. Gallbladder findings can also be seen with chronic hepatic disease. Pancreas: Limited assess without contrast. No large focal abnormality or ductal dilatation. No surrounding fluid collection or inflammation. Spleen: Normal in size without focal abnormality. Adrenals/Urinary Tract: Normal adrenal glands for age. Kidneys demonstrate chronic perinephric strandy edema. Nonobstructing intrarenal calculi bilaterally in the lower poles. Renal vascular calcifications also evident. No acute obstructive uropathy or hydronephrosis. Negative for hydroureter or ureteral obstruction. Urinary bladder collapsed. Stomach/Bowel: Negative for bowel obstruction, significant dilatation, ileus, or free air. Scattered colonic diverticulosis. Appendix is unremarkable. No fluid collection or abscess. Moderate stool burden throughout the colon. Vascular/Lymphatic: Extensive calcific atherosclerosis of the aorta and iliac vessels. No retroperitoneal hemorrhage or hematoma. Left femoral access graft noted. On the  right, the right internal iliac artery continues as a persistent right sciatic artery, normal arterial variant anatomy. Reproductive: Remote hysterectomy. No adnexal mass or abnormality. No fluid collection or  abscess. Other: No inguinal or abdominal wall hernia. Musculoskeletal: Degenerative changes of the spine SI joints. Vacuum disc phenomena at L4-5 and L5-S1. No acute osseous finding. IMPRESSION: Large pericardial effusion. Small pleural effusions with bibasilar atelectasis Periportal hepatic edema and small amount of perihepatic ascites with probable scattered small hepatic cysts. No biliary obstruction. Gallbladder wall thickening suspected with pericholecystic fluid. Difficult to exclude cholecystitis. Consider ultrasound correlation. Gallbladder findings can also be seen with chronic hepatic disease. Nonobstructing nephrolithiasis Extensive aortoiliac atherosclerosis Persistent right sciatic artery noted, normal vascular variant. Electronically Signed   By: Judie Petit.  Shick M.D.   On: 07/26/2017 09:28   Ct Head Wo Contrast  Result Date: 07/26/2017 CLINICAL DATA:  71 year old female with right side deficit. EXAM: CT HEAD WITHOUT CONTRAST TECHNIQUE: Contiguous axial images were obtained from the base of the skull through the vertex without intravenous contrast. COMPARISON:  CT head 03/25/2017 and earlier. FINDINGS: Brain: No acute intracranial hemorrhage identified. No midline shift, mass effect, or evidence of intracranial mass lesion. No ventriculomegaly. Patchy bilateral white matter hypodensity, most pronounced about the left frontal horn, appears stable. No cortically based acute infarct identified. Vascular: Calcified atherosclerosis at the skull base. No suspicious intracranial vascular hyperdensity. Skull: Chronic hyperostosis of the skull. No acute osseous abnormality identified. Sinuses/Orbits: Visualized paranasal sinuses and mastoids are stable and well pneumatized. Other: No acute orbit, scalp, or  visible face soft tissue abnormality. ASPECTS [facility] Stroke Program Early CT Score) - Ganglionic level infarction (caudate, lentiform nuclei, internal capsule, insula, M1-M3 cortex): 7 - Supraganglionic infarction (M4-M6 cortex): 3 Total score (0-10 with 10 being normal): 10 IMPRESSION: 1. Chronic white matter disease. No acute cortically based infarct or intracranial hemorrhage identified. 2. ASPECTS is 10. 3. The above was relayed via text pager to Dr. Arther Dames on 07/26/2017 at 21:51 . Electronically Signed   By: Odessa Fleming M.D.   On: 07/26/2017 21:51   Mr Maxine Glenn Head Wo Contrast  Result Date: 07/26/2017 CLINICAL DATA:  71 y/o  F; code stroke with right-sided deficits. EXAM: MRI HEAD WITHOUT CONTRAST MRA HEAD WITHOUT CONTRAST TECHNIQUE: Multiplanar, multiecho pulse sequences of the brain and surrounding structures were obtained without intravenous contrast. Angiographic images of the head were obtained using MRA technique without contrast. COMPARISON:  07/26/2017 CT of the head. FINDINGS: MRI HEAD FINDINGS Brain: No acute infarction, hemorrhage, hydrocephalus, extra-axial collection or mass lesion. Few nonspecific foci of T2 FLAIR hyperintense signal abnormality in subcortical and periventricular white matter is compatible with mild chronic microvascular ischemic changes for age. Moderate brain parenchymal volume loss. Vascular: As below. Skull and upper cervical spine: Normal marrow signal. Sinuses/Orbits: Trace mastoid effusions. Small left maxillary sinus mucous retention cyst. Bilateral intra-ocular lens replacement. Other: None. MRA HEAD FINDINGS Internal carotid arteries:  Patent. Anterior cerebral arteries:  Patent. Middle cerebral arteries: Patent. Anterior communicating artery: Patent. Posterior communicating arteries:  Patent. Posterior cerebral arteries:  Patent. Basilar artery:  Patent. Vertebral arteries:  Patent. No evidence of high-grade stenosis, large vessel occlusion, or aneurysm. IMPRESSION:  MRI head: 1. No acute intracranial abnormality identified. 2. Mild for age chronic microvascular ischemic changes and moderate parenchymal volume loss of the brain. 3. Trace mastoid effusions. MRA head: Negative MRA of the head. No large vessel occlusion, aneurysm, or significant stenosis. Electronically Signed   By: Mitzi Hansen M.D.   On: 07/26/2017 22:56   Mr Brain Wo Contrast  Result Date: 07/26/2017 CLINICAL DATA:  71 y/o  F; code stroke with right-sided deficits. EXAM: MRI HEAD WITHOUT CONTRAST  MRA HEAD WITHOUT CONTRAST TECHNIQUE: Multiplanar, multiecho pulse sequences of the brain and surrounding structures were obtained without intravenous contrast. Angiographic images of the head were obtained using MRA technique without contrast. COMPARISON:  07/26/2017 CT of the head. FINDINGS: MRI HEAD FINDINGS Brain: No acute infarction, hemorrhage, hydrocephalus, extra-axial collection or mass lesion. Few nonspecific foci of T2 FLAIR hyperintense signal abnormality in subcortical and periventricular white matter is compatible with mild chronic microvascular ischemic changes for age. Moderate brain parenchymal volume loss. Vascular: As below. Skull and upper cervical spine: Normal marrow signal. Sinuses/Orbits: Trace mastoid effusions. Small left maxillary sinus mucous retention cyst. Bilateral intra-ocular lens replacement. Other: None. MRA HEAD FINDINGS Internal carotid arteries:  Patent. Anterior cerebral arteries:  Patent. Middle cerebral arteries: Patent. Anterior communicating artery: Patent. Posterior communicating arteries:  Patent. Posterior cerebral arteries:  Patent. Basilar artery:  Patent. Vertebral arteries:  Patent. No evidence of high-grade stenosis, large vessel occlusion, or aneurysm. IMPRESSION: MRI head: 1. No acute intracranial abnormality identified. 2. Mild for age chronic microvascular ischemic changes and moderate parenchymal volume loss of the brain. 3. Trace mastoid effusions.  MRA head: Negative MRA of the head. No large vessel occlusion, aneurysm, or significant stenosis. Electronically Signed   By: Mitzi HansenLance  Furusawa-Stratton M.D.   On: 07/26/2017 22:56   Dg Chest Port 1 View  Result Date: 07/26/2017 CLINICAL DATA:  RIGHT chest pain. Dialysis patient. Pt is a dialysis pt who is supposed to have dialysis tomorrow. Pt reports that her dialysis port is bleeding and it was not bleeding after dialysis Wednesday, states entire right side is hurting, right chest area. Hx of CAD. Diabetes. HTN, PAD, DM2 EXAM: PORTABLE CHEST 1 VIEW COMPARISON:  06/29/2017 FINDINGS: Cardiac silhouette is enlarged. Silhouette is enlarged compared to 06/29/2017. No effusion or infiltrate. No pulmonary edema. No pneumothorax for IMPRESSION: Increase cardiac silhouette compared to 06/29/2017. Differential includes cardiomegaly versus pericardial effusion. Electronically Signed   By: Genevive BiStewart  Edmunds M.D.   On: 07/26/2017 08:37   [place] Abdomen Limited Ruq  Result Date: 07/27/2017 CLINICAL DATA:  71 year old who presented yesterday with mid abdominal pain, nausea and vomiting. CT yesterday questioned gallbladder wall thickening. Ultrasound requested to evaluate for possible cholecystitis EXAM: ULTRASOUND ABDOMEN LIMITED RIGHT UPPER QUADRANT COMPARISON:  CT abdomen and pelvis yesterday.  No prior ultrasound. FINDINGS: Gallbladder: No evidence of gallbladder wall thickening as questioned on yesterday's CT. No shadowing gallstones or gallbladder sludge. Minimal free fluid surrounding the gallbladder. Negative sonographic Murphy's sign according to the ultrasound technologist. Common bile duct: Diameter: Approximately 2 mm. Liver: Coarsened echotexture diffusely without focal hepatic parenchymal abnormality. Portal vein is patent on color Doppler imaging with normal direction of blood flow towards the liver. IMPRESSION: 1. No sonographic evidence of cholecystitis. No evidence of gallbladder wall thickening as questioned on  yesterday's CT. 2. Coarsened hepatic echotexture indicating steatosis and/or hepatocellular disease. No focal hepatic parenchymal abnormality. Electronically Signed   By: Hulan Saashomas  Lawrence M.D.   On: 07/27/2017 09:32        Scheduled Meds: . amiodarone  400 mg Oral Daily  . calcium acetate  1,334 mg Oral TID WC  . cinacalcet  60 mg Oral Once per day on Mon Wed Fri  . famotidine  20 mg Oral QHS  . gabapentin  100 mg Oral TID  . heparin  5,000 Units Subcutaneous Q8H  . insulin aspart  0-15 Units Subcutaneous TID WC  . insulin aspart  0-5 Units Subcutaneous QHS  . insulin glargine  10 Units Subcutaneous Daily  .  latanoprost  1 drop Both Eyes QHS  . mouth rinse  15 mL Mouth Rinse BID   Continuous Infusions:   LOS: 2 days    Time spent: 40 minutes    Brolin Dambrosia, Roselind Messier, MD [facility] Pager [phone]   If 7PM-7AM, please contact night-coverage [email or web address] Password [email or web address] 07/28/2017, 7:22 AM

## 2017-07-28 NOTE — Progress Notes (Signed)
Received pt from HD @ 1130. Pt was was NSR prior to HD and was in Afib w/ hr 120s on arrival. [facility] notified  Of change in status and reported that information would be communicated with MD Diona BrownerMcDowell.

## 2017-07-28 NOTE — Progress Notes (Signed)
Seen after HD. While in HD converted back to atrial fibrillation. Pericardial drain removed without complications. Restart IV amiodarone for rhythm/rate control.  Thurmon FairMihai Cornesha Radziewicz, MD, [facility] [phone] office [phone] pager

## 2017-07-28 NOTE — Progress Notes (Signed)
Progress Note  Patient Name: Colleen Mullins Date of Encounter: 07/28/2017  Primary Cardiologist: Dr Mayford Knife  Subjective   No complaints of CP or SOB today  Inpatient Medications    Scheduled Meds: . amiodarone  400 mg Oral Daily  . calcium acetate  1,334 mg Oral TID WC  . cinacalcet  60 mg Oral Once per day on Mon Wed Fri  . famotidine  20 mg Oral QHS  . gabapentin  100 mg Oral TID  . heparin  5,000 Units Subcutaneous Q8H  . insulin aspart  0-15 Units Subcutaneous TID WC  . insulin aspart  0-5 Units Subcutaneous QHS  . insulin glargine  10 Units Subcutaneous Daily  . latanoprost  1 drop Both Eyes QHS  . mouth rinse  15 mL Mouth Rinse BID   Continuous Infusions:  PRN Meds: acetaminophen, calcium acetate, cyclobenzaprine, diazepam, HYDROcodone-acetaminophen, ondansetron (ZOFRAN) IV   Vital Signs    Vitals:   07/28/17 0343 07/28/17 0400 07/28/17 0500 07/28/17 0600  BP:  (!) 175/77 (!) 189/71 (!) 161/71  Pulse:  68  69  Resp:  13 14 15   Temp: 98.6 F (37 C)     TempSrc: Oral     SpO2:  98%  100%  Weight:    167 lb 5.3 oz (75.9 kg)  Height:        Intake/Output Summary (Last 24 hours) at 07/28/17 0806 Last data filed at 07/27/17 2300  Gross per 24 hour  Intake              480 ml  Output                0 ml  Net              480 ml   Filed Weights   07/26/17 1235 07/27/17 0341 07/28/17 0600  Weight: 160 lb 15 oz (73 kg) 160 lb 15 oz (73 kg) 167 lb 5.3 oz (75.9 kg)    Telemetry    Atrial fibrillation converting to sinus- Personally Reviewed   Physical Exam   GEN: WD, WN in NAD Neck: no JVD Cardiac: RRR with no M/R/G Respiratory: CTA bilaterally GI: soft, NT, ND with active BS  MS: no edema Neuro: A&O x 3 Psych: normal affect  Labs    Chemistry  Recent Labs Lab 07/26/17 0818 07/26/17 1411 07/26/17 2112 07/27/17 0339 07/28/17 0210  NA 132*  --  136 135 134*  K 4.5  --  3.9 3.5 4.2  CL 90*  --  93* 93* 94*  CO2 20*  --   --  25 24    GLUCOSE 520*  --  78 228* 166*  BUN 35*  --  42* 50* 67*  CREATININE 5.09* 5.16* 5.50* 5.93* 7.48*  CALCIUM 8.6*  --   --  7.8* 8.4*  PROT 6.2*  --   --   --  6.0*  ALBUMIN 2.9*  --   --   --  2.7*  AST 42*  --   --   --  22  ALT 25  --   --   --  27  ALKPHOS 132*  --   --   --  135*  BILITOT 0.6  --   --   --  0.6  GFRNONAA 8* 8*  --  6* 5*  GFRAA 9* 9*  --  7* 6*  ANIONGAP 22*  --   --  17* 16*     Hematology  Recent Labs Lab 07/26/17 1411 07/26/17 2112 07/27/17 0339 07/28/17 0210  WBC 12.4*  --  10.6* 10.2  RBC 3.53*  --  3.07* 3.38*  HGB 9.7* 10.2* 8.2* 9.2*  HCT 30.5* 30.0* 26.1* 29.1*  MCV 86.4  --  85.0 86.1  MCH 27.5  --  26.7 27.2  MCHC 31.8  --  31.4 31.6  RDW 17.4*  --  16.9* 17.0*  PLT 497*  --  499* 535*     Radiology    Ct Abdomen Pelvis Wo Contrast  Result Date: 07/26/2017 CLINICAL DATA:  Mid abdominal pain with nausea and vomiting. End-stage renal disease, dialysis dependent. EXAM: CT ABDOMEN AND PELVIS WITHOUT CONTRAST TECHNIQUE: Multidetector CT imaging of the abdomen and pelvis was performed following the standard protocol without IV contrast. COMPARISON:  07/26/2017 chest x-ray 07/30/2015 CT without contrast FINDINGS: Lower chest: Large pericardial effusion. Small pleural effusions, slightly larger on the left. Aorta is atherosclerotic. Streaky bibasilar atelectasis. Degenerative changes of the lower thoracic spine. Hepatobiliary: Limited assessment without IV contrast. Diffuse periportal edema evident with small amount of perihepatic ascites on the right. Similar pattern of scattered small indeterminate hepatic hypodensities all measuring 10 mm or less in size, suspect small scattered hepatic cysts. Gallbladder demonstrates wall thickening and pericholecystic fluid. No surrounding inflammation. However, difficult to exclude cholecystitis. Consider correlation with ultrasound. No gross biliary obstruction. Gallbladder findings can also be seen with chronic  hepatic disease. Pancreas: Limited assess without contrast. No large focal abnormality or ductal dilatation. No surrounding fluid collection or inflammation. Spleen: Normal in size without focal abnormality. Adrenals/Urinary Tract: Normal adrenal glands for age. Kidneys demonstrate chronic perinephric strandy edema. Nonobstructing intrarenal calculi bilaterally in the lower poles. Renal vascular calcifications also evident. No acute obstructive uropathy or hydronephrosis. Negative for hydroureter or ureteral obstruction. Urinary bladder collapsed. Stomach/Bowel: Negative for bowel obstruction, significant dilatation, ileus, or free air. Scattered colonic diverticulosis. Appendix is unremarkable. No fluid collection or abscess. Moderate stool burden throughout the colon. Vascular/Lymphatic: Extensive calcific atherosclerosis of the aorta and iliac vessels. No retroperitoneal hemorrhage or hematoma. Left femoral access graft noted. On the right, the right internal iliac artery continues as a persistent right sciatic artery, normal arterial variant anatomy. Reproductive: Remote hysterectomy. No adnexal mass or abnormality. No fluid collection or abscess. Other: No inguinal or abdominal wall hernia. Musculoskeletal: Degenerative changes of the spine SI joints. Vacuum disc phenomena at L4-5 and L5-S1. No acute osseous finding. IMPRESSION: Large pericardial effusion. Small pleural effusions with bibasilar atelectasis Periportal hepatic edema and small amount of perihepatic ascites with probable scattered small hepatic cysts. No biliary obstruction. Gallbladder wall thickening suspected with pericholecystic fluid. Difficult to exclude cholecystitis. Consider ultrasound correlation. Gallbladder findings can also be seen with chronic hepatic disease. Nonobstructing nephrolithiasis Extensive aortoiliac atherosclerosis Persistent right sciatic artery noted, normal vascular variant. Electronically Signed   By: Judie Petit.  Shick M.D.    On: 07/26/2017 09:28   Ct Head Wo Contrast  Result Date: 07/26/2017 CLINICAL DATA:  71 year old female with right side deficit. EXAM: CT HEAD WITHOUT CONTRAST TECHNIQUE: Contiguous axial images were obtained from the base of the skull through the vertex without intravenous contrast. COMPARISON:  CT head 03/25/2017 and earlier. FINDINGS: Brain: No acute intracranial hemorrhage identified. No midline shift, mass effect, or evidence of intracranial mass lesion. No ventriculomegaly. Patchy bilateral white matter hypodensity, most pronounced about the left frontal horn, appears stable. No cortically based acute infarct identified. Vascular: Calcified atherosclerosis at the skull base. No suspicious intracranial vascular hyperdensity. Skull:  Chronic hyperostosis of the skull. No acute osseous abnormality identified. Sinuses/Orbits: Visualized paranasal sinuses and mastoids are stable and well pneumatized. Other: No acute orbit, scalp, or visible face soft tissue abnormality. ASPECTS [facility]([place] Stroke Program Early CT Score) - Ganglionic level infarction (caudate, lentiform nuclei, internal capsule, insula, M1-M3 cortex): 7 - Supraganglionic infarction (M4-M6 cortex): 3 Total score (0-10 with 10 being normal): 10 IMPRESSION: 1. Chronic white matter disease. No acute cortically based infarct or intracranial hemorrhage identified. 2. ASPECTS is 10. 3. The above was relayed via text pager to Dr. Arther DamesSUSHANTH AROOR on 07/26/2017 at 21:51 . Electronically Signed   By: Odessa FlemingH  Hall M.D.   On: 07/26/2017 21:51   Mr Maxine GlennMra Head Wo Contrast  Result Date: 07/26/2017 CLINICAL DATA:  71 y/o  F; code stroke with right-sided deficits. EXAM: MRI HEAD WITHOUT CONTRAST MRA HEAD WITHOUT CONTRAST TECHNIQUE: Multiplanar, multiecho pulse sequences of the brain and surrounding structures were obtained without intravenous contrast. Angiographic images of the head were obtained using MRA technique without contrast. COMPARISON:  07/26/2017 CT of the head.  FINDINGS: MRI HEAD FINDINGS Brain: No acute infarction, hemorrhage, hydrocephalus, extra-axial collection or mass lesion. Few nonspecific foci of T2 FLAIR hyperintense signal abnormality in subcortical and periventricular white matter is compatible with mild chronic microvascular ischemic changes for age. Moderate brain parenchymal volume loss. Vascular: As below. Skull and upper cervical spine: Normal marrow signal. Sinuses/Orbits: Trace mastoid effusions. Small left maxillary sinus mucous retention cyst. Bilateral intra-ocular lens replacement. Other: None. MRA HEAD FINDINGS Internal carotid arteries:  Patent. Anterior cerebral arteries:  Patent. Middle cerebral arteries: Patent. Anterior communicating artery: Patent. Posterior communicating arteries:  Patent. Posterior cerebral arteries:  Patent. Basilar artery:  Patent. Vertebral arteries:  Patent. No evidence of high-grade stenosis, large vessel occlusion, or aneurysm. IMPRESSION: MRI head: 1. No acute intracranial abnormality identified. 2. Mild for age chronic microvascular ischemic changes and moderate parenchymal volume loss of the brain. 3. Trace mastoid effusions. MRA head: Negative MRA of the head. No large vessel occlusion, aneurysm, or significant stenosis. Electronically Signed   By: Mitzi HansenLance  Furusawa-Stratton M.D.   On: 07/26/2017 22:56   Mr Brain Wo Contrast  Result Date: 07/26/2017 CLINICAL DATA:  71 y/o  F; code stroke with right-sided deficits. EXAM: MRI HEAD WITHOUT CONTRAST MRA HEAD WITHOUT CONTRAST TECHNIQUE: Multiplanar, multiecho pulse sequences of the brain and surrounding structures were obtained without intravenous contrast. Angiographic images of the head were obtained using MRA technique without contrast. COMPARISON:  07/26/2017 CT of the head. FINDINGS: MRI HEAD FINDINGS Brain: No acute infarction, hemorrhage, hydrocephalus, extra-axial collection or mass lesion. Few nonspecific foci of T2 FLAIR hyperintense signal abnormality in  subcortical and periventricular white matter is compatible with mild chronic microvascular ischemic changes for age. Moderate brain parenchymal volume loss. Vascular: As below. Skull and upper cervical spine: Normal marrow signal. Sinuses/Orbits: Trace mastoid effusions. Small left maxillary sinus mucous retention cyst. Bilateral intra-ocular lens replacement. Other: None. MRA HEAD FINDINGS Internal carotid arteries:  Patent. Anterior cerebral arteries:  Patent. Middle cerebral arteries: Patent. Anterior communicating artery: Patent. Posterior communicating arteries:  Patent. Posterior cerebral arteries:  Patent. Basilar artery:  Patent. Vertebral arteries:  Patent. No evidence of high-grade stenosis, large vessel occlusion, or aneurysm. IMPRESSION: MRI head: 1. No acute intracranial abnormality identified. 2. Mild for age chronic microvascular ischemic changes and moderate parenchymal volume loss of the brain. 3. Trace mastoid effusions. MRA head: Negative MRA of the head. No large vessel occlusion, aneurysm, or significant stenosis. Electronically Signed  By: Mitzi Hansen M.D.   On: 07/26/2017 22:56   Dg Chest Port 1 View  Result Date: 07/26/2017 CLINICAL DATA:  RIGHT chest pain. Dialysis patient. Pt is a dialysis pt who is supposed to have dialysis tomorrow. Pt reports that her dialysis port is bleeding and it was not bleeding after dialysis Wednesday, states entire right side is hurting, right chest area. Hx of CAD. Diabetes. HTN, PAD, DM2 EXAM: PORTABLE CHEST 1 VIEW COMPARISON:  06/29/2017 FINDINGS: Cardiac silhouette is enlarged. Silhouette is enlarged compared to 06/29/2017. No effusion or infiltrate. No pulmonary edema. No pneumothorax for IMPRESSION: Increase cardiac silhouette compared to 06/29/2017. Differential includes cardiomegaly versus pericardial effusion. Electronically Signed   By: Genevive Bi M.D.   On: 07/26/2017 08:37   US Abdomen Limited Ruq  Result Date:  07/27/2017 CLINICAL DATA:  71 year old who presented yesterday with mid abdominal pain, nausea and vomiting. CT yesterday questioned gallbladder wall thickening. Ultrasound requested to evaluate for possible cholecystitis EXAM: ULTRASOUND ABDOMEN LIMITED RIGHT UPPER QUADRANT COMPARISON:  CT abdomen and pelvis yesterday.  No prior ultrasound. FINDINGS: Gallbladder: No evidence of gallbladder wall thickening as questioned on yesterday's CT. No shadowing gallstones or gallbladder sludge. Minimal free fluid surrounding the gallbladder. Negative sonographic Murphy's sign according to the ultrasound technologist. Common bile duct: Diameter: Approximately 2 mm. Liver: Coarsened echotexture diffusely without focal hepatic parenchymal abnormality. Portal vein is patent on color Doppler imaging with normal direction of blood flow towards the liver. IMPRESSION: 1. No sonographic evidence of cholecystitis. No evidence of gallbladder wall thickening as questioned on yesterday's CT. 2. Coarsened hepatic echotexture indicating steatosis and/or hepatocellular disease. No focal hepatic parenchymal abnormality. Electronically Signed   By: Hulan Saas M.D.   On: 07/27/2017 09:32    Patient Profile     71 y.o. female with past medical history of diabetes mellitus, vascular disease, hypertension, hyperlipidemia, end-stage renal disease dialysis dependent, coronary artery disease (Last cardiac catheterization performed in August 2017 showed a 60% first diagonal and a 50% posterior lateral) admitted with pericardial effusion/tamponade. Patient went emergently to the cardiac catheterization lab for pericardiocentesis. Patient also noted to be in new onset atrial fibrillation. Echocardiogram prior to pericardiocentesis showed normal LV systolic function, severe left ventricular hypertrophy, large pericardial effusion with  tamponade physiology.  Assessment & Plan    1 Pericardial effusion/tamponade-much improved  following  emergent pericardiocentesis.  - repeat echo yesterday with no effusion.  - Will remove pericardial drain today - Gram stain negative - Await follow-up cultures and cytology. Etiology of effusion unclear.  2 new onset atrial fibrillation-patient has converted to sinus rhythm. I - continue Amio PO 400 mg twice a day for 1 week and then 200 mg daily thereafter.  - Atrial fibrillation likely do to her pericardial effusion so would plan to discontinue amiodarone in 8 weeks if she remains in sinus rhythm.  - CHADSvasc 5.but will not anticoagulate at this point because of recent pericardiocentesis and risk of hemorrhagic transformation. I do not think she requires long-term anticoagulation unless she develops recurrent atrial fibrillation without recurrent pericardial effusion.  -Check TSH.  3 end-stage renal disease-dialysis dependent. - Nephrology following.  4 shock-resolved following pericardiocentesis.  5 question cholecystitis-possible gallbladder wall thickening on CT scan. No gall bladder wall thickening or signs of cholecystits on [place].. Critical care medicine following. Continue antibiotics.  6 hypertension-BP remains elevated.  Restart home meds.  7 History of nonobstructive coronary disease by cardiac catheterization August 2017.  Signed, Armanda Magic, MD  07/28/2017, 8:06  AM

## 2017-07-29 ENCOUNTER — Encounter (HOSPITAL_COMMUNITY): Payer: Self-pay | Admitting: Interventional Cardiology

## 2017-07-29 LAB — GLUCOSE, CAPILLARY
GLUCOSE-CAPILLARY: 317 mg/dL — AB (ref 65–99)
GLUCOSE-CAPILLARY: 287 mg/dL — AB (ref 65–99)
Glucose-Capillary: 79 mg/dL (ref 65–99)
Glucose-Capillary: 111 mg/dL — ABNORMAL HIGH (ref 65–99)
Glucose-Capillary: 97 mg/dL (ref 65–99)
Glucose-Capillary: 128 mg/dL — ABNORMAL HIGH (ref 65–99)

## 2017-07-29 MED ORDER — ISOSORBIDE MONONITRATE ER 30 MG PO TB24
15.0000 mg | ORAL_TABLET | Freq: Every day | ORAL | Status: DC
  Administered 2017-07-29 – 2017-07-30 (×2): 15 mg via ORAL
  Filled 2017-07-29 (×2): qty 1

## 2017-07-29 MED ORDER — AMLODIPINE BESYLATE 10 MG PO TABS
10.0000 mg | ORAL_TABLET | Freq: Every day | ORAL | Status: DC
  Administered 2017-07-29 – 2017-08-01 (×4): 10 mg via ORAL
  Filled 2017-07-29 (×4): qty 1

## 2017-07-29 MED ORDER — CLONIDINE HCL 0.2 MG PO TABS
0.2000 mg | ORAL_TABLET | Freq: Three times a day (TID) | ORAL | Status: DC
  Administered 2017-07-29 – 2017-08-01 (×9): 0.2 mg via ORAL
  Filled 2017-07-29 (×9): qty 1

## 2017-07-29 MED ORDER — HYDRALAZINE HCL 50 MG PO TABS
50.0000 mg | ORAL_TABLET | Freq: Three times a day (TID) | ORAL | Status: DC
  Administered 2017-07-29 – 2017-08-01 (×9): 50 mg via ORAL
  Filled 2017-07-29 (×9): qty 1

## 2017-07-29 NOTE — Progress Notes (Signed)
Inpatient Diabetes Program Recommendations  AACE/ADA: New Consensus Statement on Inpatient Glycemic Control (2015)  Target Ranges:  Prepandial:   less than 140 mg/dL      Peak postprandial:   less than 180 mg/dL (1-2 hours)      Critically ill patients:  140 - 180 mg/dL   Lab Results  Component Value Date   GLUCAP 287 (H) 07/29/2017   HGBA1C 5.6 12/29/2016    Review of Glycemic ControlResults for Rennie PlowmanRHODES, Colleen (MRN [id number]) as of 07/29/2017 13:59  Ref. Range 07/28/2017 16:05 07/28/2017 21:19 07/29/2017 07:48 07/29/2017 11:23  Glucose-Capillary Latest Ref Range: 65 - 99 mg/dL 782210 (H) 956102 (H) 213111 (H) 287 (H)   Diabetes history: Type 2 diabetes Outpatient Diabetes medications: Novolog 0-15 units tid Current orders for Inpatient glycemic control:  Lantus 15 units daily, Novolog moderate tid with meals and HS  Inpatient Diabetes Program Recommendations:   May consider adding Novolog meal coverage 2 units tid with meals (hold if patient eats less than 50%).  Thanks, Beryl MeagerJenny Jaaziel Peatross, RN, BC-ADM Inpatient Diabetes Coordinator Pager [phone] (8a-5p)

## 2017-07-29 NOTE — Evaluation (Signed)
Occupational Therapy Evaluation Patient Details Name: Colleen Mullins MRN: [id number] DOB: 22-Jun-1946 Today's Date: 07/29/2017    History of Present Illness Colleen Mullins is an 71 y.o. female with end stage renal disease, type 2 diabetes mellitus, coronary artery disease, hypertension presented with right-sided chest pain. She was found to have large pericardial effusion as well as new onset atrial fibrillation. Cardiology performed an echo showing RV collapse and tamponade and she underwent a pericardiocentesis which she had 500 ml fluid and tube placed.  Code stroke called while pt hospitalized but CT neg and R sided weakness attributed to pt's h/o Bell's palsy    Clinical Impression    Pt admitted with above. She demonstrates the below listed deficits and will benefit from continued OT to maximize safety and independence with BADLs.  Pt presents to OT with generalized weakness, decreased activity tolerance, impaired balance.  She was mod I with ADLs PTA, but reports h/o 3 falls in last 3 mos.  She lives with daughter who also receives HD 3x/wk.  Currently, pt requires min A for ADLs and min A for functional mobility (second person for safety) and is at high risk for falls.  Feel pt would benefit from SNF level rehab to maximize her safety and independence with ADLs and functional mobility, reduce risk of falls, and readmission.  Will follow acutely.      Follow Up Recommendations  SNF;Supervision/Assistance - 24 hour    Equipment Recommendations  Tub/shower bench    Recommendations for Other Services       Precautions / Restrictions Precautions Precautions: Fall      Mobility Bed Mobility Overal bed mobility: Needs Assistance Bed Mobility: Supine to Sit     Supine to sit: Supervision        Transfers Overall transfer level: Needs assistance Equipment used: Rolling walker (2 wheeled) Transfers: Sit to/from [employer] Sit to Stand: Min guard Stand pivot  transfers: Min guard       General transfer comment: verbal cues for hand placement     Balance Overall balance assessment: Needs assistance Sitting-balance support: No upper extremity supported Sitting balance-Leahy Scale: Good     Standing balance support: Single extremity supported;During functional activity Standing balance-Leahy Scale: Poor Standing balance comment: requires UE support                           ADL either performed or assessed with clinical judgement   ADL Overall ADL's : Needs assistance/impaired Eating/Feeding: Independent   Grooming: Wash/dry hands;Wash/dry face;Oral care;Brushing hair;Min guard;Standing   Upper Body Bathing: Supervision/ safety;Sitting   Lower Body Bathing: Minimal assistance;Sit to/from stand   Upper Body Dressing : Set up;Sitting   Lower Body Dressing: Minimal assistance;Sit to/from stand Lower Body Dressing Details (indicate cue type and reason): pt with LOB when donnong socks Toilet Transfer: Minimal assistance;Ambulation;RW;BSC;Grab bars   Toileting- Clothing Manipulation and Hygiene: Minimal assistance;Sit to/from stand       Functional mobility during ADLs: Minimal assistance;Min guard;Rolling walker General ADL Comments: pt fatigues quicly with activity     Vision         Perception     Praxis      Pertinent Vitals/Pain Pain Assessment: Faces Faces Pain Scale: Hurts little more Pain Location: shoulders with movement  Pain Descriptors / Indicators: Grimacing Pain Intervention(s): Limited activity within patient's tolerance     Hand Dominance Right   Extremity/Trunk Assessment Upper Extremity Assessment Upper Extremity Assessment: RUE deficits/detail;LUE  deficits/detail RUE Deficits / Details: Pain greatest in R from arthritis. Unable to assess R shoulder AROM due to pain.  LUE Deficits / Details: Significant shoulder pain and limited to ~0-30 degrees passively. 10/10 pain constantly.     Lower Extremity Assessment Lower Extremity Assessment: Defer to PT evaluation       Communication Communication Communication: No difficulties   Cognition Arousal/Alertness: Awake/alert Behavior During Therapy: WFL for tasks assessed/performed Overall Cognitive Status: Within Functional Limits for tasks assessed                                     General Comments  MAP 111 at end of session - RN notified.  Discussed recommendation for SNF level rehab with pt, and she agrees.  She reports she was at [facility] prior after last admission, but her insurance co. felt she was doing well, and discharged her before she felt she was ready      Exercises     Shoulder Instructions      Home Living Family/patient expects to be discharged to:: Private residence Living Arrangements: Children;Other relatives (daughter is also dialysis pt; granddaughter stays with pt) Available Help at Discharge: Family;Available PRN/intermittently Type of Home: Apartment Home Access: Stairs to enter Entrance Stairs-Number of Steps: 5 Entrance Stairs-Rails: Left Home Layout: One level     Bathroom Shower/Tub: Tub/shower unit (stands)   FirefighterBathroom Toilet: Standard     Home Equipment: Environmental consultantWalker - 4 wheels;Cane - single point;Wheelchair - power   Additional Comments: Has had 2 falls in the last 2 months (reports turning wrong once and missing a step the other time)      Prior Functioning/Environment Level of Independence: Independent with assistive device(s)  Gait / Transfers Assistance Needed: Pt states she ambulates with SPC, RW, or uses power w/c depending on her day and how she is feeling  ADL's / Homemaking Assistance Needed: Had personal care assistant for IADL prior to previous hospitalization and was planning to have this start again but had to return to hospital. Family provides supervision   Comments: Uses motorized w/c for days when arthritis is bad. Does not drive (due to  cataracts). Daughter also is a dialysis pt on opposite days from pt.  Pt reports 3 falls in last 3 mos with one resulting in hospitalization.           OT Problem List: Decreased strength;Decreased activity tolerance;Impaired balance (sitting and/or standing);Decreased safety awareness;Decreased knowledge of use of DME or AE;Cardiopulmonary status limiting activity;Impaired UE functional use;Decreased range of motion      OT Treatment/Interventions: Self-care/ADL training;Therapeutic exercise;DME and/or AE instruction;Energy conservation;Therapeutic activities;Patient/family education;Balance training    OT Goals(Current goals can be found in the care plan section) Acute Rehab OT Goals Patient Stated Goal: to get stronger OT Goal Formulation: With patient Time For Goal Achievement: 08/12/17 Potential to Achieve Goals: Good ADL Goals Pt Will Perform Grooming: standing;with supervision Pt Will Perform Lower Body Bathing: with min guard assist;sit to/from stand Pt Will Perform Lower Body Dressing: with min guard assist;sit to/from stand Pt Will Transfer to Toilet: with min guard assist;ambulating;regular height toilet;grab bars;bedside commode Pt Will Perform Toileting - Clothing Manipulation and hygiene: with min guard assist;sit to/from stand  OT Frequency: Min 2X/week   Barriers to D/C:            Co-evaluation PT/OT/SLP Co-Evaluation/Treatment: Yes Reason for Co-Treatment: For patient/therapist safety;To address functional/ADL transfers  OT goals addressed during session: ADL's and self-care      AM-PAC PT "6 Clicks" Daily Activity     Outcome Measure Help from another person eating meals?: None Help from another person taking care of personal grooming?: A Little Help from another person toileting, which includes using toliet, bedpan, or urinal?: A Little Help from another person bathing (including washing, rinsing, drying)?: A Little Help from another person to put on and  taking off regular upper body clothing?: A Little Help from another person to put on and taking off regular lower body clothing?: A Little 6 Click Score: 19   End of Session Equipment Utilized During Treatment: Rolling walker;Gait belt Nurse Communication: Mobility status  Activity Tolerance: Patient limited by fatigue Patient left: in chair;with call bell/phone within reach;with nursing/sitter in room  OT Visit Diagnosis: Unsteadiness on feet (R26.81)                Time: 6962-9528 OT Time Calculation (min): 39 min Charges:    G-Codes:     Jeani Hawking, OTR/L [phone]   Jeani Hawking M 07/29/2017, 1:27 PM

## 2017-07-29 NOTE — Progress Notes (Signed)
Progress Note  Patient Name: Colleen Mullins Date of Encounter: 07/29/2017  Primary Cardiologist: Mayford Knife  Subjective   No new complaints. Mild discomfort at prior cardiac drain site. No SOB.   Inpatient Medications    Scheduled Meds: . calcium acetate  1,334 mg Oral TID WC  . cinacalcet  60 mg Oral Once per day on Mon Wed Fri  . famotidine  20 mg Oral QHS  . gabapentin  100 mg Oral TID  . heparin  5,000 Units Subcutaneous Q8H  . insulin aspart  0-15 Units Subcutaneous TID WC  . insulin aspart  0-5 Units Subcutaneous QHS  . insulin glargine  15 Units Subcutaneous Daily  . latanoprost  1 drop Both Eyes QHS  . mouth rinse  15 mL Mouth Rinse BID   Continuous Infusions: . amiodarone 30 mg/hr (07/29/17 0129)   PRN Meds: acetaminophen, calcium acetate, cyclobenzaprine, diazepam, HYDROcodone-acetaminophen, ondansetron (ZOFRAN) IV   Vital Signs    Vitals:   07/29/17 0400 07/29/17 0500 07/29/17 0749 07/29/17 0800  BP: (!) 149/63 (!) 163/66  (!) 166/58  Pulse:  64  66  Resp:  20  18  Temp: 98.7 F (37.1 C)  98.4 F (36.9 C)   TempSrc: Oral  Oral   SpO2:  99%  98%  Weight:  162 lb 4.1 oz (73.6 kg)    Height:        Intake/Output Summary (Last 24 hours) at 07/29/17 0858 Last data filed at 07/29/17 0800  Gross per 24 hour  Intake            666.8 ml  Output             2525 ml  Net          -1858.2 ml   Filed Weights   07/28/17 0658 07/28/17 1050 07/29/17 0500  Weight: 166 lb 3.6 oz (75.4 kg) 160 lb 11.5 oz (72.9 kg) 162 lb 4.1 oz (73.6 kg)    Telemetry    AFIB>>to NSR about an hour after starting IV amio - Personally Reviewed  ECG    NSR - Personally Reviewed  Physical Exam   GEN: No acute distress.   Neck: No JVD Cardiac: RRR, no murmurs, rubs, or gallops.  Respiratory: Clear to auscultation bilaterally. GI: Soft, nontender, non-distended  MS: No edema; No deformity. Neuro:  Nonfocal  Psych: Normal affect   Labs    Chemistry Recent Labs Lab  07/26/17 0818 07/26/17 1411 07/26/17 2112 07/27/17 0339 07/28/17 0210  NA 132*  --  136 135 134*  K 4.5  --  3.9 3.5 4.2  CL 90*  --  93* 93* 94*  CO2 20*  --   --  25 24  GLUCOSE 520*  --  78 228* 166*  BUN 35*  --  42* 50* 67*  CREATININE 5.09* 5.16* 5.50* 5.93* 7.48*  CALCIUM 8.6*  --   --  7.8* 8.4*  PROT 6.2*  --   --   --  6.0*  ALBUMIN 2.9*  --   --   --  2.7*  AST 42*  --   --   --  22  ALT 25  --   --   --  27  ALKPHOS 132*  --   --   --  135*  BILITOT 0.6  --   --   --  0.6  GFRNONAA 8* 8*  --  6* 5*  GFRAA 9* 9*  --  7* 6*  ANIONGAP 22*  --   --  17* 16*     Hematology Recent Labs Lab 07/26/17 1411 07/26/17 2112 07/27/17 0339 07/28/17 0210  WBC 12.4*  --  10.6* 10.2  RBC 3.53*  --  3.07* 3.38*  HGB 9.7* 10.2* 8.2* 9.2*  HCT 30.5* 30.0* 26.1* 29.1*  MCV 86.4  --  85.0 86.1  MCH 27.5  --  26.7 27.2  MCHC 31.8  --  31.4 31.6  RDW 17.4*  --  16.9* 17.0*  PLT 497*  --  499* 535*    Cardiac EnzymesNo results for input(s): TROPONINI in the last 168 hours. No results for input(s): TROPIPOC in the last 168 hours.   BNPNo results for input(s): BNP, PROBNP in the last 168 hours.   DDimer No results for input(s): DDIMER in the last 168 hours.   Radiology    [place] Abdomen Limited Ruq  Result Date: 07/27/2017 CLINICAL DATA:  71 year old who presented yesterday with mid abdominal pain, nausea and vomiting. CT yesterday questioned gallbladder wall thickening. Ultrasound requested to evaluate for possible cholecystitis EXAM: ULTRASOUND ABDOMEN LIMITED RIGHT UPPER QUADRANT COMPARISON:  CT abdomen and pelvis yesterday.  No prior ultrasound. FINDINGS: Gallbladder: No evidence of gallbladder wall thickening as questioned on yesterday's CT. No shadowing gallstones or gallbladder sludge. Minimal free fluid surrounding the gallbladder. Negative sonographic Murphy's sign according to the ultrasound technologist. Common bile duct: Diameter: Approximately 2 mm. Liver: Coarsened  echotexture diffusely without focal hepatic parenchymal abnormality. Portal vein is patent on color Doppler imaging with normal direction of blood flow towards the liver. IMPRESSION: 1. No sonographic evidence of cholecystitis. No evidence of gallbladder wall thickening as questioned on yesterday's CT. 2. Coarsened hepatic echotexture indicating steatosis and/or hepatocellular disease. No focal hepatic parenchymal abnormality. Electronically Signed   By: Hulan Saashomas  Lawrence M.D.   On: 07/27/2017 09:32    Cardiac Studies   ECHO: Effusion resolved. LVH, normal EF  Patient Profile     71 y.o. female ESRD with cardiac tamponade, AFIB  Assessment & Plan    Cardiac tamponade  - post drain, pulled  - stable, excellent BP/HR.   - ECHO personally reviewed  AFIB  - returned in HD  - restarted IV amio  - let's continue today. Tomorrow change to PO  - CHADSVASC 5  - No anticoagulation currently given recent effusion. Trying to suppress afib with AMIO.   ESRD  - HD  Non obstructive CAD  - Cath 8/17  OK with tx to step down.    Signed, Donato SchultzMark Makai Dumond, MD  07/29/2017, 8:58 AM

## 2017-07-29 NOTE — Evaluation (Signed)
Physical Therapy Evaluation Patient Details Name: Colleen Mullins MRN: [id number] DOB: 28-Apr-1946 Today's Date: 07/29/2017   History of Present Illness  Colleen Mullins is an 71 y.o. female with end stage renal disease, type 2 diabetes mellitus, coronary artery disease, hypertension presented with right-sided chest pain. She was found to have large pericardial effusion as well as new onset atrial fibrillation. Cardiology performed an echo showing RV collapse and tamponade and she underwent a pericardiocentesis which she had 500 ml fluid and tube placed.  Code stroke called while pt hospitalized but CT neg and R sided weakness attributed to pt's h/o Bell's palsy   Clinical Impression  Pt admitted with above diagnosis. Pt currently with functional limitations due to the deficits listed below (see PT Problem List). Pt currently requiring min A for mobility and has high fall risk (has already fallen several times at home). Pt will benefit from skilled PT to increase their independence and safety with mobility to allow discharge to the venue listed below.       Follow Up Recommendations SNF    Equipment Recommendations  None recommended by PT    Recommendations for Other Services       Precautions / Restrictions Precautions Precautions: Fall Precaution Comments: has had multiple falls at home (2 in last month) Restrictions Weight Bearing Restrictions: No      Mobility  Bed Mobility Overal bed mobility: Needs Assistance Bed Mobility: Supine to Sit     Supine to sit: Supervision     General bed mobility comments: performed with OT  Transfers Overall transfer level: Needs assistance Equipment used: Rolling walker (2 wheeled) Transfers: Sit to/from Stand Sit to Stand: Min guard Stand pivot transfers: Min guard       General transfer comment: verbal cues for hand placement   Ambulation/Gait Ambulation/Gait assistance: Min guard Ambulation Distance (Feet): 12 Feet Assistive  device: Rolling walker (2 wheeled) Gait Pattern/deviations: Step-through pattern;Shuffle;Decreased stride length Gait velocity: decreased Gait velocity interpretation: <1.8 ft/sec, indicative of risk for recurrent falls General Gait Details: pt's legs begen to shake after 10', needed to sit after 12'. Has rollator at home so can sit when needed  Stairs            Wheelchair Mobility    Modified Rankin (Stroke Patients Only)       Balance Overall balance assessment: Needs assistance Sitting-balance support: No upper extremity supported Sitting balance-Leahy Scale: Good     Standing balance support: Single extremity supported;During functional activity Standing balance-Leahy Scale: Poor Standing balance comment: requires UE support. after standing at sink ~ 5 mins, pt began to have buckling of B knees. Required seated rest before being able to walk                             Pertinent Vitals/Pain Pain Assessment: Faces Faces Pain Scale: Hurts little more Pain Location: shoulders with movement  Pain Descriptors / Indicators: Grimacing Pain Intervention(s): Limited activity within patient's tolerance;Monitored during session    Home Living Family/patient expects to be discharged to:: Private residence Living Arrangements: Children;Other relatives Available Help at Discharge: Family;Available PRN/intermittently Type of Home: Apartment Home Access: Stairs to enter Entrance Stairs-Rails: Left Entrance Stairs-Number of Steps: 5 Home Layout: One level Home Equipment: Walker - 4 wheels;Cane - single point;Wheelchair - power Additional Comments: Pt lives with her daughter who is alos on HD (they go opposite days). She takes transportation to HD. Granddughter helps them both out  Prior Function Level of Independence: Independent with assistive device(s)   Gait / Transfers Assistance Needed: Pt states she ambulates with SPC, RW, or uses power w/c depending on her  day and how she is feeling (has arthritis). Does not drive due to cataracts   ADL's / Homemaking Assistance Needed: Had personal care assistant for IADL prior to previous hospitalization and was planning to have this start again but had to return to hospital. Family provides supervision. Pt stands to shower  Comments: Uses motorized w/c for days when arthritis is bad. Does not drive (due to cataracts). Daughter also is a dialysis pt on opposite days from pt.  Pt reports 3 falls in last 3 mos with one resulting in hospitalization.        Hand Dominance   Dominant Hand: Right    Extremity/Trunk Assessment   Upper Extremity Assessment Upper Extremity Assessment: Defer to OT evaluation RUE Deficits / Details: Pain greatest in R from arthritis. Unable to assess R shoulder AROM due to pain.  LUE Deficits / Details: Significant shoulder pain and limited to ~0-30 degrees passively. 10/10 pain constantly.     Lower Extremity Assessment Lower Extremity Assessment: Generalized weakness    Cervical / Trunk Assessment Cervical / Trunk Assessment: Normal  Communication   Communication: No difficulties  Cognition Arousal/Alertness: Awake/alert Behavior During Therapy: WFL for tasks assessed/performed Overall Cognitive Status: Within Functional Limits for tasks assessed                                        General Comments General comments (skin integrity, edema, etc.): pt agreeable to SNF at d/c due to not being at her baseline from a strength standpoint. MAP 111 after session, RN aware and present    Exercises     Assessment/Plan    PT Assessment Patient needs continued PT services  PT Problem List Decreased strength;Decreased activity tolerance;Decreased balance;Decreased mobility;Decreased knowledge of use of DME;Decreased knowledge of precautions;Impaired tone       PT Treatment Interventions DME instruction;Gait training;Functional mobility training;Therapeutic  activities;Therapeutic exercise;Balance training;Patient/family education    PT Goals (Current goals can be found in the Care Plan section)  Acute Rehab PT Goals Patient Stated Goal: to get stronger PT Goal Formulation: With patient Time For Goal Achievement: 08/12/17 Potential to Achieve Goals: Good    Frequency Min 3X/week   Barriers to discharge Inaccessible home environment;Decreased caregiver support 5 STE home, intermittent supervision    Co-evaluation PT/OT/SLP Co-Evaluation/Treatment: Yes Reason for Co-Treatment: For patient/therapist safety PT goals addressed during session: Mobility/safety with mobility;Balance;Proper use of DME OT goals addressed during session: ADL's and self-care       AM-PAC PT "6 Clicks" Daily Activity  Outcome Measure Difficulty turning over in bed (including adjusting bedclothes, sheets and blankets)?: A Little Difficulty moving from lying on back to sitting on the side of the bed? : A Little Difficulty sitting down on and standing up from a chair with arms (e.g., wheelchair, bedside commode, etc,.)?: A Little Help needed moving to and from a bed to chair (including a wheelchair)?: A Little Help needed walking in hospital room?: A Little Help needed climbing 3-5 steps with a railing? : A Lot 6 Click Score: 17    End of Session Equipment Utilized During Treatment: Gait belt Activity Tolerance: Patient tolerated treatment well Patient left: in chair;with call bell/phone within reach;with nursing/sitter in room Nurse Communication: Mobility  status PT Visit Diagnosis: Unsteadiness on feet (R26.81);Repeated falls (R29.6);Muscle weakness (generalized) (M62.81)    Time: [id number] PT Time Calculation (min) (ACUTE ONLY): 18 min   Charges:   PT Evaluation $PT Eval Moderate Complexity: 1 Mod     PT G Codes:        Colleen Mullins, PT  Acute Rehab Services  [phone]   Colleen Mullins 07/29/2017, 2:17 PM

## 2017-07-29 NOTE — Care Management Note (Addendum)
Case Management Note  Patient Details  Name: Colleen Mullins MRN: [id number] Date of Birth: 08/29/1946  Subjective/Objective:   From home with daughter, [name].  Presents with Cardiac tamponade s/p urgent pericardiocentesis, new onset afib, has hx of ESRD and CVA with right facial droop, she has a motorized w/chair , cane and rolling walker at home.  Conts on iv amio, she is a HD patient M, W, F.  [facility], OT, and aide if possible.  Await pt/ot eval.  Cards following for afib, no anticoagulation yet.    9/5 1005 Letha Capeeborah Sha Amer RN, BSN - Per pt/ot eval rec SNF, CSW referral.               Action/Plan: NCM will follow for dc needs.   Expected Discharge Date:                  Expected Discharge Plan:  Home w Home Health Services  In-House Referral:     Discharge planning Services  CM Consult  Post Acute Care Choice:  Resumption of Svcs/PTA Provider Choice offered to:  Patient  DME Arranged:    DME Agency:     HH Arranged:  RN, PT, OT, Nurse's Aide HH Agency:  [facility]  Status of Service:  In process, will continue to follow  If discussed at Long Length of Stay Meetings, dates discussed:    Additional Comments:  Leone Havenaylor, Aireal Slater Clinton, RN 07/29/2017, 12:11 PM

## 2017-07-29 NOTE — Progress Notes (Signed)
[facility] TEAM 1 - Stepdown/ICU TEAM  Colleen Mullins  ZOX:[id number] DOB: [birth year] DOA: 07/26/2017 PCP: Zoila Shutter, MD    Brief Narrative:  71 y.o. female with a history of DM2, vascular disease, HTN, HLD, ESRD on HD, and CAD (last cath August 2017 showed a 60% first diagonal and a 50% posterior lateral) who was admitted with a pericardial effusion and tamponade. Patient went emergently to the cardiac catheterization lab for pericardiocentesis. Patient also noted to be in new onset atrial fibrillation. Echocardiogram prior to pericardiocentesis showed normal LV systolic function, severe left ventricular hypertrophy, and the large pericardial effusion with tamponade physiology.  Significant Events: 9/1 admit - TTE - emergent pericardiocentesis  9/2 TRH assumed care  9/3 pericardial drain removed   Subjective: Resting comfortably in bed.  No focal neurologic complaints.  Reports she feels back to normal.  Denies cp, sob, n/v, or abdom pain.     Assessment & Plan:  Pericardial effusion/cardiac tamponade Status post emergent pericardiocentesis per Cardiology 9/1 - pericardial drain removed 9/3 - care per Cardiology - clinically stable   Cardiogenic shock Due to tamponade - resolved post pericardiocentesis  New onset atrial fibrillation Spontaneously converted to sinus rhythm after pericardiocentesis, but back in Afib after HD 9/3 - amiodarone per Cardiology - Chadsvasc is 5 - no anticoagulation presently due to risk for hemorrhagic pericardial effusion  Acute R facial droop / R hemiparesis  Occurred 9 PM 9/1 post pericardiocentesis - MRI negative for acute CVA - MRA head w/o large vessel occlusion - Neurology has evaluated - TIA related to cardiac emboli is a consideration - anticoag for Afib as soon as Cards comfortable w/ same    ESRD Nephrology following - on a M/W/F schedule   DM2 CBG remains quite variable (102-264) - follow trend w/o change today   Possible cholecystitis -  RULED OUT Abdominal ultrasound pending - no classic symptoms to suggest an acute cholecystitis  HTN BP now rebounding - resume usual home meds and follow   Nonobstructive CAD Noted on cardiac cath August 2017 - Cardiology following  Anemia of chronic kidney disease No evidence of acute blood loss - follow hemoglobin  DVT prophylaxis: SQ heparin  Code Status: FULL CODE Family Communication: no family present at time of exam  Disposition Plan: stable for transfer once pericardial drain removed   Consultants:  Cardiology Neurology PCCM  Antimicrobials:  Zosyn 9/1 Vancomycin 9/1  Unasyn 9/1  Objective: Blood pressure (!) 159/54, pulse 72, temperature 98.4 F (36.9 C), temperature source Oral, resp. rate 15, height 5\' 5"  (1.651 m), weight 73.6 kg (162 lb 4.1 oz), SpO2 95 %.  Intake/Output Summary (Last 24 hours) at 07/29/17 1107 Last data filed at 07/29/17 1000  Gross per 24 hour  Intake            923.5 ml  Output               25 ml  Net            898.5 ml   Filed Weights   07/28/17 0658 07/28/17 1050 07/29/17 0500  Weight: 75.4 kg (166 lb 3.6 oz) 72.9 kg (160 lb 11.5 oz) 73.6 kg (162 lb 4.1 oz)    Examination: General: No acute respiratory distress - alert and oriented  Lungs: Clear to auscultation bilaterally  Cardiovascular: Regular rate and rhythm at time of my exam  Abdomen: Nontender, nondistended, soft, bowel sounds positive Extremities: No signif edema bilateral lower extremities  CBC:  Recent  Labs Lab 07/26/17 0818 07/26/17 1411 07/26/17 2112 07/27/17 0339 07/28/17 0210  WBC 11.4* 12.4*  --  10.6* 10.2  NEUTROABS 8.6*  --   --   --   --   HGB 9.3* 9.7* 10.2* 8.2* 9.2*  HCT 30.2* 30.5* 30.0* 26.1* 29.1*  MCV 88.0 86.4  --  85.0 86.1  PLT 533* 497*  --  499* 535*   Basic Metabolic Panel:  Recent Labs Lab 07/26/17 0818 07/26/17 1411 07/26/17 2112 07/27/17 0339 07/28/17 0210  NA 132*  --  136 135 134*  K 4.5  --  3.9 3.5 4.2  CL 90*  --   93* 93* 94*  CO2 20*  --   --  25 24  GLUCOSE 520*  --  78 228* 166*  BUN 35*  --  42* 50* 67*  CREATININE 5.09* 5.16* 5.50* 5.93* 7.48*  CALCIUM 8.6*  --   --  7.8* 8.4*   GFR: Estimated Creatinine Clearance: 6.8 mL/min (A) (by C-G formula based on SCr of 7.48 mg/dL (H)).  Liver Function Tests:  Recent Labs Lab 07/26/17 0818 07/28/17 0210  AST 42* 22  ALT 25 27  ALKPHOS 132* 135*  BILITOT 0.6 0.6  PROT 6.2* 6.0*  ALBUMIN 2.9* 2.7*    Recent Labs Lab 07/26/17 0818  LIPASE 24    HbA1C: Hgb A1c MFr Bld  Date/Time Value Ref Range Status  12/29/2016 09:32 PM 5.6 4.8 - 5.6 % Final    Comment:    (NOTE)         Pre-diabetes: 5.7 - 6.4         Diabetes: >6.4         Glycemic control for adults with diabetes: <7.0   04/15/2016 03:53 AM 6.4 (H) 4.8 - 5.6 % Final    Comment:    (NOTE)         Pre-diabetes: 5.7 - 6.4         Diabetes: >6.4         Glycemic control for adults with diabetes: <7.0     CBG:  Recent Labs Lab 07/27/17 2104 07/28/17 1132 07/28/17 1605 07/28/17 2119 07/29/17 0748  GLUCAP 264* 192* 210* 102* 111*    Recent Results (from the past 240 hour(s))  Culture, blood (Routine X 2) w Reflex to ID Panel     Status: None (Preliminary result)   Collection Time: 07/26/17  8:18 AM  Result Value Ref Range Status   Specimen Description BLOOD LEFT HAND  Final   Special Requests   Final    BOTTLES DRAWN AEROBIC ONLY Blood Culture adequate volume   Culture NO GROWTH 2 DAYS  Final   Report Status PENDING  Incomplete  Culture, blood (Routine X 2) w Reflex to ID Panel     Status: None (Preliminary result)   Collection Time: 07/26/17  9:30 AM  Result Value Ref Range Status   Specimen Description BLOOD LEFT HAND  Final   Special Requests   Final    BOTTLES DRAWN AEROBIC AND ANAEROBIC Blood Culture adequate volume   Culture NO GROWTH 2 DAYS  Final   Report Status PENDING  Incomplete  Culture, body fluid-bottle     Status: None (Preliminary result)    Collection Time: 07/26/17 11:13 AM  Result Value Ref Range Status   Specimen Description FLUID PERICARDIAL  Final   Special Requests NONE  Final   Culture NO GROWTH 2 DAYS  Final   Report Status PENDING  Incomplete  Gram stain     Status: None   Collection Time: 07/26/17 11:13 AM  Result Value Ref Range Status   Specimen Description FLUID PERICARDIAL  Final   Special Requests NONE  Final   Gram Stain   Final    FEW WBC PRESENT, PREDOMINANTLY PMN NO ORGANISMS SEEN    Report Status 07/26/2017 FINAL  Final  MRSA PCR Screening     Status: Abnormal   Collection Time: 07/26/17 12:35 PM  Result Value Ref Range Status   MRSA by PCR INVALID RESULTS, SPECIMEN SENT FOR CULTURE (A) NEGATIVE Final    Comment: RESULT CALLED TO, READ BACK BY AND VERIFIED WITH: S HINTZ,RN AT 1736 07/26/17 BY L BENFIELD        The GeneXpert MRSA Assay (FDA approved for NASAL specimens only), is one component of a comprehensive MRSA colonization surveillance program. It is not intended to diagnose MRSA infection nor to guide or monitor treatment for MRSA infections.   MRSA culture     Status: None   Collection Time: 07/26/17 12:35 PM  Result Value Ref Range Status   Specimen Description NASAL SWAB  Final   Special Requests NONE  Final   Culture NO MRSA DETECTED  Final   Report Status 07/28/2017 FINAL  Final  Culture, blood (routine x 2)     Status: None (Preliminary result)   Collection Time: 07/26/17  2:11 PM  Result Value Ref Range Status   Specimen Description BLOOD LEFT ARM  Final   Special Requests IN PEDIATRIC BOTTLE Blood Culture adequate volume  Final   Culture NO GROWTH 2 DAYS  Final   Report Status PENDING  Incomplete  Culture, blood (routine x 2)     Status: None (Preliminary result)   Collection Time: 07/26/17  2:11 PM  Result Value Ref Range Status   Specimen Description BLOOD LEFT HAND  Final   Special Requests IN PEDIATRIC BOTTLE Blood Culture adequate volume  Final   Culture NO GROWTH  2 DAYS  Final   Report Status PENDING  Incomplete     Scheduled Meds: . calcium acetate  1,334 mg Oral TID WC  . cinacalcet  60 mg Oral Once per day on Mon Wed Fri  . famotidine  20 mg Oral QHS  . gabapentin  100 mg Oral TID  . heparin  5,000 Units Subcutaneous Q8H  . insulin aspart  0-15 Units Subcutaneous TID WC  . insulin aspart  0-5 Units Subcutaneous QHS  . insulin glargine  15 Units Subcutaneous Daily  . latanoprost  1 drop Both Eyes QHS  . mouth rinse  15 mL Mouth Rinse BID     LOS: 3 days   Lonia Blood, MD [facility] Office  [phone] Pager - Text Page per Amion as per below:  On-Call/Text Page:      [email or web address]      password [email or web address]  If 7PM-7AM, please contact night-coverage [email or web address] Password [email or web address] 07/29/2017, 11:07 AM

## 2017-07-29 NOTE — Progress Notes (Addendum)
[facility] Progress Note  Subjective: no c/o, drain is out. Minimal CP, no SOB. No fevers.  Cytology pending on fluid.   Vitals:   07/29/17 0900 07/29/17 1000 07/29/17 1100 07/29/17 1124  BP: (!) 169/66 (!) 159/54 (!) 167/64   Pulse:  72 68   Resp: (!) 23 15 20    Temp:    98.7 F (37.1 C)  TempSrc:    Oral  SpO2:  95% 95%   Weight:      Height:        Inpatient medications: . calcium acetate  1,334 mg Oral TID WC  . cinacalcet  60 mg Oral Once per day on Mon Wed Fri  . famotidine  20 mg Oral QHS  . gabapentin  100 mg Oral TID  . heparin  5,000 Units Subcutaneous Q8H  . hydrALAZINE  50 mg Oral TID  . insulin aspart  0-15 Units Subcutaneous TID WC  . insulin aspart  0-5 Units Subcutaneous QHS  . insulin glargine  15 Units Subcutaneous Daily  . isosorbide mononitrate  15 mg Oral Q1200  . latanoprost  1 drop Both Eyes QHS  . mouth rinse  15 mL Mouth Rinse BID   . amiodarone 30 mg/hr (07/29/17 0129)   acetaminophen, calcium acetate, cyclobenzaprine, diazepam, HYDROcodone-acetaminophen, ondansetron (ZOFRAN) IV  Exam: Alert, no distress No jvd Chest clear on L, dec'd R base RRR no mrg ABd soft ntnd no ascites, pericardial drain in subxiphoid space Ext no LE edema or wounds Ox 3, alert Thigh aVG +bruit  Dialysis:  MWF SW   4h  71kg  L thigh AVG  Hep 2500 (No heparin here d/t bloody pericard effusion) - Mircera IV q 2 weeks (last given on 8/22) - Hectoral IV q HD -home BP > norvasc 10/ clonidine 0.2 tid/ hydral 50 tid/ labetalol 300 bid/ losartan 100 hs   Assessment:   1. Pericardial effusion w/ tamponade - s/p urgent pericardiocentesis. Etiology unclear, possilbe uremic pericarditis. Cytology pending. Will send ANA. Have d/w cardiology; not having signs of active pericarditis.  Will hold heparin w HD for 2 wks.  2. ESRD: HD MWF.  HD today.  No hep x 2 wks through 9/18.  3. HTN: was on five (5) BP meds at home. Will resume some of  her BP meds.  4. Anemia: Hgb 9.3. Due for ESA , will give darbe 75 on Wed.  Follow. 5. Metabolic bone disease: Ca ok. Monitor. Will resume VDRA with next HD. 6. Type 2 DM: Per primary. 7. New onset atrial fibrillation: On amiodarone; no anticoag due to #1.  Per primary and cardiology. 8. Hx CVA w/ R hemi/ facial droop   Plan - HD Wed, resume some BP meds   Vinson Moselle MD [facility] pager [phone]   07/29/2017, 11:53 AM    Recent Labs Lab 07/26/17 0818  07/26/17 2112 07/27/17 0339 07/28/17 0210  NA 132*  --  136 135 134*  K 4.5  --  3.9 3.5 4.2  CL 90*  --  93* 93* 94*  CO2 20*  --   --  25 24  GLUCOSE 520*  --  78 228* 166*  BUN 35*  --  42* 50* 67*  CREATININE 5.09*  < > 5.50* 5.93* 7.48*  CALCIUM 8.6*  --   --  7.8* 8.4*  < > = values in this interval not displayed.  Recent Labs Lab 07/26/17 0818 07/28/17 0210  AST 42*  22  ALT 25 27  ALKPHOS 132* 135*  BILITOT 0.6 0.6  PROT 6.2* 6.0*  ALBUMIN 2.9* 2.7*    Recent Labs Lab 07/26/17 0818 07/26/17 1411 07/26/17 2112 07/27/17 0339 07/28/17 0210  WBC 11.4* 12.4*  --  10.6* 10.2  NEUTROABS 8.6*  --   --   --   --   HGB 9.3* 9.7* 10.2* 8.2* 9.2*  HCT 30.2* 30.5* 30.0* 26.1* 29.1*  MCV 88.0 86.4  --  85.0 86.1  PLT 533* 497*  --  499* 535*   Iron/TIBC/Ferritin/ %Sat    Component Value Date/Time   IRON 53 08/17/2010 0330   TIBC 359 08/17/2010 0330   IRONPCTSAT 15 (L) 08/17/2010 0330

## 2017-07-30 LAB — CBC
HCT: 29.5 % — ABNORMAL LOW (ref 36.0–46.0)
Hemoglobin: 9.5 g/dL — ABNORMAL LOW (ref 12.0–15.0)
MCH: 27.1 pg (ref 26.0–34.0)
MCHC: 32.2 g/dL (ref 30.0–36.0)
MCV: 84 fL (ref 78.0–100.0)
PLATELETS: 476 10*3/uL — AB (ref 150–400)
RBC: 3.51 MIL/uL — ABNORMAL LOW (ref 3.87–5.11)
RDW: 16.3 % — AB (ref 11.5–15.5)
WBC: 7.7 10*3/uL (ref 4.0–10.5)

## 2017-07-30 LAB — RENAL FUNCTION PANEL
ANION GAP: 15 (ref 5–15)
Albumin: 2.4 g/dL — ABNORMAL LOW (ref 3.5–5.0)
Albumin: 2.9 g/dL — ABNORMAL LOW (ref 3.5–5.0)
Anion gap: 14 (ref 5–15)
BUN: 38 mg/dL — AB (ref 6–20)
BUN: 13 mg/dL (ref 6–20)
CALCIUM: 8.1 mg/dL — AB (ref 8.9–10.3)
CHLORIDE: 88 mmol/L — AB (ref 101–111)
CHLORIDE: 90 mmol/L — AB (ref 101–111)
CO2: 26 mmol/L (ref 22–32)
CO2: 28 mmol/L (ref 22–32)
CREATININE: 5.35 mg/dL — AB (ref 0.44–1.00)
Calcium: 8.8 mg/dL — ABNORMAL LOW (ref 8.9–10.3)
Creatinine, Ser: 3.02 mg/dL — ABNORMAL HIGH (ref 0.44–1.00)
GFR calc Af Amer: 8 mL/min — ABNORMAL LOW (ref >60)
GFR calc Af Amer: 17 mL/min — ABNORMAL LOW (ref >60)
GFR calc non Af Amer: 7 mL/min — ABNORMAL LOW (ref >60)
GFR calc non Af Amer: 14 mL/min — ABNORMAL LOW (ref >60)
GLUCOSE: 177 mg/dL — AB (ref 65–99)
Glucose, Bld: 113 mg/dL — ABNORMAL HIGH (ref 65–99)
POTASSIUM: 3.9 mmol/L (ref 3.5–5.1)
Phosphorus: 3.7 mg/dL (ref 2.5–4.6)
Phosphorus: 3 mg/dL (ref 2.5–4.6)
Potassium: 4.3 mmol/L (ref 3.5–5.1)
SODIUM: 128 mmol/L — AB (ref 135–145)
Sodium: 133 mmol/L — ABNORMAL LOW (ref 135–145)

## 2017-07-30 LAB — GLUCOSE, CAPILLARY
GLUCOSE-CAPILLARY: 254 mg/dL — AB (ref 65–99)
GLUCOSE-CAPILLARY: 98 mg/dL (ref 65–99)
Glucose-Capillary: 132 mg/dL — ABNORMAL HIGH (ref 65–99)

## 2017-07-30 MED ORDER — LIDOCAINE HCL (PF) 1 % IJ SOLN
5.0000 mL | INTRAMUSCULAR | Status: DC | PRN
Start: 2017-07-30 — End: 2017-07-30

## 2017-07-30 MED ORDER — ISOSORBIDE MONONITRATE ER 30 MG PO TB24
30.0000 mg | ORAL_TABLET | Freq: Every day | ORAL | Status: DC
  Administered 2017-07-31 – 2017-08-01 (×2): 30 mg via ORAL
  Filled 2017-07-30 (×2): qty 1

## 2017-07-30 MED ORDER — SODIUM CHLORIDE 0.9 % IV SOLN
100.0000 mL | INTRAVENOUS | Status: DC | PRN
Start: 2017-07-30 — End: 2017-07-30

## 2017-07-30 MED ORDER — AMIODARONE HCL 200 MG PO TABS
400.0000 mg | ORAL_TABLET | Freq: Two times a day (BID) | ORAL | Status: DC
  Administered 2017-07-30 – 2017-08-01 (×5): 400 mg via ORAL
  Filled 2017-07-30 (×5): qty 2

## 2017-07-30 MED ORDER — ALTEPLASE 2 MG IJ SOLR: 2.0000 mg | Freq: Once | INTRAMUSCULAR | Status: DC | PRN

## 2017-07-30 MED ORDER — DARBEPOETIN ALFA 60 MCG/0.3ML IJ SOSY
60.0000 ug | PREFILLED_SYRINGE | INTRAMUSCULAR | Status: DC
  Administered 2017-07-30: 60 ug via INTRAVENOUS

## 2017-07-30 MED ORDER — HEPARIN SODIUM (PORCINE) 1000 UNIT/ML DIALYSIS: 1000.0000 [IU] | INTRAMUSCULAR | Status: DC | PRN

## 2017-07-30 MED ORDER — PENTAFLUOROPROP-TETRAFLUOROETH EX AERO: 1.0000 "application " | INHALATION_SPRAY | CUTANEOUS | Status: DC | PRN

## 2017-07-30 MED ORDER — LIDOCAINE-PRILOCAINE 2.5-2.5 % EX CREA
1.0000 "application " | TOPICAL_CREAM | CUTANEOUS | Status: DC | PRN
  Filled 2017-07-30: qty 5

## 2017-07-30 MED ORDER — SODIUM CHLORIDE 0.9 % IV SOLN: 100.0000 mL | INTRAVENOUS | Status: DC | PRN

## 2017-07-30 MED ORDER — DARBEPOETIN ALFA 100 MCG/0.5ML IJ SOSY: 100.0000 ug | PREFILLED_SYRINGE | INTRAMUSCULAR | Status: DC

## 2017-07-30 NOTE — Progress Notes (Signed)
  Amiodarone Drug - Drug Interaction Consult Note  Recommendations:  No interactions noted at this time. Pharmacy will continue to monitor.   Amiodarone is metabolized by the cytochrome P450 system and therefore has the potential to cause many drug interactions. Amiodarone has an average plasma half-life of 50 days (range 20 to 100 days).   There is potential for drug interactions to occur several weeks or months after stopping treatment and the onset of drug interactions may be slow after initiating amiodarone.   []  Statins: Increased risk of myopathy. Simvastatin- restrict dose to 20mg  daily. Other statins: counsel patients to report any muscle pain or weakness immediately.  []  Anticoagulants: Amiodarone can increase anticoagulant effect. Consider warfarin dose reduction. Patients should be monitored closely and the dose of anticoagulant altered accordingly, remembering that amiodarone levels take several weeks to stabilize.  []  Antiepileptics: Amiodarone can increase plasma concentration of phenytoin, the dose should be reduced. Note that small changes in phenytoin dose can result in large changes in levels. Monitor patient and counsel on signs of toxicity.  []  Beta blockers: increased risk of bradycardia, AV block and myocardial depression. Sotalol - avoid concomitant use.  []   Calcium channel blockers (diltiazem and verapamil): increased risk of bradycardia, AV block and myocardial depression.  []   Cyclosporine: Amiodarone increases levels of cyclosporine. Reduced dose of cyclosporine is recommended.  []  Digoxin dose should be halved when amiodarone is started.  []  Diuretics: increased risk of cardiotoxicity if hypokalemia occurs.  []  Oral hypoglycemic agents (glyburide, glipizide, glimepiride): increased risk of hypoglycemia. Patient's glucose levels should be monitored closely when initiating amiodarone therapy.   []  Drugs that prolong the QT interval:  Torsades de pointes risk  may be increased with concurrent use - avoid if possible.  Monitor QTc, also keep magnesium/potassium WNL if concurrent therapy can't be avoided. Marland Kitchen. Antibiotics: e.g. fluoroquinolones, erythromycin. . Antiarrhythmics: e.g. quinidine, procainamide, disopyramide, sotalol. . Antipsychotics: e.g. phenothiazines, haloperidol.  . Lithium, tricyclic antidepressants, and methadone.  Thank You,  Adline PotterSabrina Josealberto Montalto, PharmD Pharmacy Resident Pager: [phone]  07/30/2017 1:33 PM

## 2017-07-30 NOTE — Progress Notes (Addendum)
[facility] Progress Note  Subjective: no c/o, up 4 kg, on HD.  No SOB or CP.    Vitals:   07/30/17 0930 07/30/17 1000 07/30/17 1030 07/30/17 1100  BP: (!) 156/79 (!) 143/74 (!) 153/73 (!) 168/79  Pulse: 66 71 68 70  Resp:      Temp:      TempSrc:      SpO2:      Weight:      Height:        Inpatient medications: . amLODipine  10 mg Oral Q supper  . calcium acetate  1,334 mg Oral TID WC  . cinacalcet  60 mg Oral Once per day on Mon Wed Fri  . cloNIDine  0.2 mg Oral TID  . famotidine  20 mg Oral QHS  . gabapentin  100 mg Oral TID  . heparin  5,000 Units Subcutaneous Q8H  . hydrALAZINE  50 mg Oral TID  . insulin aspart  0-15 Units Subcutaneous TID WC  . insulin aspart  0-5 Units Subcutaneous QHS  . insulin glargine  15 Units Subcutaneous Daily  . isosorbide mononitrate  15 mg Oral Q1200  . latanoprost  1 drop Both Eyes QHS  . mouth rinse  15 mL Mouth Rinse BID   . sodium chloride    . sodium chloride    . amiodarone 30 mg/hr (07/30/17 0307)   sodium chloride, sodium chloride, acetaminophen, alteplase, calcium acetate, cyclobenzaprine, diazepam, heparin, HYDROcodone-acetaminophen, lidocaine (PF), lidocaine-prilocaine, ondansetron (ZOFRAN) IV, pentafluoroprop-tetrafluoroeth  Exam: Alert, no distress No jvd Chest clear on L, dec'd R base RRR no mrg ABd soft ntnd no ascites, pericardial drain in subxiphoid space Ext no LE edema or wounds Ox 3, alert Thigh aVG +bruit  Dialysis:  MWF SW   4h  71kg  L thigh AVG  Hep 2500 (No heparin here d/t bloody pericard effusion) - Mircera IV q 2 weeks (last given on 8/22) - Hectoral IV q HD -home BP > norvasc 10/ clonidine 0.2 tid/ hydral 50 tid/ labetalol 300 bid/ losartan 100 hs   Assessment:   1. Pericardial effusion w/ tamponade - s/p urgent pericardiocentesis. Etiology unclear, possibly "uremic" pericarditis however does not miss HD. Cytology pending. Will send ANA which will prob be low  yield. Have d/w cardiology; not having signs of active pericarditis.  Will hold heparin w HD for 2 wks due to bloody effusion.  2. ESRD: HD MWF.  HD today.  No hep x 2 wks through 9/18.  3. HTN: was on five (5) BP meds at home, restarting as needed. Per primary.  4. Vol excess: should get to dry wt with HD today.   5. Anemia: Hgb 9.3. Due for ESA , will give darbe 60 today on HD.  Follow. 6. Metabolic bone disease: Ca ok. Monitor. Will resume VDRA with next HD. 7. Type 2 DM: Per primary. 8. New onset atrial fibrillation: per cards pt on amiodarone, hoping will stay in NSR. Anticoag with #1 would be complicated.  Per primary and cardiology. 9. Hx CVA w/ R hemi/ facial droop  10. Dispo - not sure  Plan - HD today, UF 4 L to dry , darbe 60   Vinson Moselle MD [facility] pager [phone]   07/30/2017, 11:11 AM    Recent Labs Lab 07/27/17 0339 07/28/17 0210 07/30/17 0800  NA 135 134* 128*  K 3.5 4.2 4.3  CL 93* 94* 88*  CO2 25 24 26   GLUCOSE  228* 166* 113*  BUN 50* 67* 38*  CREATININE 5.93* 7.48* 5.35*  CALCIUM 7.8* 8.4* 8.1*  PHOS  --   --  3.7    Recent Labs Lab 07/26/17 0818 07/28/17 0210 07/30/17 0800  AST 42* 22  --   ALT 25 27  --   ALKPHOS 132* 135*  --   BILITOT 0.6 0.6  --   PROT 6.2* 6.0*  --   ALBUMIN 2.9* 2.7* 2.4*    Recent Labs Lab 07/26/17 0818  07/27/17 0339 07/28/17 0210 07/30/17 0412  WBC 11.4*  < > 10.6* 10.2 7.7  NEUTROABS 8.6*  --   --   --   --   HGB 9.3*  < > 8.2* 9.2* 9.5*  HCT 30.2*  < > 26.1* 29.1* 29.5*  MCV 88.0  < > 85.0 86.1 84.0  PLT 533*  < > 499* 535* 476*  < > = values in this interval not displayed. Iron/TIBC/Ferritin/ %Sat    Component Value Date/Time   IRON 53 08/17/2010 0330   TIBC 359 08/17/2010 0330   IRONPCTSAT 15 (L) 08/17/2010 0330

## 2017-07-30 NOTE — Progress Notes (Signed)
Progress Note  Patient Name: Colleen Mullins Date of Encounter: 07/30/2017  Primary Cardiologist: Mayford Knife  Subjective   Mild chest discomfort where catheter/pericardial drain was placed , no shortness of breath   Inpatient Medications    Scheduled Meds: . amLODipine  10 mg Oral Q supper  . calcium acetate  1,334 mg Oral TID WC  . cinacalcet  60 mg Oral Once per day on Mon Wed Fri  . cloNIDine  0.2 mg Oral TID  . darbepoetin (ARANESP) injection - DIALYSIS  60 mcg Intravenous Q Wed-HD  . famotidine  20 mg Oral QHS  . gabapentin  100 mg Oral TID  . heparin  5,000 Units Subcutaneous Q8H  . hydrALAZINE  50 mg Oral TID  . insulin aspart  0-15 Units Subcutaneous TID WC  . insulin aspart  0-5 Units Subcutaneous QHS  . insulin glargine  15 Units Subcutaneous Daily  . isosorbide mononitrate  15 mg Oral Q1200  . latanoprost  1 drop Both Eyes QHS  . mouth rinse  15 mL Mouth Rinse BID   Continuous Infusions: . sodium chloride    . sodium chloride    . amiodarone 30 mg/hr (07/30/17 0307)   PRN Meds: sodium chloride, sodium chloride, acetaminophen, alteplase, calcium acetate, cyclobenzaprine, diazepam, heparin, HYDROcodone-acetaminophen, lidocaine (PF), lidocaine-prilocaine, ondansetron (ZOFRAN) IV, pentafluoroprop-tetrafluoroeth   Vital Signs    Vitals:   07/30/17 1030 07/30/17 1100 07/30/17 1130 07/30/17 1203  BP: (!) 153/73 (!) 168/79 (!) 155/71 (!) (P) 155/74  Pulse: 68 70 71 (P) 70  Resp:    (P) 16  Temp:      TempSrc:    (P) Oral  SpO2:    (P) 96%  Weight:      Height:        Intake/Output Summary (Last 24 hours) at 07/30/17 1300 Last data filed at 07/30/17 1203  Gross per 24 hour  Intake            283.7 ml  Output             3500 ml  Net          -3216.3 ml   Filed Weights   07/29/17 0500 07/30/17 0500 07/30/17 0750  Weight: 162 lb 4.1 oz (73.6 kg) 166 lb 14.4 oz (75.7 kg) 165 lb 5.5 oz (75 kg)    Telemetry    AFIB>>to NSR about an hour after starting IV  amioBack on 07/28/17 -Been maintaining NSR Personally Reviewed  ECG    NSR - Personally Reviewed  Physical Exam   GEN: Well nourished, well developed, in no acute distress  HEENT: normal  Neck: no JVD, carotid bruits, or masses Cardiac: RRR; no murmurs, rubs, or gallops,no edema  Respiratory:  clear to auscultation bilaterally, normal work of breathing GI: soft, nontender, nondistended, + BS MS: no deformity or atrophy  Skin: warm and dry, no rash Neuro:  Alert and Oriented x 3, Strength and sensation are intact Psych: euthymic mood, full affect    Labs    Chemistry Recent Labs Lab 07/26/17 0818  07/27/17 0339 07/28/17 0210 07/30/17 0800  NA 132*  < > 135 134* 128*  K 4.5  < > 3.5 4.2 4.3  CL 90*  < > 93* 94* 88*  CO2 20*  --  25 24 26   GLUCOSE 520*  < > 228* 166* 113*  BUN 35*  < > 50* 67* 38*  CREATININE 5.09*  < > 5.93* 7.48* 5.35*  CALCIUM 8.6*  --  7.8* 8.4* 8.1*  PROT 6.2*  --   --  6.0*  --   ALBUMIN 2.9*  --   --  2.7* 2.4*  AST 42*  --   --  22  --   ALT 25  --   --  27  --   ALKPHOS 132*  --   --  135*  --   BILITOT 0.6  --   --  0.6  --   GFRNONAA 8*  < > 6* 5* 7*  GFRAA 9*  < > 7* 6* 8*  ANIONGAP 22*  --  17* 16* 14  < > = values in this interval not displayed.   Hematology  Recent Labs Lab 07/27/17 0339 07/28/17 0210 07/30/17 0412  WBC 10.6* 10.2 7.7  RBC 3.07* 3.38* 3.51*  HGB 8.2* 9.2* 9.5*  HCT 26.1* 29.1* 29.5*  MCV 85.0 86.1 84.0  MCH 26.7 27.2 27.1  MCHC 31.4 31.6 32.2  RDW 16.9* 17.0* 16.3*  PLT 499* 535* 476*    Cardiac EnzymesNo results for input(s): TROPONINI in the last 168 hours. No results for input(s): TROPIPOC in the last 168 hours.   BNPNo results for input(s): BNP, PROBNP in the last 168 hours.   DDimer No results for input(s): DDIMER in the last 168 hours.   Radiology    No results found.  Cardiac Studies   ECHO: Effusion resolved. LVH, normal EF  Patient Profile     71 y.o. female ESRD with cardiac  tamponade, AFIB  Assessment & Plan    Cardiac tamponade  - post drain, pulled  - stable, excellent BP/HR.   - ECHO personally reviewed  - Unsure of etiology. Fluid has been sent for cytology per report. Typically end-stage renal disease patients will have uremic effusions however she has been very compliant with her hemodialysis and this seems unusual. A viral etiology is also a common etiology however she was not having any specific viral illness preceding. No active signs of pericarditis. Cytology has been sent to rule out malignancy which also seems less likely. Regardless, she has improved.  AFIB  - returned in HD 2 days ago  - OK to change amio to PO. 400mg  PO BID for 7 days then 200mg  PO QD thereafter.   - CHADSVASC 5  - No anticoagulation currently given recent effusion and concerns about hemorrhagic transformation. Trying to suppress afib with AMIO. If atrial fibrillation returns of course we will need to weigh the risks versus benefits of full anticoagulation at that time.  ESRD  - HD  Non obstructive CAD  - Cath 8/17  No further recommendations from a cardiac perspective. I think it is reasonable to discharge.   Signed, Donato SchultzMark Irean Kendricks, MD  07/30/2017, 1:00 PM

## 2017-07-30 NOTE — NC FL2 (Signed)
[place] MEDICAID FL2 LEVEL OF CARE SCREENING TOOL     IDENTIFICATION  Patient Name: Colleen Mullins Birthdate: 1946/10/15 Sex: female Admission Date (Current Location): 07/26/2017  [facility] and [place] Number:  Producer, television/film/video and Address:  [facility], [address] [place], [place] [address]      Provider Number: [id number]  Attending Physician Name and Address:  Lonia Blood, MD  Relative Name and Phone Number:       Current Level of Care: Hospital Recommended Level of Care: Skilled Nursing Facility Prior Approval Number:    Date Approved/Denied:   PASRR Number: [id number]  Discharge Plan: SNF    Current Diagnoses: Patient Active Problem List   Diagnosis Date Noted  . Tamponade 07/26/2017  . Pericardial effusion   . Acute on chronic respiratory failure with hypoxia (HCC) 06/14/2017  . Acute on chronic diastolic (congestive) heart failure (HCC) 06/14/2017  . Chest wall pain 06/14/2017  . Aspiration pneumonia (HCC) 06/14/2017  . Right shoulder pain 06/14/2017  . GERD (gastroesophageal reflux disease) 06/14/2017  . PVD (peripheral vascular disease) (HCC) 03/18/2017  . CAD in native artery 08/12/2016  . Abnormal nuclear stress test 07/10/2016  . Musculoskeletal chest pain   . Anemia in chronic renal disease 07/08/2016  . Dyspnea on exertion 04/14/2016  . ESRD on dialysis (HCC) 04/14/2016  . Hypertensive heart disease with congestive heart failure (HCC) 04/14/2016  . Hyperlipidemia 04/14/2016  . 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 Hypoglycemia 10/16/2015  . Elevated troponin 10/16/2015  . Ejection fraction   . Type 2 diabetes mellitus with renal complication (HCC) 09/26/2011  . Other complications due to renal dialysis device, implant, and graft 09/26/2011    Orientation RESPIRATION BLADDER Height & Weight     Self, Time, Situation, Place  Normal Continent Weight: 165 lb 5.5 oz (75 kg) Height:  5\' 5"   (165.1 cm)  BEHAVIORAL SYMPTOMS/MOOD NEUROLOGICAL BOWEL NUTRITION STATUS      Continent Diet (carb modified, renal)  AMBULATORY STATUS COMMUNICATION OF NEEDS Skin   Limited Assist Verbally Normal                       Personal Care Assistance Level of Assistance  Bathing, Dressing Bathing Assistance: Limited assistance   Dressing Assistance: Limited assistance     Functional Limitations Info             SPECIAL CARE FACTORS FREQUENCY  PT (By licensed PT), OT (By licensed OT)     PT Frequency: 5/wk OT Frequency: 5/wk            Contractures      Additional Factors Info  Code Status, Allergies Code Status Info: FULL Allergies Info: Iodinated Diagnostic Agents, Lactose Intolerance (Gi)           Current Medications (07/30/2017):  This is the current hospital active medication list Current Facility-Administered Medications  Medication Dose Route Frequency Provider Last Rate Last Dose  . 0.9 %  sodium chloride infusion  100 mL Intravenous PRN Delano Metz, MD      . 0.9 %  sodium chloride infusion  100 mL Intravenous PRN Delano Metz, MD      . acetaminophen (TYLENOL) tablet 650 mg  650 mg Oral Q4H PRN Lupita Leash, MD   650 mg at 07/28/17 1143  . alteplase (CATHFLO ACTIVASE) injection 2 mg  2 mg Intracatheter Once PRN Delano Metz, MD      . amiodarone (  NEXTERONE PREMIX) 360-4.14 MG/200ML-% (1.8 mg/mL) IV infusion  30 mg/hr Intravenous Continuous Croitoru, Mihai, MD 16.7 mL/hr at 07/30/17 0307 30 mg/hr at 07/30/17 0307  . amLODipine (NORVASC) tablet 10 mg  10 mg Oral Q supper Delano MetzSchertz, Robert, MD   10 mg at 07/29/17 1625  . calcium acetate (PHOSLO) capsule 1,334 mg  1,334 mg Oral TID WC Lonia BloodMcClung, Jeffrey T, MD   1,334 mg at 07/29/17 1625  . calcium acetate (PHOSLO) capsule 667 mg  667 mg Oral PRN Hammons, Gerhard MunchKimberly B, RPH      . cinacalcet (SENSIPAR) tablet 60 mg  60 mg Oral Once per day on Mon Wed Fri Lonia BloodMcClung, Jeffrey T, MD   60 mg at 07/28/17 1145  .  cloNIDine (CATAPRES) tablet 0.2 mg  0.2 mg Oral TID Delano MetzSchertz, Robert, MD   0.2 mg at 07/29/17 2119  . cyclobenzaprine (FLEXERIL) tablet 5 mg  5 mg Oral TID PRN Lonia BloodMcClung, Jeffrey T, MD      . diazepam (VALIUM) tablet 5 mg  5 mg Oral Q6H PRN Lonia BloodMcClung, Jeffrey T, MD      . famotidine (PEPCID) tablet 20 mg  20 mg Oral QHS Lonia BloodMcClung, Jeffrey T, MD   20 mg at 07/29/17 2116  . gabapentin (NEURONTIN) capsule 100 mg  100 mg Oral TID Lonia BloodMcClung, Jeffrey T, MD   100 mg at 07/29/17 2116  . heparin injection 1,000 Units  1,000 Units Dialysis PRN Delano MetzSchertz, Robert, MD      . heparin injection 5,000 Units  5,000 Units Subcutaneous Q8H Max FickleMcQuaid, Douglas B, MD   5,000 Units at 07/30/17 0532  . hydrALAZINE (APRESOLINE) tablet 50 mg  50 mg Oral TID Lonia BloodMcClung, Jeffrey T, MD   50 mg at 07/29/17 2116  . HYDROcodone-acetaminophen (NORCO/VICODIN) 5-325 MG per tablet 1 tablet  1 tablet Oral Q4H PRN Lonia BloodMcClung, Jeffrey T, MD   1 tablet at 07/29/17 2012  . insulin aspart (novoLOG) injection 0-15 Units  0-15 Units Subcutaneous TID WC Lonia BloodMcClung, Jeffrey T, MD   8 Units at 07/29/17 1147  . insulin aspart (novoLOG) injection 0-5 Units  0-5 Units Subcutaneous QHS Lonia BloodMcClung, Jeffrey T, MD   3 Units at 07/27/17 2220  . insulin glargine (LANTUS) injection 15 Units  15 Units Subcutaneous Daily Drema DallasWoods, Curtis J, MD   15 Units at 07/29/17 0914  . isosorbide mononitrate (IMDUR) 24 hr tablet 15 mg  15 mg Oral Q1200 Lonia BloodMcClung, Jeffrey T, MD   15 mg at 07/29/17 1249  . latanoprost (XALATAN) 0.005 % ophthalmic solution 1 drop  1 drop Both Eyes QHS Lonia BloodMcClung, Jeffrey T, MD   1 drop at 07/29/17 2119  . lidocaine (PF) (XYLOCAINE) 1 % injection 5 mL  5 mL Intradermal PRN Delano MetzSchertz, Robert, MD      . lidocaine-prilocaine (EMLA) cream 1 application  1 application Topical PRN Delano MetzSchertz, Robert, MD      . MEDLINE mouth rinse  15 mL Mouth Rinse BID Max FickleMcQuaid, Douglas B, MD   15 mL at 07/29/17 2119  . ondansetron (ZOFRAN) injection 4 mg  4 mg Intravenous Q6H PRN Max FickleMcQuaid, Douglas B,  MD   4 mg at 07/29/17 1321  . pentafluoroprop-tetrafluoroeth (GEBAUERS) aerosol 1 application  1 application Topical PRN Delano MetzSchertz, Robert, MD         Discharge Medications: Please see discharge summary for a list of discharge medications.  Relevant Imaging Results:  Relevant Lab Results:   Additional Information SSN: [id number]; [facility]  Mick Tanguma, Wyn QuakerJenna H,  LCSW

## 2017-07-30 NOTE — Progress Notes (Signed)
Report called to Gabe, RN.

## 2017-07-30 NOTE — Progress Notes (Signed)
[facility] TEAM 1 - Stepdown/ICU TEAM  Colleen Mullins  UJW:[id number] DOB: 1946-10-24 DOA: 07/26/2017 PCP: Zoila Shutter, MD    Brief Narrative:  71 y.o. female with a history of DM2, vascular disease, HTN, HLD, ESRD on HD, and CAD (last cath August 2017 showed a 60% first diagonal and a 50% posterior lateral) who was admitted with a pericardial effusion and tamponade. Patient went emergently to the cardiac catheterization lab for pericardiocentesis. Patient also noted to be in new onset atrial fibrillation. Echocardiogram prior to pericardiocentesis showed normal LV systolic function, severe left ventricular hypertrophy, and the large pericardial effusion with tamponade physiology.  Significant Events: 9/1 admit - TTE - emergent pericardiocentesis  9/2 TRH assumed care  9/3 pericardial drain removed   Subjective: The patient is resting comfortably in bed.  She reports only very mild pain at the previous site of her pericardial drain.  She denies substernal chest pressure, shortness of breath, nausea vomiting, or abdominal pain.  She admits to feeling very weak and unstable on her feet.  She has agreed to a rehabilitation stay in a skilled nursing facility prior to going home.   Assessment & Plan:  Pericardial effusion/cardiac tamponade Status post emergent pericardiocentesis per Cardiology 9/1 - pericardial drain removed 9/3 - care per Cardiology - clinically stable - etiology remains unclear - avoiding anticoagulants until at least 9/18 (to include no hep w/ HD) - ANA pending   Cardiogenic shock Due to tamponade - resolved post pericardiocentesis  New onset atrial fibrillation Spontaneously converted to sinus rhythm after pericardiocentesis, but back in Afib after HD 9/3 - in NSR at this time - amiodarone per Cardiology - Chadsvasc is 5 - no anticoagulation presently due to risk for hemorrhagic pericardial effusion  Acute R facial droop / R hemiparesis  Occurred 9 PM 9/1 post  pericardiocentesis - MRI negative for acute CVA - MRA head w/o large vessel occlusion - Neurology has evaluated - TIA related to cardiac emboli is a consideration but presently not safe for anticoag   ESRD Nephrology following - on a M/W/F schedule   DM2 CBG well controlled overall   Possible cholecystitis - RULED OUT Abdominal ultrasound pending - no classic symptoms to suggest an acute cholecystitis  HTN BP trending upward - adjust med tx and follow    Nonobstructive CAD Noted on cardiac cath August 2017 - Cardiology following  Anemia of chronic kidney disease No evidence of acute blood loss - Hgb stable   DVT prophylaxis: SQ heparin  Code Status: FULL CODE Family Communication: no family present at time of exam  Disposition Plan: ready for SNF placement for a rehab stay, w/ ultimate plan to go home w/ her daughter  Consultants:  Cardiology Neurology PCCM  Antimicrobials:  Zosyn 9/1 Vancomycin 9/1  Unasyn 9/1  Objective: Blood pressure (!) 146/64, pulse 73, temperature 97.9 F (36.6 C), temperature source Oral, resp. rate 16, height 5\' 5"  (1.651 m), weight 72 kg (158 lb 11.7 oz), SpO2 97 %.  Intake/Output Summary (Last 24 hours) at 07/30/17 1440 Last data filed at 07/30/17 1300  Gross per 24 hour  Intake            400.6 ml  Output             3500 ml  Net          -3099.4 ml   Filed Weights   07/30/17 0500 07/30/17 0750 07/30/17 1203  Weight: 75.7 kg (166 lb 14.4 oz) 75 kg (165  lb 5.5 oz) 72 kg (158 lb 11.7 oz)    Examination: General: No acute respiratory distress - A&O  Lungs: CTA B w/o wheezing  Cardiovascular: RRR - no rub or M   Abdomen: Nontender, nondistended, soft, bowel sounds positive Extremities: No C/C/E B LE   CBC:  Recent Labs Lab 07/26/17 0818 07/26/17 1411 07/26/17 2112 07/27/17 0339 07/28/17 0210 07/30/17 0412  WBC 11.4* 12.4*  --  10.6* 10.2 7.7  NEUTROABS 8.6*  --   --   --   --   --   HGB 9.3* 9.7* 10.2* 8.2* 9.2* 9.5*  HCT  30.2* 30.5* 30.0* 26.1* 29.1* 29.5*  MCV 88.0 86.4  --  85.0 86.1 84.0  PLT 533* 497*  --  499* 535* 476*   Basic Metabolic Panel:  Recent Labs Lab 07/26/17 0818 07/26/17 1411 07/26/17 2112 07/27/17 0339 07/28/17 0210 07/30/17 0800  NA 132*  --  136 135 134* 128*  K 4.5  --  3.9 3.5 4.2 4.3  CL 90*  --  93* 93* 94* 88*  CO2 20*  --   --  25 24 26   GLUCOSE 520*  --  78 228* 166* 113*  BUN 35*  --  42* 50* 67* 38*  CREATININE 5.09* 5.16* 5.50* 5.93* 7.48* 5.35*  CALCIUM 8.6*  --   --  7.8* 8.4* 8.1*  PHOS  --   --   --   --   --  3.7   GFR: Estimated Creatinine Clearance: 9.5 mL/min (A) (by C-G formula based on SCr of 5.35 mg/dL (H)).  Liver Function Tests:  Recent Labs Lab 07/26/17 0818 07/28/17 0210 07/30/17 0800  AST 42* 22  --   ALT 25 27  --   ALKPHOS 132* 135*  --   BILITOT 0.6 0.6  --   PROT 6.2* 6.0*  --   ALBUMIN 2.9* 2.7* 2.4*    Recent Labs Lab 07/26/17 0818  LIPASE 24    HbA1C: Hgb A1c MFr Bld  Date/Time Value Ref Range Status  12/29/2016 09:32 PM 5.6 4.8 - 5.6 % Final    Comment:    (NOTE)         Pre-diabetes: 5.7 - 6.4         Diabetes: >6.4         Glycemic control for adults with diabetes: <7.0   04/15/2016 03:53 AM 6.4 (H) 4.8 - 5.6 % Final    Comment:    (NOTE)         Pre-diabetes: 5.7 - 6.4         Diabetes: >6.4         Glycemic control for adults with diabetes: <7.0     CBG:  Recent Labs Lab 07/28/17 2119 07/29/17 0748 07/29/17 1123 07/29/17 1614 07/29/17 2132  GLUCAP 102* 111* 287* 97 128*    Recent Results (from the past 240 hour(s))  Culture, blood (Routine X 2) w Reflex to ID Panel     Status: None (Preliminary result)   Collection Time: 07/26/17  8:18 AM  Result Value Ref Range Status   Specimen Description BLOOD LEFT HAND  Final   Special Requests   Final    BOTTLES DRAWN AEROBIC ONLY Blood Culture adequate volume   Culture NO GROWTH 4 DAYS  Final   Report Status PENDING  Incomplete  Culture, blood  (Routine X 2) w Reflex to ID Panel     Status: None (Preliminary result)   Collection Time: 07/26/17  9:30 AM  Result Value Ref Range Status   Specimen Description BLOOD LEFT HAND  Final   Special Requests   Final    BOTTLES DRAWN AEROBIC AND ANAEROBIC Blood Culture adequate volume   Culture NO GROWTH 4 DAYS  Final   Report Status PENDING  Incomplete  Culture, body fluid-bottle     Status: None (Preliminary result)   Collection Time: 07/26/17 11:13 AM  Result Value Ref Range Status   Specimen Description FLUID PERICARDIAL  Final   Special Requests NONE  Final   Culture NO GROWTH 4 DAYS  Final   Report Status PENDING  Incomplete  Gram stain     Status: None   Collection Time: 07/26/17 11:13 AM  Result Value Ref Range Status   Specimen Description FLUID PERICARDIAL  Final   Special Requests NONE  Final   Gram Stain   Final    FEW WBC PRESENT, PREDOMINANTLY PMN NO ORGANISMS SEEN    Report Status 07/26/2017 FINAL  Final  MRSA PCR Screening     Status: Abnormal   Collection Time: 07/26/17 12:35 PM  Result Value Ref Range Status   MRSA by PCR INVALID RESULTS, SPECIMEN SENT FOR CULTURE (A) NEGATIVE Final    Comment: RESULT CALLED TO, READ BACK BY AND VERIFIED WITH: S HINTZ,RN AT 1736 07/26/17 BY L BENFIELD        The GeneXpert MRSA Assay (FDA approved for NASAL specimens only), is one component of a comprehensive MRSA colonization surveillance program. It is not intended to diagnose MRSA infection nor to guide or monitor treatment for MRSA infections.   MRSA culture     Status: None   Collection Time: 07/26/17 12:35 PM  Result Value Ref Range Status   Specimen Description NASAL SWAB  Final   Special Requests NONE  Final   Culture NO MRSA DETECTED  Final   Report Status 07/28/2017 FINAL  Final  Culture, blood (routine x 2)     Status: None (Preliminary result)   Collection Time: 07/26/17  2:11 PM  Result Value Ref Range Status   Specimen Description BLOOD LEFT ARM  Final    Special Requests IN PEDIATRIC BOTTLE Blood Culture adequate volume  Final   Culture NO GROWTH 4 DAYS  Final   Report Status PENDING  Incomplete  Culture, blood (routine x 2)     Status: None (Preliminary result)   Collection Time: 07/26/17  2:11 PM  Result Value Ref Range Status   Specimen Description BLOOD LEFT HAND  Final   Special Requests IN PEDIATRIC BOTTLE Blood Culture adequate volume  Final   Culture NO GROWTH 4 DAYS  Final   Report Status PENDING  Incomplete     Scheduled Meds: . amiodarone  400 mg Oral BID  . amLODipine  10 mg Oral Q supper  . calcium acetate  1,334 mg Oral TID WC  . cinacalcet  60 mg Oral Once per day on Mon Wed Fri  . cloNIDine  0.2 mg Oral TID  . darbepoetin (ARANESP) injection - DIALYSIS  60 mcg Intravenous Q Wed-HD  . famotidine  20 mg Oral QHS  . gabapentin  100 mg Oral TID  . heparin  5,000 Units Subcutaneous Q8H  . hydrALAZINE  50 mg Oral TID  . insulin aspart  0-15 Units Subcutaneous TID WC  . insulin aspart  0-5 Units Subcutaneous QHS  . insulin glargine  15 Units Subcutaneous Daily  . isosorbide mononitrate  15 mg Oral Q1200  . latanoprost  1 drop Both Eyes QHS  . mouth rinse  15 mL Mouth Rinse BID     LOS: 4 days   Lonia Blood, MD [facility] Office  [phone] Pager - Text Page per Amion as per below:  On-Call/Text Page:      [email or web address]      password [email or web address]  If 7PM-7AM, please contact night-coverage [email or web address] Password [email or web address] 07/30/2017, 2:40 PM

## 2017-07-30 NOTE — Progress Notes (Signed)
Report give to Dialysis nurse. Transport will be coming to pick up patient for dialysis soon.

## 2017-07-31 LAB — FANA STAINING PATTERNS

## 2017-07-31 LAB — CULTURE, BODY FLUID W GRAM STAIN -BOTTLE: Culture: NO GROWTH

## 2017-07-31 LAB — CULTURE, BLOOD (ROUTINE X 2)
CULTURE: NO GROWTH
CULTURE: NO GROWTH
CULTURE: NO GROWTH
Culture: NO GROWTH
SPECIAL REQUESTS: ADEQUATE
SPECIAL REQUESTS: ADEQUATE
SPECIAL REQUESTS: ADEQUATE
SPECIAL REQUESTS: ADEQUATE

## 2017-07-31 LAB — BASIC METABOLIC PANEL
Anion gap: 14 (ref 5–15)
BUN: 29 mg/dL — AB (ref 6–20)
CHLORIDE: 90 mmol/L — AB (ref 101–111)
CO2: 27 mmol/L (ref 22–32)
CREATININE: 4.44 mg/dL — AB (ref 0.44–1.00)
Calcium: 8.7 mg/dL — ABNORMAL LOW (ref 8.9–10.3)
GFR calc Af Amer: 10 mL/min — ABNORMAL LOW (ref >60)
GFR, EST NON AFRICAN AMERICAN: 9 mL/min — AB (ref >60)
GLUCOSE: 173 mg/dL — AB (ref 65–99)
POTASSIUM: 4.2 mmol/L (ref 3.5–5.1)
SODIUM: 131 mmol/L — AB (ref 135–145)

## 2017-07-31 LAB — MAGNESIUM: MAGNESIUM: 1.8 mg/dL (ref 1.7–2.4)

## 2017-07-31 LAB — ANTINUCLEAR ANTIBODIES, IFA: ANA Ab, IFA: POSITIVE — AB

## 2017-07-31 LAB — GLUCOSE, CAPILLARY
GLUCOSE-CAPILLARY: 180 mg/dL — AB (ref 65–99)
GLUCOSE-CAPILLARY: 187 mg/dL — AB (ref 65–99)
Glucose-Capillary: 185 mg/dL — ABNORMAL HIGH (ref 65–99)
Glucose-Capillary: 136 mg/dL — ABNORMAL HIGH (ref 65–99)

## 2017-07-31 NOTE — Plan of Care (Signed)
Problem: Safety: Goal: Ability to remain free from injury will improve Outcome: Progressing Does have slightly unsteady gait , right side weaker holding on to furniture to navigate to bathroom  Problem: Pain Managment: Goal: General experience of comfort will improve Outcome: Not Progressing Continues to have general discomfort, states pain 10 out of 10 in shoulders and back and only gets down to 3-4 with medications, however sleeping in between meds.   Problem: Health Behavior: Goal: Ability to manage health-related needs will improve Outcome: Not Applicable Date Met: 33/00/76 duplicate

## 2017-07-31 NOTE — Clinical Social Work Placement (Signed)
   CLINICAL SOCIAL WORK PLACEMENT  NOTE  Date:  07/31/2017  Patient Details  Name: Colleen Mullins MRN: [id number] Date of Birth: 02/03/1946  Clinical Social Work is seeking post-discharge placement for this patient at the Skilled  Nursing Facility level of care (*CSW will initial, date and re-position this form in  chart as items are completed):  Yes   Patient/family provided with [facility] Clinical Social Work Department's list of facilities offering this level of care within the geographic area requested by the patient (or if unable, by the patient's family).  Yes   Patient/family informed of their freedom to choose among providers that offer the needed level of care, that participate in Medicare, Medicaid or managed care program needed by the patient, have an available bed and are willing to accept the patient.  Yes   Patient/family informed of [facility]'s ownership interest in [facility] and [facility], as well as of the fact that they are under no obligation to receive care at these facilities.  PASRR submitted to EDS on 07/30/17     PASRR number received on       Existing PASRR number confirmed on 07/30/17     FL2 transmitted to all facilities in geographic area requested by pt/family on 07/30/17     FL2 transmitted to all facilities within larger geographic area on       Patient informed that his/her managed care company has contracts with or will negotiate with certain facilities, including the following:        Yes   Patient/family informed of bed offers received.  [facility]     Physician recommends and patient chooses bed at      Patient to [facility] on  .  Patient to be transferred to facility by       Patient family notified on   of transfer.  Name of family member notified:        PHYSICIAN       Additional Comment:     _______________________________________________ Margarito LinerSarah C November Sypher, LCSW 07/31/2017, 11:47 AM

## 2017-07-31 NOTE — Progress Notes (Signed)
[facility] Progress Note  Subjective: up 1kg today, no c/o.  Cards signed off, prob viral pericarditis.   Vitals:   07/30/17 1927 07/30/17 2252 07/31/17 0406 07/31/17 0738  BP: (!) 126/51 (!) 122/52 130/64 (!) 138/53  Pulse: 72 69 63 66  Resp: 17 (!) 22 19 (!) 22  Temp: 97.9 F (36.6 C) (!) 97.4 F (36.3 C) 98.1 F (36.7 C) 98.5 F (36.9 C)  TempSrc: Oral Oral Oral Oral  SpO2: 97% 99% 93% 100%  Weight:      Height:        Inpatient medications: . amiodarone  400 mg Oral BID  . amLODipine  10 mg Oral Q supper  . calcium acetate  1,334 mg Oral TID WC  . cinacalcet  60 mg Oral Once per day on Mon Wed Fri  . cloNIDine  0.2 mg Oral TID  . darbepoetin (ARANESP) injection - DIALYSIS  60 mcg Intravenous Q Wed-HD  . famotidine  20 mg Oral QHS  . gabapentin  100 mg Oral TID  . heparin  5,000 Units Subcutaneous Q8H  . hydrALAZINE  50 mg Oral TID  . insulin aspart  0-15 Units Subcutaneous TID WC  . insulin aspart  0-5 Units Subcutaneous QHS  . insulin glargine  15 Units Subcutaneous Daily  . isosorbide mononitrate  30 mg Oral Q1200  . latanoprost  1 drop Both Eyes QHS  . mouth rinse  15 mL Mouth Rinse BID    acetaminophen, calcium acetate, cyclobenzaprine, diazepam, HYDROcodone-acetaminophen, ondansetron (ZOFRAN) IV  Exam: Alert, no distress No jvd Chest clear on L, dec'd R base RRR no mrg ABd soft ntnd no ascites, pericardial drain in subxiphoid space Ext no LE edema or wounds Ox 3, alert Thigh aVG +bruit  Dialysis:  MWF SW   4h  71kg  L thigh AVG  Hep 2500 (No heparin here d/t bloody pericard effusion) - Mircera IV q 2 weeks (last given on 8/22) - Hectoral IV q HD -home BP > norvasc 10/ clonidine 0.2 tid/ hydral 50 tid/ labetalol 300 bid/ losartan 100 hs   Assessment:   1. Pericardial effusion w/ tamponade - s/p urgent pericardiocentesis. Per cards prob viral cause.  Plan hold heparin w HD for 2 wks due to bloody effusion.   2. ESRD: HD MWF.  HD today.  No hep x 2 wks through 9/18.  3. HTN: was on five (5) BP meds at home, restarting as needed. Per primary.  4. Vol excess: up 1 kg today, stable vol.   5. Anemia: Hgb 9.3. Due for ESA , will give darbe 60 today on HD.  Follow. 6. Metabolic bone disease: Ca ok. Monitor. Will resume VDRA with next HD. 7. Type 2 DM: Per primary. 8. New onset atrial fibrillation: per cards pt on amiodarone, hoping will stay in NSR.  9. Hx CVA w/ R hemi/ facial droop  10. Dispo - ready for dc / SNF rec'd by PT  Plan - should be OK for dc, have d/w primary MD.     Vinson Moselle MD [facility] pager [phone]   07/31/2017, 11:06 AM    Recent Labs Lab 07/30/17 0800 07/30/17 1403 07/31/17 0821  NA 128* 133* 131*  K 4.3 3.9 4.2  CL 88* 90* 90*  CO2 GLUCOSE 113* 177* 173*  BUN 38* 13 29*  CREATININE 5.35* 3.02* 4.44*  CALCIUM 8.1* 8.8* 8.7*  PHOS 3.7 3.0  --  Recent Labs Lab 07/26/17 0818 07/28/17 0210 07/30/17 0800 07/30/17 1403  AST 42* 22  --   --   ALT 25 27  --   --   ALKPHOS 132* 135*  --   --   BILITOT 0.6 0.6  --   --   PROT 6.2* 6.0*  --   --   ALBUMIN 2.9* 2.7* 2.4* 2.9*    Recent Labs Lab 07/26/17 0818  07/27/17 0339 07/28/17 0210 07/30/17 0412  WBC 11.4*  < > 10.6* 10.2 7.7  NEUTROABS 8.6*  --   --   --   --   HGB 9.3*  < > 8.2* 9.2* 9.5*  HCT 30.2*  < > 26.1* 29.1* 29.5*  MCV 88.0  < > 85.0 86.1 84.0  PLT 533*  < > 499* 535* 476*  < > = values in this interval not displayed. Iron/TIBC/Ferritin/ %Sat    Component Value Date/Time   IRON 53 08/17/2010 0330   TIBC 359 08/17/2010 0330   IRONPCTSAT 15 (L) 08/17/2010 0330

## 2017-07-31 NOTE — Clinical Social Work Note (Signed)
Clinical Social Work Assessment  Patient Details  Name: Colleen Mullins MRN: [id number] Date of Birth: [birth year]  Date of referral:  07/31/17               Reason for consult:  Facility Placement, Discharge Planning                Permission sought to share information with:  Facility Sport and exercise psychologist, Family Supports Permission granted to share information::  Yes, Verbal Permission Granted  Name::     [place]  Agency::  SNF's  Relationship::  Daughter  Contact Information:  [phone]  Housing/Transportation Living arrangements for the past 2 months:  Apartment Source of Information:  Patient, Medical Team, Adult Children Patient Interpreter Needed:  None Criminal Activity/Legal Involvement Pertinent to Current Situation/Hospitalization:  No - Comment as needed Significant Relationships:  Adult Children, Other Family Members Lives with:  Self Do you feel safe going back to the place where you live?  Yes Need for family participation in patient care:  Yes (Comment)  Care giving concerns:  PT recommending SNF once medically stable for discharge.   Social Worker assessment / plan:  CSW met with patient. Daughter at bedside. CSW introduced role and explained that PT recommendations would be discussed. Patient and her daughter agreeable to SNF placement. [facility] preference. Admissions coordinator notified. CSW faxed clinicals to [facility] for authorization review. Patient states she was discharged from [facility] in August and has 11 SNF days left. Patient, her daughter, and CSW discussed living arrangements for after discharge from SNF. Low-income housing list and ALF list provided to patient and her daughter for review. No further concerns. CSW encouraged patient and her daughter to contact CSW as needed. CSW will continue to follow patient and her daughter for support and facilitate discharge to SNF once medically stable.  Employment status:  Retired Designer, industrial/product PT Recommendations:  [place] / Referral to community resources:  [place]  Patient/Family's Response to care:  Patient and her daughter agreeable to SNF placement. Patient's family supportive and involved in patient's care. Patient and her daughter appreciated social work intervention.  Patient/Family's Understanding of and Emotional Response to Diagnosis, Current Treatment, and Prognosis:  Patient and her daughter have a good understanding of the reason for admission and her need for rehab prior to returning home. Patient and her daughter appear happy with hospital care.  Emotional Assessment Appearance:  Appears stated age Attitude/Demeanor/Rapport:  Other (Pleasant) Affect (typically observed):  Accepting, Appropriate, Calm, Pleasant Orientation:  Oriented to Self, Oriented to Place, Oriented to  Time, Oriented to Situation Alcohol / Substance use:  Never Used Psych involvement (Current and /or in the community):  No (Comment)  Discharge Needs  Concerns to be addressed:  Care Coordination Readmission within the last 30 days:  No Current discharge risk:  Dependent with Mobility, Lives alone Barriers to Discharge:  Ship broker, Continued Medical Work up   Candie Chroman, LCSW 07/31/2017, 11:43 AM

## 2017-07-31 NOTE — Clinical Social Work Note (Addendum)
CSW confirmed with UM Manager at [facility] that they received the clinicals and were building the case for authorization.  Charlynn CourtSarah Freddye Cardamone, CSW [phone]  4:33 pm Authorization approved: [id number]. Patient will discharge to SNF tomorrow after dialysis. Patient aware. Left voicemail for daughter.  Charlynn CourtSarah Emmalee Solivan, CSW [phone]

## 2017-07-31 NOTE — Progress Notes (Signed)
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 She notes that the pain has been present for several months after a fall back in April. However, it should be noted that getting an accurate history seems difficult as the patient is quite anxious. She was noted in the emergency department to have a large pericardial effusion. She denied fever, chills, recent postprandial pain. She did have some nausea and vomiting the morning of admission.    Subjective: 9/6  A/O 4, negative CP, negative SOB, negative abdominal pain, negative N/V. States understands she is weak and [facility]      Assessment & Plan:   Active Problems:   Tamponade   Pericardial effusion   Pericardial effusion/Cardiac Tamponade -S/P emergent pericardiocentesis by cardiology 9/1 secondary to cardiac tamponade -Pericardial drain removed 9/3 -Currently asymptomatic   Cardiogenic shock -Resolved with resolving Not Resolved with resolving tamponade -Echocardiogram 9/2 normal LV function -Care per cardiology   New Onset Atrial Fibrillation(Chadsvasc is 5) -Resolved on 9/1 post pericardiocentesis, however back in A. fib after HD on 9/3 -Currently in NSR  -Amlodipine 10 mg daily -Amiodarone 400 mg BID -Imdur 30 mg daily -Hold anticoagulation secondary to risk for hemorrhagic pericardial effusion -Transfuse for hemoglobin <8  Essential HTN -Control per HD -See A. fib  Nonobstructive CAD -Noted on cardiac cath August 2017.  Cardiology following  Chronic respiratory failure with hypoxia -Per patient only uses O2 at home when she feels she is short of breath. However when questioned about how she was instructed to use home O2, invasive -Ambulatory SPO2 pending. Obtain prior to HD on 9/7   Acute RIGHT facial droop/RIGHT hemiparesis  -CT head/MRI/MRA brain negative for acute stroke -Stroke team has evaluated -PT/OT consulted   ESRD on HD M/W/F -ED per nephrology   DM Type 2 controlled with renal complication -2/4 Hemoglobin A1C=5.6 -Lantus 15 units daily -Moderate SSI    Cholecystitis? -Abdominal ultrasound negative for cholecystitis results below  Anemia of chronic kidney disease -No evidence of acute blood loss  Recent Labs Lab 07/26/17 0818 07/26/17 1411 07/26/17 2112 07/27/17 0339 07/28/17 0210 07/30/17 0412  HGB 9.3* 9.7* 10.2* 8.2* 9.2* 9.5*   -relatively stable     DVT prophylaxis: Subcutaneous heparin Code Status: Full Family Communication: None Disposition Plan: TBD   Consultants:  Stroke team Neurology Nephrology   Procedures/Significant Events:  9/1 CT head W Wo contrast:-Negative acute infarct-chronic white matter disease 9/1 MRI/MRA: Negative acute infarct.-Subcortical/periventricular white matter chronic microvascular ischemic changes -Negative MRA 9/2 Abdominal ultrasound RUQ: Negative cholecystitis-negative gallbladder wall thickening -Coarsened hepatic echotexture--> steatosis or hepatocellular disease      I have personally reviewed and interpreted all radiology studies and my findings are as above.  VENTILATOR SETTINGS: None   Cultures None  Antimicrobials: Anti-infectives    Start     Stop   07/26/17 2200  Ampicillin-Sulbactam (UNASYN) 3 g in sodium chloride 0.9 % 100 mL IVPB  Status:  Discontinued     07/27/17 0957   07/26/17 2100  Ampicillin-Sulbactam (UNASYN) 3 g in sodium chloride  0.9 % 100 mL IVPB  Status:  Discontinued     07/26/17 1320    07/26/17 0915  vancomycin (VANCOCIN) 1,500 mg in sodium chloride 0.9 % 500 mL IVPB     07/26/17 1026   07/26/17 0845  piperacillin-tazobactam (ZOSYN) IVPB 3.375 g     07/26/17 0922   07/26/17 0845  vancomycin (VANCOCIN) IVPB 1000 mg/200 mL premix  Status:  Discontinued     07/26/17 0903       Devices None   LINES / TUBES:  Pericardial drain 9/1>>> 9/3    Continuous Infusions:   Objective: Vitals:   07/31/17 1145 07/31/17 1200 07/31/17 1646 07/31/17 1922  BP: 137/64  (!) 142/65 (!) 141/67  Pulse: 66  65 68  Resp: 18   18  Temp:  98.4 F (36.9 C) 98.4 F (36.9 C) 97.6 F (36.4 C)  TempSrc:  Oral Oral Oral  SpO2: 98%   97%  Weight:      Height:        Intake/Output Summary (Last 24 hours) at 07/31/17 2007 Last data filed at 07/31/17 1800  Gross per 24 hour  Intake              240 ml  Output                0 ml  Net              240 ml   Filed Weights   07/30/17 0500 07/30/17 0750 07/30/17 1203  Weight: 166 lb 14.4 oz (75.7 kg) 165 lb 5.5 oz (75 kg) 158 lb 11.7 oz (72 kg)   Physical Exam:  General: A/O 4, No acute respiratory distress Neck:  Negative scars, masses, torticollis, lymphadenopathy, JVD Lungs: Clear to auscultation bilaterally without wheezes or crackles Cardiovascular: Regular rate and rhythm without murmur gallop or rub normal S1 and S2 Abdomen: negative abdominal pain, nondistended, positive soft, bowel sounds, no rebound, no ascites, no appreciable mass Extremities: No significant cyanosis, clubbing, or edema bilateral lower extremities Psychiatric:  Negative depression, negative anxiety, negative fatigue, negative mania  Central nervous system:  Cranial nerves II through XII intact, tongue/uvula midline, all extremities muscle strength 5/5, sensation intact throughout,  negative dysarthria, negative expressive aphasia, negative receptive aphasia.      Data Reviewed: Care during the described time interval was provided by me .  I have  reviewed this patient's available data, including medical history, events of note, physical examination, and all test results as part of my evaluation.   CBC:  Recent Labs Lab 07/26/17 0818 07/26/17 1411 07/26/17 2112 07/27/17 0339 07/28/17 0210 07/30/17 0412  WBC 11.4* 12.4*  --  10.6* 10.2 7.7  NEUTROABS 8.6*  --   --   --   --   --   HGB 9.3* 9.7* 10.2* 8.2* 9.2* 9.5*  HCT 30.2* 30.5* 30.0* 26.1* 29.1* 29.5*  MCV 88.0 86.4  --  85.0 86.1 84.0  PLT 533* 497*  --  499* 535* 476*   Basic Metabolic Panel:  Recent Labs Lab 07/27/17 0339 07/28/17 0210 07/30/17 0800 07/30/17 1403 07/31/17 0821  NA 135 134* 128* 133* 131*  K 3.5 4.2 4.3 3.9 4.2  CL 93* 94* 88* 90* 90*  CO2 GLUCOSE 228* 166* 113* 177* 173*  BUN 50* 67* 38* 13 29*  CREATININE 5.93* 7.48* 5.35* 3.02* 4.44*  CALCIUM 7.8* 8.4* 8.1* 8.8* 8.7*  MG  --   --   --   --  1.8  PHOS  --   --  3.7 3.0  --    GFR: Estimated Creatinine Clearance: 11.4 mL/min (A) (by C-G formula based on SCr of 4.44 mg/dL (H)). Liver Function Tests:  Recent Labs Lab 07/26/17 0818 07/28/17 0210 07/30/17 0800 07/30/17 1403  AST 42* 22  --   --   ALT 25 27  --   --   ALKPHOS 132* 135*  --   --   BILITOT 0.6 0.6  --   --   PROT 6.2* 6.0*  --   --   ALBUMIN 2.9* 2.7* 2.4* 2.9*    Recent Labs Lab 07/26/17 0818  LIPASE 24   No results for input(s): AMMONIA in the last 168 hours. Coagulation Profile: No results for input(s): INR, PROTIME in the last 168 hours. Cardiac Enzymes: No results for input(s): CKTOTAL, CKMB, CKMBINDEX, TROPONINI in the last 168 hours. BNP (last 3 results) No results for input(s): PROBNP in the last 8760 hours. HbA1C: No results for input(s): HGBA1C in the last 72 hours. CBG:  Recent Labs Lab 07/30/17 1705 07/30/17 2108 07/31/17 0757 07/31/17 1206 07/31/17 1705  GLUCAP 254* 98 180* 185* 187*   Lipid Profile: No results for input(s): CHOL, HDL, LDLCALC, TRIG, CHOLHDL,  LDLDIRECT in the last 72 hours. Thyroid Function Tests: No results for input(s): TSH, T4TOTAL, FREET4, T3FREE, THYROIDAB in the last 72 hours. Anemia Panel: No results for input(s): VITAMINB12, FOLATE, FERRITIN, TIBC, IRON, RETICCTPCT in the last 72 hours. Urine analysis:    Component Value Date/Time   COLORURINE YELLOW 10/16/2015 1847   APPEARANCEUR CLEAR 10/16/2015 1847   LABSPEC 1.013 10/16/2015 1847   PHURINE 8.5 (H) 10/16/2015 1847   GLUCOSEU NEGATIVE 10/16/2015 1847   HGBUR SMALL (A) 10/16/2015 1847   BILIRUBINUR NEGATIVE 10/16/2015 1847   KETONESUR NEGATIVE 10/16/2015 1847   PROTEINUR >300 (A) 10/16/2015 1847   UROBILINOGEN 0.2 12/11/2012 0719   NITRITE NEGATIVE 10/16/2015 1847   LEUKOCYTESUR NEGATIVE 10/16/2015 1847   Sepsis Labs: (procalcitonin:4,lacticidven:4)  ) Recent Results (from the past 240 hour(s))  Culture, blood (Routine X 2) w Reflex to ID Panel     Status: None   Collection Time: 07/26/17  8:18 AM  Result Value Ref Range Status   Specimen Description BLOOD LEFT HAND  Final   Special Requests   Final    BOTTLES DRAWN AEROBIC ONLY Blood Culture adequate volume   Culture NO GROWTH 5 DAYS  Final   Report Status 07/31/2017 FINAL  Final  Culture, blood (Routine X 2) w Reflex to ID Panel     Status: None   Collection Time: 07/26/17  9:30 AM  Result Value Ref Range Status   Specimen Description BLOOD LEFT HAND  Final   Special Requests   Final    BOTTLES DRAWN AEROBIC AND ANAEROBIC Blood Culture adequate volume   Culture NO GROWTH 5 DAYS  Final   Report Status 07/31/2017 FINAL  Final  Culture, body fluid-bottle     Status: None   Collection Time: 07/26/17 11:13 AM  Result Value Ref Range Status   Specimen Description FLUID PERICARDIAL  Final   Special Requests NONE  Final   Culture NO GROWTH 5 DAYS  Final   Report Status 07/31/2017 FINAL  Final  Gram stain     Status: None   Collection Time: 07/26/17 11:13 AM  Result Value Ref Range Status    Specimen Description FLUID PERICARDIAL  Final   Special Requests NONE  Final   Gram  Stain   Final    FEW WBC PRESENT, PREDOMINANTLY PMN NO ORGANISMS SEEN    Report Status 07/26/2017 FINAL  Final  MRSA PCR Screening     Status: Abnormal   Collection Time: 07/26/17 12:35 PM  Result Value Ref Range Status   MRSA by PCR INVALID RESULTS, SPECIMEN SENT FOR CULTURE (A) NEGATIVE Final    Comment: RESULT CALLED TO, READ BACK BY AND VERIFIED WITH: S HINTZ,RN AT 1736 07/26/17 BY L BENFIELD        The GeneXpert MRSA Assay (FDA approved for NASAL specimens only), is one component of a comprehensive MRSA colonization surveillance program. It is not intended to diagnose MRSA infection nor to guide or monitor treatment for MRSA infections.   MRSA culture     Status: None   Collection Time: 07/26/17 12:35 PM  Result Value Ref Range Status   Specimen Description NASAL SWAB  Final   Special Requests NONE  Final   Culture NO MRSA DETECTED  Final   Report Status 07/28/2017 FINAL  Final  Culture, blood (routine x 2)     Status: None   Collection Time: 07/26/17  2:11 PM  Result Value Ref Range Status   Specimen Description BLOOD LEFT ARM  Final   Special Requests IN PEDIATRIC BOTTLE Blood Culture adequate volume  Final   Culture NO GROWTH 5 DAYS  Final   Report Status 07/31/2017 FINAL  Final  Culture, blood (routine x 2)     Status: None   Collection Time: 07/26/17  2:11 PM  Result Value Ref Range Status   Specimen Description BLOOD LEFT HAND  Final   Special Requests IN PEDIATRIC BOTTLE Blood Culture adequate volume  Final   Culture NO GROWTH 5 DAYS  Final   Report Status 07/31/2017 FINAL  Final         Radiology Studies: No results found.      Scheduled Meds: . amiodarone  400 mg Oral BID  . amLODipine  10 mg Oral Q supper  . calcium acetate  1,334 mg Oral TID WC  . cinacalcet  60 mg Oral Once per day on Mon Wed Fri  . cloNIDine  0.2 mg Oral TID  . darbepoetin (ARANESP)  injection - DIALYSIS  60 mcg Intravenous Q Wed-HD  . famotidine  20 mg Oral QHS  . gabapentin  100 mg Oral TID  . heparin  5,000 Units Subcutaneous Q8H  . hydrALAZINE  50 mg Oral TID  . insulin aspart  0-15 Units Subcutaneous TID WC  . insulin aspart  0-5 Units Subcutaneous QHS  . insulin glargine  15 Units Subcutaneous Daily  . isosorbide mononitrate  30 mg Oral Q1200  . latanoprost  1 drop Both Eyes QHS  . mouth rinse  15 mL Mouth Rinse BID   Continuous Infusions:   LOS: 5 days    Time spent: 40 minutes    Tiphany Fayson, Roselind Messier, MD [facility] Pager [phone]   If 7PM-7AM, please contact night-coverage [email or web address] Password [email or web address] 07/31/2017, 8:07 PM

## 2017-07-31 NOTE — Progress Notes (Addendum)
Progress Note  Patient Name: Colleen Mullins Date of Encounter: 07/31/2017  Primary Cardiologist: Mayford Knife  Subjective   Feels better, no CP, no SOB. Waiting for SNF  Inpatient Medications    Scheduled Meds: . amiodarone  400 mg Oral BID  . amLODipine  10 mg Oral Q supper  . calcium acetate  1,334 mg Oral TID WC  . cinacalcet  60 mg Oral Once per day on Mon Wed Fri  . cloNIDine  0.2 mg Oral TID  . darbepoetin (ARANESP) injection - DIALYSIS  60 mcg Intravenous Q Wed-HD  . famotidine  20 mg Oral QHS  . gabapentin  100 mg Oral TID  . heparin  5,000 Units Subcutaneous Q8H  . hydrALAZINE  50 mg Oral TID  . insulin aspart  0-15 Units Subcutaneous TID WC  . insulin aspart  0-5 Units Subcutaneous QHS  . insulin glargine  15 Units Subcutaneous Daily  . isosorbide mononitrate  30 mg Oral Q1200  . latanoprost  1 drop Both Eyes QHS  . mouth rinse  15 mL Mouth Rinse BID   Continuous Infusions:  PRN Meds: acetaminophen, calcium acetate, cyclobenzaprine, diazepam, HYDROcodone-acetaminophen, ondansetron (ZOFRAN) IV   Vital Signs    Vitals:   07/30/17 1927 07/30/17 2252 07/31/17 0406 07/31/17 0738  BP: (!) 126/51 (!) 122/52 130/64 (!) 138/53  Pulse: 72 69 63 66  Resp: 17 (!) 22 19 (!) 22  Temp: 97.9 F (36.6 C) (!) 97.4 F (36.3 C) 98.1 F (36.7 C) 98.5 F (36.9 C)  TempSrc: Oral Oral Oral Oral  SpO2: 97% 99% 93% 100%  Weight:      Height:        Intake/Output Summary (Last 24 hours) at 07/31/17 0916 Last data filed at 07/30/17 2253  Gross per 24 hour  Intake            476.9 ml  Output             3500 ml  Net          -3023.1 ml   Filed Weights   07/30/17 0500 07/30/17 0750 07/30/17 1203  Weight: 166 lb 14.4 oz (75.7 kg) 165 lb 5.5 oz (75 kg) 158 lb 11.7 oz (72 kg)    Telemetry    AFIB>>to NSR about an hour after starting IV amioBack on 07/28/17. NSR with first degree AV block now. Personally Reviewed  ECG    NSR - Personally Reviewed  Physical Exam   GEN:  Well nourished, well developed, in no acute distress  HEENT: normal  Neck: no JVD, carotid bruits, or masses Cardiac: RRR; no murmurs, rubs, or gallops,no edema  Respiratory:  clear to auscultation bilaterally, normal work of breathing GI: soft, nontender, nondistended, + BS MS: no deformity or atrophy  Skin: warm and dry, no rash Neuro:  Alert and Oriented x 3, Strength and sensation are intact Psych: euthymic mood, full affect    Labs    Chemistry Recent Labs Lab 07/26/17 0818  07/28/17 0210 07/30/17 0800 07/30/17 1403  NA 132*  < > 134* 128* 133*  K 4.5  < > 4.2 4.3 3.9  CL 90*  < > 94* 88* 90*  CO2 20*  < > GLUCOSE 520*  < > 166* 113* 177*  BUN 35*  < > 67* 38* 13  CREATININE 5.09*  < > 7.48* 5.35* 3.02*  CALCIUM 8.6*  < > 8.4* 8.1* 8.8*  PROT 6.2*  --  6.0*  --   --  ALBUMIN 2.9*  --  2.7* 2.4* 2.9*  AST 42*  --  22  --   --   ALT 25  --  27  --   --   ALKPHOS 132*  --  135*  --   --   BILITOT 0.6  --  0.6  --   --   GFRNONAA 8*  < > 5* 7* 14*  GFRAA 9*  < > 6* 8* 17*  ANIONGAP 22*  < > 16* 14 15  < > = values in this interval not displayed.   Hematology  Recent Labs Lab 07/27/17 0339 07/28/17 0210 07/30/17 0412  WBC 10.6* 10.2 7.7  RBC 3.07* 3.38* 3.51*  HGB 8.2* 9.2* 9.5*  HCT 26.1* 29.1* 29.5*  MCV 85.0 86.1 84.0  MCH 26.7 27.2 27.1  MCHC 31.4 31.6 32.2  RDW 16.9* 17.0* 16.3*  PLT 499* 535* 476*    Cardiac EnzymesNo results for input(s): TROPONINI in the last 168 hours. No results for input(s): TROPIPOC in the last 168 hours.   BNPNo results for input(s): BNP, PROBNP in the last 168 hours.   DDimer No results for input(s): DDIMER in the last 168 hours.   Radiology    No results found.  Cardiac Studies   ECHO: Effusion resolved. LVH, normal EF  Patient Profile     71 y.o. female ESRD with cardiac tamponade, AFIB  Assessment & Plan    Cardiac tamponade  - post drain  - stable, excellent BP/HR.   - ECHO limited repeat  personally reviewed  - Possible viral etiol - had recent pneumonia. Cytology is negative, no signs of infection. Doubt uremic because of compliance with HD. Gram stain neg. Doubt lupus as she likely would have manifested this earlier.   Parox AFIB  - returned in HD 3 days ago but has been maintaining NSR currently.   - Amio to PO 400mg  PO BID for one week total then 200mg  PO QD thereafter.   - CHADSVASC 5  - No anticoagulation currently given recent effusion and concerns about hemorrhagic transformation. Trying to suppress afib with AMIO. If atrial fibrillation returns of course we will need to weigh the risks versus benefits of full anticoagulation at that time.  ESRD  - HD, stable  Non obstructive CAD  - Cath 8/17, stable  Will sign off. Please call if ?   Signed, Donato SchultzMark Makaio Mach, MD  07/31/2017, 9:16 AM

## 2017-08-01 DIAGNOSIS — Z862 Personal history of diseases of the blood and blood-forming organs and certain disorders involving the immune mechanism: Secondary | ICD-10-CM

## 2017-08-01 DIAGNOSIS — R570 Cardiogenic shock: Secondary | ICD-10-CM

## 2017-08-01 DIAGNOSIS — N186 End stage renal disease: Secondary | ICD-10-CM

## 2017-08-01 DIAGNOSIS — Z992 Dependence on renal dialysis: Secondary | ICD-10-CM

## 2017-08-01 DIAGNOSIS — N189 Chronic kidney disease, unspecified: Secondary | ICD-10-CM

## 2017-08-01 DIAGNOSIS — J9611 Chronic respiratory failure with hypoxia: Secondary | ICD-10-CM

## 2017-08-01 DIAGNOSIS — E1121 Type 2 diabetes mellitus with diabetic nephropathy: Secondary | ICD-10-CM

## 2017-08-01 DIAGNOSIS — I10 Essential (primary) hypertension: Secondary | ICD-10-CM

## 2017-08-01 DIAGNOSIS — I4891 Unspecified atrial fibrillation: Secondary | ICD-10-CM

## 2017-08-01 LAB — RENAL FUNCTION PANEL
ALBUMIN: 2.7 g/dL — AB (ref 3.5–5.0)
Anion gap: 14 (ref 5–15)
BUN: 45 mg/dL — AB (ref 6–20)
CALCIUM: 9.1 mg/dL (ref 8.9–10.3)
CHLORIDE: 90 mmol/L — AB (ref 101–111)
CO2: 24 mmol/L (ref 22–32)
CREATININE: 6.34 mg/dL — AB (ref 0.44–1.00)
GFR calc Af Amer: 7 mL/min — ABNORMAL LOW (ref >60)
GFR, EST NON AFRICAN AMERICAN: 6 mL/min — AB (ref >60)
Glucose, Bld: 247 mg/dL — ABNORMAL HIGH (ref 65–99)
Phosphorus: 4.5 mg/dL (ref 2.5–4.6)
Potassium: 4.9 mmol/L (ref 3.5–5.1)
SODIUM: 128 mmol/L — AB (ref 135–145)

## 2017-08-01 LAB — GLUCOSE, CAPILLARY
GLUCOSE-CAPILLARY: 168 mg/dL — AB (ref 65–99)
Glucose-Capillary: 169 mg/dL — ABNORMAL HIGH (ref 65–99)
Glucose-Capillary: 193 mg/dL — ABNORMAL HIGH (ref 65–99)

## 2017-08-01 LAB — CBC
HCT: 30.4 % — ABNORMAL LOW (ref 36.0–46.0)
Hemoglobin: 9.5 g/dL — ABNORMAL LOW (ref 12.0–15.0)
MCH: 26.5 pg (ref 26.0–34.0)
MCHC: 31.3 g/dL (ref 30.0–36.0)
MCV: 84.7 fL (ref 78.0–100.0)
PLATELETS: 512 10*3/uL — AB (ref 150–400)
RBC: 3.59 MIL/uL — AB (ref 3.87–5.11)
RDW: 16.3 % — AB (ref 11.5–15.5)
WBC: 13.8 10*3/uL — AB (ref 4.0–10.5)

## 2017-08-01 MED ORDER — "PEN NEEDLES 1/2"" 29G X 12MM MISC": 15.0000 [IU] | Freq: Every day | 0 refills | Status: DC

## 2017-08-01 MED ORDER — AMIODARONE HCL 400 MG PO TABS: 400.0000 mg | ORAL_TABLET | Freq: Two times a day (BID) | ORAL | 0 refills | Status: DC

## 2017-08-01 MED ORDER — FAMOTIDINE 20 MG PO TABS: 20.0000 mg | ORAL_TABLET | Freq: Every day | ORAL | 0 refills | Status: AC

## 2017-08-01 MED ORDER — HYDROCODONE-ACETAMINOPHEN 5-325 MG PO TABS
ORAL_TABLET | ORAL | Status: AC
  Administered 2017-08-01: 1 via ORAL
  Filled 2017-08-01: qty 1

## 2017-08-01 MED ORDER — INSULIN GLARGINE 100 UNITS/ML SOLOSTAR PEN: 15.0000 [IU] | PEN_INJECTOR | Freq: Every day | SUBCUTANEOUS | 0 refills | Status: DC

## 2017-08-01 MED ORDER — ACETAMINOPHEN 325 MG PO TABS
ORAL_TABLET | ORAL | Status: AC
  Filled 2017-08-01: qty 2

## 2017-08-01 MED ORDER — ISOSORBIDE MONONITRATE ER 30 MG PO TB24: 30.0000 mg | ORAL_TABLET | Freq: Every day | ORAL | 0 refills | Status: DC

## 2017-08-01 NOTE — Progress Notes (Signed)
PT Cancellation Note  Patient Details Name: Colleen PlowmanDianne Mullins MRN: [id number] DOB: 01/07/1946   Cancelled Treatment:    Reason Eval/Treat Not Completed: Patient at procedure or test/unavailable (In hemodialysis.  Will return in pm.  Thanks.)   Berline Lopesawn F Lanecia Sliva 08/01/2017, 10:12 AM Eber Jonesawn Lasundra Hascall,PT Acute Rehabilitation [phone] (pager)

## 2017-08-01 NOTE — Progress Notes (Signed)
SATURATION QUALIFICATIONS: (This note is used to comply with regulatory documentation for home oxygen)  Patient Saturations on Room Air at Rest = 92-95%  Patient Saturations on Room Air while Ambulating = Unable to ambulate   Patient Saturations on Room Air while Standing = 73%  Patient Saturations on 3 Liters of oxygen while Standing = >90%  Please briefly explain why patient needs home oxygen:Pt failed alternative measures without O2.  Needs supplemental O2.  Thanks.  [facility] Georgia[facility]wn Shanyiah Conde,PT Acute Rehabilitation [phone] (pager)

## 2017-08-01 NOTE — Progress Notes (Signed)
Dialysis treatment completed.  4500 mL ultrafiltrated and net fluid removal 4000 mL.    Patient status unchanged. Lung sounds diminished to ausculation in all fields. Generalized edema. Cardiac: NSR.  Disconnected lines and removed needles.  Pressure held for 10 minutes and band aid/gauze dressing applied.  Report given to bedside RN, Chat.

## 2017-08-01 NOTE — Progress Notes (Signed)
Physical Therapy Treatment Patient Details Name: Colleen Mullins MRN: [id number] DOB: 1946/09/17 Today's Date: 08/01/2017    History of Present Illness Indianna Boran is an 71 y.o. female with end stage renal disease, type 2 diabetes mellitus, coronary artery disease, hypertension presented with right-sided chest pain. She was found to have large pericardial effusion as well as new onset atrial fibrillation. Cardiology performed an echo showing RV collapse and tamponade and she underwent a pericardiocentesis which she had 500 ml fluid and tube placed.  Code stroke called while pt hospitalized but CT neg and R sided weakness attributed to pt's h/o Bell's palsy     PT Comments    Pt admitted with above diagnosis. Pt currently with functional limitations due to balance and endurance deficits. Pt was limited by dizziness and desaturation today. MD made aware.  Will need continued therapy at SNF.   Pt will benefit from skilled PT to increase their independence and safety with mobility to allow discharge to the venue listed below.    SATURATION QUALIFICATIONS: (This note is used to comply with regulatory documentation for home oxygen)  Patient Saturations on Room Air at Rest = 92-95%  Patient Saturations on Room Air while Ambulating = Unable to ambulate   Patient Saturations on Room Air while Standing = 73%  Patient Saturations on 3 Liters of oxygen while Standing = >90%  Please briefly explain why patient needs home oxygen:Pt failed alternative measures without O2.  Needs supplemental O2.    Follow Up Recommendations  SNF     Equipment Recommendations  None recommended by PT    Recommendations for Other Services       Precautions / Restrictions Precautions Precautions: Fall Precaution Comments: has had multiple falls at home (2 in last month) Restrictions Weight Bearing Restrictions: Yes    Mobility  Bed Mobility Overal bed mobility: Needs Assistance Bed Mobility: Supine to  Sit     Supine to sit: Mod assist     General bed mobility comments: Pt had difficulty moving her LEs to EOB needing help and needed assist to elevate trunk as well.   Transfers Overall transfer level: Needs assistance Equipment used: Rolling walker (2 wheeled) Transfers: Sit to/from Stand Sit to Stand: Mod assist         General transfer comment: verbal cues for hand placement.  Needed assist to power up.  once up needing mod assist and cues to maintain standing.  Pt only stood for close to a minute and sats dropped to 73% and pt stated she was dizzy.  BP stable.  Placed O2 on pt and sats up to 94% within 20 seconds.  MD made aware. Pt stated she had to lie down therefore assisted her back to supine.    Ambulation/Gait                 Stairs            Wheelchair Mobility    Modified Rankin (Stroke Patients Only)       Balance Overall balance assessment: Needs assistance Sitting-balance support: Bilateral upper extremity supported;Feet supported Sitting balance-Leahy Scale: Poor Sitting balance - Comments: relying on UEs support.   Standing balance support: During functional activity;Bilateral upper extremity supported Standing balance-Leahy Scale: Poor Standing balance comment: requires UE support and mod assist tostand with RW                            Cognition Arousal/Alertness: Awake/alert Behavior  During Therapy: WFL for tasks assessed/performed Overall Cognitive Status: Within Functional Limits for tasks assessed                                        Exercises      General Comments        Pertinent Vitals/Pain Pain Assessment: Faces Faces Pain Scale: Hurts whole lot Pain Location: shoulders with movement, hips Pain Descriptors / Indicators: Grimacing Pain Intervention(s): Limited activity within patient's tolerance;Monitored during session;Repositioned    Home Living                      Prior  Function            PT Goals (current goals can now be found in the care plan section) Progress towards PT goals: Not progressing toward goals - comment (dizziness and desat)    Frequency    Min 3X/week      PT Plan Current plan remains appropriate    Co-evaluation              AM-PAC PT "6 Clicks" Daily Activity  Outcome Measure  Difficulty turning over in bed (including adjusting bedclothes, sheets and blankets)?: Unable Difficulty moving from lying on back to sitting on the side of the bed? : Unable Difficulty sitting down on and standing up from a chair with arms (e.g., wheelchair, bedside commode, etc,.)?: Unable Help needed moving to and from a bed to chair (including a wheelchair)?: A Lot Help needed walking in hospital room?: Total Help needed climbing 3-5 steps with a railing? : Total 6 Click Score: 7    End of Session Equipment Utilized During Treatment: Gait belt Activity Tolerance: Patient limited by fatigue (limited by dizziness) Patient left: with call bell/phone within reach;with nursing/sitter in room;in bed Nurse Communication: Mobility status;Need for lift equipment PT Visit Diagnosis: Unsteadiness on feet (R26.81);Repeated falls (R29.6);Muscle weakness (generalized) (M62.81)     Time: [id number] PT Time Calculation (min) (ACUTE ONLY): 18 min  Charges:  $Therapeutic Activity: 8-22 mins                    G Codes:       Ediberto Sens,PT Acute Rehabilitation [phone] (pager)    Berline Lopesawn F Sani Loiseau 08/01/2017, 4:14 PM

## 2017-08-01 NOTE — Clinical Social Work Note (Signed)
CSW facilitated patient discharge including contacting patient family and facility to confirm patient discharge plans. Clinical information faxed to facility and family agreeable with plan. CSW arranged ambulance transport via [facility] to [facility]. RN to call report prior to discharge [phone] Room 215).  CSW will sign off for now as social work intervention is no longer needed. Please consult us again if new needs arise.  Charlynn CourtSarah Stephany Poorman, CSW [phone]

## 2017-08-01 NOTE — Progress Notes (Signed)
[facility] ROUNDING NOTE   Subjective:   Interval History: Pericardial effusion/Cardiac Tamponade S/P emergent pericardiocentesis by cardiology 9/1 secondary to cardiac tamponade with removal of drain 9/3 - possible viral syndrome ( per cardiology)  Cardiogenic shock resolved with resolving tamponade -Echocardiogram 9/2 normal LV function. New Onset Atrial Fibrillation(Chadsvasc is 5) Hold anticoagulation secondary to risk for hemorrhagic pericardial effusion  End Stage Renal Disease - [facility] MWF dialysis    Dialysis:  MWF SW   4h  71kg  L thigh AVG  Hep 2500 (No heparin here d/t bloody pericard effusion) - Mircera IV q 2 weeks (last given on 8/22) - Hectoral IV q HD  No Complaints this morning      Objective:  Vital signs in last 24 hours:  Temp:  [97.6 F (36.4 C)-99.3 F (37.4 C)] 99.3 F (37.4 C) (09/07 0751) Pulse Rate:  [63-104] 80 (09/07 0751) Resp:  [12-18] 16 (09/07 0245) BP: (137-143)/(64-68) 143/67 (09/07 0245) SpO2:  [94 %-100 %] 99 % (09/07 0751) Weight:  [166 lb (75.3 kg)] 166 lb (75.3 kg) (09/07 0606)  Weight change: 10.5 oz (0.297 kg) Filed Weights   07/30/17 0750 07/30/17 1203 08/01/17 0606  Weight: 165 lb 5.5 oz (75 kg) 158 lb 11.7 oz (72 kg) 166 lb (75.3 kg)    Intake/Output: I/O last 3 completed shifts: In: 360 [P.O.:360] Out: -    Intake/Output this shift:  No intake/output data recorded.  CVS- RRR RS- CTA diminished at bases  ABD- BS present soft non-distended EXT-  Thigh AVG  Bruit     Basic Metabolic Panel:  Recent Labs Lab 07/27/17 0339 07/28/17 0210 07/30/17 0800 07/30/17 1403 07/31/17 0821  NA 135 134* 128* 133* 131*  K 3.5 4.2 4.3 3.9 4.2  CL 93* 94* 88* 90* 90*  CO2 GLUCOSE 228* 166* 113* 177* 173*  BUN 50* 67* 38* 13 29*  CREATININE 5.93* 7.48* 5.35* 3.02* 4.44*  CALCIUM 7.8* 8.4* 8.1* 8.8* 8.7*  MG  --   --   --   --  1.8  PHOS  --   --  3.7 3.0  --     Liver  Function Tests:  Recent Labs Lab 07/26/17 0818 07/28/17 0210 07/30/17 0800 07/30/17 1403  AST 42* 22  --   --   ALT 25 27  --   --   ALKPHOS 132* 135*  --   --   BILITOT 0.6 0.6  --   --   PROT 6.2* 6.0*  --   --   ALBUMIN 2.9* 2.7* 2.4* 2.9*    Recent Labs Lab 07/26/17 0818  LIPASE 24   No results for input(s): AMMONIA in the last 168 hours.  CBC:  Recent Labs Lab 07/26/17 0818 07/26/17 1411 07/26/17 2112 07/27/17 0339 07/28/17 0210 07/30/17 0412  WBC 11.4* 12.4*  --  10.6* 10.2 7.7  NEUTROABS 8.6*  --   --   --   --   --   HGB 9.3* 9.7* 10.2* 8.2* 9.2* 9.5*  HCT 30.2* 30.5* 30.0* 26.1* 29.1* 29.5*  MCV 88.0 86.4  --  85.0 86.1 84.0  PLT 533* 497*  --  499* 535* 476*    Cardiac Enzymes: No results for input(s): CKTOTAL, CKMB, CKMBINDEX, TROPONINI in the last 168 hours.  BNP: Invalid input(s): POCBNP  CBG:  Recent Labs Lab 07/31/17 0757 07/31/17 1206 07/31/17 1705 07/31/17 2113 08/01/17 0830  GLUCAP 180* 185* 187*  136* 168*    Microbiology: Results for orders placed or performed during the hospital encounter of 07/26/17  Culture, blood (Routine X 2) w Reflex to ID Panel     Status: None   Collection Time: 07/26/17  8:18 AM  Result Value Ref Range Status   Specimen Description BLOOD LEFT HAND  Final   Special Requests   Final    BOTTLES DRAWN AEROBIC ONLY Blood Culture adequate volume   Culture NO GROWTH 5 DAYS  Final   Report Status 07/31/2017 FINAL  Final  Culture, blood (Routine X 2) w Reflex to ID Panel     Status: None   Collection Time: 07/26/17  9:30 AM  Result Value Ref Range Status   Specimen Description BLOOD LEFT HAND  Final   Special Requests   Final    BOTTLES DRAWN AEROBIC AND ANAEROBIC Blood Culture adequate volume   Culture NO GROWTH 5 DAYS  Final   Report Status 07/31/2017 FINAL  Final  Culture, body fluid-bottle     Status: None   Collection Time: 07/26/17 11:13 AM  Result Value Ref Range Status   Specimen Description  FLUID PERICARDIAL  Final   Special Requests NONE  Final   Culture NO GROWTH 5 DAYS  Final   Report Status 07/31/2017 FINAL  Final  Gram stain     Status: None   Collection Time: 07/26/17 11:13 AM  Result Value Ref Range Status   Specimen Description FLUID PERICARDIAL  Final   Special Requests NONE  Final   Gram Stain   Final    FEW WBC PRESENT, PREDOMINANTLY PMN NO ORGANISMS SEEN    Report Status 07/26/2017 FINAL  Final  MRSA PCR Screening     Status: Abnormal   Collection Time: 07/26/17 12:35 PM  Result Value Ref Range Status   MRSA by PCR INVALID RESULTS, SPECIMEN SENT FOR CULTURE (A) NEGATIVE Final    Comment: RESULT CALLED TO, READ BACK BY AND VERIFIED WITH: S HINTZ,RN AT 1736 07/26/17 BY L BENFIELD        The GeneXpert MRSA Assay (FDA approved for NASAL specimens only), is one component of a comprehensive MRSA colonization surveillance program. It is not intended to diagnose MRSA infection nor to guide or monitor treatment for MRSA infections.   MRSA culture     Status: None   Collection Time: 07/26/17 12:35 PM  Result Value Ref Range Status   Specimen Description NASAL SWAB  Final   Special Requests NONE  Final   Culture NO MRSA DETECTED  Final   Report Status 07/28/2017 FINAL  Final  Culture, blood (routine x 2)     Status: None   Collection Time: 07/26/17  2:11 PM  Result Value Ref Range Status   Specimen Description BLOOD LEFT ARM  Final   Special Requests IN PEDIATRIC BOTTLE Blood Culture adequate volume  Final   Culture NO GROWTH 5 DAYS  Final   Report Status 07/31/2017 FINAL  Final  Culture, blood (routine x 2)     Status: None   Collection Time: 07/26/17  2:11 PM  Result Value Ref Range Status   Specimen Description BLOOD LEFT HAND  Final   Special Requests IN PEDIATRIC BOTTLE Blood Culture adequate volume  Final   Culture NO GROWTH 5 DAYS  Final   Report Status 07/31/2017 FINAL  Final    Coagulation Studies: No results for input(s): LABPROT, INR in  the last 72 hours.  Urinalysis: No results for input(s): COLORURINE, LABSPEC,  PHURINE, GLUCOSEU, HGBUR, BILIRUBINUR, KETONESUR, PROTEINUR, UROBILINOGEN, NITRITE, LEUKOCYTESUR in the last 72 hours.  Invalid input(s): APPERANCEUR    Imaging: No results found.   Medications:    . amiodarone  400 mg Oral BID  . amLODipine  10 mg Oral Q supper  . calcium acetate  1,334 mg Oral TID WC  . cinacalcet  60 mg Oral Once per day on Mon Wed Fri  . cloNIDine  0.2 mg Oral TID  . darbepoetin (ARANESP) injection - DIALYSIS  60 mcg Intravenous Q Wed-HD  . famotidine  20 mg Oral QHS  . gabapentin  100 mg Oral TID  . heparin  5,000 Units Subcutaneous Q8H  . hydrALAZINE  50 mg Oral TID  . insulin aspart  0-15 Units Subcutaneous TID WC  . insulin aspart  0-5 Units Subcutaneous QHS  . insulin glargine  15 Units Subcutaneous Daily  . isosorbide mononitrate  30 mg Oral Q1200  . latanoprost  1 drop Both Eyes QHS  . mouth rinse  15 mL Mouth Rinse BID   acetaminophen, calcium acetate, cyclobenzaprine, diazepam, HYDROcodone-acetaminophen, ondansetron (ZOFRAN) IV  Assessment/ Plan:  1. Pericardial effusion w/ tamponade - s/p urgent pericardiocentesis. Per cards prob viral cause.  Plan hold heparin w HD for 2 wks due to bloody effusion.  2. ESRD: HD MWF.  HD today.  No hep x 2 wks through 9/18.  3. HTN:  Appears to have improved with good control   4. Vol excess:  Resolved will continue to challenge dry weight at outpatient  5. Anemia: Hgb 9.5 no issues appears stable continued dose of ESA  6. Metabolic bone disease: Ca ok. Monitor. Will resume VDRA with next HD. 7. Type 2 DM: Per primary. 8. New onset atrial fibrillation: per cards pt on amiodarone dose  BID  Will need to be titrated down and follow up with cardiology , hoping will stay in NSR.  9. Hx CVA w/ R hemi/ facial droop  10. Dispo - ready for dc / SNF rec'd by PT  Patient appears stable from renal standpoint for dialysis    LOS:  6 Colleen Mullins W  :32 AM

## 2017-08-01 NOTE — Progress Notes (Signed)
Patient arrived to unit per bed.  Reviewed treatment plan and this RN agrees.  Report received from bedside RN, Chat.  Consent obtained.  Patient A & O X 4. Lung sounds diminished to ausculation in all fields. Generalized  edema. Cardiac: NSR.  Prepped RLAVG with alcohol and cannulated with two 15 gauge needles.  Pulsation of blood noted.  Flushed access well with saline per protocol.  Connected and secured lines and initiated tx at 0937.  UF goal of 4500 mL and net fluid removal of 4000 mL.  Will continue to monitor.

## 2017-08-01 NOTE — Clinical Social Work Placement (Signed)
   CLINICAL SOCIAL WORK PLACEMENT  NOTE  Date:  08/01/2017  Patient Details  Name: Colleen Mullins MRN: [id number] Date of Birth: 12/11/1945  Clinical Social Work is seeking post-discharge placement for this patient at the Skilled  Nursing Facility level of care (*CSW will initial, date and re-position this form in  chart as items are completed):  Yes   Patient/family provided with [facility] Clinical Social Work Department's list of facilities offering this level of care within the geographic area requested by the patient (or if unable, by the patient's family).  Yes   Patient/family informed of their freedom to choose among providers that offer the needed level of care, that participate in Medicare, Medicaid or managed care program needed by the patient, have an available bed and are willing to accept the patient.  Yes   Patient/family informed of 's ownership interest in [facility] and [facility], as well as of the fact that they are under no obligation to receive care at these facilities.  PASRR submitted to EDS on 07/30/17     PASRR number received on       Existing PASRR number confirmed on 07/30/17     FL2 transmitted to all facilities in geographic area requested by pt/family on 07/30/17     FL2 transmitted to all facilities within larger geographic area on       Patient informed that his/her managed care company has contracts with or will negotiate with certain facilities, including the following:        Yes   Patient/family informed of bed offers received.  Patient [facility] bed [facility]     Physician recommends and patient chooses bed at      Patient to be [facility] on 08/01/17.  [facility]     Patient family notified on 08/01/17 of transfer.  Name of family member notified:  [name]     PHYSICIAN Please prepare prescriptions     Additional  Comment:    _______________________________________________ Margarito LinerSarah C Keysi Oelkers, LCSW 08/01/2017, 5:07 PM

## 2017-08-01 NOTE — Progress Notes (Signed)
Transported to dialysis dept by bed.stable.

## 2017-08-01 NOTE — Progress Notes (Signed)
Still waiting for [facility] ambulance to come and pick her for transport  To [facility]. Report given to staff of the said facility.

## 2017-08-01 NOTE — Progress Notes (Signed)
Back from hemodialysis by bed.sleepy but arousable.

## 2017-08-01 NOTE — Discharge Summary (Signed)
Physician Discharge Summary  Rennie PlowmanDianne Teachey DGL:[id number] DOB: 11/14/1946 DOA: 07/26/2017  PCP: Zoila ShutterWoodyear, Wynne E, MD  Admit date: 07/26/2017 Discharge date: 08/01/2017  Time spent: 35 minutes  Recommendations for Outpatient Follow-up:  Pericardial effusion/Cardiac Tamponade -S/P emergent pericardiocentesis by cardiology 9/1 secondary to cardiac tamponade -Pericardial drain removed 9/3 -Schedule follow-up with Dr. Donato SchultzMark [facility]ains [facility] in 1-2 weeks pericardial effusion, Cardiac Tamponade, new onset atrial fibrillation    Cardiogenic shock -Resolved with resolving Not Resolved with resolving tamponade -Echocardiogram 9/2 normal LV function -Schedule in 1-2 weeks with Dr. Tilford PillarWynne Woodyear pericardial effusion, cardiogenic shock, new onset A. fib,   New Onset Atrial Fibrillation(Chadsvasc is 5) -Resolved on 9/1 post pericardiocentesis, however back in A. fib after HD on 9/3 -Currently in NSR  -Amlodipine 10 mg daily -Amiodarone 400 mg BID -Imdur 30 mg daily -Hold anticoagulation secondary to risk for hemorrhagic pericardial effusion -Transfuse for hemoglobin <8   Essential HTN -Control per HD -See A. fib   Nonobstructive CAD -Noted on cardiac cath August 2017. Cardiology following. Patient will follow-up with cardiology see cardiogenic shock   Chronic respiratory failure with hypoxia -SATURATION QUALIFICATIONS: (This note is used to comply with regulatory documentation for home oxygen) Patient Saturations on Room Air at Rest = 92-95% Patient Saturations on Room Air while Ambulating = Unable to ambulate  Patient Saturations on Room Air while Standing = 73% Patient Saturations on 3 Liters of oxygen while Standing = >90% Please briefly explain why patient needs home oxygen:Pt failed alternative measures without O2. -Patient understands will be discharged on 3 L O2 via [place] 24 hours per day. Will titrate to maintain SPO2> 93%   Acute RIGHT facial droop/RIGHT hemiparesis  -CT head/MRI/MRA  brain negative for acute stroke -Stroke team has evaluated -PT/OT: Recommended SNF. 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 -2/4 Hemoglobin A1C=5.6 -Lantus 15 units daily   Anemia of chronic kidney disease -No evidence of acute blood loss   Recent Labs Lab 07/26/17 1411 07/26/17 2112 07/27/17 0339 07/28/17 0210 07/30/17 0412 08/01/17 0939  HGB 9.7* 10.2* 8.2* 9.2* 9.5* 9.5*   -relatively stable    Discharge Diagnoses:  Active Problems:   Tamponade   Pericardial effusion   Discharge Condition: Stable  Diet recommendation:American diabetic Association/heart healthy  Filed Weights   08/01/17 0606 08/01/17 0929 08/01/17 1334  Weight: 166 lb (75.3 kg) 167 lb 1.7 oz (75.8 kg) 158 lb 4.6 oz (71.8 kg)    History of present illness:  71 year old BF  PMHx ESRD on HD M/W/F, CAD in native artery, Chronic Diastolic CHF, HTN, PAD, PVD, HLD, Diabetes mellitus Type  2 uncontrolled with renal complication; GERD (gastroesophageal reflux disease); Glaucoma; H/O: Bell's palsy (2007); Macular degeneration;  Pseudoaneurysm of arteriovenous graft    [facility] 07/26/2017 complaining of right-sided pain. She notes that the pain has been present for several months after a fall back in April. However, it should be noted that getting an accurate history seems difficult as the patient is quite anxious. She was noted in the emergency department to have a large pericardial effusion. She denied fever, chills, recent postprandial pain. She did have some nausea and vomiting the morning of admission During this hospitalization patient was treated for pericardial effusion with cardiac tamponade. Cardiology placed pericardial drain successfully and now patient asymptomatic. Patient states, complicated by new onset atrial fibrillation most likely  secondary to her pericardial  effusion with tamponade physiology. Atrial fibrillation resolved post pericardiocentesis, however patient required amiodarone to maintain NSR. Patient was found to have chronic respiratory failure with hypoxia requiring home O2. Patient was counseled at length on necessity of using home O2 as directed. Stable for discharge    Procedures: 9/1 CT head W Wo contrast:-Negative acute infarct-chronic white matter disease 9/1 MRI/MRA: Negative acute infarct.-Subcortical/periventricular white matter chronic microvascular ischemic changes -Negative MRA 9/2 Abdominal ultrasound RUQ: Negative cholecystitis-negative gallbladder wall thickening -Coarsened hepatic echotexture--> steatosis or hepatocellular disease    Consultations: Stroke team Neurology Nephrology   Cultures   9/1 MRSA by PCR negative 9/1 blood negative 9/1 pericardial fluid negative     Antibiotics Anti-infectives    Start     Stop   07/26/17 2200  Ampicillin-Sulbactam (UNASYN) 3 g in sodium chloride 0.9 % 100 mL IVPB  Status:  Discontinued     07/27/17 0957   07/26/17 2100  Ampicillin-Sulbactam (UNASYN) 3 g in sodium chloride 0.9 % 100 mL IVPB  Status:  Discontinued     07/26/17 1320   07/26/17 0915  vancomycin (VANCOCIN) 1,500 mg in sodium chloride 0.9 % 500 mL IVPB     07/26/17 1026   07/26/17 0845  piperacillin-tazobactam (ZOSYN) IVPB 3.375 g     07/26/17 0922   07/26/17 0845  vancomycin (VANCOCIN) IVPB 1000 mg/200 mL premix  Status:  Discontinued     07/26/17 0903       Discharge Exam: Vitals:   08/01/17 1230 08/01/17 1300 08/01/17 1334 08/01/17 1336  BP: 134/70 137/64 126/72 (!) 148/69  Pulse: 83 85 87 86  Resp:   (!) 28   Temp:   98.3 F (36.8 C)   TempSrc:      SpO2:      Weight:   158 lb 4.6 oz (71.8 kg)   Height:        General: A/O 4, positive chronic respiratory distress Cardiovascular: Regular in rate, negative murmurs rubs or gallops, normal S1/S2 Respiratory: Clear to auscultation  bilateral, negative wheezes, negative crackles   Discharge Instructions   Allergies as of 08/01/2017      Reactions   Iodinated Diagnostic Agents Hives, Itching   Lactose Intolerance (gi) Other (See Comments)   Abdominal pains and bloating      Medication List    STOP taking these medications   insulin aspart 100 UNIT/ML injection Commonly known as:  novoLOG   labetalol 300 MG tablet Commonly known as:  NORMODYNE   losartan 100 MG tablet Commonly known as:  COZAAR   ranitidine 75 MG tablet Commonly known as:  ZANTAC Replaced by:  famotidine 20 MG tablet     TAKE these medications   acetaminophen 650 MG CR tablet Commonly known as:  TYLENOL Take 650 mg by mouth every 8 (eight) hours as needed for pain.   amiodarone 400 MG tablet Commonly known as:  PACERONE Take 1 tablet (400 mg total) by mouth 2 (two) times daily.   amLODipine 10 MG tablet Commonly known as:  NORVASC Take 10 mg by mouth at bedtime.   aspirin EC 81 MG tablet Take 81 mg by mouth at bedtime.   calcium acetate 667 MG capsule Commonly known as:  PHOSLO Take 667-1,334 mg by mouth See admin instructions. Take 2 capsules (1334 mg) by mouth 3 times daily with meals and 1 capsule (667 mg) with snacks   cinacalcet 60 MG tablet Commonly known as:  SENSIPAR Take 60 mg  by mouth daily. Monday, Wednesday and Friday   cloNIDine 0.2 MG tablet Commonly known as:  CATAPRES Take 0.2 mg by mouth 3 (three) times daily.   cyclobenzaprine 5 MG tablet Commonly known as:  FLEXERIL Take 5 mg by mouth 3 (three) times daily as needed for muscle spasms.   diazepam 5 MG tablet Commonly known as:  VALIUM Take 5 mg by mouth every 6 (six) hours as needed for anxiety.   esomeprazole 40 MG capsule Commonly known as:  NEXIUM Take 40 mg by mouth at bedtime.   ethyl chloride spray Apply 1 application topically daily as needed (dialysis). For use at dialysis   famotidine 20 MG tablet Commonly known as:  PEPCID Take 1  tablet (20 mg total) by mouth at bedtime. Replaces:  ranitidine 75 MG tablet   feeding supplement (NEPRO CARB STEADY) Liqd Take 237 mLs by mouth 2 (two) times daily between meals.   gabapentin 100 MG capsule Commonly known as:  NEURONTIN Take 1 capsule (100 mg total) by mouth 3 (three) times daily.   hydrALAZINE 50 MG tablet Commonly known as:  APRESOLINE Take 50 mg by mouth 3 (three) times daily.   HYDROcodone-acetaminophen 5-325 MG tablet Commonly known as:  NORCO Take 1 tablet by mouth every 4 (four) hours as needed for moderate pain. What changed:  when to take this   insulin glargine 100 unit/mL Sopn Commonly known as:  LANTUS Inject 0.15 mLs (15 Units total) into the skin at bedtime.   isosorbide mononitrate 30 MG 24 hr tablet Commonly known as:  IMDUR Take 1 tablet (30 mg total) by mouth daily at 12 noon. What changed:  how much to take   ondansetron 4 MG tablet Commonly known as:  ZOFRAN Take 1 tablet (4 mg total) by mouth every 8 (eight) hours as needed for nausea or vomiting.   OXYGEN Inhale into the lungs as needed (shortness of breath).   PEN NEEDLES 29GX1/2" 29G X Misc 15 Units by Does not apply route at bedtime.   RENAL-VITE 0.8 MG Tabs Take 1 tablet by mouth every evening. Vitamin B complex-vitamin C folic acid   Travoprost (BAK Free) 0.004 % Soln ophthalmic solution Commonly known as:  TRAVATAN Place 1 drop into both eyes at bedtime.            Durable Medical Equipment        Start     Ordered   08/01/17 1542  For home use only DME oxygen  Once    Comments:  SATURATION QUALIFICATIONS: (This note is used to comply with regulatory documentation for home oxygen) Patient Saturations on Room Air at Rest = 92-95% Patient Saturations on Room Air while Ambulating = Unable to ambulate  Patient Saturations on Room Air while Standing = 73% Patient Saturations on 3 Liters of oxygen while Standing = >90% Please briefly explain why patient needs  home oxygen:Pt failed alternative measures without O2.  Needs supplemental O2  Patient instructed to use 3 L O2 via [place]. Titrate to maintain SPO2> 90%   Provide Inogen O2 concentrator  Question Answer Comment  Mode or (Route) Nasal cannula   Frequency Continuous (stationary and portable oxygen unit needed)   Oxygen conserving device Yes   Oxygen delivery system Gas      08/01/17 1545       Discharge Care Instructions        Start     Ordered   08/02/17 0000  isosorbide mononitrate (IMDUR) 30 MG  24 hr tablet  Daily     08/01/17 1555   08/01/17 0000  famotidine (PEPCID) 20 MG tablet  Daily at bedtime     08/01/17 1555   08/01/17 0000  amiodarone (PACERONE) 400 MG tablet  2 times daily     08/01/17 1555   08/01/17 0000  insulin glargine (LANTUS) 100 unit/mL SOPN  Daily at bedtime     08/01/17 1555   08/01/17 0000  Insulin Pen Needle (PEN NEEDLES 29GX1/2") 29G X MISC  Daily at bedtime     08/01/17 1555     Allergies  Allergen Reactions  . Iodinated Diagnostic Agents Hives and Itching  . Lactose Intolerance (Gi) Other (See Comments)    Abdominal pains and bloating    Contact information for follow-up providers    Zoila Shutter, MD. Go on 08/08/2017.   Specialty:  Internal Medicine Why:  Please follow-up with Dr. Coralee Pesa on September 14th, 2018 at 10:30am for pericardial effusion, cardiogenic shock, new onset A. fib Contact information: [address] [place] [address] [phone]        Jake Bathe, MD. Go on 08/12/2017.   Specialty:  Cardiology Why:  Please follow-up with Herma Carson, PA on September 18th, 2018 at 12:15pm for pericardial effusion, Cardiac Tamponade, and new onset atrial fibrillation. Contact information: [address] [place] [address] [phone]            Contact information for after-discharge care    Destination    [facility] Follow up.   Specialty:   Skilled Nursing Facility Contact information: [address] [place] [address] [phone]                   The results of significant diagnostics from this hospitalization (including imaging, microbiology, ancillary and laboratory) are listed below for reference.    Significant Diagnostic Studies: Ct Abdomen Pelvis Wo Contrast  Result Date: 07/26/2017 CLINICAL DATA:  Mid abdominal pain with nausea and vomiting. End-stage renal disease, dialysis dependent. EXAM: CT ABDOMEN AND PELVIS WITHOUT CONTRAST TECHNIQUE: Multidetector CT imaging of the abdomen and pelvis was performed following the standard protocol without IV contrast. COMPARISON:  07/26/2017 chest x-ray 07/30/2015 CT without contrast FINDINGS: Lower chest: Large pericardial effusion. Small pleural effusions, slightly larger on the left. Aorta is atherosclerotic. Streaky bibasilar atelectasis. Degenerative changes of the lower thoracic spine. Hepatobiliary: Limited assessment without IV contrast. Diffuse periportal edema evident with small amount of perihepatic ascites on the right. Similar pattern of scattered small indeterminate hepatic hypodensities all measuring 10 mm or less in size, suspect small scattered hepatic cysts. Gallbladder demonstrates wall thickening and pericholecystic fluid. No surrounding inflammation. However, difficult to exclude cholecystitis. Consider correlation with ultrasound. No gross biliary obstruction. Gallbladder findings can also be seen with chronic hepatic disease. Pancreas: Limited assess without contrast. No large focal abnormality or ductal dilatation. No surrounding fluid collection or inflammation. Spleen: Normal in size without focal abnormality. Adrenals/Urinary Tract: Normal adrenal glands for age. Kidneys demonstrate chronic perinephric strandy edema. Nonobstructing intrarenal calculi bilaterally in the lower poles. Renal vascular calcifications also evident. No acute  obstructive uropathy or hydronephrosis. Negative for hydroureter or ureteral obstruction. Urinary bladder collapsed. Stomach/Bowel: Negative for bowel obstruction, significant dilatation, ileus, or free air. Scattered colonic diverticulosis. Appendix is unremarkable. No fluid collection or abscess. Moderate stool burden throughout the colon. Vascular/Lymphatic: Extensive calcific atherosclerosis of the aorta and iliac vessels. No retroperitoneal hemorrhage or  hematoma. Left femoral access graft noted. On the right, the right internal iliac artery continues as a persistent right sciatic artery, normal arterial variant anatomy. Reproductive: Remote hysterectomy. No adnexal mass or abnormality. No fluid collection or abscess. Other: No inguinal or abdominal wall hernia. Musculoskeletal: Degenerative changes of the spine SI joints. Vacuum disc phenomena at L4-5 and L5-S1. No acute osseous finding. IMPRESSION: Large pericardial effusion. Small pleural effusions with bibasilar atelectasis Periportal hepatic edema and small amount of perihepatic ascites with probable scattered small hepatic cysts. No biliary obstruction. Gallbladder wall thickening suspected with pericholecystic fluid. Difficult to exclude cholecystitis. Consider ultrasound correlation. Gallbladder findings can also be seen with chronic hepatic disease. Nonobstructing nephrolithiasis Extensive aortoiliac atherosclerosis Persistent right sciatic artery noted, normal vascular variant. Electronically Signed   By: Judie Petit.  Shick M.D.   On: 07/26/2017 09:28   Ct Head Wo Contrast  Result Date: 07/26/2017 CLINICAL DATA:  71 year old female with right side deficit. EXAM: CT HEAD WITHOUT CONTRAST TECHNIQUE: Contiguous axial images were obtained from the base of the skull through the vertex without intravenous contrast. COMPARISON:  CT head 03/25/2017 and earlier. FINDINGS: Brain: No acute intracranial hemorrhage identified. No midline shift, mass effect, or evidence  of intracranial mass lesion. No ventriculomegaly. Patchy bilateral white matter hypodensity, most pronounced about the left frontal horn, appears stable. No cortically based acute infarct identified. Vascular: Calcified atherosclerosis at the skull base. No suspicious intracranial vascular hyperdensity. Skull: Chronic hyperostosis of the skull. No acute osseous abnormality identified. Sinuses/Orbits: Visualized paranasal sinuses and mastoids are stable and well pneumatized. Other: No acute orbit, scalp, or visible face soft tissue abnormality. ASPECTS [facility] Stroke Program Early CT Score) - Ganglionic level infarction (caudate, lentiform nuclei, internal capsule, insula, M1-M3 cortex): 7 - Supraganglionic infarction (M4-M6 cortex): 3 Total score (0-10 with 10 being normal): 10 IMPRESSION: 1. Chronic white matter disease. No acute cortically based infarct or intracranial hemorrhage identified. 2. ASPECTS is 10. 3. The above was relayed via text pager to Dr. Arther Dames on 07/26/2017 at 21:51 . Electronically Signed   By: Odessa Fleming M.D.   On: 07/26/2017 21:51   Mr Maxine Glenn Head Wo Contrast  Result Date: 07/26/2017 CLINICAL DATA:  71 y/o  F; code stroke with right-sided deficits. EXAM: MRI HEAD WITHOUT CONTRAST MRA HEAD WITHOUT CONTRAST TECHNIQUE: Multiplanar, multiecho pulse sequences of the brain and surrounding structures were obtained without intravenous contrast. Angiographic images of the head were obtained using MRA technique without contrast. COMPARISON:  07/26/2017 CT of the head. FINDINGS: MRI HEAD FINDINGS Brain: No acute infarction, hemorrhage, hydrocephalus, extra-axial collection or mass lesion. Few nonspecific foci of T2 FLAIR hyperintense signal abnormality in subcortical and periventricular white matter is compatible with mild chronic microvascular ischemic changes for age. Moderate brain parenchymal volume loss. Vascular: As below. Skull and upper cervical spine: Normal marrow signal. Sinuses/Orbits:  Trace mastoid effusions. Small left maxillary sinus mucous retention cyst. Bilateral intra-ocular lens replacement. Other: None. MRA HEAD FINDINGS Internal carotid arteries:  Patent. Anterior cerebral arteries:  Patent. Middle cerebral arteries: Patent. Anterior communicating artery: Patent. Posterior communicating arteries:  Patent. Posterior cerebral arteries:  Patent. Basilar artery:  Patent. Vertebral arteries:  Patent. No evidence of high-grade stenosis, large vessel occlusion, or aneurysm. IMPRESSION: MRI head: 1. No acute intracranial abnormality identified. 2. Mild for age chronic microvascular ischemic changes and moderate parenchymal volume loss of the brain. 3. Trace mastoid effusions. MRA head: Negative MRA of the head. No large vessel occlusion, aneurysm, or significant stenosis. Electronically Signed  By: Mitzi Hansen M.D.   On: 07/26/2017 22:56   Mr Brain Wo Contrast  Result Date: 07/26/2017 CLINICAL DATA:  71 y/o  F; code stroke with right-sided deficits. EXAM: MRI HEAD WITHOUT CONTRAST MRA HEAD WITHOUT CONTRAST TECHNIQUE: Multiplanar, multiecho pulse sequences of the brain and surrounding structures were obtained without intravenous contrast. Angiographic images of the head were obtained using MRA technique without contrast. COMPARISON:  07/26/2017 CT of the head. FINDINGS: MRI HEAD FINDINGS Brain: No acute infarction, hemorrhage, hydrocephalus, extra-axial collection or mass lesion. Few nonspecific foci of T2 FLAIR hyperintense signal abnormality in subcortical and periventricular white matter is compatible with mild chronic microvascular ischemic changes for age. Moderate brain parenchymal volume loss. Vascular: As below. Skull and upper cervical spine: Normal marrow signal. Sinuses/Orbits: Trace mastoid effusions. Small left maxillary sinus mucous retention cyst. Bilateral intra-ocular lens replacement. Other: None. MRA HEAD FINDINGS Internal carotid arteries:  Patent. Anterior  cerebral arteries:  Patent. Middle cerebral arteries: Patent. Anterior communicating artery: Patent. Posterior communicating arteries:  Patent. Posterior cerebral arteries:  Patent. Basilar artery:  Patent. Vertebral arteries:  Patent. No evidence of high-grade stenosis, large vessel occlusion, or aneurysm. IMPRESSION: MRI head: 1. No acute intracranial abnormality identified. 2. Mild for age chronic microvascular ischemic changes and moderate parenchymal volume loss of the brain. 3. Trace mastoid effusions. MRA head: Negative MRA of the head. No large vessel occlusion, aneurysm, or significant stenosis. Electronically Signed   By: Mitzi Hansen M.D.   On: 07/26/2017 22:56   Dg Chest Port 1 View  Result Date: 07/26/2017 CLINICAL DATA:  RIGHT chest pain. Dialysis patient. Pt is a dialysis pt who is supposed to have dialysis tomorrow. Pt reports that her dialysis port is bleeding and it was not bleeding after dialysis Wednesday, states entire right side is hurting, right chest area. Hx of CAD. Diabetes. HTN, PAD, DM2 EXAM: PORTABLE CHEST 1 VIEW COMPARISON:  06/29/2017 FINDINGS: Cardiac silhouette is enlarged. Silhouette is enlarged compared to 06/29/2017. No effusion or infiltrate. No pulmonary edema. No pneumothorax for IMPRESSION: Increase cardiac silhouette compared to 06/29/2017. Differential includes cardiomegaly versus pericardial effusion. Electronically Signed   By: Genevive Bi M.D.   On: 07/26/2017 08:37   US Abdomen Limited Ruq  Result Date: 07/27/2017 CLINICAL DATA:  71 year old who presented yesterday with mid abdominal pain, nausea and vomiting. CT yesterday questioned gallbladder wall thickening. Ultrasound requested to evaluate for possible cholecystitis EXAM: ULTRASOUND ABDOMEN LIMITED RIGHT UPPER QUADRANT COMPARISON:  CT abdomen and pelvis yesterday.  No prior ultrasound. FINDINGS: Gallbladder: No evidence of gallbladder wall thickening as questioned on yesterday's CT. No  shadowing gallstones or gallbladder sludge. Minimal free fluid surrounding the gallbladder. Negative sonographic Murphy's sign according to the ultrasound technologist. Common bile duct: Diameter: Approximately 2 mm. Liver: Coarsened echotexture diffusely without focal hepatic parenchymal abnormality. Portal vein is patent on color Doppler imaging with normal direction of blood flow towards the liver. IMPRESSION: 1. No sonographic evidence of cholecystitis. No evidence of gallbladder wall thickening as questioned on yesterday's CT. 2. Coarsened hepatic echotexture indicating steatosis and/or hepatocellular disease. No focal hepatic parenchymal abnormality. Electronically Signed   By: Hulan Saas M.D.   On: 07/27/2017 09:32    Microbiology: Recent Results (from the past 240 hour(s))  Culture, blood (Routine X 2) w Reflex to ID Panel     Status: None   Collection Time: 07/26/17  8:18 AM  Result Value Ref Range Status   Specimen Description BLOOD LEFT HAND  Final   Special Requests  Final    BOTTLES DRAWN AEROBIC ONLY Blood Culture adequate volume   Culture NO GROWTH 5 DAYS  Final   Report Status 07/31/2017 FINAL  Final  Culture, blood (Routine X 2) w Reflex to ID Panel     Status: None   Collection Time: 07/26/17  9:30 AM  Result Value Ref Range Status   Specimen Description BLOOD LEFT HAND  Final   Special Requests   Final    BOTTLES DRAWN AEROBIC AND ANAEROBIC Blood Culture adequate volume   Culture NO GROWTH 5 DAYS  Final   Report Status 07/31/2017 FINAL  Final  Culture, body fluid-bottle     Status: None   Collection Time: 07/26/17 11:13 AM  Result Value Ref Range Status   Specimen Description FLUID PERICARDIAL  Final   Special Requests NONE  Final   Culture NO GROWTH 5 DAYS  Final   Report Status 07/31/2017 FINAL  Final  Gram stain     Status: None   Collection Time: 07/26/17 11:13 AM  Result Value Ref Range Status   Specimen Description FLUID PERICARDIAL  Final   Special  Requests NONE  Final   Gram Stain   Final    FEW WBC PRESENT, PREDOMINANTLY PMN NO ORGANISMS SEEN    Report Status 07/26/2017 FINAL  Final  MRSA PCR Screening     Status: Abnormal   Collection Time: 07/26/17 12:35 PM  Result Value Ref Range Status   MRSA by PCR INVALID RESULTS, SPECIMEN SENT FOR CULTURE (A) NEGATIVE Final    Comment: RESULT CALLED TO, READ BACK BY AND VERIFIED WITH: S HINTZ,RN AT 1736 07/26/17 BY L BENFIELD        The GeneXpert MRSA Assay (FDA approved for NASAL specimens only), is one component of a comprehensive MRSA colonization surveillance program. It is not intended to diagnose MRSA infection nor to guide or monitor treatment for MRSA infections.   MRSA culture     Status: None   Collection Time: 07/26/17 12:35 PM  Result Value Ref Range Status   Specimen Description NASAL SWAB  Final   Special Requests NONE  Final   Culture NO MRSA DETECTED  Final   Report Status 07/28/2017 FINAL  Final  Culture, blood (routine x 2)     Status: None   Collection Time: 07/26/17  2:11 PM  Result Value Ref Range Status   Specimen Description BLOOD LEFT ARM  Final   Special Requests IN PEDIATRIC BOTTLE Blood Culture adequate volume  Final   Culture NO GROWTH 5 DAYS  Final   Report Status 07/31/2017 FINAL  Final  Culture, blood (routine x 2)     Status: None   Collection Time: 07/26/17  2:11 PM  Result Value Ref Range Status   Specimen Description BLOOD LEFT HAND  Final   Special Requests IN PEDIATRIC BOTTLE Blood Culture adequate volume  Final   Culture NO GROWTH 5 DAYS  Final   Report Status 07/31/2017 FINAL  Final     Labs: Basic Metabolic Panel:  Recent Labs Lab 07/28/17 0210 07/30/17 0800 07/30/17 1403 07/31/17 0821 08/01/17 0939  NA 134* 128* 133* 131* 128*  K 4.2 4.3 3.9 4.2 4.9  CL 94* 88* 90* 90* 90*  CO2 GLUCOSE 166* 113* 177* 173* 247*  BUN 67* 38* 13 29* 45*  CREATININE 7.48* 5.35* 3.02* 4.44* 6.34*  CALCIUM 8.4* 8.1* 8.8*  8.7* 9.1  MG  --   --   --  1.8  --   PHOS  --  3.7 3.0  --  4.5   Liver Function Tests:  Recent Labs Lab 07/26/17 0818 07/28/17 0210 07/30/17 0800 07/30/17 1403 08/01/17 0939  AST 42* 22  --   --   --   ALT 25 27  --   --   --   ALKPHOS 132* 135*  --   --   --   BILITOT 0.6 0.6  --   --   --   PROT 6.2* 6.0*  --   --   --   ALBUMIN 2.9* 2.7* 2.4* 2.9* 2.7*    Recent Labs Lab 07/26/17 0818  LIPASE 24   No results for input(s): AMMONIA in the last 168 hours. CBC:  Recent Labs Lab 07/26/17 0818 07/26/17 1411 07/26/17 2112 07/27/17 0339 07/28/17 0210 07/30/17 0412 08/01/17 0939  WBC 11.4* 12.4*  --  10.6* 10.2 7.7 13.8*  NEUTROABS 8.6*  --   --   --   --   --   --   HGB 9.3* 9.7* 10.2* 8.2* 9.2* 9.5* 9.5*  HCT 30.2* 30.5* 30.0* 26.1* 29.1* 29.5* 30.4*  MCV 88.0 86.4  --  85.0 86.1 84.0 84.7  PLT 533* 497*  --  499* 535* 476* 512*   Cardiac Enzymes: No results for input(s): CKTOTAL, CKMB, CKMBINDEX, TROPONINI in the last 168 hours. BNP: BNP (last 3 results) No results for input(s): BNP in the last 8760 hours.  ProBNP (last 3 results) No results for input(s): PROBNP in the last 8760 hours.  CBG:  Recent Labs Lab 07/31/17 1206 07/31/17 1705 07/31/17 2113 08/01/17 0830 08/01/17 1508  GLUCAP 185* 187* 136* 168* 169*       Signed:  Carolyne Littles, MD [facility] [phone] pager

## 2017-08-01 NOTE — Plan of Care (Signed)
Problem: Bowel/Gastric: Goal: Will not experience complications related to bowel motility Outcome: Not Progressing Has not had stool in 72 hours  Problem: Education: Goal: Ability to describe self-care measures that may prevent or decrease complications (Diabetes Survival Skills Education) will improve Outcome: Progressing Will be going to SNF

## 2017-08-05 ENCOUNTER — Non-Acute Institutional Stay (SKILLED_NURSING_FACILITY): Payer: Medicare HMO | Admitting: Internal Medicine

## 2017-08-05 ENCOUNTER — Encounter: Payer: Self-pay | Admitting: Internal Medicine

## 2017-08-05 DIAGNOSIS — E1122 Type 2 diabetes mellitus with diabetic chronic kidney disease: Secondary | ICD-10-CM

## 2017-08-05 DIAGNOSIS — N186 End stage renal disease: Secondary | ICD-10-CM

## 2017-08-05 DIAGNOSIS — I314 Cardiac tamponade: Secondary | ICD-10-CM | POA: Diagnosis not present

## 2017-08-05 DIAGNOSIS — R531 Weakness: Secondary | ICD-10-CM | POA: Diagnosis not present

## 2017-08-05 DIAGNOSIS — I4891 Unspecified atrial fibrillation: Secondary | ICD-10-CM | POA: Diagnosis not present

## 2017-08-05 DIAGNOSIS — Z992 Dependence on renal dialysis: Secondary | ICD-10-CM | POA: Diagnosis not present

## 2017-08-05 DIAGNOSIS — Z9189 Other specified personal risk factors, not elsewhere classified: Secondary | ICD-10-CM | POA: Diagnosis not present

## 2017-08-05 NOTE — Assessment & Plan Note (Addendum)
Continue present dialysis schedule Contact Dr. Briant CedarMattingly concerning appropriate gabapentin dose with  HD

## 2017-08-05 NOTE — Assessment & Plan Note (Addendum)
Continue amiodarone Anticoagulation not an option due to risk of hemorrhagic pericardial effusion and recurrence of  Tamponade Rate controlled

## 2017-08-05 NOTE — Assessment & Plan Note (Addendum)
Change Norco to once each morning and continue the Tylenol every 6 hours prn. This will  address the risk of potential acetaminophen excess also.

## 2017-08-05 NOTE — Patient Instructions (Signed)
See assessment and plan under each diagnosis in the problem list and acutely for this visit 

## 2017-08-05 NOTE — Assessment & Plan Note (Addendum)
Issues are chronic and no further evaluation is needed unless weakness RUE progressive. PT/OT at SNF.

## 2017-08-05 NOTE — Assessment & Plan Note (Addendum)
Decrease Lantus to 12 units each morning as fasting glucoses range 208-244 and increase NovoLog by 2 units before each meal.

## 2017-08-05 NOTE — Assessment & Plan Note (Addendum)
Resolved post pericardiocentesis 07/26/17 emergently
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
## 2017-08-06 ENCOUNTER — Emergency Department (HOSPITAL_COMMUNITY): Payer: Medicare HMO

## 2017-08-06 ENCOUNTER — Encounter (HOSPITAL_COMMUNITY): Payer: Self-pay | Admitting: Emergency Medicine

## 2017-08-06 ENCOUNTER — Inpatient Hospital Stay (HOSPITAL_COMMUNITY)
Admission: EM | Admit: 2017-08-06 | Discharge: 2017-08-12 | DRG: 308 | Disposition: A | Discharge status: Skilled Nursing Facility | Payer: Medicare HMO | Attending: Internal Medicine | Admitting: Internal Medicine

## 2017-08-06 DIAGNOSIS — G8929 Other chronic pain: Secondary | ICD-10-CM | POA: Diagnosis present

## 2017-08-06 DIAGNOSIS — J189 Pneumonia, unspecified organism: Secondary | ICD-10-CM | POA: Diagnosis present

## 2017-08-06 DIAGNOSIS — Z8249 Family history of ischemic heart disease and other diseases of the circulatory system: Secondary | ICD-10-CM

## 2017-08-06 DIAGNOSIS — Z87891 Personal history of nicotine dependence: Secondary | ICD-10-CM

## 2017-08-06 DIAGNOSIS — I110 Hypertensive heart disease with heart failure: Secondary | ICD-10-CM | POA: Diagnosis not present

## 2017-08-06 DIAGNOSIS — E861 Hypovolemia: Secondary | ICD-10-CM | POA: Diagnosis present

## 2017-08-06 DIAGNOSIS — E1122 Type 2 diabetes mellitus with diabetic chronic kidney disease: Secondary | ICD-10-CM | POA: Diagnosis present

## 2017-08-06 DIAGNOSIS — Z6826 Body mass index (BMI) 26.0-26.9, adult: Secondary | ICD-10-CM

## 2017-08-06 DIAGNOSIS — I471 Supraventricular tachycardia: Secondary | ICD-10-CM | POA: Diagnosis present

## 2017-08-06 DIAGNOSIS — E8889 Other specified metabolic disorders: Secondary | ICD-10-CM | POA: Diagnosis present

## 2017-08-06 DIAGNOSIS — Z79899 Other long term (current) drug therapy: Secondary | ICD-10-CM

## 2017-08-06 DIAGNOSIS — E11319 Type 2 diabetes mellitus with unspecified diabetic retinopathy without macular edema: Secondary | ICD-10-CM | POA: Diagnosis present

## 2017-08-06 DIAGNOSIS — H409 Unspecified glaucoma: Secondary | ICD-10-CM | POA: Diagnosis present

## 2017-08-06 DIAGNOSIS — R011 Cardiac murmur, unspecified: Secondary | ICD-10-CM | POA: Diagnosis present

## 2017-08-06 DIAGNOSIS — R748 Abnormal levels of other serum enzymes: Secondary | ICD-10-CM | POA: Diagnosis not present

## 2017-08-06 DIAGNOSIS — Z825 Family history of asthma and other chronic lower respiratory diseases: Secondary | ICD-10-CM

## 2017-08-06 DIAGNOSIS — N186 End stage renal disease: Secondary | ICD-10-CM | POA: Diagnosis present

## 2017-08-06 DIAGNOSIS — I313 Pericardial effusion (noninflammatory): Secondary | ICD-10-CM | POA: Diagnosis present

## 2017-08-06 DIAGNOSIS — R079 Chest pain, unspecified: Secondary | ICD-10-CM | POA: Diagnosis present

## 2017-08-06 DIAGNOSIS — Z992 Dependence on renal dialysis: Secondary | ICD-10-CM | POA: Diagnosis not present

## 2017-08-06 DIAGNOSIS — R7989 Other specified abnormal findings of blood chemistry: Secondary | ICD-10-CM

## 2017-08-06 DIAGNOSIS — E1140 Type 2 diabetes mellitus with diabetic neuropathy, unspecified: Secondary | ICD-10-CM | POA: Diagnosis present

## 2017-08-06 DIAGNOSIS — R509 Fever, unspecified: Secondary | ICD-10-CM | POA: Diagnosis not present

## 2017-08-06 DIAGNOSIS — I739 Peripheral vascular disease, unspecified: Secondary | ICD-10-CM | POA: Diagnosis not present

## 2017-08-06 DIAGNOSIS — E8770 Fluid overload, unspecified: Secondary | ICD-10-CM | POA: Diagnosis present

## 2017-08-06 DIAGNOSIS — Z9981 Dependence on supplemental oxygen: Secondary | ICD-10-CM

## 2017-08-06 DIAGNOSIS — I1311 Hypertensive heart and chronic kidney disease without heart failure, with stage 5 chronic kidney disease, or end stage renal disease: Secondary | ICD-10-CM | POA: Diagnosis present

## 2017-08-06 DIAGNOSIS — E739 Lactose intolerance, unspecified: Secondary | ICD-10-CM | POA: Diagnosis present

## 2017-08-06 DIAGNOSIS — I4891 Unspecified atrial fibrillation: Secondary | ICD-10-CM | POA: Diagnosis not present

## 2017-08-06 DIAGNOSIS — I2510 Atherosclerotic heart disease of native coronary artery without angina pectoris: Secondary | ICD-10-CM | POA: Diagnosis present

## 2017-08-06 DIAGNOSIS — Z833 Family history of diabetes mellitus: Secondary | ICD-10-CM

## 2017-08-06 DIAGNOSIS — I309 Acute pericarditis, unspecified: Secondary | ICD-10-CM | POA: Diagnosis not present

## 2017-08-06 DIAGNOSIS — I480 Paroxysmal atrial fibrillation: Principal | ICD-10-CM

## 2017-08-06 DIAGNOSIS — E1129 Type 2 diabetes mellitus with other diabetic kidney complication: Secondary | ICD-10-CM | POA: Diagnosis present

## 2017-08-06 DIAGNOSIS — R778 Other specified abnormalities of plasma proteins: Secondary | ICD-10-CM

## 2017-08-06 DIAGNOSIS — R0789 Other chest pain: Secondary | ICD-10-CM | POA: Diagnosis not present

## 2017-08-06 DIAGNOSIS — K219 Gastro-esophageal reflux disease without esophagitis: Secondary | ICD-10-CM | POA: Diagnosis not present

## 2017-08-06 DIAGNOSIS — M1990 Unspecified osteoarthritis, unspecified site: Secondary | ICD-10-CM | POA: Diagnosis present

## 2017-08-06 DIAGNOSIS — R1013 Epigastric pain: Secondary | ICD-10-CM | POA: Diagnosis not present

## 2017-08-06 DIAGNOSIS — I5032 Chronic diastolic (congestive) heart failure: Secondary | ICD-10-CM | POA: Diagnosis not present

## 2017-08-06 DIAGNOSIS — Z7982 Long term (current) use of aspirin: Secondary | ICD-10-CM

## 2017-08-06 DIAGNOSIS — E1151 Type 2 diabetes mellitus with diabetic peripheral angiopathy without gangrene: Secondary | ICD-10-CM | POA: Diagnosis present

## 2017-08-06 DIAGNOSIS — Z90710 Acquired absence of both cervix and uterus: Secondary | ICD-10-CM

## 2017-08-06 DIAGNOSIS — Z91041 Radiographic dye allergy status: Secondary | ICD-10-CM

## 2017-08-06 DIAGNOSIS — N2581 Secondary hyperparathyroidism of renal origin: Secondary | ICD-10-CM | POA: Diagnosis present

## 2017-08-06 DIAGNOSIS — E785 Hyperlipidemia, unspecified: Secondary | ICD-10-CM | POA: Diagnosis present

## 2017-08-06 DIAGNOSIS — R109 Unspecified abdominal pain: Secondary | ICD-10-CM | POA: Diagnosis not present

## 2017-08-06 DIAGNOSIS — D631 Anemia in chronic kidney disease: Secondary | ICD-10-CM | POA: Diagnosis present

## 2017-08-06 DIAGNOSIS — R0600 Dyspnea, unspecified: Secondary | ICD-10-CM

## 2017-08-06 DIAGNOSIS — E669 Obesity, unspecified: Secondary | ICD-10-CM | POA: Diagnosis present

## 2017-08-06 DIAGNOSIS — H3530 Unspecified macular degeneration: Secondary | ICD-10-CM | POA: Diagnosis present

## 2017-08-06 DIAGNOSIS — Z794 Long term (current) use of insulin: Secondary | ICD-10-CM

## 2017-08-06 DIAGNOSIS — I3139 Other pericardial effusion (noninflammatory): Secondary | ICD-10-CM | POA: Diagnosis present

## 2017-08-06 HISTORY — DX: Unspecified atrial fibrillation: I48.91

## 2017-08-06 LAB — BASIC METABOLIC PANEL
Anion gap: 13 (ref 5–15)
BUN: 23 mg/dL — ABNORMAL HIGH (ref 6–20)
CO2: 29 mmol/L (ref 22–32)
Calcium: 9.2 mg/dL (ref 8.9–10.3)
Chloride: 91 mmol/L — ABNORMAL LOW (ref 101–111)
Creatinine, Ser: 4.43 mg/dL — ABNORMAL HIGH (ref 0.44–1.00)
GFR calc Af Amer: 11 mL/min — ABNORMAL LOW (ref >60)
GFR calc non Af Amer: 9 mL/min — ABNORMAL LOW (ref >60)
Glucose, Bld: 261 mg/dL — ABNORMAL HIGH (ref 65–99)
Potassium: 4.2 mmol/L (ref 3.5–5.1)
Sodium: 133 mmol/L — ABNORMAL LOW (ref 135–145)

## 2017-08-06 LAB — CBC
HCT: 31 % — ABNORMAL LOW (ref 36.0–46.0)
Hemoglobin: 9.7 g/dL — ABNORMAL LOW (ref 12.0–15.0)
MCH: 26.8 pg (ref 26.0–34.0)
MCHC: 31.3 g/dL (ref 30.0–36.0)
MCV: 85.6 fL (ref 78.0–100.0)
Platelets: 593 10*3/uL — ABNORMAL HIGH (ref 150–400)
RBC: 3.62 MIL/uL — ABNORMAL LOW (ref 3.87–5.11)
RDW: 15.9 % — ABNORMAL HIGH (ref 11.5–15.5)
WBC: 15.3 10*3/uL — ABNORMAL HIGH (ref 4.0–10.5)

## 2017-08-06 LAB — I-STAT TROPONIN, ED
Troponin i, poc: 0.18 ng/mL (ref 0.00–0.08)
Troponin i, poc: 0.16 ng/mL (ref 0.00–0.08)

## 2017-08-06 LAB — ECHOCARDIOGRAM LIMITED
Height: 66 in
Weight: 2582.03 oz

## 2017-08-06 LAB — MAGNESIUM: MAGNESIUM: 2.1 mg/dL (ref 1.7–2.4)

## 2017-08-06 LAB — GLUCOSE, CAPILLARY: Glucose-Capillary: 232 mg/dL — ABNORMAL HIGH (ref 65–99)

## 2017-08-06 LAB — PHOSPHORUS: PHOSPHORUS: 2.9 mg/dL (ref 2.5–4.6)

## 2017-08-06 MED ORDER — AMIODARONE HCL IN DEXTROSE 360-4.14 MG/200ML-% IV SOLN
30.0000 mg/h | INTRAVENOUS | Status: DC
  Administered 2017-08-07 (×2): 30 mg/h via INTRAVENOUS

## 2017-08-06 MED ORDER — ASPIRIN EC 81 MG PO TBEC
81.0000 mg | DELAYED_RELEASE_TABLET | Freq: Every day | ORAL | Status: DC
  Administered 2017-08-06 – 2017-08-11 (×6): 81 mg via ORAL
  Filled 2017-08-06 (×6): qty 1

## 2017-08-06 MED ORDER — AMIODARONE LOAD VIA INFUSION
150.0000 mg | Freq: Once | INTRAVENOUS | Status: AC
  Administered 2017-08-06: 150 mg via INTRAVENOUS
  Filled 2017-08-06: qty 83.34

## 2017-08-06 MED ORDER — GABAPENTIN 100 MG PO CAPS
100.0000 mg | ORAL_CAPSULE | Freq: Three times a day (TID) | ORAL | Status: DC
  Administered 2017-08-06 – 2017-08-12 (×16): 100 mg via ORAL
  Filled 2017-08-06 (×16): qty 1

## 2017-08-06 MED ORDER — AMIODARONE HCL IN DEXTROSE 360-4.14 MG/200ML-% IV SOLN
60.0000 mg/h | INTRAVENOUS | Status: AC
  Filled 2017-08-06 (×2): qty 200

## 2017-08-06 MED ORDER — AMIODARONE HCL IN DEXTROSE 360-4.14 MG/200ML-% IV SOLN
30.0000 mg/h | INTRAVENOUS | Status: DC
  Filled 2017-08-06 (×2): qty 200

## 2017-08-06 MED ORDER — SODIUM CHLORIDE 0.9 % IV BOLUS (SEPSIS)
1000.0000 mL | Freq: Once | INTRAVENOUS | Status: AC
  Administered 2017-08-06: 500 mL via INTRAVENOUS

## 2017-08-06 MED ORDER — ACETAMINOPHEN 325 MG PO TABS
650.0000 mg | ORAL_TABLET | Freq: Four times a day (QID) | ORAL | Status: DC | PRN
  Administered 2017-08-07 – 2017-08-10 (×2): 650 mg via ORAL
  Filled 2017-08-06 (×2): qty 2

## 2017-08-06 MED ORDER — CYCLOBENZAPRINE HCL 5 MG PO TABS
5.0000 mg | ORAL_TABLET | Freq: Three times a day (TID) | ORAL | Status: DC | PRN
  Administered 2017-08-08: 5 mg via ORAL
  Filled 2017-08-06: qty 1

## 2017-08-06 MED ORDER — SODIUM CHLORIDE 0.9 % IV BOLUS (SEPSIS)
500.0000 mL | Freq: Once | INTRAVENOUS | Status: AC
  Administered 2017-08-06: 500 mL via INTRAVENOUS

## 2017-08-06 MED ORDER — RENA-VITE PO TABS
1.0000 | ORAL_TABLET | Freq: Every day | ORAL | Status: DC
  Administered 2017-08-06 – 2017-08-11 (×6): 1 via ORAL
  Filled 2017-08-06 (×7): qty 1

## 2017-08-06 MED ORDER — AMIODARONE HCL IN DEXTROSE 360-4.14 MG/200ML-% IV SOLN
60.0000 mg/h | INTRAVENOUS | Status: AC
  Administered 2017-08-06: 60 mg/h via INTRAVENOUS

## 2017-08-06 MED ORDER — ONDANSETRON HCL 4 MG PO TABS: 4.0000 mg | ORAL_TABLET | Freq: Four times a day (QID) | ORAL | Status: DC | PRN

## 2017-08-06 MED ORDER — NEPRO/CARBSTEADY PO LIQD
237.0000 mL | Freq: Two times a day (BID) | ORAL | Status: DC
  Administered 2017-08-07 – 2017-08-12 (×7): 237 mL via ORAL
  Filled 2017-08-06 (×15): qty 237

## 2017-08-06 MED ORDER — FAMOTIDINE 20 MG PO TABS
20.0000 mg | ORAL_TABLET | Freq: Every day | ORAL | Status: DC
  Administered 2017-08-06 – 2017-08-11 (×6): 20 mg via ORAL
  Filled 2017-08-06 (×6): qty 1

## 2017-08-06 MED ORDER — ACETAMINOPHEN 650 MG RE SUPP: 650.0000 mg | Freq: Four times a day (QID) | RECTAL | Status: DC | PRN

## 2017-08-06 MED ORDER — HYDROCODONE-ACETAMINOPHEN 5-325 MG PO TABS
1.0000 | ORAL_TABLET | ORAL | Status: DC | PRN
  Administered 2017-08-07 – 2017-08-12 (×13): 1 via ORAL
  Filled 2017-08-06 (×12): qty 1

## 2017-08-06 MED ORDER — INSULIN ASPART 100 UNIT/ML ~~LOC~~ SOLN
0.0000 [IU] | Freq: Every day | SUBCUTANEOUS | Status: DC
  Administered 2017-08-06 – 2017-08-10 (×4): 2 [IU] via SUBCUTANEOUS

## 2017-08-06 MED ORDER — CINACALCET HCL 30 MG PO TABS
60.0000 mg | ORAL_TABLET | ORAL | Status: DC
  Administered 2017-08-08: 60 mg via ORAL
  Filled 2017-08-06: qty 2

## 2017-08-06 MED ORDER — PANTOPRAZOLE SODIUM 40 MG PO TBEC
80.0000 mg | DELAYED_RELEASE_TABLET | Freq: Every day | ORAL | Status: DC
  Administered 2017-08-07 – 2017-08-09 (×3): 80 mg via ORAL
  Filled 2017-08-06 (×3): qty 2

## 2017-08-06 MED ORDER — ONDANSETRON HCL 4 MG/2ML IJ SOLN
4.0000 mg | Freq: Four times a day (QID) | INTRAMUSCULAR | Status: DC | PRN
  Administered 2017-08-10: 4 mg via INTRAVENOUS
  Filled 2017-08-06 (×2): qty 2

## 2017-08-06 MED ORDER — CALCIUM ACETATE (PHOS BINDER) 667 MG PO CAPS
1334.0000 mg | ORAL_CAPSULE | Freq: Three times a day (TID) | ORAL | Status: DC
  Administered 2017-08-07 – 2017-08-12 (×15): 1334 mg via ORAL
  Filled 2017-08-06 (×16): qty 2

## 2017-08-06 MED ORDER — LATANOPROST 0.005 % OP SOLN
1.0000 [drp] | Freq: Every day | OPHTHALMIC | Status: DC
Start: 2017-08-06 — End: 2017-08-12
  Administered 2017-08-06 – 2017-08-11 (×6): 1 [drp] via OPHTHALMIC
  Filled 2017-08-06: qty 2.5

## 2017-08-06 MED ORDER — INSULIN ASPART 100 UNIT/ML ~~LOC~~ SOLN
0.0000 [IU] | Freq: Three times a day (TID) | SUBCUTANEOUS | Status: DC
  Administered 2017-08-07: 3 [IU] via SUBCUTANEOUS
  Administered 2017-08-07: 5 [IU] via SUBCUTANEOUS
  Administered 2017-08-07 – 2017-08-08 (×2): 3 [IU] via SUBCUTANEOUS
  Administered 2017-08-09: 1 [IU] via SUBCUTANEOUS
  Administered 2017-08-09: 3 [IU] via SUBCUTANEOUS
  Administered 2017-08-09: 1 [IU] via SUBCUTANEOUS
  Administered 2017-08-10 (×2): 2 [IU] via SUBCUTANEOUS
  Administered 2017-08-10 – 2017-08-11 (×2): 5 [IU] via SUBCUTANEOUS
  Administered 2017-08-12 (×2): 2 [IU] via SUBCUTANEOUS

## 2017-08-06 MED ORDER — FENTANYL CITRATE (PF) 100 MCG/2ML IJ SOLN
25.0000 ug | Freq: Once | INTRAMUSCULAR | Status: AC
  Administered 2017-08-06: 25 ug via INTRAVENOUS
  Filled 2017-08-06: qty 2

## 2017-08-06 MED ORDER — CALCIUM ACETATE (PHOS BINDER) 667 MG PO CAPS
667.0000 mg | ORAL_CAPSULE | ORAL | Status: DC | PRN
  Filled 2017-08-06: qty 1

## 2017-08-06 NOTE — ED Provider Notes (Signed)
[facility]-EMERGENCY DEPT Provider Note   CSN: [id number] Arrival date & time: 08/06/17  4098     History   Chief Complaint Chief Complaint  Patient presents with  . Chest Pain    HPI Colleen Mullins is a 71 y.o. female with history of ESRD on dialysis M/W/F, DM type II, CAD, GERD, HLD, HTN, PAD, PVD who presents today with chief complaint acute onset, constant substernal chest pain since this morning. She states that pain is 8/10 dull ache that does not radiate. Pain worsens with activity, and is alleviated with rest and somewhat laying on her back. She underwent dialysis for 1 hour today but had to stop due to elevated heart rate. While en route to the hospital, patient was found to be in A. fib with RVR HR 125-150 per EMS. She denies increased SOB, abdominal pain, fevers, chills, n/v/d. She was recently admitted with pericardial effusion on 07/26/2017 and discharge 08/01/2017. She underwent emergent pericardiocentesis by cardiology on September 1 with the drain removed on September 3. She states that her symptoms feel similarly in her chest, but she states that at the time that she presented with the pericardial effusion she had "pain all over my body ". She was found to be in new onset A. fib on presentation in the ED which resolved after pericardiocentesis but then returned after hemodialysis on September 3. She was begun on amlodipine 10 mg, amiodarone 400 mg twice a day, Imdur  daily, and anticoagulation was held due to risk for hemorrhagic pericardial effusion. When asked about her new medications today, pt states she is unsure what she is taking but that she takes all of her medications daily.  Echocardiogram done during her hospital stay shows normal left ventricular function. She was scheduled to follow up with Dr. Donato Schultz in cardiology 2 weeks s/p pericardiocentesis. She is a current smoker and states "a pack of cigarettes will last me about 3 weeks ". The history is provided by the  patient.    Past Medical History:  Diagnosis Date  . A-fib (HCC)   . Abnormal nuclear stress test 07/10/2016  . Anemia    low iron  . Anemia in chronic renal disease 07/08/2016  . Arthritis   . CAD in native artery 08/12/2016   a. Nonobstructive ASCAD by cath with 60% D1 and 50% PL off RCA and 25% mid LAD.  . Diabetes mellitus    type 2  . Diabetic retinopathy (HCC)   . Dialysis patient [facility])    M-W-F @ [facility]  . Diastolic dysfunction   . Dyspnea on exertion 04/14/2016  . Ejection fraction   . Elevated troponin 10/16/2015  . ESRD on hemodialysis [facility])    Dialysis T/Th/Sa  . GERD (gastroesophageal reflux disease)   . Glaucoma   . H/O: Bell's palsy 2007  . Hyperlipidemia   . Hypertension    a. labile - hx of BP dropping at HD.  . Macular degeneration   . Musculoskeletal chest pain   . PAD (peripheral artery disease) (HCC)    a. ABI 08/2016: mod RLE disease, normal L ABI.  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 PVD (peripheral vascular disease) (HCC) 03/18/2017  . Type 2 diabetes mellitus with renal complication (HCC) 09/26/2011    Patient Active Problem List   Diagnosis Date Noted  . Chest pain 08/06/2017  . Right sided weakness 08/05/2017  . At risk for adverse drug event 08/05/2017  . Cardiogenic shock (HCC)   . New  onset atrial fibrillation (HCC)   . Essential hypertension   . Chronic respiratory failure with hypoxia (HCC)   . Controlled type 2 diabetes mellitus with diabetic nephropathy (HCC)   . End-stage renal disease on hemodialysis (HCC)   . History of anemia due to chronic kidney disease   . Tamponade 07/26/2017  . Pericardial effusion   . Acute on chronic respiratory failure with hypoxia (HCC) 06/14/2017  . Acute on chronic diastolic (congestive) heart failure (HCC) 06/14/2017  . Chest wall pain 06/14/2017  . Aspiration pneumonia (HCC) 06/14/2017  . Right shoulder pain 06/14/2017  . GERD (gastroesophageal reflux disease) 06/14/2017    . PVD (peripheral vascular disease) (HCC) 03/18/2017  . CAD in native artery 08/12/2016  . Abnormal nuclear stress test 07/10/2016  . Musculoskeletal chest pain   . Anemia in chronic renal disease 07/08/2016  . Dyspnea on exertion 04/14/2016  . ESRD on dialysis (HCC) 04/14/2016  . Hypertensive heart disease with congestive heart failure (HCC) 04/14/2016  . Hyperlipidemia 04/14/2016  . 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 Hypoglycemia 10/16/2015  . Elevated troponin 10/16/2015  . Ejection fraction   . Type 2 diabetes mellitus with renal complication (HCC) 09/26/2011  . Other complications due to renal dialysis device, implant, and graft 09/26/2011    Past Surgical History:  Procedure Laterality Date  . ABDOMINAL HYSTERECTOMY    . ARTERIOVENOUS GRAFT PLACEMENT     Left forearm x 2, one removed in March  . ARTERIOVENOUS GRAFT PLACEMENT     Left forearm  . AVGG REMOVAL  10/08/2011   Procedure: REMOVAL OF ARTERIOVENOUS GORETEX GRAFT (AVGG);  Surgeon: Sherren Kerns, MD;  Location: [facility] OR;  Service: Vascular;  Laterality: Left;  . BTL    . CARDIAC CATHETERIZATION N/A 07/10/2016   Procedure: Left Heart Cath and Coronary Angiography;  Surgeon: Yvonne Kendall, MD;  Location: [facility] INVASIVE CV LAB;  Service: Cardiovascular;  Laterality: N/A;  . COLONOSCOPY    . EXCHANGE OF A DIALYSIS CATHETER Right 03/27/2016   Procedure: EXCHANGE OF A DIALYSIS CATHETER;  Surgeon: Fransisco Hertz, MD;  Location: [facility] OR;  Service: Vascular;  Laterality: Right;  . EYE SURGERY Left    Lasik surgery on left. Cataract surgery on both eyes  . LAPAROTOMY  ?2000   Pelvic mass (Benign)  . PERICARDIOCENTESIS N/A 07/26/2017   Procedure: PERICARDIOCENTESIS;  Surgeon: Corky Crafts, MD;  Location: [facility] INVASIVE CV LAB;  Service: Cardiovascular;  Laterality: N/A;  . REVISION OF ARTERIOVENOUS GORETEX GRAFT Left 03/27/2016   Procedure: REVISION OF LEFT ARTERIOVENOUS GORETEX GRAFT;  Surgeon: Fransisco Hertz,  MD;  Location: [facility] OR;  Service: Vascular;  Laterality: Left;  . TONSILLECTOMY      OB History    No data available       Home Medications    Prior to Admission medications   Medication Sig Start Date End Date Taking? Authorizing Provider  acetaminophen (TYLENOL) 650 MG CR tablet Take 650 mg by mouth every 8 (eight) hours as needed for pain.    Yes [provider]  amiodarone (PACERONE) 400 MG tablet Take 1 tablet (400 mg total) by mouth 2 (two) times daily. 08/01/17  Yes Drema Dallas, MD  amLODipine (NORVASC) 10 MG tablet Take 10 mg by mouth at bedtime.    Yes [provider]  aspirin EC 81 MG tablet Take 81 mg by mouth at bedtime.    Yes [provider]  B Complex-C-Folic Acid (RENAL-VITE) 0.8 MG TABS  Take 1 tablet by mouth every evening. Vitamin B complex-vitamin C folic acid   Yes [provider]  calcium acetate (PHOSLO) 667 MG capsule Take 667-1,334 mg by mouth See admin instructions. Take 2 capsules (1334 mg) by mouth 3 times daily with meals and 1 capsule (667 mg) with snacks   Yes [provider]  cinacalcet (SENSIPAR) 60 MG tablet Take 60 mg by mouth daily. Monday, Wednesday and Friday   Yes [provider]  cloNIDine (CATAPRES) 0.2 MG tablet Take 0.2 mg by mouth 3 (three) times daily.   Yes [provider]  cyclobenzaprine (FLEXERIL) 5 MG tablet Take 5 mg by mouth 3 (three) times daily as needed for muscle spasms.    Yes [provider]  esomeprazole (NEXIUM) 40 MG capsule Take 40 mg by mouth at bedtime. 03/11/17  Yes [provider]  ethyl chloride spray Apply 1 application topically daily as needed (dialysis). For use at dialysis    Yes [provider]  famotidine (PEPCID) 20 MG tablet Take 1 tablet (20 mg total) by mouth at bedtime. 08/01/17  Yes Drema Dallas, MD  gabapentin (NEURONTIN) 100 MG capsule Take 1 capsule (100 mg total) by mouth 3 (three) times daily. 12/30/16  Yes Marlin Canary  U, DO  hydrALAZINE (APRESOLINE) 50 MG tablet Take 50 mg by mouth 3 (three) times daily.   Yes [provider]  HYDROcodone-acetaminophen (NORCO) 5-325 MG tablet Take 1 tablet by mouth every 4 (four) hours as needed for moderate pain. 06/30/17  Yes Tegeler, Canary Brim, MD  insulin aspart (NOVOLOG FLEXPEN) 100 UNIT/ML FlexPen Inject 0-12 Units into the skin See admin instructions. 70-120=0 units, 121-150=2 units, 151-200=3 units, 201-250=5 units, 251-300=8 units, 301-350=11 units, 351-400=12 units   Yes [provider]  Insulin Pen Needle (PEN NEEDLES 29GX1/2") 29G X MISC 15 Units by Does not apply route at bedtime. 08/01/17  Yes Drema Dallas, MD  isosorbide mononitrate (IMDUR) 30 MG 24 hr tablet Take 1 tablet (30 mg total) by mouth daily at 12 noon. 08/02/17  Yes Drema Dallas, MD  labetalol (NORMODYNE) 300 MG tablet Take 300 mg by mouth 2 (two) times daily.   Yes [provider]  losartan (COZAAR) 100 MG tablet Take 100 mg by mouth daily.   Yes [provider]  Nutritional Supplements (FEEDING SUPPLEMENT, NEPRO CARB STEADY,) LIQD Take 237 mLs by mouth 2 (two) times daily between meals.   Yes [provider]  ondansetron (ZOFRAN) 4 MG tablet Take 1 tablet (4 mg total) by mouth every 8 (eight) hours as needed for nausea or vomiting. 06/30/17  Yes Tegeler, Canary Brim, MD  OXYGEN Inhale 3 L/min into the lungs as needed (shortness of breath).    Yes [provider]  Travoprost, BAK Free, (TRAVATAN) 0.004 % SOLN ophthalmic solution Place 1 drop into both eyes at bedtime.   Yes [provider]  insulin glargine (LANTUS) 100 unit/mL SOPN Inject 0.15 mLs (15 Units total) into the skin at bedtime. Patient not taking: Reported on 08/06/2017 08/01/17   Drema Dallas, MD    Family History Family History  Problem Relation Age of Onset  . Diabetes Mother   . Heart disease Mother   . COPD Father   . Hypertension Father   . Lung disease  Father   . Diabetes Father   . Diabetes Sister   . Breast cancer Sister     Social History Social History  Substance Use Topics  .  Smoking status: Former Smoker    Types: Cigarettes    Quit date: 08/04/1999  . Smokeless tobacco: Never Used  . Alcohol use No     Allergies   Iodinated diagnostic agents and Lactose intolerance (gi)   Review of Systems Review of Systems  Constitutional: Negative for chills and fever.  Respiratory: Negative for shortness of breath.   Cardiovascular: Positive for chest pain.  Gastrointestinal: Negative for abdominal pain, diarrhea, nausea and vomiting.  Neurological: Negative for syncope.  All other systems reviewed and are negative.    Physical Exam Updated Vital Signs BP 113/77   Pulse 78   Temp 97.8 F (36.6 C) (Oral)   Resp (!) 23   Ht  (1.676 m)   Wt 73.2 kg (161 lb 6 oz)   SpO2 95%   BMI 26.05 kg/m   Physical Exam  Constitutional: She appears well-developed and well-nourished. No distress.  HENT:  Head: Normocephalic and atraumatic.  Eyes: Conjunctivae are normal. Right eye exhibits no discharge. Left eye exhibits no discharge.  Neck: Normal range of motion. Neck supple. No JVD present. No tracheal deviation present. No thyromegaly present.  Cardiovascular:  Tachycardic, irregularly irregular rhythm, 1+ radial and DP/PT pulses bilaterally, Homan sign absent bilaterally, Dialysis fistula in the right [facility] with palpable thrill   Pulmonary/Chest: Effort normal. No respiratory distress. She has rales. She exhibits no tenderness.  Mild bibasilar crackles, equal rise and fall of chest, no increased work of breathing  Abdominal: Soft. Bowel sounds are normal. She exhibits no distension. There is no tenderness.  Musculoskeletal: She exhibits no edema.  Lymphadenopathy:    She has no cervical adenopathy.  Neurological: She is alert.  Mild right sided facial droop, chronic per pt due to unresolved Bell's Palsy. Fluent speech  without evidence of aphasia.   Skin: Skin is warm. No erythema.  Psychiatric: She has a normal mood and affect. Her behavior is normal.  Nursing note and vitals reviewed.    ED Treatments / Results  Labs (all labs ordered are listed, but only abnormal results are displayed) Labs Reviewed  BASIC METABOLIC PANEL - Abnormal; Notable for the following:       Result Value   Sodium 133 (*)    Chloride 91 (*)    Glucose, Bld 261 (*)    BUN 23 (*)    Creatinine, Ser 4.43 (*)    GFR calc non Af Amer 9 (*)    GFR calc Af Amer 11 (*)    All other components within normal limits  CBC - Abnormal; Notable for the following:    WBC 15.3 (*)    RBC 3.62 (*)    Hemoglobin 9.7 (*)    HCT 31.0 (*)    RDW 15.9 (*)    Platelets 593 (*)    All other components within normal limits  I-STAT TROPONIN, ED - Abnormal; Notable for the following:    Troponin i, poc 0.18 (*)    All other components within normal limits  I-STAT TROPONIN, ED - Abnormal; Notable for the following:    Troponin i, poc 0.16 (*)    All other components within normal limits    EKG  EKG Interpretation  Date/Time:  Wednesday August 06 2017 09:39:51 EDT Ventricular Rate:  128 PR Interval:    QRS Duration: 80 QT Interval:  334 QTC Calculation: 488 R Axis:   -51 Text Interpretation:  Atrial fibrillation Left anterior fascicular block Borderline T wave abnormalities Borderline prolonged QT interval  No STEMI.  Confirmed by Alona BeneLong, Joshua [phone]([id number]) on 08/06/2017 3:31:21 PM       Radiology Dg Chest Portable 1 View  Result Date: 08/06/2017 CLINICAL DATA:  Chest pain.  Renal failure.  Atrial fibrillation EXAM: PORTABLE CHEST 1 VIEW COMPARISON:  July 26, 2017 FINDINGS: There is atelectatic change in the left lung base region. There is no frank edema or consolidation. There is cardiomegaly. The pulmonary vascularity is within normal limits. There is aortic atherosclerosis. No evident bone lesions. No pneumothorax. IMPRESSION:  Left base atelectasis. No frank edema or consolidation. Stable cardiac enlargement. There is aortic atherosclerosis. Aortic Atherosclerosis (ICD10-I70.0). Electronically Signed   By: Bretta BangWilliam  Woodruff III M.D.   On: 08/06/2017 10:16    Procedures Procedures (including critical care time)  Medications Ordered in ED Medications  sodium chloride 0.9 % bolus 500 mL (0 mLs Intravenous Stopped 08/06/17 1200)  fentaNYL (SUBLIMAZE) injection 25 mcg (25 mcg Intravenous Given 08/06/17 1056)  sodium chloride 0.9 % bolus 1,000 mL (500 mLs Intravenous New Bag/Given 08/06/17 1454)     Initial Impression / Assessment and Plan / ED Course  I have reviewed the triage vital signs and the nursing notes.  Pertinent labs & imaging results that were available during my care of the patient were reviewed by me and considered in my medical decision making (see chart for details).     Patient with complicated medical history with acute onset of substernal chest pain this morning. Underwent one hour of hemodialysis but her heart rate was found to be very elevated. She was found to be back in A. fib with RVR. Initially hypotensive at 84/65 with some improvement after fluid bolus to 107/97. She is persistently tachycardic while in the ED. SPO2 greater than 95% while on 3 L nasal cannula. She does have an elevated troponin of 0.18, second troponin of 0.16 three hours later. EKG shows a-fib with RVR. CXR shows left base atelectasis but no frank edema or consolidation as well as stable cardiomegaly.  Spoke with Rosann Auerbachrish with cardiology, Cardiology service will see patient and make recommendations.    CHA2DS2/VAS Stroke Risk Points      6 >= 2 Points: High Risk  1 - 1.99 Points: Medium Risk  0 Points: Low Risk    The patient's score has not changed in the past year.:  No Change     Details    Note: External data might be a factor in metrics not marked with    Points Metrics   This score determines the patient's risk of  having a stroke if the  patient has atrial fibrillation.       1 Has Congestive Heart Failure:  Yes   1 Has Vascular Disease:  Yes   1 Has Hypertension:  Yes   1 Age:  [age]   1 Has Diabetes:  Yes   0 Had Stroke:  No Had TIA:  No Had thromboembolism:  No   1 Female:  Yes    Spoke with Dr. Royann Shiversroitoru who saw and evaluated pt. Echo shows no pericardial effusion, but does suggest pt is hypovolemic and recommends IV fluid hydration. Elevated trop likely related to demand ischemia from a-fib with RVR and ESRD. He also recommends medicine admit for further evaluation and management of a-fib. Patient given 1 L normal saline bolus with further improvement in blood pressure and heart rate. Pain managed in the ED, and after small dose of fentanyl, patient rates her chest pain as 1/10.  3:49 PM  Spoke with Dr. Konrad Dolores with [facility] who agrees to assume care of pt and bring her into the hospital for further medication management and rate control. Patient seen and evaluated by Dr. Adriana Simas, who agrees with assessment and plan at this time.  CRITICAL CARE Performed by: Jeanie Sewer   Total critical care time: 45 minutes  Critical care time was exclusive of separately billable procedures and treating other patients.  Critical care was necessary to treat or prevent imminent or life-threatening deterioration.  Critical care was time spent personally by me on the following activities: development of treatment plan with patient and/or surrogate as well as nursing, discussions with consultants, evaluation of patient's response to treatment, examination of patient, obtaining history from patient or surrogate, ordering and performing treatments and interventions, ordering and review of laboratory studies, ordering and review of radiographic studies, pulse oximetry and re-evaluation of patient's condition.   Final Clinical Impressions(s) / ED Diagnoses   Final diagnoses:  Paroxysmal A-fib (HCC)  Elevated troponin     New Prescriptions New Prescriptions   No medications on file     Bennye Alm 08/06/17 1553    Donnetta Hutching, MD 08/09/17 1116

## 2017-08-06 NOTE — Consult Note (Signed)
Reason for Consult: Continuity of ESRD care Referring Physician: Shelly Flattenavid Merrell M.D. [facility])  HPI:  71 year old African-American woman with past medical history significant for type 2 diabetes mellitus, hypertension with consequent end-stage renal disease on hemodialysis on a Monday/Wednesday/Friday schedule. She also has a history of coronary artery disease, peripheral vascular disease, paroxysmal/new onset atrial fibrillation (not on anticoagulation) and was recently admitted between 07/26/17 and 08/01/17 with a large pericardial effusion with RV collapse/tamponade physiology for which he underwent emergent pericardiocentesis.  She reports to have been doing better since her discharge from the hospital and was at her scheduled dialysis session which was interrupted after 1 hour because she developed tachycardia and hypotension with complaints of substernal chest pain. In the emergency room and en route monitoring, noted to have atrial fibrillation with rapid ventricular response. A bedside echocardiogram was done here in the emergency room and she does not have a recurrent pericardial effusion. She was given 1.5 L normal saline bolus with improvement of both blood pressure/tachycardia.  Hemodialysis prescription: M[facility]W/F [facility], 4 hours, blood flow rate 400, dialysate flow AF 1.5, estimated dry weight 71.5 kg, 2K/2.25 calcium, UF profile 2, right forearm AVG Mircera 100 g IV every 2 weeks......... last given 75 g on 07/16/17 Hectorol 1 g IV 3 times a week at dialysis No heparin  Past Medical History:  Diagnosis Date  . A-fib (HCC)   . Abnormal nuclear stress test 07/10/2016  . Anemia    low iron  . Anemia in chronic renal disease 07/08/2016  . Arthritis   . CAD in native artery 08/12/2016   a. Nonobstructive ASCAD by cath with 60% D1 and 50% PL off RCA and 25% mid LAD.  . Diabetes mellitus    type 2  . Diabetic retinopathy (HCC)   . Dialysis patient [facility](HCC)    M-W-F @ [facility]   . Diastolic dysfunction   . Dyspnea on exertion 04/14/2016  . Ejection fraction   . Elevated troponin 10/16/2015  . ESRD on hemodialysis [facility](HCC)    Dialysis T/Th/Sa  . GERD (gastroesophageal reflux disease)   . Glaucoma   . H/O: Bell's palsy 2007  . Hyperlipidemia   . Hypertension    a. labile - hx of BP dropping at HD.  . Macular degeneration   . Musculoskeletal chest pain   . PAD (peripheral artery disease) (HCC)    a. ABI 08/2016: mod RLE disease, normal L ABI.  Marland Kitchen. 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 PVD (peripheral vascular disease) (HCC) 03/18/2017  . Type 2 diabetes mellitus with renal complication (HCC) 09/26/2011    Past Surgical History:  Procedure Laterality Date  . ABDOMINAL HYSTERECTOMY    . ARTERIOVENOUS GRAFT PLACEMENT     Left forearm x 2, one removed in March  . ARTERIOVENOUS GRAFT PLACEMENT     Left forearm  . AVGG REMOVAL  10/08/2011   Procedure: REMOVAL OF ARTERIOVENOUS GORETEX GRAFT (AVGG);  Surgeon: Sherren Kernsharles E Fields, MD;  [facility];  Service: Vascular;  Laterality: Left;  . BTL    . CARDIAC CATHETERIZATION N/A 07/10/2016   Procedure: Left Heart Cath and Coronary Angiography;  Surgeon: Yvonne Kendallhristopher End, MD;  [facility];  Service: Cardiovascular;  Laterality: N/A;  . COLONOSCOPY    . EXCHANGE OF A DIALYSIS CATHETER Right 03/27/2016   Procedure: EXCHANGE OF A DIALYSIS CATHETER;  Surgeon: Fransisco HertzBrian L Chen, MD;  [facility];  Service: Vascular;  Laterality: Right;  . EYE SURGERY  Left    Lasik surgery on left. Cataract surgery on both eyes  . LAPAROTOMY  ?2000   Pelvic mass (Benign)  . PERICARDIOCENTESIS N/A 07/26/2017   Procedure: PERICARDIOCENTESIS;  Surgeon: Corky Crafts, MD;  Location: [facility] INVASIVE CV LAB;  Service: Cardiovascular;  Laterality: N/A;  . REVISION OF ARTERIOVENOUS GORETEX GRAFT Left 03/27/2016   Procedure: REVISION OF LEFT ARTERIOVENOUS GORETEX GRAFT;  Surgeon: Fransisco Hertz, MD;  Location: [facility] OR;   Service: Vascular;  Laterality: Left;  . TONSILLECTOMY      Family History  Problem Relation Age of Onset  . Diabetes Mother   . Heart disease Mother   . COPD Father   . Hypertension Father   . Lung disease Father   . Diabetes Father   . Diabetes Sister   . Breast cancer Sister     Social History:  reports that she quit smoking about 18 years ago. Her smoking use included Cigarettes. She has never used smokeless tobacco. She reports that she does not drink alcohol or use drugs.  Allergies:  Allergies  Allergen Reactions  . Iodinated Diagnostic Agents Hives and Itching  . Lactose Intolerance (Gi) Other (See Comments)    Abdominal pains and bloating    Medications:  Scheduled: . aspirin EC  81 mg Oral QHS  . calcium acetate  667-1,334 mg Oral See admin instructions  . cinacalcet  60 mg Oral Daily  . famotidine  20 mg Oral QHS  . [START ON 08/07/2017] feeding supplement (NEPRO CARB STEADY)  237 mL Oral BID BM  . gabapentin  100 mg Oral TID  . insulin aspart  0-5 Units Subcutaneous QHS  . [START ON 08/07/2017] insulin aspart  0-9 Units Subcutaneous TID WC  . latanoprost  1 drop Both Eyes QHS  . [START ON 08/07/2017] pantoprazole  80 mg Oral Q1200  . RENAL-VITE  1 tablet Oral QPM    BMP Latest Ref Rng & Units 08/06/2017 08/01/2017 07/31/2017  Glucose 65 - 99 mg/dL 098(J) 191(Y) 782(N)  BUN 6 - 20 mg/dL 56(O) 13(Y) 86(V)  Creatinine 0.44 - 1.00 mg/dL 7.84(O) 9.62(X) 5.28(U)  Sodium 135 - 145 mmol/L 133(L) 128(L) 131(L)  Potassium 3.5 - 5.1 mmol/L 4.2 4.9 4.2  Chloride 101 - 111 mmol/L 91(L) 90(L) 90(L)  CO2 22 - 32 mmol/L Calcium 8.9 - 10.3 mg/dL 9.2 9.1 1.3(K)   CBC Latest Ref Rng & Units 08/06/2017 08/01/2017 07/30/2017  WBC 4.0 - 10.5 K/uL 15.3(H) 13.8(H) 7.7  Hemoglobin 12.0 - 15.0 g/dL 4.4(W) 1.0(U) 7.2(Z)  Hematocrit 36.0 - 46.0 % 31.0(L) 30.4(L) 29.5(L)  Platelets 150 - 400 K/uL 593(H) 512(H) 476(H)   Troponin (Point of Care Test)  Recent Labs  08/06/17 1303   TROPIPOC 0.16*     Dg Chest Portable 1 View  Result Date: 08/06/2017 CLINICAL DATA:  Chest pain.  Renal failure.  Atrial fibrillation EXAM: PORTABLE CHEST 1 VIEW COMPARISON:  July 26, 2017 FINDINGS: There is atelectatic change in the left lung base region. There is no frank edema or consolidation. There is cardiomegaly. The pulmonary vascularity is within normal limits. There is aortic atherosclerosis. No evident bone lesions. No pneumothorax. IMPRESSION: Left base atelectasis. No frank edema or consolidation. Stable cardiac enlargement. There is aortic atherosclerosis. Aortic Atherosclerosis (ICD10-I70.0). Electronically Signed   By: Bretta Bang III M.D.   On: 08/06/2017 10:16    Review of Systems  Constitutional: Positive for malaise/fatigue. Negative for chills and fever.  HENT: Negative.  Eyes: Negative.   Respiratory: Negative for cough and shortness of breath.   Cardiovascular: Positive for chest pain and palpitations. Negative for orthopnea, claudication and leg swelling.  Gastrointestinal: Negative.   Genitourinary: Negative.   Musculoskeletal: Negative.   Skin: Negative.   Neurological: Negative for focal weakness.   Blood pressure 105/75, pulse (!) 119, temperature 97.8 F (36.6 C), temperature source Oral, resp. rate (!) 21, height  (1.676 m), weight 73.2 kg (161 lb 6 oz), SpO2 94 %. Physical Exam  Nursing note and vitals reviewed. Constitutional: She is oriented to person, place, and time. She appears well-developed and well-nourished. No distress.  HENT:  Head: Normocephalic and atraumatic.  Nose: Nose normal.  Mouth/Throat: No oropharyngeal exudate.  Eyes: Pupils are equal, round, and reactive to light. Conjunctivae and EOM are normal. Scleral icterus is present.  Neck: Normal range of motion. Neck supple. No JVD present.  Cardiovascular: Intact distal pulses.  Exam reveals no friction rub.   Murmur heard. Irregularly irregular tachycardia, 2/6  ejection systolic murmur  Respiratory: Effort normal and breath sounds normal. She has no wheezes. She has no rales.  GI: Soft. Bowel sounds are normal. There is no tenderness. There is no rebound.  Musculoskeletal: She exhibits no edema.  Right forearm AVG with fair thrill  Neurological: She is alert and oriented to person, place, and time.  Skin: Skin is warm and dry. No rash noted. No erythema.  Psychiatric: She has a normal mood and affect.    Assessment/Plan: 1. Atrial fibrillation with rapid ventricular tachycardia: Seen earlier by cardiology who performed a bedside echocardiogram together with a consultation. Ongoing efforts at rate control at this time. Anticipate ability to anticoagulate her prior to discharge. 2. End-stage renal disease: Underwent partial hemodialysis earlier today however without compelling indications for acute hemodialysis at this time. We'll continue to follow her closely to decide on timing for next hemodialysis. 3. Anemia of chronic kidney disease: Low hemoglobin level noted at 9.7, resume ESA with hemodialysis 4. Metabolic bone disease: Resume phosphorus binder-calcium acetate and restart Hectorol with hemodialysis for PTH suppression. 5. Nutrition: Continue renal diet, renal multivitamin and/or nutritional supplementation for albumin supplementation.  Kea Callan K. 08/06/2017, 6:20 PM

## 2017-08-06 NOTE — ED Triage Notes (Signed)
Pt woke up with CP this morning 8/10. Went to dialysis and only received one hour of treatment due to CP. EMS gave  ASA. Pt recently diagnosed with Afib. Pt in Afib today RVR. HR 125-150 per EMS. BP varied between 78/49-120/68 for EMS. CBG 297. Pt wears 2L [place] at home.

## 2017-08-06 NOTE — Progress Notes (Signed)
  Echocardiogram 2D Echocardiogram has been performed.  Janalyn HarderWest, Mellody Masri R 08/06/2017, 3:04 PM

## 2017-08-06 NOTE — Progress Notes (Signed)
Pt arrived to unit from ED. Pt is resting comfortably. Will continue to monitor.

## 2017-08-06 NOTE — H&P (Signed)
History and Physical    Colleen Mullins ZOX:[id number] DOB: 01/11/1946 DOA: 08/06/2017  PCP: Zoila ShutterWoodyear, Wynne E, MD Patient coming from:home   Chief Complaint: CP  HPI: Colleen Mullins is a 71 y.o. female with medical history significant of diabetes, peripheral vascular disease, hyperlipidemia, hypertension, glaucoma, Bell's palsy, GERD, ESRD on dialysis, CHF, diabetic neuropathy, CAD, atrial fibrillation.   Chest pain. Acute onset and substernal. Started on the morning of admission. Worse with exertion and improves with rest. Patient went to her usually scheduled dialysis session and had a stop after approximately 1 hour due to tachycardia and hypotension. EMS was called and patient was [facility]. In route patient was noted have an elevated heart rate into the 125-150 range and was showing atrophic fibrillation on the monitor. Of note patient was [facility]2017 after an admission for pericardial effusion, pericardiocentesis and new onset atrial fibrillation. Since time of discharge patient has felt relatively well until onset of symptoms on day of admission. Patient continues to smoke a few cigarettes per day. Denies recent fevers, nausea, vomiting, abdominal pain, neck stiffness, headache, focal neurological deficit.   ED Course: Objective findings outlined below. Cardiology consult. Given 1.5 L normal saline bolus with improvement in heart rate.  Review of Systems: As per HPI otherwise all other systems reviewed and are negative  Ambulatory Status: restricted due to cardiac health  Past Medical History:  Diagnosis Date  . A-fib (HCC)   . Abnormal nuclear stress test 07/10/2016  . Anemia    low iron  . Anemia in chronic renal disease 07/08/2016  . Arthritis   . CAD in native artery 08/12/2016   a. Nonobstructive ASCAD by cath with 60% D1 and 50% PL off RCA and 25% mid LAD.  . Diabetes mellitus    type 2  . Diabetic retinopathy  (HCC)   . Dialysis patient [facility](HCC)    M-W-F @ [facility]  . Diastolic dysfunction   . Dyspnea on exertion 04/14/2016  . Ejection fraction   . Elevated troponin 10/16/2015  . ESRD on hemodialysis [facility](HCC)    Dialysis T/Th/Sa  . GERD (gastroesophageal reflux disease)   . Glaucoma   . H/O: Bell's palsy 2007  . Hyperlipidemia   . Hypertension    a. labile - hx of BP dropping at HD.  . Macular degeneration   . Musculoskeletal chest pain   . PAD (peripheral artery disease) (HCC)    a. ABI 08/2016: mod RLE disease, normal L ABI.  Marland Kitchen. 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 PVD (peripheral vascular disease) (HCC) 03/18/2017  . Type 2 diabetes mellitus with renal complication (HCC) 09/26/2011    Past Surgical History:  Procedure Laterality Date  . ABDOMINAL HYSTERECTOMY    . ARTERIOVENOUS GRAFT PLACEMENT     Left forearm x 2, one removed in March  . ARTERIOVENOUS GRAFT PLACEMENT     Left forearm  . AVGG REMOVAL  10/08/2011   Procedure: REMOVAL OF ARTERIOVENOUS GORETEX GRAFT (AVGG);  Surgeon: Sherren Kernsharles E Fields, MD;  [facility];  Service: Vascular;  Laterality: Left;  . BTL    . CARDIAC CATHETERIZATION N/A 07/10/2016   Procedure: Left Heart Cath and Coronary Angiography;  Surgeon: Yvonne Kendallhristopher End, MD;  [facility];  Service: Cardiovascular;  Laterality: N/A;  . COLONOSCOPY    . EXCHANGE OF A DIALYSIS CATHETER Right 03/27/2016   Procedure: EXCHANGE OF A DIALYSIS CATHETER;  Surgeon: Fransisco HertzBrian L Chen, MD;  Location:  [facility] OR;  Service: Vascular;  Laterality: Right;  . EYE SURGERY Left    Lasik surgery on left. Cataract surgery on both eyes  . LAPAROTOMY  ?2000   Pelvic mass (Benign)  . PERICARDIOCENTESIS N/A 07/26/2017   Procedure: PERICARDIOCENTESIS;  Surgeon: Corky Crafts, MD;  Location: [facility] INVASIVE CV LAB;  Service: Cardiovascular;  Laterality: N/A;  . REVISION OF ARTERIOVENOUS GORETEX GRAFT Left 03/27/2016   Procedure: REVISION OF LEFT ARTERIOVENOUS GORETEX  GRAFT;  Surgeon: Fransisco Hertz, MD;  Location: [facility] OR;  Service: Vascular;  Laterality: Left;  . TONSILLECTOMY      Social History   Social History  . Marital status: Widowed    Spouse name: N/A  . Number of children: N/A  . Years of education: N/A   Occupational History  . retired Audiological scientist    Social History Main Topics  . Smoking status: Former Smoker    Types: Cigarettes    Quit date: 08/04/1999  . Smokeless tobacco: Never Used  . Alcohol use No  . Drug use: No  . Sexual activity: No   Other Topics Concern  . Not on file   Social History Narrative   Admitted to [facility] 06/12/17   Widowed   Former smoker-stopped 2000   Alcohol none   Full Code    Allergies  Allergen Reactions  . Iodinated Diagnostic Agents Hives and Itching  . Lactose Intolerance (Gi) Other (See Comments)    Abdominal pains and bloating    Family History  Problem Relation Age of Onset  . Diabetes Mother   . Heart disease Mother   . COPD Father   . Hypertension Father   . Lung disease Father   . Diabetes Father   . Diabetes Sister   . Breast cancer Sister       Prior to Admission medications   Medication Sig Start Date End Date Taking? Authorizing Provider  acetaminophen (TYLENOL) 650 MG CR tablet Take 650 mg by mouth every 8 (eight) hours as needed for pain.    Yes [provider]  amiodarone (PACERONE) 400 MG tablet Take 1 tablet (400 mg total) by mouth 2 (two) times daily. 08/01/17  Yes Drema Dallas, MD  amLODipine (NORVASC) 10 MG tablet Take 10 mg by mouth at bedtime.    Yes [provider]  aspirin EC 81 MG tablet Take 81 mg by mouth at bedtime.    Yes [provider]  B Complex-C-Folic Acid (RENAL-VITE) 0.8 MG TABS Take 1 tablet by mouth every evening. Vitamin B complex-vitamin C folic acid   Yes [provider]  calcium acetate (PHOSLO) 667 MG capsule Take 667-1,334 mg by mouth See admin instructions. Take 2 capsules (1334 mg) by  mouth 3 times daily with meals and 1 capsule (667 mg) with snacks   Yes [provider]  cinacalcet (SENSIPAR) 60 MG tablet Take 60 mg by mouth daily. Monday, Wednesday and Friday   Yes [provider]  cloNIDine (CATAPRES) 0.2 MG tablet Take 0.2 mg by mouth 3 (three) times daily.   Yes [provider]  cyclobenzaprine (FLEXERIL) 5 MG tablet Take 5 mg by mouth 3 (three) times daily as needed for muscle spasms.    Yes [provider]  esomeprazole (NEXIUM) 40 MG capsule Take 40 mg by mouth at bedtime. 03/11/17  Yes [provider]  ethyl chloride spray Apply 1 application topically daily as needed (dialysis). For use at dialysis  Yes [provider]  famotidine (PEPCID) 20 MG tablet Take 1 tablet (20 mg total) by mouth at bedtime. 08/01/17  Yes Drema Dallas, MD  gabapentin (NEURONTIN) 100 MG capsule Take 1 capsule (100 mg total) by mouth 3 (three) times daily. 12/30/16  Yes Marlin Canary U, DO  hydrALAZINE (APRESOLINE) 50 MG tablet Take 50 mg by mouth 3 (three) times daily.   Yes [provider]  HYDROcodone-acetaminophen (NORCO) 5-325 MG tablet Take 1 tablet by mouth every 4 (four) hours as needed for moderate pain. 06/30/17  Yes Tegeler, Canary Brim, MD  insulin aspart (NOVOLOG FLEXPEN) 100 UNIT/ML FlexPen Inject 0-12 Units into the skin See admin instructions. 70-120=0 units, 121-150=2 units, 151-200=3 units, 201-250=5 units, 251-300=8 units, 301-350=11 units, 351-400=12 units   Yes [provider]  Insulin Pen Needle (PEN NEEDLES 29GX1/2") 29G X MISC 15 Units by Does not apply route at bedtime. 08/01/17  Yes Drema Dallas, MD  isosorbide mononitrate (IMDUR) 30 MG 24 hr tablet Take 1 tablet (30 mg total) by mouth daily at 12 noon. 08/02/17  Yes Drema Dallas, MD  labetalol (NORMODYNE) 300 MG tablet Take 300 mg by mouth 2 (two) times daily.   Yes [provider]  losartan (COZAAR) 100 MG tablet Take 100 mg by mouth  daily.   Yes [provider]  Nutritional Supplements (FEEDING SUPPLEMENT, NEPRO CARB STEADY,) LIQD Take 237 mLs by mouth 2 (two) times daily between meals.   Yes [provider]  ondansetron (ZOFRAN) 4 MG tablet Take 1 tablet (4 mg total) by mouth every 8 (eight) hours as needed for nausea or vomiting. 06/30/17  Yes Tegeler, Canary Brim, MD  OXYGEN Inhale 3 L/min into the lungs as needed (shortness of breath).    Yes [provider]  Travoprost, BAK Free, (TRAVATAN) 0.004 % SOLN ophthalmic solution Place 1 drop into both eyes at bedtime.   Yes [provider]  insulin glargine (LANTUS) 100 unit/mL SOPN Inject 0.15 mLs (15 Units total) into the skin at bedtime. Patient not taking: Reported on 08/06/2017 08/01/17   Drema Dallas, MD    Physical Exam: Vitals:   08/06/17 1545 08/06/17 1600 08/06/17 1615 08/06/17 1645  BP: 104/67 114/65 (!) 112/55 105/75  Pulse: 60 78 89 (!) 119  Resp: 16 15 (!) 25 (!) 21  Temp:      TempSrc:      SpO2: 95% 93% 93% 94%  Weight:      Height:         General:  Appears calm and comfortable Eyes:  PERRL, EOMI, normal lids, iris ENT:  grossly normal hearing, lips & tongue, mmm Neck:  no LAD, masses or thyromegaly Cardiovascular: Irregularly irregular, trace lower extremity pitting edema. 4/6 systolic murmur. R AVF present w/ thrill Respiratory:  CTA bilaterally, no w/r/r. Normal respiratory effort. Abdomen:  soft, ntnd, NABS Skin:  no rash or induration seen on limited exam Musculoskeletal:  grossly normal tone BUE/BLE, good ROM, no bony abnormality Psychiatric:  grossly normal mood and affect, speech fluent and appropriate, AOx3 Neurologic:  CN 2-12 grossly intact, moves all extremities in coordinated fashion, sensation intact  Labs on Admission: I have personally reviewed following labs and imaging studies  CBC:  Recent Labs Lab 08/01/17 0939 08/06/17 0952  WBC 13.8* 15.3*  HGB 9.5* 9.7*  HCT 30.4* 31.0*  MCV  84.7 85.6  PLT 512* 593*   Basic Metabolic Panel:  Recent Labs Lab 07/31/17 0821 08/01/17 0939 08/06/17  0952  NA 131* 128* 133*  K 4.2 4.9 4.2  CL 90* 90* 91*  CO2 GLUCOSE 173* 247* 261*  BUN 29* 45* 23*  CREATININE 4.44* 6.34* 4.43*  CALCIUM 8.7* 9.1 9.2  MG 1.8  --   --   PHOS  --  4.5  --    GFR: Estimated Creatinine Clearance: 11.8 mL/min (A) (by C-G formula based on SCr of 4.43 mg/dL (H)). Liver Function Tests:  Recent Labs Lab 08/01/17 0939  ALBUMIN 2.7*   No results for input(s): LIPASE, AMYLASE in the last 168 hours. No results for input(s): AMMONIA in the last 168 hours. Coagulation Profile: No results for input(s): INR, PROTIME in the last 168 hours. Cardiac Enzymes: No results for input(s): CKTOTAL, CKMB, CKMBINDEX, TROPONINI in the last 168 hours. BNP (last 3 results) No results for input(s): PROBNP in the last 8760 hours. HbA1C: No results for input(s): HGBA1C in the last 72 hours. CBG:  Recent Labs Lab 07/31/17 1705 07/31/17 2113 08/01/17 0830 08/01/17 1508 08/01/17 1640  GLUCAP 187* 136* 168* 169* 193*   Lipid Profile: No results for input(s): CHOL, HDL, LDLCALC, TRIG, CHOLHDL, LDLDIRECT in the last 72 hours. Thyroid Function Tests: No results for input(s): TSH, T4TOTAL, FREET4, T3FREE, THYROIDAB in the last 72 hours. Anemia Panel: No results for input(s): VITAMINB12, FOLATE, FERRITIN, TIBC, IRON, RETICCTPCT in the last 72 hours. Urine analysis:    Component Value Date/Time   COLORURINE YELLOW 10/16/2015 1847   APPEARANCEUR CLEAR 10/16/2015 1847   LABSPEC 1.013 10/16/2015 1847   PHURINE 8.5 (H) 10/16/2015 1847   GLUCOSEU NEGATIVE 10/16/2015 1847   HGBUR SMALL (A) 10/16/2015 1847   BILIRUBINUR NEGATIVE 10/16/2015 1847   KETONESUR NEGATIVE 10/16/2015 1847   PROTEINUR >300 (A) 10/16/2015 1847   UROBILINOGEN 0.2 12/11/2012 0719   NITRITE NEGATIVE 10/16/2015 1847   LEUKOCYTESUR NEGATIVE 10/16/2015 1847    Creatinine  Clearance: Estimated Creatinine Clearance: 11.8 mL/min (A) (by C-G formula based on SCr of 4.43 mg/dL (H)).  Sepsis Labs: (procalcitonin:4,lacticidven:4) )No results found for this or any previous visit (from the past 240 hour(s)).   Radiological Exams on Admission: Dg Chest Portable 1 View  Result Date: 08/06/2017 CLINICAL DATA:  Chest pain.  Renal failure.  Atrial fibrillation EXAM: PORTABLE CHEST 1 VIEW COMPARISON:  July 26, 2017 FINDINGS: There is atelectatic change in the left lung base region. There is no frank edema or consolidation. There is cardiomegaly. The pulmonary vascularity is within normal limits. There is aortic atherosclerosis. No evident bone lesions. No pneumothorax. IMPRESSION: Left base atelectasis. No frank edema or consolidation. Stable cardiac enlargement. There is aortic atherosclerosis. Aortic Atherosclerosis (ICD10-I70.0). Electronically Signed   By: Bretta Bang III M.D.   On: 08/06/2017 10:16    EKG: Independently reviewed. A fib. No ACS.  Assessment/Plan Active Problems:   Type 2 diabetes mellitus with renal complication (HCC)   Elevated troponin   ESRD on dialysis (HCC)   Hypertensive heart disease with congestive heart failure (HCC)   PVD (peripheral vascular disease) (HCC)   GERD (gastroesophageal reflux disease)   Pericardial effusion   Chest pain   Atrial fibrillation with RVR (HCC)   CP/Pericardial effusion/Afib/RVR: A. fibrillation newly diagnosed during this admission at the beginning of the month. Recurrence of RVR. Cardiology consult by EDP. Greatly appreciate their assistance in the management of this patient. Placed on amiodarone drip with improvement in heart rate. Pericardial effusion noted during last admission with pericardiocentesis performed. Initial concern the patient's  chest pain was related to recurrence of her pericardial effusion. Bedside echo performed showing EF of 65% severe concentric ventricular hypertrophy and  no recurrence of pericardial effusion. - Further management per cardiology  Elevated troponin: POC Troponin 0.18 with 3. 0.16. Likely demand related. - Cycle troponins, telemetry  Hypotension: Likely due to a combination of A. fib with RVR and fluid depletion secondary to dialysis. Blood pressure has responded fairly well to a 1.5 L normal saline bolus in the ED. Anticipate additional improvement once patient's rate is better controlled. - Hold home Imdur, clonidine, hydralazine, losartan, labetalol, norvasc. - resumption of BP meds per cardiology - Hydralazine PRN SBP >160  ESRD: Dialysis Monday Wednesday Friday. Patient only underwent 1 hour of dialysis on the day of admission. No evidence of fluid overload or significant metabolic derangement. - Nephrology consult - Dialysis per nephrology  DM: - SSI  Chronic pain: - continue neurontin, flexeril, norco   DVT prophylaxis: SCD Code Status: full  Family Communication: none  Disposition Plan: pending CP r/o and improvement in Afib RVR  Consults called: cards   Admission status: inpt    MERRELL, DAVID J MD [facility]  If 7PM-7AM, please contact night-coverage [email or web address] Password [facility]  08/06/2017, 5:31 PM

## 2017-08-06 NOTE — Consult Note (Signed)
Cardiology Consultation:   Patient ID: Rilla Buckman; [id number]; 05/14/1946   Admit date: 08/06/2017 Date of Consult: 08/06/2017  Primary Care Provider: Zoila Shutter, MD Primary Cardiologist: Mayford Knife, MD Primary Electrophysiologist:  n/a   Patient Profile:   Clarie Camey is a 71 y.o. female with a hx of ASCAD with cath showing 60% D1 and 50% PL off the RCA by cath 06/2016 on medical management, DM, ESRD on HD (M/W/F), GERD, HTN and hyperlipidemia who is being seen today for the evaluation of chest pain at the request of Dr. Adriana Simas.  History of Present Illness:   Ms. Park recently had a hospital stay on 07/26/2017  to 08/01/2017  for pericardial effusion/Cardiac tamponade s/p emergent pericardiocentesis on 9/1 and drain removed 9/3. Cardiogenic shock, new onset afib. She is a dialysis patient and has been in dialysis for years. She was treated with Amiodarone 400 mg BID for 1 week then  PO QD recommended after. Not started on anticoagulation due to risk for hemorrhagic pericardial effusion. At discharge she was back in sinus rhythm.   He was seen at [facility] yesterday at [facility], she was doing well at this visit with minor adjustments made to her chronic medications. Today she woke up with chest pain 8/10, went to dialysis but only had 2/4 hours of treatment because of the pain. EMS was asked to transport to the hospital, given 324 mg ASA. She was found to be in atrial fibrillation with RVR 125-150. CBG 297.  ER COURSE: Troponin 0.18 >> 0.16. K nml, Chest xray left base atelectasis. No frank edema or consolidation. There is atherosclerosis. EKG HR 128, LAFB atrial fibrillation. Cardiac catheterization mild to moderate, non-obstructive CAD. No angiographic findings to explain patients atypical chest pain or MI perfusion stress test findings.  BP has been soft 111/66 HR poorly controlled at 120-130s. Pain is now gone. She does not feel symptoms that she knows of from her  atrial fibrillation. Echocardiogram has been ordered and tech has arrive to perform.   Past Medical History:  Diagnosis Date  . A-fib (HCC)   . Abnormal nuclear stress test 07/10/2016  . Anemia    low iron  . Anemia in chronic renal disease 07/08/2016  . Arthritis   . CAD in native artery 08/12/2016   a. Nonobstructive ASCAD by cath with 60% D1 and 50% PL off RCA and 25% mid LAD.  . Diabetes mellitus    type 2  . Diabetic retinopathy (HCC)   . Dialysis patient [facility])    M-W-F @ [facility]  . Diastolic dysfunction   . Dyspnea on exertion 04/14/2016  . Ejection fraction   . Elevated troponin 10/16/2015  . ESRD on hemodialysis [facility])    Dialysis T/Th/Sa  . GERD (gastroesophageal reflux disease)   . Glaucoma   . H/O: Bell's palsy 2007  . Hyperlipidemia   . Hypertension    a. labile - hx of BP dropping at HD.  . Macular degeneration   . Musculoskeletal chest pain   . PAD (peripheral artery disease) (HCC)    a. ABI 08/2016: mod RLE disease, normal L ABI.  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 PVD (peripheral vascular disease) (HCC) 03/18/2017  . Type 2 diabetes mellitus with renal complication (HCC) 09/26/2011    Past Surgical History:  Procedure Laterality Date  . ABDOMINAL HYSTERECTOMY    . ARTERIOVENOUS GRAFT PLACEMENT     Left forearm x 2, one removed in March  . ARTERIOVENOUS GRAFT  PLACEMENT     Left forearm  . AVGG REMOVAL  10/08/2011   Procedure: REMOVAL OF ARTERIOVENOUS GORETEX GRAFT (AVGG);  Surgeon: Sherren Kerns, MD;  Location: [facility] OR;  Service: Vascular;  Laterality: Left;  . BTL    . CARDIAC CATHETERIZATION N/A 07/10/2016   Procedure: Left Heart Cath and Coronary Angiography;  Surgeon: Yvonne Kendall, MD;  Location: [facility] INVASIVE CV LAB;  Service: Cardiovascular;  Laterality: N/A;  . COLONOSCOPY    . EXCHANGE OF A DIALYSIS CATHETER Right 03/27/2016   Procedure: EXCHANGE OF A DIALYSIS CATHETER;  Surgeon: Fransisco Hertz, MD;  Location: [facility] OR;   Service: Vascular;  Laterality: Right;  . EYE SURGERY Left    Lasik surgery on left. Cataract surgery on both eyes  . LAPAROTOMY  ?2000   Pelvic mass (Benign)  . PERICARDIOCENTESIS N/A 07/26/2017   Procedure: PERICARDIOCENTESIS;  Surgeon: Corky Crafts, MD;  Location: [facility] INVASIVE CV LAB;  Service: Cardiovascular;  Laterality: N/A;  . REVISION OF ARTERIOVENOUS GORETEX GRAFT Left 03/27/2016   Procedure: REVISION OF LEFT ARTERIOVENOUS GORETEX GRAFT;  Surgeon: Fransisco Hertz, MD;  Location: [facility] OR;  Service: Vascular;  Laterality: Left;  . TONSILLECTOMY       Inpatient Medications: Scheduled Meds:  Continuous Infusions:  PRN Meds:   Allergies:    Allergies  Allergen Reactions  . Iodinated Diagnostic Agents Hives and Itching  . Lactose Intolerance (Gi) Other (See Comments)    Abdominal pains and bloating    Social History:   Social History   Social History  . Marital status: Widowed    Spouse name: N/A  . Number of children: N/A  . Years of education: N/A   Occupational History  . retired Audiological scientist    Social History Main Topics  . Smoking status: Former Smoker    Types: Cigarettes    Quit date: 08/04/1999  . Smokeless tobacco: Never Used  . Alcohol use No  . Drug use: No  . Sexual activity: No   Other Topics Concern  . Not on file   Social History Narrative   Admitted to [facility] 06/12/17   Widowed   Former smoker-stopped 2000   Alcohol none   Full Code    Family History:   Family History  Problem Relation Age of Onset  . Diabetes Mother   . Heart disease Mother   . COPD Father   . Hypertension Father   . Lung disease Father   . Diabetes Father   . Diabetes Sister   . Breast cancer Sister      ROS:  Please see the history of present illness.  ROS  All other ROS reviewed and negative.     Physical Exam/Data:   Vitals:   08/06/17 0944 08/06/17 1015  BP: (!) 84/65 (!) 107/97  Pulse: (!) 130 (!) 106  Resp: 20 20  Temp: 97.8 F  (36.6 C)   TempSrc: Oral   SpO2: 97% 96%  Weight: 161 lb 6 oz (73.2 kg)   Height:  (1.676 m)    No intake or output data in the 24 hours ending 08/06/17 1310 Filed Weights   08/06/17 0944  Weight: 161 lb 6 oz (73.2 kg)   Body mass index is 26.05 kg/m.  General:  Well nourished, well developed, in no acute distress, elderly AA female HEENT: normal Lymph: no adenopathy Neck: no JVD Endocrine:  No thryomegaly Vascular: No carotid bruits; FA pulses 2+ bilaterally without bruits  Cardiac:  Atrial fibrillation with tachycardia Lungs:  clear to auscultation bilaterally, no wheezing, rhonchi or rales  Abd: soft, nontender, no hepatomegaly  Ext: no edema Musculoskeletal:  No deformities, BUE and BLE strength normal and equal Skin: warm and dry  Neuro:  CNs 2-12 intact, no focal abnormalities noted Psych:  Normal affect   EKG:  The EKG was personally reviewed and demonstrates:  EKG HR 128, LAFB atrial fibrillation Telemetry:  Telemetry was personally reviewed and demonstrates:  Rates 100-130s, atrial tachycardia.  Relevant CV Studies:   Echo 07/26/2017 Study Conclusions  - Left ventricle: The cavity size was normal. Wall thickness was   increased in a pattern of severe LVH. Systolic function was   normal. The estimated ejection fraction was in the range of 55%   to 60%. Wall motion was normal; there were no regional wall   motion abnormalities. - Mitral valve: Calcified annulus. - Left atrium: The atrium was severely dilated. - Pericardium, extracardiac: A large pericardial effusion was   identified.  Impressions:  - Normal LV systolic function; severe LVH; large pericardial   effusion with RV diastolic collapse and dilated IVC; findings c/w   tamponade physiology.  Cardiac Catheterization 07/10/2016  Left Heart Cath and Coronary Angiography  Conclusion   1.  Mild to moderate, non-obstructive coronary artery disease.  No angiographic findings to explain patient's  atypical chest pain or myocardial perfusion stress test findings. 2.  Normal left ventricular filling pressure.  Plan: 1.  Continue medical management and secondary prevention of CAD.  Yvonne Kendall, MD [facility] Pager: [phone]     Laboratory Data:  Chemistry Recent Labs Lab 07/31/17 [phone] 08/01/17 0939 08/06/17 0952  NA 131* 128* 133*  K 4.2 4.9 4.2  CL 90* 90* 91*  CO2 GLUCOSE 173* 247* 261*  BUN 29* 45* 23*  CREATININE 4.44* 6.34* 4.43*  CALCIUM 8.7* 9.1 9.2  GFRNONAA 9* 6* 9*  GFRAA 10* 7* 11*  ANIONGAP Recent Labs Lab 07/30/17 1403 08/01/17 0939  ALBUMIN 2.9* 2.7*   Hematology Recent Labs Lab 08/01/17 0939 08/06/17 0952  WBC 13.8* 15.3*  RBC 3.59* 3.62*  HGB 9.5* 9.7*  HCT 30.4* 31.0*  MCV 84.7 85.6  MCH 26.5 26.8  MCHC 31.3 31.3  RDW 16.3* 15.9*  PLT 512* 593*   Cardiac EnzymesNo results for input(s): TROPONINI in the last 168 hours.  Recent Labs Lab 08/06/17 0954  TROPIPOC 0.18*    BNPNo results for input(s): BNP, PROBNP in the last 168 hours.  DDimer No results for input(s): DDIMER in the last 168 hours.  Radiology/Studies:  Dg Chest Portable 1 View  Result Date: 08/06/2017 CLINICAL DATA:  Chest pain.  Renal failure.  Atrial fibrillation EXAM: PORTABLE CHEST 1 VIEW COMPARISON:  July 26, 2017 FINDINGS: There is atelectatic change in the left lung base region. There is no frank edema or consolidation. There is cardiomegaly. The pulmonary vascularity is within normal limits. There is aortic atherosclerosis. No evident bone lesions. No pneumothorax. IMPRESSION: Left base atelectasis. No frank edema or consolidation. Stable cardiac enlargement. There is aortic atherosclerosis. Aortic Atherosclerosis (ICD10-I70.0). Electronically Signed   By: Bretta Bang III M.D.   On: 08/06/2017 10:16    Assessment and Plan:    Chest Pain: Cath done 2017, Ms Graff has non obstructing CAD. Pain likely related to  uncontrolled atrial fibrillation. -- Troponins mildly elevated but this is likely related to a combination of demand  ischemia from her atrial fib with RVR and ESRD. -- Need to r/o tamponade,  Echocardiogram has been ordered and being performed now.  Atrial Fibrillation CHADSVSC 5: She has previously not been a candidate for anticoagulation because of her risk of hemorrhagic pericardial effusion but these may need to be reconsidered. At discharge she was back into sinus rhythm. -- rate is poorly controlled. Her BP has been sBP mid 80s and improved with a small fluid bolus to 110. She is unsure if she has taken her Amiodarone dose today or not.  HTN: Per Nephrology, BP has been low at 84/65 and mildly improved with a small fluid bolus to 107/97.  ESRD: Per nephrology  DM: Per primary team  Signed, Dorthula MatasGREENE,TIFFANY G, PA-C  08/06/2017    I have seen and examined the patient along with GREENE,TIFFANY G, PA-C .  I have reviewed the chart, notes and new data.  I agree with PA/NP's note.  Key new complaints: low BP with HD raises the concern for recurrent pericardial tamponade; chest pain is atypical Key examination changes: no clear evidence for volume overload, no rub, systolic murmur is present Key new findings / data: bedside review of echo does not show any effusion, but demonstrates a hyperdynamic LV with cavity obliteration and outflow tract obstruction of varying degrees, depending on the variable RR interval.   PLAN: Overall impression is that she is hypovolemic, hypotension worsened by atrial fibrillation with RVR.. May need to reestablish a higher dry weight at dialysis. Getting IVF now. Needs admission for AF rate control. She is still on a high "loading" amiodarone dose. Will recommend IV amiodarone for rate control - BP precludes conventional AV blocking agents Recurrent atrial fibrillation suggests that she does need long term anticoagulation, probably warfarin. It is encouraging  that she no longer has any effusion on preliminary review of echo. It may be best to start warfarin without IV heparin bridge.  Thurmon FairMihai Rickard Kennerly, MD, [facility] [phone] 08/06/2017, 3:42 PM

## 2017-08-06 NOTE — ED Notes (Signed)
Attempted to call report nurse not available at the time will call back in 5 minutes

## 2017-08-07 DIAGNOSIS — R748 Abnormal levels of other serum enzymes: Secondary | ICD-10-CM

## 2017-08-07 DIAGNOSIS — I4891 Unspecified atrial fibrillation: Secondary | ICD-10-CM

## 2017-08-07 DIAGNOSIS — I739 Peripheral vascular disease, unspecified: Secondary | ICD-10-CM

## 2017-08-07 DIAGNOSIS — R0789 Other chest pain: Secondary | ICD-10-CM

## 2017-08-07 DIAGNOSIS — R079 Chest pain, unspecified: Secondary | ICD-10-CM

## 2017-08-07 LAB — GLUCOSE, CAPILLARY
GLUCOSE-CAPILLARY: 204 mg/dL — AB (ref 65–99)
GLUCOSE-CAPILLARY: 233 mg/dL — AB (ref 65–99)
GLUCOSE-CAPILLARY: 205 mg/dL — AB (ref 65–99)
Glucose-Capillary: 204 mg/dL — ABNORMAL HIGH (ref 65–99)
Glucose-Capillary: 242 mg/dL — ABNORMAL HIGH (ref 65–99)
Glucose-Capillary: 257 mg/dL — ABNORMAL HIGH (ref 65–99)

## 2017-08-07 LAB — COMPREHENSIVE METABOLIC PANEL
ALT: 15 U/L (ref 14–54)
ANION GAP: 14 (ref 5–15)
AST: 30 U/L (ref 15–41)
Albumin: 2.5 g/dL — ABNORMAL LOW (ref 3.5–5.0)
Alkaline Phosphatase: 87 U/L (ref 38–126)
BILIRUBIN TOTAL: 0.5 mg/dL (ref 0.3–1.2)
BUN: 38 mg/dL — AB (ref 6–20)
CO2: 27 mmol/L (ref 22–32)
Calcium: 9.1 mg/dL (ref 8.9–10.3)
Chloride: 91 mmol/L — ABNORMAL LOW (ref 101–111)
Creatinine, Ser: 6.05 mg/dL — ABNORMAL HIGH (ref 0.44–1.00)
GFR, EST AFRICAN AMERICAN: 7 mL/min — AB (ref >60)
GFR, EST NON AFRICAN AMERICAN: 6 mL/min — AB (ref >60)
Glucose, Bld: 208 mg/dL — ABNORMAL HIGH (ref 65–99)
POTASSIUM: 4.2 mmol/L (ref 3.5–5.1)
Sodium: 132 mmol/L — ABNORMAL LOW (ref 135–145)
TOTAL PROTEIN: 6.1 g/dL — AB (ref 6.5–8.1)

## 2017-08-07 LAB — CBC
HEMATOCRIT: 27.9 % — AB (ref 36.0–46.0)
Hemoglobin: 8.5 g/dL — ABNORMAL LOW (ref 12.0–15.0)
MCH: 26.4 pg (ref 26.0–34.0)
MCHC: 30.5 g/dL (ref 30.0–36.0)
MCV: 86.6 fL (ref 78.0–100.0)
Platelets: 591 10*3/uL — ABNORMAL HIGH (ref 150–400)
RBC: 3.22 MIL/uL — ABNORMAL LOW (ref 3.87–5.11)
RDW: 16.2 % — AB (ref 11.5–15.5)
WBC: 9.1 10*3/uL (ref 4.0–10.5)

## 2017-08-07 LAB — PROTIME-INR
INR: 0.98
Prothrombin Time: 12.9 seconds (ref 11.4–15.2)

## 2017-08-07 MED ORDER — AMIODARONE HCL 200 MG PO TABS
400.0000 mg | ORAL_TABLET | Freq: Two times a day (BID) | ORAL | Status: DC
  Administered 2017-08-07 – 2017-08-12 (×10): 400 mg via ORAL
  Filled 2017-08-07 (×10): qty 2

## 2017-08-07 MED ORDER — COUMADIN BOOK
Freq: Once | Status: AC
  Administered 2017-08-07: 1
  Filled 2017-08-07 (×2): qty 1

## 2017-08-07 MED ORDER — DARBEPOETIN ALFA 200 MCG/0.4ML IJ SOSY
200.0000 ug | PREFILLED_SYRINGE | INTRAMUSCULAR | Status: DC
  Administered 2017-08-08: 200 ug via INTRAVENOUS
  Filled 2017-08-07: qty 0.4

## 2017-08-07 MED ORDER — WARFARIN SODIUM 2.5 MG PO TABS
2.5000 mg | ORAL_TABLET | Freq: Once | ORAL | Status: AC
  Administered 2017-08-07: 2.5 mg via ORAL
  Filled 2017-08-07: qty 1

## 2017-08-07 MED ORDER — WARFARIN VIDEO
Freq: Once | Status: AC
  Administered 2017-08-07: 14:00:00

## 2017-08-07 MED ORDER — ORAL CARE MOUTH RINSE
15.0000 mL | Freq: Two times a day (BID) | OROMUCOSAL | Status: DC
  Administered 2017-08-07 – 2017-08-11 (×3): 15 mL via OROMUCOSAL

## 2017-08-07 MED ORDER — WARFARIN - PHARMACIST DOSING INPATIENT
Freq: Every day | Status: DC
  Administered 2017-08-07: 18:00:00

## 2017-08-07 MED ORDER — INSULIN GLARGINE 100 UNIT/ML ~~LOC~~ SOLN
7.0000 [IU] | Freq: Every day | SUBCUTANEOUS | Status: DC
  Administered 2017-08-07 – 2017-08-11 (×5): 7 [IU] via SUBCUTANEOUS
  Filled 2017-08-07 (×6): qty 0.07

## 2017-08-07 NOTE — Progress Notes (Signed)
Subjective: Interval History: has no complaint.  Objective: Vital signs in last 24 hours: Temp:  [97.8 F (36.6 C)-98 F (36.7 C)] 98 F (36.7 C) (09/13 0332) Pulse Rate:  [54-130] 70 (09/13 0332) Resp:  [13-25] 16 (09/13 0332) BP: (84-150)/(54-112) 136/86 (09/13 0332) SpO2:  [93 %-100 %] 98 % (09/13 0332) Weight:  [73.2 kg (161 lb 6 oz)-74.3 kg (163 lb 12.8 oz)] 74.3 kg (163 lb 12.8 oz) (09/12 2027) Weight change:   Intake/Output from previous day: 09/12 0701 - 09/13 0700 In: 1412.1 [I.V.:412.1; IV Piggyback:1000] Out: -  Intake/Output this shift: No intake/output data recorded.  General appearance: alert, cooperative and no distress Resp: clear to auscultation bilaterally Cardio: S1, S2 normal and systolic murmur: holosystolic 3/6, blowing at apex GI: obese pos bs soft, Extremities: avf B&T  Lab Results:  Recent Labs  08/06/17 0952  WBC 15.3*  HGB 9.7*  HCT 31.0*  PLT 593*   BMET:  Recent Labs  08/06/17 0952  NA 133*  K 4.2  CL 91*  CO2 29  GLUCOSE 261*  BUN 23*  CREATININE 4.43*  CALCIUM 9.2   No results for input(s): PTH in the last 72 hours. Iron Studies: No results for input(s): IRON, TIBC, TRANSFERRIN, FERRITIN in the last 72 hours.  Studies/Results: Dg Chest Portable 1 View  Result Date: 08/06/2017 CLINICAL DATA:  Chest pain.  Renal failure.  Atrial fibrillation EXAM: PORTABLE CHEST 1 VIEW COMPARISON:  July 26, 2017 FINDINGS: There is atelectatic change in the left lung base region. There is no frank edema or consolidation. There is cardiomegaly. The pulmonary vascularity is within normal limits. There is aortic atherosclerosis. No evident bone lesions. No pneumothorax. IMPRESSION: Left base atelectasis. No frank edema or consolidation. Stable cardiac enlargement. There is aortic atherosclerosis. Aortic Atherosclerosis (ICD10-I70.0). Electronically Signed   By: Bretta BangWilliam  Woodruff III M.D.   On: 08/06/2017 10:16    I have reviewed the patient's  current medications.  Assessment/Plan: 1 ESRD stable, HD in ame 2 SVT  Controlled with amio 3 DM needs control 4 Anemia esa 5 HPTH 6 Pericardial dz P HD, esa, amio    LOS: 1 day   Pietra Zuluaga L 08/07/2017,7:15 AM

## 2017-08-07 NOTE — Progress Notes (Signed)
Progress Note  Patient Name: Colleen Mullins Date of Encounter: 08/07/2017  Primary Cardiologist: Dr. Mayford Knife  Subjective   Complains of severe sharp midsternal CP identical to her usual MSK CP that she has had since her accident.  Pain completely reproducible with palpation over her sternum.  Denies any dizziness.   Inpatient Medications    Scheduled Meds: . aspirin EC  81 mg Oral QHS  . calcium acetate  1,334 mg Oral TID WC  . [START ON 08/08/2017] cinacalcet  60 mg Oral Q M,W,F  . coumadin book   Does not apply Once  . [START ON 08/08/2017] darbepoetin (ARANESP) injection - DIALYSIS  200 mcg Intravenous Q Fri-HD  . famotidine  20 mg Oral QHS  . feeding supplement (NEPRO CARB STEADY)  237 mL Oral BID BM  . gabapentin  100 mg Oral TID  . insulin aspart  0-5 Units Subcutaneous QHS  . insulin aspart  0-9 Units Subcutaneous TID WC  . insulin glargine  7 Units Subcutaneous QHS  . latanoprost  1 drop Both Eyes QHS  . mouth rinse  15 mL Mouth Rinse BID  . multivitamin  1 tablet Oral QHS  . pantoprazole  80 mg Oral Q1200  . warfarin  2.5 mg Oral ONCE-1800  . warfarin   Does not apply Once  . Warfarin - Pharmacist Dosing Inpatient   Does not apply q1800   Continuous Infusions: . amiodarone    . amiodarone 30 mg/hr (08/07/17 1600)   PRN Meds: acetaminophen **OR** acetaminophen, calcium acetate, cyclobenzaprine, HYDROcodone-acetaminophen, ondansetron **OR** ondansetron (ZOFRAN) IV   Vital Signs    Vitals:   08/07/17 0738 08/07/17 1156 08/07/17 1502 08/07/17 1558  BP: 138/60 (!) 175/70 (!) 173/72 (!) 161/71  Pulse: 69 79 77 75  Resp: 18 20 (!) 23 (!) 22  Temp: 97.9 F (36.6 C) (!) 97.4 F (36.3 C) (!) 97.5 F (36.4 C)   TempSrc: Oral Oral Oral   SpO2: 100% 100% 100% 97%  Weight:      Height:        Intake/Output Summary (Last 24 hours) at 08/07/17 1656 Last data filed at 08/07/17 1200  Gross per 24 hour  Intake          1769.11 ml  Output                0 ml  Net           1769.11 ml   Filed Weights   08/06/17 0944 08/06/17 2027  Weight: 161 lb 6 oz (73.2 kg) 163 lb 12.8 oz (74.3 kg)    Telemetry    NSR- Personally Reviewed  ECG    No new EKG to review  - Personally Reviewed  Physical Exam   GEN: No acute distress.   Neck: No JVD Cardiac: RRR , no murmurs, rubs, or gallops. Very tender to palpation over sternum Respiratory: Clear to auscultation bilaterally. GI: Soft, nontender, non-distended  MS: No edema; No deformity. Neuro:  Nonfocal  Psych: Normal affect   Labs    Chemistry Recent Labs Lab 08/01/17 0939 08/06/17 0952 08/07/17 0759  NA 128* 133* 132*  K 4.9 4.2 4.2  CL 90* 91* 91*  CO2 GLUCOSE 247* 261* 208*  BUN 45* 23* 38*  CREATININE 6.34* 4.43* 6.05*  CALCIUM 9.1 9.2 9.1  PROT  --   --  6.1*  ALBUMIN 2.7*  --  2.5*  AST  --   --  30  ALT  --   --  15  ALKPHOS  --   --  87  BILITOT  --   --  0.5  GFRNONAA 6* 9* 6*  GFRAA 7* 11* 7*  ANIONGAP Hematology Recent Labs Lab 08/01/17 0939 08/06/17 0952 08/07/17 0759  WBC 13.8* 15.3* 9.1  RBC 3.59* 3.62* 3.22*  HGB 9.5* 9.7* 8.5*  HCT 30.4* 31.0* 27.9*  MCV 84.7 85.6 86.6  MCH 26.5 26.8 26.4  MCHC 31.3 31.3 30.5  RDW 16.3* 15.9* 16.2*  PLT 512* 593* 591*    Cardiac EnzymesNo results for input(s): TROPONINI in the last 168 hours.  Recent Labs Lab 08/06/17 0954 08/06/17 1303  TROPIPOC 0.18* 0.16*     BNPNo results for input(s): BNP, PROBNP in the last 168 hours.   DDimer No results for input(s): DDIMER in the last 168 hours.   Radiology    Dg Chest Portable 1 View  Result Date: 08/06/2017 CLINICAL DATA:  Chest pain.  Renal failure.  Atrial fibrillation EXAM: PORTABLE CHEST 1 VIEW COMPARISON:  July 26, 2017 FINDINGS: There is atelectatic change in the left lung base region. There is no frank edema or consolidation. There is cardiomegaly. The pulmonary vascularity is within normal limits. There is aortic atherosclerosis. No  evident bone lesions. No pneumothorax. IMPRESSION: Left base atelectasis. No frank edema or consolidation. Stable cardiac enlargement. There is aortic atherosclerosis. Aortic Atherosclerosis (ICD10-I70.0). Electronically Signed   By: Bretta Bang III M.D.   On: 08/06/2017 10:16    Cardiac Studies   2D echo 07/2017 Study Conclusions  - Left ventricle: The cavity size was normal. There was severe   concentric hypertrophy. Systolic function was vigorous. The   estimated ejection fraction was in the range of 65% to 70%. Wall   motion was normal; there were no regional wall motion   abnormalities. - Aortic valve: Trileaflet; mildly thickened, moderately calcified   leaflets. - Mitral valve: Calcified annulus. Mildly thickened leaflets . - Pericardium, extracardiac: There was no pericardial effusion.  Impressions: - Compared to the prior study, there has been no significant   interval change.    Patient Profile     71 y.o. female with a hx of ASCAD with cath showing 60% D1 and 50% PL off the RCA by cath 06/2016 on medical management, DM, ESRD on HD (M/W/F), GERD, HTN and hyperlipidemia who is being seen today for the evaluation of chest pain   Assessment & Plan    Chest Pain: pain is very atypical and identical to the CP she has been having and is reproducible with palpation of her chest wall.  -- Trop mildly elevated with flat trend (0.18>0.16) and likely related to demand ischemia in the setting of afib with RVR and underlying ESRD.Marland Kitchen -- 2D echo showed normal LVF with no pericardial effusion.  EF 65-70% with no wall motion abnormality  Atrial Fibrillation CHADSVSC 5: She has previously not been a candidate for anticoagulation because of her risk of hemorrhagic pericardial effusion but this has resolved on last 2 echos.  -- she is now back in NSR after starting IV Amio.   -- wills top IV Amio and change back to Amio  BID -- will start Coumadin per pharmacy for [facility]  score of  6 without IV heparin bridge due to recent pericardia effusion  Hypotension -- bedside echo initially on admission indicated hyperdynamic LVF with cavity obliteration and outflow obstruction and it was felt that she had  hypovolemic hypotension worsened by rapid afib.   -- hypotension has resolved with IVF resuscitation and actually BP high now.   --   HTN: Per Nephrology, hypotension resolved and now BP high -- recommend restarting antihypertensive meds initially with the ones that have AVN blocking ability to help slow rate if she has a reoccurence of afib.   ESRD: Per nephrology  DM: Per primary team  For questions or updates, please contact [facility] Please consult [email or web address] for contact info under Cardiology/STEMI.      Signed, Armanda Magicraci Turner, MD  08/07/2017, 4:56 PM

## 2017-08-07 NOTE — Progress Notes (Signed)
ANTICOAGULATION CONSULT NOTE - Initial Consult  Pharmacy Consult for atrial fibrillation Indication: atrial fibrillation  Allergies  Allergen Reactions  . Iodinated Diagnostic Agents Hives and Itching  . Lactose Intolerance (Gi) Other (See Comments)    Abdominal pains and bloating    Patient Measurements: Height:  (167.6 cm) Weight: 163 lb 12.8 oz (74.3 kg) IBW/kg (Calculated) : 59.3  Vital Signs: Temp: 97.4 F (36.3 C) (09/13 1156) Temp Source: Oral (09/13 1156) BP: 175/70 (09/13 1156) Pulse Rate: 79 (09/13 1156)  Labs:  Recent Labs  08/06/17 0952 08/07/17 0759  HGB 9.7* 8.5*  HCT 31.0* 27.9*  PLT 593* 591*  CREATININE 4.43* 6.05*    Estimated Creatinine Clearance: 8.7 mL/min (A) (by C-G formula based on SCr of 6.05 mg/dL (H)).   Medical History: Past Medical History:  Diagnosis Date  . A-fib (HCC)   . Abnormal nuclear stress test 07/10/2016  . Anemia    low iron  . Anemia in chronic renal disease 07/08/2016  . Arthritis   . CAD in native artery 08/12/2016   a. Nonobstructive ASCAD by cath with 60% D1 and 50% PL off RCA and 25% mid LAD.  . Diabetes mellitus    type 2  . Diabetic retinopathy (HCC)   . Dialysis patient [facility])    M-W-F @ [facility]  . Diastolic dysfunction   . Dyspnea on exertion 04/14/2016  . Ejection fraction   . Elevated troponin 10/16/2015  . ESRD on hemodialysis [facility])    Dialysis T/Th/Sa  . GERD (gastroesophageal reflux disease)   . Glaucoma   . H/O: Bell's palsy 2007  . Hyperlipidemia   . Hypertension    a. labile - hx of BP dropping at HD.  . Macular degeneration   . Musculoskeletal chest pain   . PAD (peripheral artery disease) (HCC)    a. ABI 08/2016: mod RLE disease, normal L ABI.  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 PVD (peripheral vascular disease) (HCC) 03/18/2017  . Type 2 diabetes mellitus with renal complication (HCC) 09/26/2011    Medications:  Scheduled:  . aspirin EC  81 mg Oral QHS   . calcium acetate  1,334 mg Oral TID WC  . [START ON 08/08/2017] cinacalcet  60 mg Oral Q M,W,F  . coumadin book   Does not apply Once  . [START ON 08/08/2017] darbepoetin (ARANESP) injection - DIALYSIS  200 mcg Intravenous Q Fri-HD  . famotidine  20 mg Oral QHS  . feeding supplement (NEPRO CARB STEADY)  237 mL Oral BID BM  . gabapentin  100 mg Oral TID  . insulin aspart  0-5 Units Subcutaneous QHS  . insulin aspart  0-9 Units Subcutaneous TID WC  . insulin glargine  7 Units Subcutaneous QHS  . latanoprost  1 drop Both Eyes QHS  . mouth rinse  15 mL Mouth Rinse BID  . multivitamin  1 tablet Oral QHS  . pantoprazole  80 mg Oral Q1200  . warfarin  2.5 mg Oral ONCE-1800  . warfarin   Does not apply Once  . Warfarin - Pharmacist Dosing Inpatient   Does not apply q1800    Assessment: 42 yof admitted with recent diagnosis of atrial fibrillation; however, no anticoagulation prior to admission due to pericardial effusion during hospitalization early this month. No evidence of pericardial effusion this admission.  CHADSVSC score is 5. Pharmacy consulted to dose warfarin for atrial fibrillation with no bridge. Baseline INR ordered. H/H and platelets stable. No signs/symptoms of bleeding.  On concurrent amiodarone, which has a delayed impact of INR.  Goal of Therapy:  INR 2-3 Monitor platelets by anticoagulation protocol: Yes   Plan:  Will order warfarin 2.5 mg tonight Monitor daily INR levels Continue to monitor H&H and platelets  Rosslyn Pasion PerGirard Cooterkins, PharmD Clinical Pharmacist  Phone: [phone] 08/07/2017,1:52 PM

## 2017-08-07 NOTE — Progress Notes (Addendum)
Inpatient Diabetes Program Recommendations  AACE/ADA: New Consensus Statement on Inpatient Glycemic Control (2015)  Target Ranges:  Prepandial:   less than 140 mg/dL      Peak postprandial:   less than 180 mg/dL (1-2 hours)      Critically ill patients:  140 - 180 mg/dL   Lab Results  Component Value Date   GLUCAP 204 (H) 08/07/2017   HGBA1C 5.6 12/29/2016    Review of Glycemic Control  Results for Colleen Mullins, Colleen (MRN [id number]) as of 08/07/2017 09:54  Ref. Range 08/06/2017 21:27 08/07/2017 07:38  Glucose-Capillary Latest Ref Range: 65 - 99 mg/dL 409232 (H) 811204 (H)    Diabetes history: Type 2 diabetes Outpatient Diabetes medications: Novolog 0-12 units tid, Lantus 15 units qhs Current orders for Inpatient glycemic control: Novolog 0-9 units tid, Novolog 0-5 units qhs  Inpatient Diabetes Program Recommendations: Consider restarting Lantus at 7 units qhs (0.1 units/kg)  Spoke with RN regarding recommendations and am insulin administration.  Encouraged to recheck am BG before dosing the Novolog insulin this am as it has been over 2 hours since the blood sugar has been checked.     Susette RacerJulie Batu Cassin, RN, BA, MHA, CDE Diabetes Coordinator Inpatient Diabetes Program  [phone] (Team Pager) [phone] [facility] Office) 08/07/2017 10:08 AM

## 2017-08-07 NOTE — Progress Notes (Signed)
Pt. Refused SCD's. Educated about possible risks and complications. Will continue to monitor.

## 2017-08-07 NOTE — Progress Notes (Signed)
Patient ID: Colleen Mullins, female   DOB: 01/01/1946, 71 y.o.   MRN: [id number]  PROGRESS NOTE    Colleen Mullins  [id number] DOB: 02/26/1946 DOA: 08/06/2017 PCP: Zoila ShutterWoodyear, Wynne E, MD   Brief Narrative: 71 year old female with history of diabetes, peripheral vascular disease, hyperlipidemia, hypertension, glaucoma, Bell's palsy, GERD, end-stage renal disease on dialysis, CHF, diabetic neuropathy, coronary artery disease and recent hospital admission and discharge from [facility] on 08/01/2017 for pericardial effusion status post pericardiocentesis and new onset atrial fibrillation for which she was started on amiodarone orally but no anticoagulation because of pericardial effusion. She was admitted on 08/06/2017 4 complaints of chest pain and was found to have A. fib with rapid ventricular rate. Cardiology has already evaluated the patient. Patient is currently on amiodarone drip. Nephrology consultation was also called  Assessment & Plan:   Active Problems:   Type 2 diabetes mellitus with renal complication (HCC)   Elevated troponin   ESRD on dialysis (HCC)   Hypertensive heart disease with congestive heart failure (HCC)   PVD (peripheral vascular disease) (HCC)   GERD (gastroesophageal reflux disease)   Pericardial effusion   Chest pain   Atrial fibrillation with RVR (HCC)  A. fib with RVR - Currently on amiodarone drip. Heart rate much improved. Cardiology consultation appreciated. Follow further recommendations from cardiology - Patient might need to be started on anticoagulation, probably Coumadin. Wait for cardiology recommendations. - Pericardial effusion noted during last admission with pericardiocentesis performed. Initial concern the patient's chest pain was related to recurrence of her pericardial effusion. Bedside echo performed showing EF of 65% severe concentric ventricular hypertrophy and no recurrence of pericardial effusion.  Chest pain with elevated troponin -  Probably secondary to above. Currently chest pain-free. Troponin did not increase significantly. - Patient has history of non obstructive coronary artery disease  Hypotension:  - Likely due to a combination of A. fib with RVR and fluid depletion secondary to dialysis.  - Blood pressure improving.  - Hold home Imdur, clonidine, hydralazine, losartan, labetalol, norvasc. - resumption of BP meds per cardiology  ESRD on hemodialysis - Nephrology consult appreciated - Dialysis per nephrology  DM: - SSI  Chronic pain: - continue neurontin, flexeril, norco   DVT prophylaxis: SCDs Code Status:  Full Family Communication: None at bedside Disposition Plan: Home in 1-3 days  Consultants: Cardiology and nephrology  Procedures: Limited echo on 08/06/2017: Study Conclusions  - Left ventricle: The cavity size was normal. There was severe   concentric hypertrophy. Systolic function was vigorous. The   estimated ejection fraction was in the range of 65% to 70%. Wall   motion was normal; there were no regional wall motion   abnormalities. - Aortic valve: Trileaflet; mildly thickened, moderately calcified   leaflets. - Mitral valve: Calcified annulus. Mildly thickened leaflets . - Pericardium, extracardiac: There was no pericardial effusion.  Impressions:  - Compared to the prior study, there has been no significant   interval change.   Antimicrobials: None  Subjective: Patient seen and examined at bedside. She denies currently any current chest pain, nausea, vomiting or palpitations. She feels better.  Objective: Vitals:   08/06/17 2329 08/07/17 0300 08/07/17 0332 08/07/17 0738  BP: (!) 143/61 (!) 127/112 136/86 138/60  Pulse: 67 69 70 69  Resp: 13 17 16 18   Temp: 98 F (36.7 C)  98 F (36.7 C) 97.9 F (36.6 C)  TempSrc: Oral  Oral Oral  SpO2: 100% 98% 98% 100%  Weight:  Height:        Intake/Output Summary (Last 24 hours) at 08/07/17 0936 Last data filed  at 08/07/17 0300  Gross per 24 hour  Intake          1412.11 ml  Output                0 ml  Net          1412.11 ml   Filed Weights   08/06/17 0944 08/06/17 2027  Weight: 73.2 kg (161 lb 6 oz) 74.3 kg (163 lb 12.8 oz)    Examination:  General exam: Appears calm and comfortable  Respiratory system: Bilateral decreased breath sound at bases Cardiovascular system: S1 & S2 heard, Rate controlled  Gastrointestinal system: Abdomen is nondistended, soft and nontender. Normal bowel sounds heard. Extremities: No cyanosis, clubbing; trace edema     Data Reviewed: I have personally reviewed following labs and imaging studies  CBC:  Recent Labs Lab 08/01/17 0939 08/06/17 0952 08/07/17 0759  WBC 13.8* 15.3* 9.1  HGB 9.5* 9.7* 8.5*  HCT 30.4* 31.0* 27.9*  MCV 84.7 85.6 86.6  PLT 512* 593* 591*   Basic Metabolic Panel:  Recent Labs Lab 08/01/17 0939 08/06/17 0952 08/06/17 1734 08/07/17 0759  NA 128* 133*  --  132*  K 4.9 4.2  --  4.2  CL 90* 91*  --  91*  CO2 24 29  --  27  GLUCOSE 247* 261*  --  208*  BUN 45* 23*  --  38*  CREATININE 6.34* 4.43*  --  6.05*  CALCIUM 9.1 9.2  --  9.1  MG  --   --  2.1  --   PHOS 4.5  --  2.9  --    GFR: Estimated Creatinine Clearance: 8.7 mL/min (A) (by C-G formula based on SCr of 6.05 mg/dL (H)). Liver Function Tests:  Recent Labs Lab 08/01/17 0939 08/07/17 0759  AST  --  30  ALT  --  15  ALKPHOS  --  87  BILITOT  --  0.5  PROT  --  6.1*  ALBUMIN 2.7* 2.5*   No results for input(s): LIPASE, AMYLASE in the last 168 hours. No results for input(s): AMMONIA in the last 168 hours. Coagulation Profile: No results for input(s): INR, PROTIME in the last 168 hours. Cardiac Enzymes: No results for input(s): CKTOTAL, CKMB, CKMBINDEX, TROPONINI in the last 168 hours. BNP (last 3 results) No results for input(s): PROBNP in the last 8760 hours. HbA1C: No results for input(s): HGBA1C in the last 72 hours. CBG:  Recent Labs Lab  08/01/17 0830 08/01/17 1508 08/01/17 1640 08/06/17 2127 08/07/17 0738  GLUCAP 168* 169* 193* 232* 204*   Lipid Profile: No results for input(s): CHOL, HDL, LDLCALC, TRIG, CHOLHDL, LDLDIRECT in the last 72 hours. Thyroid Function Tests: No results for input(s): TSH, T4TOTAL, FREET4, T3FREE, THYROIDAB in the last 72 hours. Anemia Panel: No results for input(s): VITAMINB12, FOLATE, FERRITIN, TIBC, IRON, RETICCTPCT in the last 72 hours. Sepsis Labs: No results for input(s): PROCALCITON, LATICACIDVEN in the last 168 hours.  No results found for this or any previous visit (from the past 240 hour(s)).       Radiology Studies: Dg Chest Portable 1 View  Result Date: 08/06/2017 CLINICAL DATA:  Chest pain.  Renal failure.  Atrial fibrillation EXAM: PORTABLE CHEST 1 VIEW COMPARISON:  July 26, 2017 FINDINGS: There is atelectatic change in the left lung base region. There is no frank edema or consolidation. There  is cardiomegaly. The pulmonary vascularity is within normal limits. There is aortic atherosclerosis. No evident bone lesions. No pneumothorax. IMPRESSION: Left base atelectasis. No frank edema or consolidation. Stable cardiac enlargement. There is aortic atherosclerosis. Aortic Atherosclerosis (ICD10-I70.0). Electronically Signed   By: Bretta Bang III M.D.   On: 08/06/2017 10:16        Scheduled Meds: . aspirin EC  81 mg Oral QHS  . calcium acetate  1,334 mg Oral TID WC  . [START ON 08/08/2017] cinacalcet  60 mg Oral Q M,W,F  . [START ON 08/08/2017] darbepoetin (ARANESP) injection - DIALYSIS  200 mcg Intravenous Q Fri-HD  . famotidine  20 mg Oral QHS  . feeding supplement (NEPRO CARB STEADY)  237 mL Oral BID BM  . gabapentin  100 mg Oral TID  . insulin aspart  0-5 Units Subcutaneous QHS  . insulin aspart  0-9 Units Subcutaneous TID WC  . latanoprost  1 drop Both Eyes QHS  . mouth rinse  15 mL Mouth Rinse BID  . multivitamin  1 tablet Oral QHS  . pantoprazole  80 mg  Oral Q1200   Continuous Infusions: . amiodarone    . amiodarone 30 mg/hr (08/07/17 0800)     LOS: 1 day        Glade Lloyd, MD [facility] Pager [phone]  If 7PM-7AM, please contact night-coverage [email or web address] Password [facility] 08/07/2017, 9:36 AM

## 2017-08-08 DIAGNOSIS — I313 Pericardial effusion (noninflammatory): Secondary | ICD-10-CM

## 2017-08-08 LAB — CBC
HCT: 28.8 % — ABNORMAL LOW (ref 36.0–46.0)
Hemoglobin: 9 g/dL — ABNORMAL LOW (ref 12.0–15.0)
MCH: 26.4 pg (ref 26.0–34.0)
MCHC: 31.3 g/dL (ref 30.0–36.0)
MCV: 84.5 fL (ref 78.0–100.0)
PLATELETS: 658 10*3/uL — AB (ref 150–400)
RBC: 3.41 MIL/uL — AB (ref 3.87–5.11)
RDW: 16 % — AB (ref 11.5–15.5)
WBC: 9.3 10*3/uL (ref 4.0–10.5)

## 2017-08-08 LAB — GLUCOSE, CAPILLARY
GLUCOSE-CAPILLARY: 118 mg/dL — AB (ref 65–99)
Glucose-Capillary: 208 mg/dL — ABNORMAL HIGH (ref 65–99)
Glucose-Capillary: 215 mg/dL — ABNORMAL HIGH (ref 65–99)

## 2017-08-08 LAB — PROTIME-INR
INR: 1
PROTHROMBIN TIME: 13.1 s (ref 11.4–15.2)

## 2017-08-08 MED ORDER — SODIUM CHLORIDE 0.9 % IV SOLN: 100.0000 mL | INTRAVENOUS | Status: DC | PRN

## 2017-08-08 MED ORDER — IBUPROFEN 200 MG PO TABS
400.0000 mg | ORAL_TABLET | Freq: Three times a day (TID) | ORAL | Status: DC
  Administered 2017-08-08 – 2017-08-10 (×5): 400 mg via ORAL
  Filled 2017-08-08 (×6): qty 2

## 2017-08-08 MED ORDER — LABETALOL HCL 200 MG PO TABS
300.0000 mg | ORAL_TABLET | Freq: Two times a day (BID) | ORAL | Status: DC
  Administered 2017-08-08 – 2017-08-12 (×9): 300 mg via ORAL
  Filled 2017-08-08 (×10): qty 1

## 2017-08-08 MED ORDER — LIDOCAINE HCL (PF) 1 % IJ SOLN: 5.0000 mL | INTRAMUSCULAR | Status: DC | PRN

## 2017-08-08 MED ORDER — HEPARIN SODIUM (PORCINE) 1000 UNIT/ML DIALYSIS: 1000.0000 [IU] | INTRAMUSCULAR | Status: DC | PRN

## 2017-08-08 MED ORDER — ALTEPLASE 2 MG IJ SOLR: 2.0000 mg | Freq: Once | INTRAMUSCULAR | Status: DC | PRN

## 2017-08-08 MED ORDER — DARBEPOETIN ALFA 200 MCG/0.4ML IJ SOSY
PREFILLED_SYRINGE | INTRAMUSCULAR | Status: AC
  Filled 2017-08-08: qty 0.4

## 2017-08-08 MED ORDER — AMLODIPINE BESYLATE 10 MG PO TABS
10.0000 mg | ORAL_TABLET | Freq: Every day | ORAL | Status: DC
  Administered 2017-08-08 – 2017-08-11 (×4): 10 mg via ORAL
  Filled 2017-08-08 (×4): qty 1

## 2017-08-08 MED ORDER — PENTAFLUOROPROP-TETRAFLUOROETH EX AERO: 1.0000 "application " | INHALATION_SPRAY | CUTANEOUS | Status: DC | PRN

## 2017-08-08 MED ORDER — DOCUSATE SODIUM 100 MG PO CAPS
100.0000 mg | ORAL_CAPSULE | Freq: Every day | ORAL | Status: DC | PRN
  Administered 2017-08-12: 100 mg via ORAL
  Filled 2017-08-08: qty 1

## 2017-08-08 MED ORDER — HYDROCODONE-ACETAMINOPHEN 5-325 MG PO TABS
ORAL_TABLET | ORAL | Status: AC
  Administered 2017-08-08: 1 via ORAL
  Filled 2017-08-08: qty 1

## 2017-08-08 MED ORDER — LIDOCAINE-PRILOCAINE 2.5-2.5 % EX CREA: 1.0000 "application " | TOPICAL_CREAM | CUTANEOUS | Status: DC | PRN

## 2017-08-08 MED ORDER — WARFARIN SODIUM 5 MG PO TABS
5.0000 mg | ORAL_TABLET | Freq: Once | ORAL | Status: AC
  Administered 2017-08-08: 5 mg via ORAL
  Filled 2017-08-08: qty 1

## 2017-08-08 NOTE — Evaluation (Signed)
Physical Therapy Evaluation Patient Details Name: Michael Ventresca MRN: [id number] DOB: 1946/07/16 Today's Date: 08/08/2017   History of Present Illness  Pt is a 71 y/o female admitted secondary to chest pain. Cardiology consulted and attributed chest pain to musculoskeletal (due to an accident earlier this year). PMH including but not limited to a-fib, CAD, CHF, ESRD on dialysis, DM, HTN, PVD and Bell's Palsy.  Clinical Impression  Pt presented supine in bed with HOB elevated, awake and willing to participate in therapy session. Prior to admission, pt reported that she was at [facility] for approximately one week and was admitted into the hospital and then d/c'd to [facility]. Pt stated that she was only at [facility] for a few days and then re-admitted to the hospital. Pt usually ambulates with use of a RW or rollator. Pt stated that she had a personal care attendant that assisted her with bathing. Pt currently limited secondary to fatigue as she had dialysis earlier today. Pt only able to tolerate bed mobility, transfers and taking a few side steps at the EOB. Pt would continue to benefit from skilled physical therapy services at this time while admitted and after d/c to address the below listed limitations in order to improve overall safety and independence with functional mobility.     Follow Up Recommendations SNF;Supervision/Assistance - 24 hour    Equipment Recommendations  None recommended by PT    Recommendations for Other Services       Precautions / Restrictions Precautions Precautions: Fall Restrictions Weight Bearing Restrictions: No      Mobility  Bed Mobility Overal bed mobility: Needs Assistance Bed Mobility: Supine to Sit;Sit to Supine     Supine to sit: Min guard Sit to supine: Min guard   General bed mobility comments: increased time and effort, use of bed rails, min guard for safety  Transfers Overall transfer level: Needs assistance Equipment used: Rolling  walker (2 wheeled) Transfers: Sit to/from Stand Sit to Stand: Min assist         General transfer comment: increased time, good technique, min A to power into standing from sitting EOB and for stability  Ambulation/Gait             General Gait Details: pt reported that she was too weak to ambulate after dialysis today; however, she was able to take 3-4 side steps at EOB towards her R   Stairs            Wheelchair Mobility    Modified Rankin (Stroke Patients Only)       Balance Overall balance assessment: Needs assistance Sitting-balance support: Feet supported Sitting balance-Leahy Scale: Good     Standing balance support: During functional activity;Bilateral upper extremity supported Standing balance-Leahy Scale: Poor Standing balance comment: reliant on bilateral UEs on RW                             Pertinent Vitals/Pain Pain Assessment: Faces Faces Pain Scale: Hurts little more Pain Location: sternum Pain Descriptors / Indicators: Sore Pain Intervention(s): Monitored during session;Repositioned    Home Living Family/patient expects to be discharged to:: Private residence Living Arrangements: Alone Available Help at Discharge: Family;Available PRN/intermittently Type of Home: House Home Access: Level entry     Home Layout: One level Home Equipment: Walker - 4 wheels;Cane - single point;Wheelchair - [employer] - 2 wheels;Shower seat Additional Comments: Pt has been at [facility] for the past few days since previous admission  Prior Function Level of Independence: Needs assistance   Gait / Transfers Assistance Needed: pt ambulates with use of RW or rollator  ADL's / Homemaking Assistance Needed: pt reported that she had a personal care attendant that came every other day for a couple of hours to assist her with bathing and light housework        Hand Dominance        Extremity/Trunk Assessment   Upper Extremity  Assessment Upper Extremity Assessment: Defer to OT evaluation    Lower Extremity Assessment Lower Extremity Assessment: Generalized weakness       Communication   Communication: No difficulties  Cognition Arousal/Alertness: Awake/alert Behavior During Therapy: WFL for tasks assessed/performed Overall Cognitive Status: Within Functional Limits for tasks assessed                                        General Comments      Exercises     Assessment/Plan    PT Assessment Patient needs continued PT services  PT Problem List Decreased strength;Decreased activity tolerance;Decreased balance;Decreased mobility;Decreased coordination;Decreased knowledge of use of DME;Decreased safety awareness;Decreased knowledge of precautions       PT Treatment Interventions DME instruction;Gait training;Functional mobility training;Therapeutic activities;Therapeutic exercise;Balance training;Patient/family education;Neuromuscular re-education    PT Goals (Current goals can be found in the Care Plan section)  Acute Rehab PT Goals Patient Stated Goal: to get stronger PT Goal Formulation: With patient Time For Goal Achievement: 08/22/17 Potential to Achieve Goals: Good    Frequency Min 3X/week   Barriers to discharge        Co-evaluation               AM-PAC PT "6 Clicks" Daily Activity  Outcome Measure Difficulty turning over in bed (including adjusting bedclothes, sheets and blankets)?: A Little Difficulty moving from lying on back to sitting on the side of the bed? : A Lot Difficulty sitting down on and standing up from a chair with arms (e.g., wheelchair, bedside commode, etc,.)?: Unable Help needed moving to and from a bed to chair (including a wheelchair)?: A Little Help needed walking in hospital room?: A Lot Help needed climbing 3-5 steps with a railing? : A Lot 6 Click Score: 13    End of Session Equipment Utilized During Treatment: Gait  belt;Oxygen Activity Tolerance: Patient limited by fatigue Patient left: in bed;with call bell/phone within reach Nurse Communication: Mobility status PT Visit Diagnosis: Other abnormalities of gait and mobility (R26.89);Muscle weakness (generalized) (M62.81)    Time: 9604-5409 PT Time Calculation (min) (ACUTE ONLY): 24 min   Charges:   PT Evaluation $PT Eval Moderate Complexity: 1 Mod PT Treatments $Therapeutic Activity: 8-22 mins   PT G Codes:        [place], PT, DPT [phone]   Alessandra Bevels Pacey Willadsen 08/08/2017, 3:21 PM

## 2017-08-08 NOTE — Progress Notes (Signed)
Patient ID: Colleen Mullins, female   DOB: Jul 09, 1946, 71 y.o.   MRN: [id number]  PROGRESS NOTE    Kadajah Kjos  YQM:[id number] DOB: 10-Aug-1946 DOA: 08/06/2017 PCP: Zoila Shutter, MD   Brief Narrative: 71 year old female with history of diabetes, peripheral vascular disease, hyperlipidemia, hypertension, glaucoma, Bell's palsy, GERD, end-stage renal disease on dialysis, CHF, diabetic neuropathy, coronary artery disease and recent hospital admission and discharge from [facility] on 08/01/2017 for pericardial effusion status post pericardiocentesis and new onset atrial fibrillation for which she was started on amiodarone orally but no anticoagulation because of pericardial effusion. She was admitted on 08/06/2017 4 complaints of chest pain and was found to have A. fib with rapid ventricular rate. Cardiology has already evaluated the patient. Patient was started on amiodarone drip which was changed to oral amiodarone. Oral anticoagulation was started as well. Nephrology consultation was also called  Assessment & Plan:   Active Problems:   Type 2 diabetes mellitus with renal complication (HCC)   Elevated troponin   ESRD on dialysis (HCC)   Hypertensive heart disease with congestive heart failure (HCC)   PVD (peripheral vascular disease) (HCC)   GERD (gastroesophageal reflux disease)   Pericardial effusion   Chest pain   Atrial fibrillation with RVR (HCC)  A. fib with RVR - Status post amiodarone drip. Currently on oral amiodarone. Heart rate much improved. Cardiology follow-up appreciated. Follow further recommendations from cardiology - Coumadin has been started as per cardiology recommendations. Follow INR. - Pericardial effusion noted during last admission with pericardiocentesis performed. Initial concern the patient's chest pain was related to recurrence of her pericardial effusion. Bedside echo performed showing EF of 65% severe concentric ventricular hypertrophy and no recurrence  of pericardial effusion.  Chest pain with elevated troponin - Probably secondary to above versus musculoskeletal. Still having some reproducible chest pain. We will treat with oral analgesics.  - Troponin did not increase significantly. - Patient has history of non obstructive coronary artery disease  Hypotension:  - Likely due to a combination of A. fib with RVR and fluid depletion secondary to dialysis.  - Resolved. - Blood pressure on the higher side. Restart labetalol and amlodipine  ESRD on hemodialysis - Nephrology following. - Dialysis per nephrology  DM: - SSI  Chronic pain: - continue neurontin, flexeril, norco  Generalized deconditioning: - PT/OT eval - Care management consult   DVT prophylaxis: SCDs; Coumadin Code Status:  Full Family Communication: None at bedside Disposition Plan: Home in 1-3 days  Consultants: Cardiology and nephrology  Procedures: Limited echo on 08/06/2017: Study Conclusions  - Left ventricle: The cavity size was normal. There was severe   concentric hypertrophy. Systolic function was vigorous. The   estimated ejection fraction was in the range of 65% to 70%. Wall   motion was normal; there were no regional wall motion   abnormalities. - Aortic valve: Trileaflet; mildly thickened, moderately calcified   leaflets. - Mitral valve: Calcified annulus. Mildly thickened leaflets . - Pericardium, extracardiac: There was no pericardial effusion.  Impressions:  - Compared to the prior study, there has been no significant   interval change.   Antimicrobials: None  Subjective: Patient seen and examined at bedside undergoing dialysis. She complains of intermittent chest pain getting worse with palpation. No Nausea vomiting or diarrhea.  Objective: Vitals:   08/08/17 1130 08/08/17 1140 08/08/17 1259 08/08/17 1303  BP: (!) 153/64 (!) 151/67 (!) 162/74 (!) 162/74  Pulse: 85 82 90 90  Resp: 18 18  20  Temp:  98.2 F (36.8 C)   98.1 F (36.7 C)  TempSrc:  Oral  Oral  SpO2: 100% 100%  97%  Weight:  73.3 kg (161 lb 9.6 oz)    Height:        Intake/Output Summary (Last 24 hours) at 08/08/17 1341 Last data filed at 08/08/17 1140  Gross per 24 hour  Intake              477 ml  Output             2398 ml  Net            -1921 ml   Filed Weights   08/06/17 2027 08/08/17 0730 08/08/17 1140  Weight: 74.3 kg (163 lb 12.8 oz) 76.9 kg (169 lb 8.5 oz) 73.3 kg (161 lb 9.6 oz)    Examination:  General exam: Appears calm and comfortable  Respiratory system: Bilateral decreased breath sound at bases With scattered crackles. Cardiovascular system: S1 & S2 heard, Rate controlled. Chest wall tenderness present Gastrointestinal system: Abdomen is nondistended, soft and nontender. Normal bowel sounds heard. Extremities: No cyanosis, clubbing; trace edema     Data Reviewed: I have personally reviewed following labs and imaging studies  CBC:  Recent Labs Lab 08/06/17 0952 08/07/17 0759 08/08/17 0758  WBC 15.3* 9.1 9.3  HGB 9.7* 8.5* 9.0*  HCT 31.0* 27.9* 28.8*  MCV 85.6 86.6 84.5  PLT 593* 591* 658*   Basic Metabolic Panel:  Recent Labs Lab 08/06/17 0952 08/06/17 1734 08/07/17 0759  NA 133*  --  132*  K 4.2  --  4.2  CL 91*  --  91*  CO2 29  --  27  GLUCOSE 261*  --  208*  BUN 23*  --  38*  CREATININE 4.43*  --  6.05*  CALCIUM 9.2  --  9.1  MG  --  2.1  --   PHOS  --  2.9  --    GFR: Estimated Creatinine Clearance: 8.6 mL/min (A) (by C-G formula based on SCr of 6.05 mg/dL (H)). Liver Function Tests:  Recent Labs Lab 08/07/17 0759  AST 30  ALT 15  ALKPHOS 87  BILITOT 0.5  PROT 6.1*  ALBUMIN 2.5*   No results for input(s): LIPASE, AMYLASE in the last 168 hours. No results for input(s): AMMONIA in the last 168 hours. Coagulation Profile:  Recent Labs Lab 08/07/17 1608 08/08/17 0758  INR 0.98 1.00   Cardiac Enzymes: No results for input(s): CKTOTAL, CKMB, CKMBINDEX, TROPONINI in  the last 168 hours. BNP (last 3 results) No results for input(s): PROBNP in the last 8760 hours. HbA1C: No results for input(s): HGBA1C in the last 72 hours. CBG:  Recent Labs Lab 08/07/17 1156 08/07/17 1459 08/07/17 1701 08/07/17 2048 08/08/17 1256  GLUCAP 257* 204* 233* 205* 118*   Lipid Profile: No results for input(s): CHOL, HDL, LDLCALC, TRIG, CHOLHDL, LDLDIRECT in the last 72 hours. Thyroid Function Tests: No results for input(s): TSH, T4TOTAL, FREET4, T3FREE, THYROIDAB in the last 72 hours. Anemia Panel: No results for input(s): VITAMINB12, FOLATE, FERRITIN, TIBC, IRON, RETICCTPCT in the last 72 hours. Sepsis Labs: No results for input(s): PROCALCITON, LATICACIDVEN in the last 168 hours.  No results found for this or any previous visit (from the past 240 hour(s)).       Radiology Studies: No results found.      Scheduled Meds: . amiodarone  400 mg Oral BID  . amLODipine  10 mg Oral QHS  .  aspirin EC  81 mg Oral QHS  . calcium acetate  1,334 mg Oral TID WC  . cinacalcet  60 mg Oral Q M,W,F  . darbepoetin (ARANESP) injection - DIALYSIS  200 mcg Intravenous Q Fri-HD  . famotidine  20 mg Oral QHS  . feeding supplement (NEPRO CARB STEADY)  237 mL Oral BID BM  . gabapentin  100 mg Oral TID  . insulin aspart  0-5 Units Subcutaneous QHS  . insulin aspart  0-9 Units Subcutaneous TID WC  . insulin glargine  7 Units Subcutaneous QHS  . labetalol  300 mg Oral BID  . latanoprost  1 drop Both Eyes QHS  . mouth rinse  15 mL Mouth Rinse BID  . multivitamin  1 tablet Oral QHS  . pantoprazole  80 mg Oral Q1200  . warfarin  5 mg Oral ONCE-1800  . Warfarin - Pharmacist Dosing Inpatient   Does not apply q1800   Continuous Infusions:    LOS: 2 days        Glade Lloyd, MD [facility] Pager [phone]  If 7PM-7AM, please contact night-coverage [email or web address] Password [email or web address] 08/08/2017, 1:41 PM

## 2017-08-08 NOTE — Progress Notes (Signed)
[facility] Progress Note   Subjective: Seen on HD, no C/Os.   Objective Vitals:   08/08/17 0730 08/08/17 0739 08/08/17 0800 08/08/17 0830  BP: (!) 172/81 (!) 173/78 (!) 165/83 (!) 163/83  Pulse: 79 78 79 81  Resp: (!) 22  Temp: 98.3 F (36.8 C)     TempSrc: Oral     SpO2: 100% 100% 100% 100%  Weight: 76.9 kg (169 lb 8.5 oz)     Height:       Physical Exam General: WNWD NAD Heart: S1,S2 RRR Lungs: CTAB A/P Abdomen: Active BS, Non-tender Extremities: No LE edema Dialysis Access: RUA AVG cannulated at present.   Additional Objective Labs: Basic Metabolic Panel:  Recent Labs Lab 08/01/17 0939 08/06/17 0952 08/06/17 1734 08/07/17 0759  NA 128* 133*  --  132*  K 4.9 4.2  --  4.2  CL 90* 91*  --  91*  CO2 24 29  --  27  GLUCOSE 247* 261*  --  208*  BUN 45* 23*  --  38*  CREATININE 6.34* 4.43*  --  6.05*  CALCIUM 9.1 9.2  --  9.1  PHOS 4.5  --  2.9  --    Liver Function Tests:  Recent Labs Lab 08/01/17 0939 08/07/17 0759  AST  --  30  ALT  --  15  ALKPHOS  --  87  BILITOT  --  0.5  PROT  --  6.1*  ALBUMIN 2.7* 2.5*   No results for input(s): LIPASE, AMYLASE in the last 168 hours. CBC:  Recent Labs Lab 08/01/17 0939 08/06/17 0952 08/07/17 0759 08/08/17 0758  WBC 13.8* 15.3* 9.1 9.3  HGB 9.5* 9.7* 8.5* 9.0*  HCT 30.4* 31.0* 27.9* 28.8*  MCV 84.7 85.6 86.6 84.5  PLT 512* 593* 591* 658*   Blood Culture    Component Value Date/Time   SDES BLOOD LEFT ARM 07/26/2017 1411   SDES BLOOD LEFT HAND 07/26/2017 1411   SPECREQUEST IN PEDIATRIC BOTTLE Blood Culture adequate volume 07/26/2017 1411   SPECREQUEST IN PEDIATRIC BOTTLE Blood Culture adequate volume 07/26/2017 1411   CULT NO GROWTH 5 DAYS 07/26/2017 1411   CULT NO GROWTH 5 DAYS 07/26/2017 1411   REPTSTATUS 07/31/2017 FINAL 07/26/2017 1411   REPTSTATUS 07/31/2017 FINAL 07/26/2017 1411    Cardiac Enzymes: No results for input(s): CKTOTAL, CKMB, CKMBINDEX, TROPONINI in  the last 168 hours. CBG:  Recent Labs Lab 08/07/17 1010 08/07/17 1156 08/07/17 1459 08/07/17 1701 08/07/17 2048  GLUCAP 242* 257* 204* 233* 205*   Iron Studies: No results for input(s): IRON, TIBC, TRANSFERRIN, FERRITIN in the last 72 hours. @ Studies/Results: Dg Chest Portable 1 View  Result Date: 08/06/2017 CLINICAL DATA:  Chest pain.  Renal failure.  Atrial fibrillation EXAM: PORTABLE CHEST 1 VIEW COMPARISON:  July 26, 2017 FINDINGS: There is atelectatic change in the left lung base region. There is no frank edema or consolidation. There is cardiomegaly. The pulmonary vascularity is within normal limits. There is aortic atherosclerosis. No evident bone lesions. No pneumothorax. IMPRESSION: Left base atelectasis. No frank edema or consolidation. Stable cardiac enlargement. There is aortic atherosclerosis. Aortic Atherosclerosis (ICD10-I70.0). Electronically Signed   By: Bretta Bang III M.D.   On: 08/06/2017 10:16   Medications: . sodium chloride    . sodium chloride     . amiodarone  400 mg Oral BID  . amLODipine  10 mg Oral QHS  . aspirin EC  81 mg Oral QHS  . calcium  acetate  1,334 mg Oral TID WC  . cinacalcet  60 mg Oral Q M,W,F  . darbepoetin (ARANESP) injection - DIALYSIS  200 mcg Intravenous Q Fri-HD  . famotidine  20 mg Oral QHS  . feeding supplement (NEPRO CARB STEADY)  237 mL Oral BID BM  . gabapentin  100 mg Oral TID  . insulin aspart  0-5 Units Subcutaneous QHS  . insulin aspart  0-9 Units Subcutaneous TID WC  . insulin glargine  7 Units Subcutaneous QHS  . labetalol  300 mg Oral BID  . latanoprost  1 drop Both Eyes QHS  . mouth rinse  15 mL Mouth Rinse BID  . multivitamin  1 tablet Oral QHS  . pantoprazole  80 mg Oral Q1200  . warfarin  5 mg Oral ONCE-1800  . Warfarin - Pharmacist Dosing Inpatient   Does not apply q1800   HD orders: [facility] MWF 4 hrs 180 NRe 400/Auto 1.5 71.5 kg 2.0 K/ 2.25 Ca UF profile 2 RUA AVG -Heparin:  NONE -Mircera 100 mcg IV Q 2 weeks (last dose 07/16/17) -Hectorol 1 mcg IV TIW  BMD meds:  -Calicium Acetate 667 mg 2 caps PO TID AC 1 w/snack -Sensipar 60 mg PO MWF  Assessment/Plan: 1. Chest Pain: Troponins flat trend-normal LVF-no pericardial effusion. Cards following 2. Afib RVR: Now SR 80s on monitor. CHADSVSC-5. Starting coumadin per pharmacy, oral Amiodarone.  3. H/O cardiac tamponade/pericardial effusion. No pericardial effusion on echo.  4. ESRD -MWF HD today on schedule. Labs pending.  5. Anemia -HGB 9.0 For ESA dose today.  6. Secondary hyperparathyroidism - Cont binders, VDRA, Sensipar. Phos 2.9 on adm. Decrease ca acetate.  7. HTN/volume - Hypotensive on admit. Antihypertensive meds held. Amlodipine has been resumed. Pre wt 76.9 kg UFG 3.0 currently 5.4 above OP EDW. Attempt to lower EDW. BP elevated.  8. Nutrition - Albumin 2.5 renal carb mod diet. Add prostat 9. DM: per primary  Rita H. Brown NP-C 08/08/2017, 8:49 AM  [facility] [phone]  Pt seen, examined and agree w A/P as above.  Vinson Moselle MD [employer] pager [phone]   08/08/2017, 11:56 AM

## 2017-08-08 NOTE — Progress Notes (Signed)
ANTICOAGULATION CONSULT NOTE - Initial Consult  Pharmacy Consult for atrial fibrillation Indication: atrial fibrillation  Allergies  Allergen Reactions  . Iodinated Diagnostic Agents Hives and Itching  . Lactose Intolerance (Gi) Other (See Comments)    Abdominal pains and bloating    Patient Measurements: Height:  (167.6 cm) Weight: 163 lb 12.8 oz (74.3 kg) IBW/kg (Calculated) : 59.3  Assessment: 71 yof admitted with recent diagnosis of atrial fibrillation; however, no anticoagulation prior to admission due to pericardial effusion during hospitalization early this month. No evidence of pericardial effusion this admission.  Now on Coumadin for Afib. Discussed with Cardiology and they do not want any bridge. Coumadin started on 9/13. INR 1.0 today. Hgb 9.0, plts 658. Has had some slight bleeding from HD sites.  Goal of Therapy:  INR 2-3 Monitor platelets by anticoagulation protocol: Yes   Plan:  Give Coumadin  PO x 1 Monitor daily INR, CBC, s/s of bleed Monitor for DDI with amiodarone  Enzo Bi, PharmD, [facility] Clinical Pharmacist Pager [phone] 08/08/2017 8:34 AM

## 2017-08-08 NOTE — Progress Notes (Signed)
Progress Note  Patient Name: Colleen Mullins Date of Encounter: 08/08/2017  Primary Cardiologist: Dr. Mayford Knife  Subjective   Complains of chronic chest wall pain but no other complaints  Inpatient Medications    Scheduled Meds: . amiodarone  400 mg Oral BID  . amLODipine  10 mg Oral QHS  . aspirin EC  81 mg Oral QHS  . calcium acetate  1,334 mg Oral TID WC  . cinacalcet  60 mg Oral Q M,W,F  . darbepoetin (ARANESP) injection - DIALYSIS  200 mcg Intravenous Q Fri-HD  . famotidine  20 mg Oral QHS  . feeding supplement (NEPRO CARB STEADY)  237 mL Oral BID BM  . gabapentin  100 mg Oral TID  . ibuprofen  400 mg Oral TID  . insulin aspart  0-5 Units Subcutaneous QHS  . insulin aspart  0-9 Units Subcutaneous TID WC  . insulin glargine  7 Units Subcutaneous QHS  . labetalol  300 mg Oral BID  . latanoprost  1 drop Both Eyes QHS  . mouth rinse  15 mL Mouth Rinse BID  . multivitamin  1 tablet Oral QHS  . pantoprazole  80 mg Oral Q1200  . warfarin  5 mg Oral ONCE-1800  . Warfarin - Pharmacist Dosing Inpatient   Does not apply q1800   Continuous Infusions:  PRN Meds: acetaminophen **OR** acetaminophen, calcium acetate, cyclobenzaprine, HYDROcodone-acetaminophen, ondansetron **OR** ondansetron (ZOFRAN) IV   Vital Signs    Vitals:   08/08/17 1130 08/08/17 1140 08/08/17 1259 08/08/17 1303  BP: (!) 153/64 (!) 151/67 (!) 162/74 (!) 162/74  Pulse: 85 82 90 90  Resp: Temp:  98.2 F (36.8 C)  98.1 F (36.7 C)  TempSrc:  Oral  Oral  SpO2: 100% 100%  97%  Weight:  161 lb 9.6 oz (73.3 kg)    Height:        Intake/Output Summary (Last 24 hours) at 08/08/17 1506 Last data filed at 08/08/17 1140  Gross per 24 hour  Intake              477 ml  Output             2398 ml  Net            -1921 ml   Filed Weights   08/06/17 2027 08/08/17 0730 08/08/17 1140  Weight: 163 lb 12.8 oz (74.3 kg) 169 lb 8.5 oz (76.9 kg) 161 lb 9.6 oz (73.3 kg)    Telemetry    NSR - Personally  Reviewed  ECG    No new EKG to review - Personally Reviewed  Physical Exam   GEN: No acute distress.   Neck: No JVD Cardiac: RRR, no murmurs, rubs, or gallops.  Respiratory: Clear to auscultation bilaterally. GI: Soft, nontender, non-distended  MS: No edema; No deformity. Neuro:  Nonfocal  Psych: Normal affect   Labs    Chemistry Recent Labs Lab 08/06/17 0952 08/07/17 0759  NA 133* 132*  K 4.2 4.2  CL 91* 91*  CO2 29 27  GLUCOSE 261* 208*  BUN 23* 38*  CREATININE 4.43* 6.05*  CALCIUM 9.2 9.1  PROT  --  6.1*  ALBUMIN  --  2.5*  AST  --  30  ALT  --  15  ALKPHOS  --  87  BILITOT  --  0.5  GFRNONAA 9* 6*  GFRAA 11* 7*  ANIONGAP 13 14     Hematology Recent Labs Lab 08/06/17 201 835 1182 08/07/17  0759 08/08/17 0758  WBC 15.3* 9.1 9.3  RBC 3.62* 3.22* 3.41*  HGB 9.7* 8.5* 9.0*  HCT 31.0* 27.9* 28.8*  MCV 85.6 86.6 84.5  MCH 26.8 26.4 26.4  MCHC 31.3 30.5 31.3  RDW 15.9* 16.2* 16.0*  PLT 593* 591* 658*    Cardiac EnzymesNo results for input(s): TROPONINI in the last 168 hours.  Recent Labs Lab 08/06/17 0954 08/06/17 1303  TROPIPOC 0.18* 0.16*     BNPNo results for input(s): BNP, PROBNP in the last 168 hours.   DDimer No results for input(s): DDIMER in the last 168 hours.   Radiology    No results found.  Cardiac Studies   2D echo 07/2017 Study Conclusions  - Left ventricle: The cavity size was normal. There was severe concentric hypertrophy. Systolic function was vigorous. The estimated ejection fraction was in the range of 65% to 70%. Wall motion was normal; there were no regional wall motion abnormalities. - Aortic valve: Trileaflet; mildly thickened, moderately calcified leaflets. - Mitral valve: Calcified annulus. Mildly thickened leaflets . - Pericardium, extracardiac: There was no pericardial effusion.  Impressions: - Compared to the prior study, there has been no significant interval change.    Patient Profile      71 y.o. female with a hx of ASCAD with cath showing 60% D1 and 50% PL off the RCA by cath 06/2016 on medical management, DM, ESRD on HD (M/W/F), GERD, HTN and hyperlipidemiawho is being seen today for the evaluation of chest pain  Assessment & Plan    Chest Pain:pain is very atypical and identical to the CP she has been having and is reproducible with palpation of her chest wall and has been present since her accident -- c/w MSK chest wall pain and reproducible with palpation -- Trop mildly elevated with flat trend (0.18>0.16) and likely related to demand ischemia in the setting of afib with RVR and underlying ESRD.Marland Kitchen -- 2D echo showed normal LVF with no pericardial effusion.  EF 65-70% with no wall motion abnormality  Atrial Fibrillation CHADSVSC 5:She has previously not been a candidate for anticoagulation because of her risk of hemorrhagic pericardial effusion but this has resolved on last 2 echos.  -- she is now back in NSR after starting IV Amio.   -- continue  Amio  BID load -- loading Coumadin per pharmacy for [facility] score of  6 without IV heparin bridge due to recent pericardia effusion  Hypotension -- bedside echo initially on admission indicated hyperdynamic LVF with cavity obliteration and outflow obstruction and it was felt that she had hypovolemic hypotension worsened by rapid afib.   -- hypotension has resolved with IVF resuscitation and actually BP high now.    HTN: Per Nephrology, hypotension resolved and now BP high -- restarting labetolol and amlodipine  ESRD: Per nephrology  DM: Per primary team  For questions or updates, please contact [facility] Please consult [email or web address] for contact info under Cardiology/STEMI.      Signed, Armanda Magic, MD  08/08/2017, 3:06 PM

## 2017-08-09 DIAGNOSIS — I110 Hypertensive heart disease with heart failure: Secondary | ICD-10-CM

## 2017-08-09 DIAGNOSIS — Z992 Dependence on renal dialysis: Secondary | ICD-10-CM

## 2017-08-09 DIAGNOSIS — I5032 Chronic diastolic (congestive) heart failure: Secondary | ICD-10-CM

## 2017-08-09 DIAGNOSIS — E1122 Type 2 diabetes mellitus with diabetic chronic kidney disease: Secondary | ICD-10-CM

## 2017-08-09 LAB — CBC
HEMATOCRIT: 28.1 % — AB (ref 36.0–46.0)
Hemoglobin: 8.6 g/dL — ABNORMAL LOW (ref 12.0–15.0)
MCH: 26.3 pg (ref 26.0–34.0)
MCHC: 30.6 g/dL (ref 30.0–36.0)
MCV: 85.9 fL (ref 78.0–100.0)
PLATELETS: 630 10*3/uL — AB (ref 150–400)
RBC: 3.27 MIL/uL — ABNORMAL LOW (ref 3.87–5.11)
RDW: 16.2 % — AB (ref 11.5–15.5)
WBC: 10.3 10*3/uL (ref 4.0–10.5)

## 2017-08-09 LAB — MAGNESIUM: MAGNESIUM: 2 mg/dL (ref 1.7–2.4)

## 2017-08-09 LAB — GLUCOSE, CAPILLARY
GLUCOSE-CAPILLARY: 219 mg/dL — AB (ref 65–99)
Glucose-Capillary: 140 mg/dL — ABNORMAL HIGH (ref 65–99)
Glucose-Capillary: 123 mg/dL — ABNORMAL HIGH (ref 65–99)
Glucose-Capillary: 180 mg/dL — ABNORMAL HIGH (ref 65–99)

## 2017-08-09 LAB — BASIC METABOLIC PANEL
Anion gap: 13 (ref 5–15)
BUN: 23 mg/dL — ABNORMAL HIGH (ref 6–20)
CALCIUM: 8.7 mg/dL — AB (ref 8.9–10.3)
CO2: 25 mmol/L (ref 22–32)
CREATININE: 4.37 mg/dL — AB (ref 0.44–1.00)
Chloride: 92 mmol/L — ABNORMAL LOW (ref 101–111)
GFR calc Af Amer: 11 mL/min — ABNORMAL LOW (ref >60)
GFR, EST NON AFRICAN AMERICAN: 9 mL/min — AB (ref >60)
GLUCOSE: 136 mg/dL — AB (ref 65–99)
Potassium: 3.8 mmol/L (ref 3.5–5.1)
SODIUM: 130 mmol/L — AB (ref 135–145)

## 2017-08-09 LAB — PROTIME-INR
INR: 0.98
PROTHROMBIN TIME: 12.9 s (ref 11.4–15.2)

## 2017-08-09 MED ORDER — WARFARIN SODIUM 5 MG PO TABS
5.0000 mg | ORAL_TABLET | Freq: Once | ORAL | Status: AC
  Administered 2017-08-09: 5 mg via ORAL
  Filled 2017-08-09: qty 1

## 2017-08-09 NOTE — Progress Notes (Signed)
[facility] Progress Note  Subjective: multiple c/o, CP, SOB , nausea. Daughter at bedside  Vitals:   08/08/17 1932 08/08/17 2023 08/09/17 0526 08/09/17 1239  BP: (!) 140/59 (!) 151/67 (!) 141/64 (!) 129/46  Pulse: 76 75 69 63  Resp: Temp: 98.4 F (36.9 C) 97.8 F (36.6 C) 97.7 F (36.5 C) 98.4 F (36.9 C)  TempSrc: Oral Oral Oral Oral  SpO2: 100% 95% 94% 90%  Weight:  70.4 kg (155 lb 3.3 oz) 74.6 kg (164 lb 8 oz)   Height:   (1.676 m)      Inpatient medications: . amiodarone  400 mg Oral BID  . amLODipine  10 mg Oral QHS  . aspirin EC  81 mg Oral QHS  . calcium acetate  1,334 mg Oral TID WC  . cinacalcet  60 mg Oral Q M,W,F  . darbepoetin (ARANESP) injection - DIALYSIS  200 mcg Intravenous Q Fri-HD  . famotidine  20 mg Oral QHS  . feeding supplement (NEPRO CARB STEADY)  237 mL Oral BID BM  . gabapentin  100 mg Oral TID  . ibuprofen  400 mg Oral TID  . insulin aspart  0-5 Units Subcutaneous QHS  . insulin aspart  0-9 Units Subcutaneous TID WC  . insulin glargine  7 Units Subcutaneous QHS  . labetalol  300 mg Oral BID  . latanoprost  1 drop Both Eyes QHS  . mouth rinse  15 mL Mouth Rinse BID  . multivitamin  1 tablet Oral QHS  . pantoprazole  80 mg Oral Q1200  . Warfarin - Pharmacist Dosing Inpatient   Does not apply q1800    acetaminophen **OR** acetaminophen, calcium acetate, cyclobenzaprine, docusate sodium, HYDROcodone-acetaminophen, ondansetron **OR** ondansetron (ZOFRAN) IV  Exam: Alert, no distress No jvd Chest cta bilat RRR no RG Abd soft ntnd +bs Ext no edema RUA AVG  Dialysis: [facility] MWF 4h   71.5kg   Hep none  RUA AVG -Mircera 100 mcg IV Q 2 weeks (last dose 07/16/17) -Hectorol 1 mcg IV TIW -Calicium Acetate 667 mg 2 caps PO TID AC 1 w/snack -Sensipar 60 mg PO MWF      Impression: 1  Chest pain - trop's flat, normal ECHO 2  Afib RVR - started on coumadin, po amio 3  Recent cardiac tamponade/ effusion  4  ESRD on  HD 5  Anemia max darbe q fri 6  HTN/ vol - no vol excess; not sure what she was taking at [facility] no paper record in chart, has 5 BP meds listed but doubt she was on all of them.  Continue norvasc/ labetalol for now.  7  Dispo - possible dc back to SNF soon  Plan - HD Monday if still here, UF to edw   Vinson Moselle MD [facility] pager [phone]   08/09/2017, 4:48 PM    Recent Labs Lab 08/06/17 0952 08/06/17 1734 08/07/17 0759 08/09/17 0546  NA 133*  --  132* 130*  K 4.2  --  4.2 3.8  CL 91*  --  91* 92*  CO2 29  --  27 25  GLUCOSE 261*  --  208* 136*  BUN 23*  --  38* 23*  CREATININE 4.43*  --  6.05* 4.37*  CALCIUM 9.2  --  9.1 8.7*  PHOS  --  2.9  --   --     Recent Labs Lab 08/07/17 0759  AST 30  ALT 15  ALKPHOS 87  BILITOT 0.5  PROT 6.1*  ALBUMIN 2.5*    Recent Labs Lab 08/07/17 0759 08/08/17 0758 08/09/17 0546  WBC 9.1 9.3 10.3  HGB 8.5* 9.0* 8.6*  HCT 27.9* 28.8* 28.1*  MCV 86.6 84.5 85.9  PLT 591* 658* 630*   Iron/TIBC/Ferritin/ %Sat    Component Value Date/Time   IRON 53 08/17/2010 0330   TIBC 359 08/17/2010 0330   IRONPCTSAT 15 (L) 08/17/2010 0330

## 2017-08-09 NOTE — Progress Notes (Signed)
Progress Note  Patient Name: Colleen Mullins Date of Encounter: 08/09/2017  Primary Cardiologist: Mayford Knife  Subjective   Chest pain is clearly associated with movement today. Feels better overall.  Inpatient Medications    Scheduled Meds: . amiodarone  400 mg Oral BID  . amLODipine  10 mg Oral QHS  . aspirin EC  81 mg Oral QHS  . calcium acetate  1,334 mg Oral TID WC  . cinacalcet  60 mg Oral Q M,W,F  . darbepoetin (ARANESP) injection - DIALYSIS  200 mcg Intravenous Q Fri-HD  . famotidine  20 mg Oral QHS  . feeding supplement (NEPRO CARB STEADY)  237 mL Oral BID BM  . gabapentin  100 mg Oral TID  . ibuprofen  400 mg Oral TID  . insulin aspart  0-5 Units Subcutaneous QHS  . insulin aspart  0-9 Units Subcutaneous TID WC  . insulin glargine  7 Units Subcutaneous QHS  . labetalol  300 mg Oral BID  . latanoprost  1 drop Both Eyes QHS  . mouth rinse  15 mL Mouth Rinse BID  . multivitamin  1 tablet Oral QHS  . pantoprazole  80 mg Oral Q1200  . warfarin  5 mg Oral ONCE-1800  . Warfarin - Pharmacist Dosing Inpatient   Does not apply q1800   Continuous Infusions:  PRN Meds: acetaminophen **OR** acetaminophen, calcium acetate, cyclobenzaprine, docusate sodium, HYDROcodone-acetaminophen, ondansetron **OR** ondansetron (ZOFRAN) IV   Vital Signs    Vitals:   08/08/17 1932 08/08/17 2023 08/09/17 0526 08/09/17 1239  BP: (!) 140/59 (!) 151/67 (!) 141/64 (!) 129/46  Pulse: 76 75 69 63  Resp: Temp: 98.4 F (36.9 C) 97.8 F (36.6 C) 97.7 F (36.5 C) 98.4 F (36.9 C)  TempSrc: Oral Oral Oral Oral  SpO2: 100% 95% 94% 90%  Weight:  155 lb 3.3 oz (70.4 kg) 164 lb 8 oz (74.6 kg)   Height:   (1.676 m)      Intake/Output Summary (Last 24 hours) at 08/09/17 1320 Last data filed at 08/09/17 0900  Gross per 24 hour  Intake              360 ml  Output                0 ml  Net              360 ml   Filed Weights   08/08/17 1140 08/08/17 2023 08/09/17 0526  Weight:  161 lb 9.6 oz (73.3 kg) 155 lb 3.3 oz (70.4 kg) 164 lb 8 oz (74.6 kg)    Telemetry    NSR - Personally Reviewed  ECG    No new tracing - Personally Reviewed  Physical Exam  Thin, relaxed GEN: No acute distress.   Neck: No JVD Cardiac: RRR, no murmurs, rubs, or gallops.  Respiratory: Clear to auscultation bilaterally. GI: Soft, nontender, non-distended  MS: No edema; No deformity. Neuro:  Nonfocal  Psych: Normal affect   Labs    Chemistry Recent Labs Lab 08/06/17 0952 08/07/17 0759 08/09/17 0546  NA 133* 132* 130*  K 4.2 4.2 3.8  CL 91* 91* 92*  CO2 GLUCOSE 261* 208* 136*  BUN 23* 38* 23*  CREATININE 4.43* 6.05* 4.37*  CALCIUM 9.2 9.1 8.7*  PROT  --  6.1*  --   ALBUMIN  --  2.5*  --   AST  --  30  --   ALT  --  15  --   ALKPHOS  --  87  --   BILITOT  --  0.5  --   GFRNONAA 9* 6* 9*  GFRAA 11* 7* 11*  ANIONGAP Hematology Recent Labs Lab 08/07/17 0759 08/08/17 0758 08/09/17 0546  WBC 9.1 9.3 10.3  RBC 3.22* 3.41* 3.27*  HGB 8.5* 9.0* 8.6*  HCT 27.9* 28.8* 28.1*  MCV 86.6 84.5 85.9  MCH 26.4 26.4 26.3  MCHC 30.5 31.3 30.6  RDW 16.2* 16.0* 16.2*  PLT 591* 658* 630*    Cardiac EnzymesNo results for input(s): TROPONINI in the last 168 hours.  Recent Labs Lab 08/06/17 0954 08/06/17 1303  TROPIPOC 0.18* 0.16*     BNPNo results for input(s): BNP, PROBNP in the last 168 hours.   DDimer No results for input(s): DDIMER in the last 168 hours.   Radiology    No results found.  Cardiac Studies   - Left ventricle: The cavity size was normal. There was severe concentric hypertrophy. Systolic function was vigorous. The estimated ejection fraction was in the range of 65% to 70%. Wall motion was normal; there were no regional wall motion abnormalities. - Aortic valve: Trileaflet; mildly thickened, moderately calcified leaflets. - Mitral valve: Calcified annulus. Mildly thickened leaflets . - Pericardium,  extracardiac: There was no pericardial effusion.  Impressions: - Compared to the prior study, there has been no significant interval change.  Patient Profile     71 y.o. female with a hx of recent pericardiocentesis for large effusion, moderate CAD on medical management, DM, ESRD on HD (M/W/F), GERD, HTN and hyperlipidemiawho presented with chest pain, atrial fibrillation with RVR and hypotension following dialysis  Assessment & Plan    1. Chest pain: appears to be musculoskeletal. 2. Hypotension: due to hypovolemia with HD, resolved. Her murmur (due to hyperdynamic LV and dynamic obstruction) has also resolved. 3. HTN: control is fair 4. AFib: started on warfarin, INR 0.98. She has not been on anticoagulation due to concern about her pericarditis. Not critical to reach therapeutic INR before DC. In NSR since 08/06/2017. Change to Amiodarone 400 mg daily on 08/14/2017, then 200 mg daily on 08/29/2017. 5. Anemia of renal disease, stable Hgb 8.5-9.5 6. ESRD on HD  For questions or updates, please contact [facility] Please consult [email or web address] for contact info under Cardiology/STEMI.      Signed, Thurmon Fair, MD  08/09/2017, 1:20 PM

## 2017-08-09 NOTE — Progress Notes (Signed)
Patient ID: Colleen Mullins, female   DOB: [birth year], 71 y.o.   MRN: [id number]  PROGRESS NOTE    Colleen Mullins  GNF:[id number] DOB: October 20, 1946 DOA: 08/06/2017 PCP: Zoila Shutter, MD   Brief Narrative: 71 year old female with history of diabetes, peripheral vascular disease, hyperlipidemia, hypertension, glaucoma, Bell's palsy, GERD, end-stage renal disease on dialysis, CHF, diabetic neuropathy, coronary artery disease and recent hospital admission and discharge from [facility] on 08/01/2017 for pericardial effusion status post pericardiocentesis and new onset atrial fibrillation for which she was started on amiodarone orally but no anticoagulation because of pericardial effusion. She was admitted on 08/06/2017 4 complaints of chest pain and was found to have A. fib with rapid ventricular rate. Cardiology has already evaluated the patient. Patient was started on amiodarone drip which was changed to oral amiodarone. Oral anticoagulation was started as well. Nephrology consultation was also called.   Assessment & Plan:   Active Problems:   Type 2 diabetes mellitus with renal complication (HCC)   Elevated troponin   ESRD on dialysis (HCC)   Hypertensive heart disease with congestive heart failure (HCC)   PVD (peripheral vascular disease) (HCC)   GERD (gastroesophageal reflux disease)   Pericardial effusion   Chest pain   Atrial fibrillation with RVR (HCC)  A. fib with RVR - Status post amiodarone drip. Currently on oral amiodarone. Heart rate much improved. Cardiology follow-up appreciated.  - Coumadin has been started as per cardiology recommendations. Follow INR. - Pericardial effusion noted during last admission with pericardiocentesis performed. Initial concern the patient's chest pain was related to recurrence of her pericardial effusion. Bedside echo performed showing EF of 65% severe concentric ventricular hypertrophy and no recurrence of pericardial effusion.  Chest pain with  elevated troponin - Probably secondary to above versus musculoskeletal. Still having some reproducible chest pain. Continue oral analgesics - Troponin did not increase significantly. - Patient has history of non obstructive coronary artery disease  Hypotension:  - Likely due to a combination of A. fib with RVR and fluid depletion secondary to dialysis.  - Resolved. - Blood pressure on the higher side. Continue labetalol and amlodipine and if blood pressure still elevated, will resume other home antihypertensives.  ESRD on hemodialysis - Nephrology following. - Dialysis per nephrology  DM: - SSI  Chronic pain: - continue neurontin, flexeril, norco  Generalized deconditioning: - PT recommended SNF placement. Consult social worker   DVT prophylaxis: SCDs; Coumadin Code Status:  Full Family Communication: None at bedside Disposition Plan: SNF once bed is available  Consultants: Cardiology and nephrology  Procedures: Limited echo on 08/06/2017: Study Conclusions  - Left ventricle: The cavity size was normal. There was severe   concentric hypertrophy. Systolic function was vigorous. The   estimated ejection fraction was in the range of 65% to 70%. Wall   motion was normal; there were no regional wall motion   abnormalities. - Aortic valve: Trileaflet; mildly thickened, moderately calcified   leaflets. - Mitral valve: Calcified annulus. Mildly thickened leaflets . - Pericardium, extracardiac: There was no pericardial effusion.  Impressions:  - Compared to the prior study, there has been no significant   interval change.   Antimicrobials: None  Subjective: Patient seen and examined at bedside undergoing dialysis. She complains of intermittent chest pain getting worse with palpation. No overnight fever, Nausea vomiting or diarrhea.  Objective: Vitals:   08/08/17 1554 08/08/17 1932 08/08/17 2023 08/09/17 0526  BP:  (!) 140/59 (!) 151/67 (!) 141/64  Pulse:   76 75  69  Resp:  Temp: (!) 97.5 F (36.4 C) 98.4 F (36.9 C) 97.8 F (36.6 C) 97.7 F (36.5 C)  TempSrc: Oral Oral Oral Oral  SpO2:  100% 95% 94%  Weight:   70.4 kg (155 lb 3.3 oz) 74.6 kg (164 lb 8 oz)  Height:    (1.676 m)     Intake/Output Summary (Last 24 hours) at 08/09/17 1119 Last data filed at 08/09/17 0900  Gross per 24 hour  Intake              360 ml  Output             2398 ml  Net            -2038 ml   Filed Weights   08/08/17 1140 08/08/17 2023 08/09/17 0526  Weight: 73.3 kg (161 lb 9.6 oz) 70.4 kg (155 lb 3.3 oz) 74.6 kg (164 lb 8 oz)    Examination:  General exam: Appears calm and comfortable; Sleepy but wakes up and answers questions Respiratory system: Bilateral decreased breath sound at bases With scattered crackles. Cardiovascular system: S1 & S2 heard, Rate controlled. Chest wall tenderness present Gastrointestinal system: Abdomen is nondistended, soft and nontender. Normal bowel sounds heard. Extremities: No cyanosis, clubbing; trace edema     Data Reviewed: I have personally reviewed following labs and imaging studies  CBC:  Recent Labs Lab 08/06/17 0952 08/07/17 0759 08/08/17 0758 08/09/17 0546  WBC 15.3* 9.1 9.3 10.3  HGB 9.7* 8.5* 9.0* 8.6*  HCT 31.0* 27.9* 28.8* 28.1*  MCV 85.6 86.6 84.5 85.9  PLT 593* 591* 658* 630*   Basic Metabolic Panel:  Recent Labs Lab 08/06/17 0952 08/06/17 1734 08/07/17 0759 08/09/17 0546  NA 133*  --  132* 130*  K 4.2  --  4.2 3.8  CL 91*  --  91* 92*  CO2 29  --  27 25  GLUCOSE 261*  --  208* 136*  BUN 23*  --  38* 23*  CREATININE 4.43*  --  6.05* 4.37*  CALCIUM 9.2  --  9.1 8.7*  MG  --  2.1  --  2.0  PHOS  --  2.9  --   --    GFR: Estimated Creatinine Clearance: 12 mL/min (A) (by C-G formula based on SCr of 4.37 mg/dL (H)). Liver Function Tests:  Recent Labs Lab 08/07/17 0759  AST 30  ALT 15  ALKPHOS 87  BILITOT 0.5  PROT 6.1*  ALBUMIN 2.5*   No results for  input(s): LIPASE, AMYLASE in the last 168 hours. No results for input(s): AMMONIA in the last 168 hours. Coagulation Profile:  Recent Labs Lab 08/07/17 1608 08/08/17 0758 08/09/17 0546  INR 0.98 1.00 0.98   Cardiac Enzymes: No results for input(s): CKTOTAL, CKMB, CKMBINDEX, TROPONINI in the last 168 hours. BNP (last 3 results) No results for input(s): PROBNP in the last 8760 hours. HbA1C: No results for input(s): HGBA1C in the last 72 hours. CBG:  Recent Labs Lab 08/07/17 2048 08/08/17 1256 08/08/17 1552 08/08/17 2102 08/09/17 0738  GLUCAP 205* 118* 208* 215* 140*   Lipid Profile: No results for input(s): CHOL, HDL, LDLCALC, TRIG, CHOLHDL, LDLDIRECT in the last 72 hours. Thyroid Function Tests: No results for input(s): TSH, T4TOTAL, FREET4, T3FREE, THYROIDAB in the last 72 hours. Anemia Panel: No results for input(s): VITAMINB12, FOLATE, FERRITIN, TIBC, IRON, RETICCTPCT in the last 72 hours. Sepsis Labs: No results for input(s): PROCALCITON, LATICACIDVEN in the  last 168 hours.  No results found for this or any previous visit (from the past 240 hour(s)).       Radiology Studies: No results found.      Scheduled Meds: . amiodarone  400 mg Oral BID  . amLODipine  10 mg Oral QHS  . aspirin EC  81 mg Oral QHS  . calcium acetate  1,334 mg Oral TID WC  . cinacalcet  60 mg Oral Q M,W,F  . darbepoetin (ARANESP) injection - DIALYSIS  200 mcg Intravenous Q Fri-HD  . famotidine  20 mg Oral QHS  . feeding supplement (NEPRO CARB STEADY)  237 mL Oral BID BM  . gabapentin  100 mg Oral TID  . ibuprofen  400 mg Oral TID  . insulin aspart  0-5 Units Subcutaneous QHS  . insulin aspart  0-9 Units Subcutaneous TID WC  . insulin glargine  7 Units Subcutaneous QHS  . labetalol  300 mg Oral BID  . latanoprost  1 drop Both Eyes QHS  . mouth rinse  15 mL Mouth Rinse BID  . multivitamin  1 tablet Oral QHS  . pantoprazole  80 mg Oral Q1200  . Warfarin - Pharmacist Dosing  Inpatient   Does not apply q1800   Continuous Infusions:    LOS: 3 days        Glade Lloyd, MD [facility] Pager [phone]  If 7PM-7AM, please contact night-coverage [email or web address] Password [email or web address] 08/09/2017, 11:19 AM

## 2017-08-09 NOTE — Evaluation (Signed)
Occupational Therapy Evaluation Patient Details Name: Colleen Mullins MRN: [id number] DOB: 09-May-1946 Today's Date: 08/09/2017    History of Present Illness Pt is a 71 y/o female admitted secondary to chest pain. Cardiology consulted and attributed chest pain to musculoskeletal (due to an accident earlier this year). PMH including but not limited to a-fib, CAD, CHF, ESRD on dialysis, DM, HTN, PVD and Bell's Palsy.   Clinical Impression   Prior to current admission, pt was at [facility] for rehabilitation for a few days following D/C from previous admission. Pt reports that she is feeling much better today. She currently requires min guard assist for ambulating toilet transfers and LB ADL tasks. She presents with generalized weakness, B knee pain, decreased activity tolerance for ADL, and decreased balance impacting her ability to participate in ADL tasks safely. She would benefit from continued OT services while admitted to improve independence and safety with ADL and functional mobility. Recommend return to SNF level rehabilitation once medically ready for D/C.    Follow Up Recommendations  SNF;Supervision/Assistance - 24 hour    Equipment Recommendations  Other (comment) (TBD at next venue of care)    Recommendations for Other Services       Precautions / Restrictions Precautions Precautions: Fall Restrictions Weight Bearing Restrictions: No      Mobility Bed Mobility Overal bed mobility: Needs Assistance             General bed mobility comments: OT left room when pt supine in bed to retrieve chair alarm pad and chair linens and on return pt seated at EOB. Educated pt on need for assistance for safety with mobility.   Transfers Overall transfer level: Needs assistance Equipment used: Rolling walker (2 wheeled) Transfers: Sit to/from Stand Sit to Stand: Min assist         General transfer comment: Min guard assist for stability and safety. Pt with knee pain and weakness  in B LE making her unstable.     Balance Overall balance assessment: Needs assistance Sitting-balance support: Feet supported Sitting balance-Leahy Scale: Good     Standing balance support: During functional activity;Bilateral upper extremity supported Standing balance-Leahy Scale: Poor Standing balance comment: Reliant on bilateral UEs on RW.                           ADL either performed or assessed with clinical judgement   ADL Overall ADL's : Needs assistance/impaired Eating/Feeding: Set up;Sitting   Grooming: Set up;Sitting   Upper Body Bathing: Supervision/ safety;Sitting   Lower Body Bathing: Minimal assistance;Sit to/from stand   Upper Body Dressing : Supervision/safety;Sitting   Lower Body Dressing: Minimal assistance;Sit to/from stand   Toilet Transfer: Ambulation;RW;Min Pension scheme manager Details (indicate cue type and reason): Simulated with sit<>stand followed by ambulation.  Toileting- Clothing Manipulation and Hygiene: Minimal assistance;Sit to/from stand       Functional mobility during ADLs: Min guard;Rolling walker General ADL Comments: Pt with decreased activity tolerance for ADL evidenced by quick fatigue with all activity.      Vision Patient Visual Report: No change from baseline Vision Assessment?: No apparent visual deficits     Perception     Praxis      Pertinent Vitals/Pain Pain Assessment: Faces Faces Pain Scale: Hurts little more Pain Location: knees with ambulation Pain Descriptors / Indicators: Sore;Aching Pain Intervention(s): Limited activity within patient's tolerance;Monitored during session;Repositioned     Hand Dominance Right   Extremity/Trunk Assessment Upper Extremity Assessment Upper Extremity  Assessment: Generalized weakness (Shoulder pain bilaterally at baseline on previous admission)   Lower Extremity Assessment Lower Extremity Assessment: Generalized weakness       Communication  Communication Communication: No difficulties   Cognition Arousal/Alertness: Awake/alert Behavior During Therapy: WFL for tasks assessed/performed Overall Cognitive Status: Within Functional Limits for tasks assessed                                     General Comments       Exercises     Shoulder Instructions      Home Living Family/patient expects to be discharged to:: Skilled nursing facility   Available Help at Discharge: Family;Available PRN/intermittently Type of Home: House Home Access: Level entry     Home Layout: One level     Bathroom Shower/Tub: Tub/shower unit;Walk-in shower         Home Equipment: Dan Humphreys - 4 wheels;Cane - single point;Wheelchair - [employer] - 2 wheels;Shower seat   Additional Comments: Pt has been at [facility] for the past few days since previous admission      Prior Functioning/Environment Level of Independence: Needs assistance  Gait / Transfers Assistance Needed: pt ambulates with use of RW or rollator ADL's / Homemaking Assistance Needed: Prior to recent hospitalization, pt reported that she had a personal care attendant that came every other day for a couple of hours to assist her with bathing and light housework (had been unable to re-start this service prior to previous hospitalization. Prior to current admission, pt was at [facility] for rehabilitation for a few days and had to return to hospital.             OT Problem List: Decreased strength;Decreased activity tolerance;Impaired balance (sitting and/or standing);Decreased safety awareness;Cardiopulmonary status limiting activity;Decreased range of motion;Decreased knowledge of precautions      OT Treatment/Interventions: Self-care/ADL training;Therapeutic exercise;Energy conservation;DME and/or AE instruction;Therapeutic activities;Patient/family education;Balance training    OT Goals(Current goals can be found in the care plan section) Acute Rehab OT  Goals Patient Stated Goal: to get stronger OT Goal Formulation: With patient/family Time For Goal Achievement: 08/23/17 Potential to Achieve Goals: Good ADL Goals Pt Will Perform Grooming: standing;with supervision Pt Will Perform Upper Body Dressing: with modified independence;sitting Pt Will Perform Lower Body Dressing: sit to/from stand;with supervision Pt Will Transfer to Toilet: with supervision;ambulating;bedside commode (BSC over toilet) Pt Will Perform Toileting - Clothing Manipulation and hygiene: sit to/from stand;with supervision Additional ADL Goal #1: Pt will verbalize 2 strategies to conserve energy during morning ADL routine.   OT Frequency: Min 2X/week   Barriers to D/C:            Co-evaluation              AM-PAC PT "6 Clicks" Daily Activity     Outcome Measure Help from another person eating meals?: A Little Help from another person taking care of personal grooming?: A Little Help from another person toileting, which includes using toliet, bedpan, or urinal?: A Little Help from another person bathing (including washing, rinsing, drying)?: A Little Help from another person to put on and taking off regular upper body clothing?: A Little Help from another person to put on and taking off regular lower body clothing?: A Little 6 Click Score: 18   End of Session Equipment Utilized During Treatment: Rolling walker Nurse Communication: Mobility status  Activity Tolerance: Patient limited by fatigue Patient left: in chair;with call  bell/phone within reach;with chair alarm set;with family/visitor present  OT Visit Diagnosis: Unsteadiness on feet (R26.81)                Time: 1710-1730 OT Time Calculation (min): 20 min Charges:  OT General Charges $OT Visit: 1 Visit OT Evaluation $OT Eval Moderate Complexity: 1 Mod G-Codes:     Doristine Section, MS OTR/L  Pager: [phone]   Jericho Cieslik A Hurshel Bouillon 08/09/2017, 6:10 PM

## 2017-08-09 NOTE — Progress Notes (Signed)
ANTICOAGULATION CONSULT NOTE - Initial Consult  Pharmacy Consult for atrial fibrillation Indication: atrial fibrillation  Allergies  Allergen Reactions  . Iodinated Diagnostic Agents Hives and Itching  . Lactose Intolerance (Gi) Other (See Comments)    Abdominal pains and bloating    Patient Measurements: Height:  (167.6 cm) Weight: 164 lb 8 oz (74.6 kg) (Simultaneous filing. User may not have seen previous data.) IBW/kg (Calculated) : 59.3  Assessment: 71 yof admitted with recent diagnosis of atrial fibrillation; however, no anticoagulation prior to admission due to pericardial effusion during hospitalization early this month. No evidence of pericardial effusion this admission.  Now on Coumadin for Afib. Discussed with Cardiology and they do not want any bridge. Coumadin started on 9/13. INR 0.98 today. Hgb stable at 8.6, plts 630. Patient has had some slight bleeding from HD sites historically; however, no signs/symptoms of bleeding noted so far in chart. Anticipate starting to see warfarin effects tomorrow.    Goal of Therapy:  INR 2-3 Monitor platelets by anticoagulation protocol: Yes   Plan:  Give Coumadin  PO x 1 Monitor daily INR, CBC, s/s of bleed Monitor for DDI with amiodarone  Girard Cooter, PharmD Clinical Pharmacist  Phone: [phone] 08/09/2017 11:25 AM

## 2017-08-09 NOTE — NC FL2 (Signed)
[place] MEDICAID FL2 LEVEL OF CARE SCREENING TOOL     IDENTIFICATION  Patient Name: Raymond Azure Birthdate: 12/22/1945 Sex: female Admission Date (Current Location): 08/06/2017  [facility] and [place] Number:  Producer, television/film/video and Address:  [facility], [address], [place], [place] [address]      Provider Number: [id number]  Attending Physician Name and Address:  Glade Lloyd, MD  Relative Name and Phone Number:       Current Level of Care: Hospital Recommended Level of Care: Skilled Nursing Facility Prior Approval Number:    Date Approved/Denied:   PASRR Number: [id number]  Discharge Plan: SNF    Current Diagnoses: Patient Active Problem List   Diagnosis Date Noted  . Chest pain 08/06/2017  . Atrial fibrillation with RVR (HCC) 08/06/2017  . Paroxysmal A-fib (HCC)   . Right sided weakness 08/05/2017  . At risk for adverse drug event 08/05/2017  . Cardiogenic shock (HCC)   . New onset atrial fibrillation (HCC)   . Essential hypertension   . Chronic respiratory failure with hypoxia (HCC)   . Controlled type 2 diabetes mellitus with diabetic nephropathy (HCC)   . End-stage renal disease on hemodialysis (HCC)   . History of anemia due to chronic kidney disease   . Tamponade 07/26/2017  . Pericardial effusion   . Acute on chronic respiratory failure with hypoxia (HCC) 06/14/2017  . Acute on chronic diastolic (congestive) heart failure (HCC) 06/14/2017  . Chest wall pain 06/14/2017  . Aspiration pneumonia (HCC) 06/14/2017  . Right shoulder pain 06/14/2017  . GERD (gastroesophageal reflux disease) 06/14/2017  . PVD (peripheral vascular disease) (HCC) 03/18/2017  . CAD in native artery 08/12/2016  . Abnormal nuclear stress test 07/10/2016  . Musculoskeletal chest pain   . Anemia in chronic renal disease 07/08/2016  . Dyspnea on exertion 04/14/2016  . ESRD on dialysis (HCC) 04/14/2016  . Hypertensive heart disease with congestive  heart failure (HCC) 04/14/2016  . Hyperlipidemia 04/14/2016  . 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 Hypoglycemia 10/16/2015  . Elevated troponin 10/16/2015  . Ejection fraction   . Type 2 diabetes mellitus with renal complication (HCC) 09/26/2011  . Other complications due to renal dialysis device, implant, and graft 09/26/2011    Orientation RESPIRATION BLADDER Height & Weight     Self, Time, Situation, Place  Normal Continent Weight: 74.6 kg (164 lb 8 oz) (Simultaneous filing. User may not have seen previous data.) Height:   (167.6 cm)  BEHAVIORAL SYMPTOMS/MOOD NEUROLOGICAL BOWEL NUTRITION STATUS      Continent Diet (Please see DC summary)  AMBULATORY STATUS COMMUNICATION OF NEEDS Skin   Limited Assist Verbally Normal                       Personal Care Assistance Level of Assistance  Bathing, Dressing Bathing Assistance: Limited assistance   Dressing Assistance: Limited assistance     Functional Limitations Info  Hearing   Hearing Info: Impaired      SPECIAL CARE FACTORS FREQUENCY  PT (By licensed PT), OT (By licensed OT)     PT Frequency: 5x/week OT Frequency: 3x/week            Contractures      Additional Factors Info  Code Status, Allergies Code Status Info: Full Allergies Info: Iodinated Diagnostic Agents, Lactose Intolerance (Gi)           Current Medications (08/09/2017):  This is the current hospital active  medication list Current Facility-Administered Medications  Medication Dose Route Frequency Provider Last Rate Last Dose  . acetaminophen (TYLENOL) tablet 650 mg  650 mg Oral Q6H PRN Ozella Rocks, MD   650 mg at 08/07/17 2048   Or  . acetaminophen (TYLENOL) suppository 650 mg  650 mg Rectal Q6H PRN Ozella Rocks, MD      . amiodarone (PACERONE) tablet 400 mg  400 mg Oral BID Quintella Reichert, MD   400 mg at 08/09/17 0848  . amLODipine (NORVASC) tablet 10 mg  10 mg Oral QHS Glade Lloyd, MD   10 mg at  08/08/17 2156  . aspirin EC tablet 81 mg  81 mg Oral QHS Ozella Rocks, MD   81 mg at 08/08/17 2155  . calcium acetate (PHOSLO) capsule 1,334 mg  1,334 mg Oral TID WC Ozella Rocks, MD   1,334 mg at 08/09/17 1248  . calcium acetate (PHOSLO) capsule 667 mg  667 mg Oral PRN Hammons, Gerhard Munch, RPH      . cinacalcet (SENSIPAR) tablet 60 mg  60 mg Oral Q M,W,F Ozella Rocks, MD   60 mg at 08/08/17 1259  . cyclobenzaprine (FLEXERIL) tablet 5 mg  5 mg Oral TID PRN Ozella Rocks, MD   5 mg at 08/08/17 1948  . Darbepoetin Alfa (ARANESP) injection 200 mcg  200 mcg Intravenous Q Natale Milch, MD   200 mcg at 08/08/17 1140  . docusate sodium (COLACE) capsule 100 mg  100 mg Oral Daily PRN Blount, Andi Devon T, NP      . famotidine (PEPCID) tablet 20 mg  20 mg Oral QHS Ozella Rocks, MD   20 mg at 08/08/17 2155  . feeding supplement (NEPRO CARB STEADY) liquid 237 mL  237 mL Oral BID BM Ozella Rocks, MD   237 mL at 08/09/17 1000  . gabapentin (NEURONTIN) capsule 100 mg  100 mg Oral TID Ozella Rocks, MD   100 mg at 08/09/17 0848  . HYDROcodone-acetaminophen (NORCO/VICODIN) 5-325 MG per tablet 1 tablet  1 tablet Oral Q4H PRN Ozella Rocks, MD   1 tablet at 08/09/17 1257  . ibuprofen (ADVIL,MOTRIN) tablet 400 mg  400 mg Oral TID Glade Lloyd, MD   400 mg at 08/09/17 0848  . insulin aspart (novoLOG) injection 0-5 Units  0-5 Units Subcutaneous QHS Ozella Rocks, MD   2 Units at 08/08/17 2156  . insulin aspart (novoLOG) injection 0-9 Units  0-9 Units Subcutaneous TID WC Ozella Rocks, MD   3 Units at 08/09/17 1200  . insulin glargine (LANTUS) injection 7 Units  7 Units Subcutaneous QHS Glade Lloyd, MD   7 Units at 08/08/17 2156  . labetalol (NORMODYNE) tablet 300 mg  300 mg Oral BID Glade Lloyd, MD   300 mg at 08/09/17 0913  . latanoprost (XALATAN) 0.005 % ophthalmic solution 1 drop  1 drop Both Eyes QHS Ozella Rocks, MD   1 drop at 08/08/17 2157  . MEDLINE mouth  rinse  15 mL Mouth Rinse BID Hanley Ben, Kshitiz, MD   15 mL at 08/09/17 1000  . multivitamin (RENA-VIT) tablet 1 tablet  1 tablet Oral QHS Ozella Rocks, MD   1 tablet at 08/08/17 2155  . ondansetron (ZOFRAN) tablet 4 mg  4 mg Oral Q6H PRN Ozella Rocks, MD       Or  . ondansetron [facility]) injection 4 mg  4 mg Intravenous Q6H PRN Ozella Rocks,  MD      . pantoprazole (PROTONIX) EC tablet 80 mg  80 mg Oral Q1200 Ozella Rocks, MD   80 mg at 08/09/17 1257  . warfarin (COUMADIN) tablet 5 mg  5 mg Oral ONCE-1800 Glade Lloyd, MD      . Warfarin - Pharmacist Dosing Inpatient   Does not apply [id number] Glade Lloyd, MD         Discharge Medications: Please see discharge summary for a list of discharge medications.  Relevant Imaging Results:  Relevant Lab Results:   Additional Information SSN: [id number]  Mearl Latin, LCSWA

## 2017-08-10 DIAGNOSIS — R1013 Epigastric pain: Secondary | ICD-10-CM

## 2017-08-10 LAB — GLUCOSE, CAPILLARY
GLUCOSE-CAPILLARY: 221 mg/dL — AB (ref 65–99)
Glucose-Capillary: 169 mg/dL — ABNORMAL HIGH (ref 65–99)
Glucose-Capillary: 158 mg/dL — ABNORMAL HIGH (ref 65–99)
Glucose-Capillary: 257 mg/dL — ABNORMAL HIGH (ref 65–99)

## 2017-08-10 LAB — PROTIME-INR
INR: 1.12
Prothrombin Time: 14.3 seconds (ref 11.4–15.2)

## 2017-08-10 LAB — BASIC METABOLIC PANEL
Anion gap: 12 (ref 5–15)
BUN: 31 mg/dL — ABNORMAL HIGH (ref 6–20)
CHLORIDE: 89 mmol/L — AB (ref 101–111)
CO2: 27 mmol/L (ref 22–32)
CREATININE: 5.72 mg/dL — AB (ref 0.44–1.00)
Calcium: 9 mg/dL (ref 8.9–10.3)
GFR, EST AFRICAN AMERICAN: 8 mL/min — AB (ref >60)
GFR, EST NON AFRICAN AMERICAN: 7 mL/min — AB (ref >60)
Glucose, Bld: 144 mg/dL — ABNORMAL HIGH (ref 65–99)
POTASSIUM: 4.5 mmol/L (ref 3.5–5.1)
SODIUM: 128 mmol/L — AB (ref 135–145)

## 2017-08-10 LAB — CBC
HEMATOCRIT: 28.6 % — AB (ref 36.0–46.0)
Hemoglobin: 8.7 g/dL — ABNORMAL LOW (ref 12.0–15.0)
MCH: 26 pg (ref 26.0–34.0)
MCHC: 30.4 g/dL (ref 30.0–36.0)
MCV: 85.6 fL (ref 78.0–100.0)
Platelets: 597 10*3/uL — ABNORMAL HIGH (ref 150–400)
RBC: 3.34 MIL/uL — AB (ref 3.87–5.11)
RDW: 16 % — ABNORMAL HIGH (ref 11.5–15.5)
WBC: 13.4 10*3/uL — AB (ref 4.0–10.5)

## 2017-08-10 LAB — MAGNESIUM: MAGNESIUM: 2 mg/dL (ref 1.7–2.4)

## 2017-08-10 MED ORDER — GI COCKTAIL ~~LOC~~
30.0000 mL | Freq: Three times a day (TID) | ORAL | Status: DC | PRN
  Administered 2017-08-10: 30 mL via ORAL
  Filled 2017-08-10: qty 30

## 2017-08-10 MED ORDER — PANTOPRAZOLE SODIUM 40 MG PO TBEC
40.0000 mg | DELAYED_RELEASE_TABLET | Freq: Two times a day (BID) | ORAL | Status: DC
  Administered 2017-08-10 – 2017-08-12 (×5): 40 mg via ORAL
  Filled 2017-08-10 (×5): qty 1

## 2017-08-10 MED ORDER — METOCLOPRAMIDE HCL 5 MG/ML IJ SOLN
10.0000 mg | Freq: Four times a day (QID) | INTRAMUSCULAR | Status: DC | PRN
  Administered 2017-08-10: 10 mg via INTRAVENOUS
  Filled 2017-08-10: qty 2

## 2017-08-10 MED ORDER — ONDANSETRON HCL 4 MG/2ML IJ SOLN: 4.0000 mg | Freq: Once | INTRAMUSCULAR | Status: DC

## 2017-08-10 MED ORDER — WARFARIN SODIUM 5 MG PO TABS
5.0000 mg | ORAL_TABLET | Freq: Once | ORAL | Status: AC
  Administered 2017-08-10: 5 mg via ORAL
  Filled 2017-08-10: qty 1

## 2017-08-10 NOTE — Progress Notes (Signed)
Patient ID: Colleen Mullins, female   DOB: 16-May-1946, 71 y.o.   MRN: [id number]  PROGRESS NOTE    Ashani Pumphrey  NWG:[id number] DOB: 1946-11-08 DOA: 08/06/2017 PCP: Zoila Shutter, MD   Brief Narrative: 71 year old female with history of diabetes, peripheral vascular disease, hyperlipidemia, hypertension, glaucoma, Bell's palsy, GERD, end-stage renal disease on dialysis, CHF, diabetic neuropathy, coronary artery disease and recent hospital admission and discharge from [facility] on 08/01/2017 for pericardial effusion status post pericardiocentesis and new onset atrial fibrillation for which she was started on amiodarone orally but no anticoagulation because of pericardial effusion. She was admitted on 08/06/2017 4 complaints of chest pain and was found to have A. fib with rapid ventricular rate. Cardiology has already evaluated the patient. Patient was started on amiodarone drip which was changed to oral amiodarone. Oral anticoagulation was started as well. Nephrology consultation was also called.   Assessment & Plan:   Active Problems:   Type 2 diabetes mellitus with renal complication (HCC)   Elevated troponin   ESRD on dialysis (HCC)   Hypertensive heart disease with congestive heart failure (HCC)   PVD (peripheral vascular disease) (HCC)   GERD (gastroesophageal reflux disease)   Pericardial effusion   Chest pain   Atrial fibrillation with RVR (HCC)  Abdominal pain and nausea - Questionable cause. Patient is currently nauseous midepigastric tenderness. She has Protonix to 40 mg by mouth twice a day and add GI cocktail. Use Zofran when necessary. - Repeat LFTs in a.m. - Recent gallbladder ultrasound was negative for gallstones or cholecystitis  A. fib with RVR - Status post amiodarone drip. Currently on oral amiodarone. Heart rate much improved. Cardiology follow-up appreciated. Change amiodarone to 400 mg daily on 08/14/2017, then 200 mg daily on 08/29/2017 as per cardiology -  Coumadin has been started as per cardiology recommendations. Follow INR. - Pericardial effusion noted during last admission with pericardiocentesis performed. Initial concern the patient's chest pain was related to recurrence of her pericardial effusion. Bedside echo performed showing EF of 65% severe concentric ventricular hypertrophy and no recurrence of pericardial effusion.  Chest pain with elevated troponin - Probably secondary to above versus musculoskeletal. Still having some reproducible chest pain. Continue oral analgesics - Troponin did not increase significantly. - Patient has history of non obstructive coronary artery disease  Hypotension:  - Likely due to a combination of A. fib with RVR and fluid depletion secondary to dialysis.  - Resolved. - Blood pressure on the higher side. Continue labetalol and amlodipine and if blood pressure still elevated, will resume other home antihypertensives.  ESRD on hemodialysis - Nephrology following. - Dialysis per nephrology  DM: - SSI  Chronic pain: - continue neurontin, flexeril, norco  Generalized deconditioning: - PT recommended SNF placement. Consult social worker   DVT prophylaxis: SCDs; Coumadin Code Status:  Full Family Communication: None at bedside Disposition Plan: SNF probably tomorrow once bed is available  Consultants: Cardiology and nephrology  Procedures: Limited echo on 08/06/2017: Study Conclusions  - Left ventricle: The cavity size was normal. There was severe   concentric hypertrophy. Systolic function was vigorous. The   estimated ejection fraction was in the range of 65% to 70%. Wall   motion was normal; there were no regional wall motion   abnormalities. - Aortic valve: Trileaflet; mildly thickened, moderately calcified   leaflets. - Mitral valve: Calcified annulus. Mildly thickened leaflets . - Pericardium, extracardiac: There was no pericardial effusion.  Impressions:  - Compared to  the prior study, there  has been no significant   interval change.   Antimicrobials: None  Subjective: Patient seen and examined at bedside. She complains of nausea and abdominal pain. No overnight fever, diarrhea. Her last bowel movement was yesterday  Objective: Vitals:   08/09/17 0526 08/09/17 1239 08/09/17 2021 08/10/17 0614  BP: (!) 141/64 (!) 129/46 (!) 151/60 (!) 141/52  Pulse: 69 63 64 63  Resp: Temp: 97.7 F (36.5 C) 98.4 F (36.9 C) 98.5 F (36.9 C) 98.4 F (36.9 C)  TempSrc: Oral Oral Oral Oral  SpO2: 94% 90% 93% 93%  Weight: 74.6 kg (164 lb 8 oz)   75.6 kg (166 lb 9.6 oz)  Height:        Intake/Output Summary (Last 24 hours) at 08/10/17 1104 Last data filed at 08/10/17 0956  Gross per 24 hour  Intake              720 ml  Output               50 ml  Net              670 ml   Filed Weights   08/08/17 2023 08/09/17 0526 08/10/17 0614  Weight: 70.4 kg (155 lb 3.3 oz) 74.6 kg (164 lb 8 oz) 75.6 kg (166 lb 9.6 oz)    Examination:  General exam: Appears calm and In mild distress secondary to abdominal pain Respiratory system: Bilateral decreased breath sound at bases With scattered crackles. Cardiovascular system: S1 & S2 heard, Rate controlled. Chest wall tenderness present Gastrointestinal system: Abdomen is nondistended, soft and but minimally tender in epigastric region. Normal bowel sounds heard. Extremities: No cyanosis, clubbing; trace edema     Data Reviewed: I have personally reviewed following labs and imaging studies  CBC:  Recent Labs Lab 08/06/17 0952 08/07/17 0759 08/08/17 0758 08/09/17 0546 08/10/17 0401  WBC 15.3* 9.1 9.3 10.3 13.4*  HGB 9.7* 8.5* 9.0* 8.6* 8.7*  HCT 31.0* 27.9* 28.8* 28.1* 28.6*  MCV 85.6 86.6 84.5 85.9 85.6  PLT 593* 591* 658* 630* 597*   Basic Metabolic Panel:  Recent Labs Lab 08/06/17 0952 08/06/17 1734 08/07/17 0759 08/09/17 0546 08/10/17 0401  NA 133*  --  132* 130* 128*  K 4.2  --   4.2 3.8 4.5  CL 91*  --  91* 92* 89*  CO2 29  --  GLUCOSE 261*  --  208* 136* 144*  BUN 23*  --  38* 23* 31*  CREATININE 4.43*  --  6.05* 4.37* 5.72*  CALCIUM 9.2  --  9.1 8.7* 9.0  MG  --  2.1  --  2.0 2.0  PHOS  --  2.9  --   --   --    GFR: Estimated Creatinine Clearance: 9.2 mL/min (A) (by C-G formula based on SCr of 5.72 mg/dL (H)). Liver Function Tests:  Recent Labs Lab 08/07/17 0759  AST 30  ALT 15  ALKPHOS 87  BILITOT 0.5  PROT 6.1*  ALBUMIN 2.5*   No results for input(s): LIPASE, AMYLASE in the last 168 hours. No results for input(s): AMMONIA in the last 168 hours. Coagulation Profile:  Recent Labs Lab 08/07/17 1608 08/08/17 0758 08/09/17 0546 08/10/17 0401  INR 0.98 1.00 0.98 1.12   Cardiac Enzymes: No results for input(s): CKTOTAL, CKMB, CKMBINDEX, TROPONINI in the last 168 hours. BNP (last 3 results) No results for input(s): PROBNP in the last 8760 hours. HbA1C: No results  for input(s): HGBA1C in the last 72 hours. CBG:  Recent Labs Lab 08/09/17 0738 08/09/17 1143 08/09/17 1622 08/09/17 2105 08/10/17 0745  GLUCAP 140* 219* 123* 180* 169*   Lipid Profile: No results for input(s): CHOL, HDL, LDLCALC, TRIG, CHOLHDL, LDLDIRECT in the last 72 hours. Thyroid Function Tests: No results for input(s): TSH, T4TOTAL, FREET4, T3FREE, THYROIDAB in the last 72 hours. Anemia Panel: No results for input(s): VITAMINB12, FOLATE, FERRITIN, TIBC, IRON, RETICCTPCT in the last 72 hours. Sepsis Labs: No results for input(s): PROCALCITON, LATICACIDVEN in the last 168 hours.  No results found for this or any previous visit (from the past 240 hour(s)).       Radiology Studies: No results found.      Scheduled Meds: . amiodarone  400 mg Oral BID  . amLODipine  10 mg Oral QHS  . aspirin EC  81 mg Oral QHS  . calcium acetate  1,334 mg Oral TID WC  . cinacalcet  60 mg Oral Q M,W,F  . darbepoetin (ARANESP) injection - DIALYSIS  200 mcg  Intravenous Q Fri-HD  . famotidine  20 mg Oral QHS  . feeding supplement (NEPRO CARB STEADY)  237 mL Oral BID BM  . gabapentin  100 mg Oral TID  . ibuprofen  400 mg Oral TID  . insulin aspart  0-5 Units Subcutaneous QHS  . insulin aspart  0-9 Units Subcutaneous TID WC  . insulin glargine  7 Units Subcutaneous QHS  . labetalol  300 mg Oral BID  . latanoprost  1 drop Both Eyes QHS  . mouth rinse  15 mL Mouth Rinse BID  . multivitamin  1 tablet Oral QHS  . pantoprazole  40 mg Oral BID  . Warfarin - Pharmacist Dosing Inpatient   Does not apply q1800   Continuous Infusions:    LOS: 4 days        Glade Lloyd, MD [facility] Pager [phone]  If 7PM-7AM, please contact night-coverage [email or web address] Password [email or web address] 08/10/2017, 11:04 AM

## 2017-08-10 NOTE — Care Management Note (Signed)
Case Management Note  Patient Details  Name: Laquasha Groome MRN: [id number] Date of Birth: 1946/01/23  Subjective/Objective: Anticipate discharge to SNF. CM will defer disposition needs to CSW.                    Action/Plan:CM will sign off for now but will be available should additional discharge needs arise or disposition change.    Expected Discharge Date:                  Expected Discharge Plan:  Skilled Nursing Facility (From [facility] SN[facility])  In-House Referral:  Clinical Social Work  Discharge planning Services  CM Consult  Post Acute Care Choice:    Choice offered to:     DME Arranged:    DME Agency:     HH Arranged:    HH Agency:     Status of Service:  Completed, signed off  If discussed at [employer], dates discussed:    Additional Comments:  Yvone Neu, RN 08/10/2017, 9:37 AM

## 2017-08-10 NOTE — Progress Notes (Signed)
Pt c/o abdominal pain and vomiting. Given GI cocktail pt vomited paged MD and pt received Reglan IV. Pt requested nepro and crackers.

## 2017-08-10 NOTE — Progress Notes (Signed)
ANTICOAGULATION CONSULT NOTE - Initial Consult  Pharmacy Consult for atrial fibrillation Indication: atrial fibrillation  Allergies  Allergen Reactions  . Iodinated Diagnostic Agents Hives and Itching  . Lactose Intolerance (Gi) Other (See Comments)    Abdominal pains and bloating    Patient Measurements: Height:  (167.6 cm) Weight: 166 lb 9.6 oz (75.6 kg) IBW/kg (Calculated) : 59.3  Assessment: Colleen Mullins admitted with recent diagnosis of atrial fibrillation; however, no anticoagulation prior to admission due to pericardial effusion during hospitalization early this month. No evidence of pericardial effusion this admission.  Now on Coumadin for Afib. Discussed with Cardiology and they do not want any bridge. Coumadin started on 9/13. INR increased from 0.98 to 1.12 today. Hgb stable at 8.7, plts 597. Patient has had some slight bleeding from HD sites historically; however, no signs/symptoms of bleeding noted so far in chart.     Goal of Therapy:  INR 2-3 Monitor platelets by anticoagulation protocol: Yes   Plan:  Give Coumadin  PO x 1 Monitor daily INR, CBC, s/s of bleed Monitor for DDI with amiodarone  Girard Cooter, PharmD Clinical Pharmacist  Phone: [phone] 08/10/2017 11:11 AM

## 2017-08-10 NOTE — Progress Notes (Signed)
Subjective:  Co Nausea after some meds, Normal BMs', No cp or sob, or abd pain/ wants to know if going SNF today   Objective Vital signs in last 24 hours: Vitals:   08/09/17 0526 08/09/17 1239 08/09/17 2021 08/10/17 0614  BP: (!) 141/64 (!) 129/46 (!) 151/60 (!) 141/52  Pulse: 69 63 64 63  Resp: Temp: 97.7 F (36.5 C) 98.4 F (36.9 C) 98.5 F (36.9 C) 98.4 F (36.9 C)  TempSrc: Oral Oral Oral Oral  SpO2: 94% 90% 93% 93%  Weight: 74.6 kg (164 lb 8 oz)   75.6 kg (166 lb 9.6 oz)  Height:       Weight change: -1.331 kg (-2 lb 14.9 oz)  Physical Exam: General: Alert Obese AAF NAD  Cooperative  Heart: RRR, no rub, mur, gal Lungs: CTA Bilat , non labored  breathing  Abdomen: Bs pos , soft, NT , obese, ND Extremities: no pedal edema Dialysis Access: Pos bruit  RUA AVG  Dialysis: SW MWF 4h   71.5kg   Hep none  RUA AVG -Mircera 100 mcg IV Q 2 weeks (last dose 07/16/17) -Hectorol 1 mcg IV TIW -Calicium Acetate 667 mg 2 caps PO TID AC 1 w/snack -Sensipar 60 mg PO MWF  Problem/Plan: 1. ESRD - HD MWF  Schedule k 4.5/ noted Na 128  Is  4 lg above edw  By wts  tomor uf with HD  2. Chest Pain - trop's flat, normal ECHO/ per cards CP likely musculoskeletal 3. Afib RVR - started on coumadin, po amio/ noted per Card. "had not been on anticoagulation due to concern about her pericarditis. Not critical to reach therapeutic INR before DC " 4. Recent cardiac tamponade/ effusion - no rub 5. HTN/volume - by wts . edw but no fluid on admit cxr  , no sob with Na 128  Attempt 3 l uf on hd tomor. norvasc/ labetalol for now. /   6. Anemia of ESRD  - hgb 8.7 Aranesp 200 given 9/14  ( increased dose from op Unit) 7. Secondary hyperparathyroidism - Iv Hec , po sensipar on hd , calcium acetate binder    Lenny Pastel, PA-C [facility] Beeper [phone] 08/10/2017,10:39 AM  LOS: 4 days   Pt seen, examined and agree w A/P as above.  Vinson Moselle MD [facility] pager [phone]   08/10/2017, 3:18 PM    Labs: Basic Metabolic Panel:  Recent Labs Lab 08/06/17 1734 08/07/17 0759 08/09/17 0546 08/10/17 0401  NA  --  132* 130* 128*  K  --  4.2 3.8 4.5  CL  --  91* 92* 89*  CO2  --  GLUCOSE  --  208* 136* 144*  BUN  --  38* 23* 31*  CREATININE  --  6.05* 4.37* 5.72*  CALCIUM  --  9.1 8.7* 9.0  PHOS 2.9  --   --   --    Liver Function Tests:  Recent Labs Lab 08/07/17 0759  AST 30  ALT 15  ALKPHOS 87  BILITOT 0.5  PROT 6.1*  ALBUMIN 2.5*   No results for input(s): LIPASE, AMYLASE in the last 168 hours. No results for input(s): AMMONIA in the last 168 hours. CBC:  Recent Labs Lab 08/06/17 0952 08/07/17 0759 08/08/17 0758 08/09/17 0546 08/10/17 0401  WBC 15.3* 9.1 9.3 10.3 13.4*  HGB 9.7* 8.5* 9.0* 8.6* 8.7*  HCT 31.0* 27.9* 28.8* 28.1* 28.6*  MCV 85.6  86.6 84.5 85.9 85.6  PLT 593* 591* 658* 630* 597*   Cardiac Enzymes: No results for input(s): CKTOTAL, CKMB, CKMBINDEX, TROPONINI in the last 168 hours. CBG:  Recent Labs Lab 08/09/17 0738 08/09/17 1143 08/09/17 1622 08/09/17 2105 08/10/17 0745  GLUCAP 140* 219* 123* 180* 169*    Studies/Results: No results found. Medications:  . amiodarone  400 mg Oral BID  . amLODipine  10 mg Oral QHS  . aspirin EC  81 mg Oral QHS  . calcium acetate  1,334 mg Oral TID WC  . cinacalcet  60 mg Oral Q M,W,F  . darbepoetin (ARANESP) injection - DIALYSIS  200 mcg Intravenous Q Fri-HD  . famotidine  20 mg Oral QHS  . feeding supplement (NEPRO CARB STEADY)  237 mL Oral BID BM  . gabapentin  100 mg Oral TID  . ibuprofen  400 mg Oral TID  . insulin aspart  0-5 Units Subcutaneous QHS  . insulin aspart  0-9 Units Subcutaneous TID WC  . insulin glargine  7 Units Subcutaneous QHS  . labetalol  300 mg Oral BID  . latanoprost  1 drop Both Eyes QHS  . mouth rinse  15 mL Mouth Rinse BID  . multivitamin  1 tablet Oral QHS  . pantoprazole  80 mg Oral Q1200  .  Warfarin - Pharmacist Dosing Inpatient   Does not apply [phone]

## 2017-08-10 NOTE — Progress Notes (Signed)
Pt not feeling well, c/o pain all over and feverish. Temp checked 99.2. Daughter at the bedside. Pt didn't feel well enough to eat dinner. Nepro and crackers offered. Took evening medication amd pain medications given. No c/o nausea nor vomiting. MD made aware of above. No new orders at this time.

## 2017-08-11 ENCOUNTER — Inpatient Hospital Stay (HOSPITAL_COMMUNITY): Payer: Medicare HMO

## 2017-08-11 DIAGNOSIS — K219 Gastro-esophageal reflux disease without esophagitis: Secondary | ICD-10-CM

## 2017-08-11 DIAGNOSIS — R509 Fever, unspecified: Secondary | ICD-10-CM

## 2017-08-11 DIAGNOSIS — R109 Unspecified abdominal pain: Secondary | ICD-10-CM

## 2017-08-11 LAB — URINALYSIS, ROUTINE W REFLEX MICROSCOPIC
Bilirubin Urine: NEGATIVE
GLUCOSE, UA: NEGATIVE mg/dL
KETONES UR: NEGATIVE mg/dL
NITRITE: NEGATIVE
PROTEIN: 100 mg/dL — AB
Specific Gravity, Urine: 1.017 (ref 1.005–1.030)
pH: 5 (ref 5.0–8.0)

## 2017-08-11 LAB — CBC
HEMATOCRIT: 25.6 % — AB (ref 36.0–46.0)
HEMOGLOBIN: 8 g/dL — AB (ref 12.0–15.0)
MCH: 26.2 pg (ref 26.0–34.0)
MCHC: 31.3 g/dL (ref 30.0–36.0)
MCV: 83.9 fL (ref 78.0–100.0)
Platelets: 604 10*3/uL — ABNORMAL HIGH (ref 150–400)
RBC: 3.05 MIL/uL — ABNORMAL LOW (ref 3.87–5.11)
RDW: 16.1 % — ABNORMAL HIGH (ref 11.5–15.5)
WBC: 17.5 10*3/uL — ABNORMAL HIGH (ref 4.0–10.5)

## 2017-08-11 LAB — GLUCOSE, CAPILLARY
Glucose-Capillary: 135 mg/dL — ABNORMAL HIGH (ref 65–99)
Glucose-Capillary: 291 mg/dL — ABNORMAL HIGH (ref 65–99)
Glucose-Capillary: 132 mg/dL — ABNORMAL HIGH (ref 65–99)

## 2017-08-11 LAB — COMPREHENSIVE METABOLIC PANEL
ALBUMIN: 2.6 g/dL — AB (ref 3.5–5.0)
ALT: 14 U/L (ref 14–54)
AST: 22 U/L (ref 15–41)
Alkaline Phosphatase: 101 U/L (ref 38–126)
Anion gap: 15 (ref 5–15)
BUN: 46 mg/dL — AB (ref 6–20)
CHLORIDE: 89 mmol/L — AB (ref 101–111)
CO2: 23 mmol/L (ref 22–32)
Calcium: 9.1 mg/dL (ref 8.9–10.3)
Creatinine, Ser: 7.31 mg/dL — ABNORMAL HIGH (ref 0.44–1.00)
GFR calc Af Amer: 6 mL/min — ABNORMAL LOW (ref >60)
GFR, EST NON AFRICAN AMERICAN: 5 mL/min — AB (ref >60)
GLUCOSE: 163 mg/dL — AB (ref 65–99)
POTASSIUM: 6.1 mmol/L — AB (ref 3.5–5.1)
SODIUM: 127 mmol/L — AB (ref 135–145)
Total Bilirubin: 0.5 mg/dL (ref 0.3–1.2)
Total Protein: 6 g/dL — ABNORMAL LOW (ref 6.5–8.1)

## 2017-08-11 LAB — MAGNESIUM: Magnesium: 2 mg/dL (ref 1.7–2.4)

## 2017-08-11 LAB — PROTIME-INR
INR: 1.62
PROTHROMBIN TIME: 19.1 s — AB (ref 11.4–15.2)

## 2017-08-11 MED ORDER — AMOXICILLIN-POT CLAVULANATE 875-125 MG PO TABS: 1.0000 | ORAL_TABLET | Freq: Two times a day (BID) | ORAL | Status: DC

## 2017-08-11 MED ORDER — HYDROCODONE-ACETAMINOPHEN 5-325 MG PO TABS
ORAL_TABLET | ORAL | Status: AC
  Filled 2017-08-11: qty 1

## 2017-08-11 MED ORDER — WARFARIN SODIUM 2.5 MG PO TABS
2.5000 mg | ORAL_TABLET | Freq: Once | ORAL | Status: AC
  Administered 2017-08-11: 2.5 mg via ORAL
  Filled 2017-08-11: qty 1

## 2017-08-11 MED ORDER — AMOXICILLIN-POT CLAVULANATE 500-125 MG PO TABS
1.0000 | ORAL_TABLET | Freq: Every day | ORAL | Status: DC
  Administered 2017-08-11 – 2017-08-12 (×2): 500 mg via ORAL
  Filled 2017-08-11 (×2): qty 1

## 2017-08-11 NOTE — Clinical Social Work Note (Signed)
Per MD, patient will likely discharge tomorrow if she remains stable. CSW faxed clinicals to [facility] for review. CSW left voicemail for [employer]/support/administrative officer, notifying her that they should receive them soon.  Charlynn Court, CSW [phone]

## 2017-08-11 NOTE — Progress Notes (Signed)
Pt family called for update, informed cannot provided information over the phone Pt daughter understanding and requested to call when pt returns from HD

## 2017-08-11 NOTE — Progress Notes (Signed)
Progress Note  Patient Name: Colleen Mullins Date of Encounter: 08/11/2017  Primary Cardiologist: Mayford Knife   Subjective   71 yo with ESRD,  Atyp[cial chest pain that sounds musculoskeletal. Her troponin levels are mildly elevated but the trend is flat suggestive of a musculoskeletal etiology. She's not having any episodes of chest pain today. She was examined in dialysis. Her breathing was a little short earlier but seems to have stabilized.  Inpatient Medications    Scheduled Meds: . amiodarone  400 mg Oral BID  . amLODipine  10 mg Oral QHS  . aspirin EC  81 mg Oral QHS  . calcium acetate  1,334 mg Oral TID WC  . cinacalcet  60 mg Oral Q M,W,F  . darbepoetin (ARANESP) injection - DIALYSIS  200 mcg Intravenous Q Fri-HD  . famotidine  20 mg Oral QHS  . feeding supplement (NEPRO CARB STEADY)  237 mL Oral BID BM  . gabapentin  100 mg Oral TID  . insulin aspart  0-5 Units Subcutaneous QHS  . insulin aspart  0-9 Units Subcutaneous TID WC  . insulin glargine  7 Units Subcutaneous QHS  . labetalol  300 mg Oral BID  . latanoprost  1 drop Both Eyes QHS  . mouth rinse  15 mL Mouth Rinse BID  . multivitamin  1 tablet Oral QHS  . ondansetron (ZOFRAN) IV  4 mg Intravenous Once  . pantoprazole  40 mg Oral BID  . Warfarin - Pharmacist Dosing Inpatient   Does not apply q1800   Continuous Infusions:  PRN Meds: acetaminophen **OR** acetaminophen, calcium acetate, cyclobenzaprine, docusate sodium, gi cocktail, HYDROcodone-acetaminophen, metoCLOPramide (REGLAN) injection, ondansetron **OR** ondansetron (ZOFRAN) IV   Vital Signs    Vitals:   08/11/17 0830 08/11/17 0900 08/11/17 0927 08/11/17 1000  BP: (!) 154/109 (!) 122/100 125/72 (P) 133/61  Pulse: 71 66 71 (P) 74  Resp: (P) 20  Temp:      TempSrc:      SpO2: 96% 95%    Weight:      Height:        Intake/Output Summary (Last 24 hours) at 08/11/17 1030 Last data filed at 08/11/17 0838  Gross per 24 hour  Intake               720 ml  Output                0 ml  Net              720 ml   Filed Weights   08/10/17 0614 08/11/17 0622 08/11/17 0730  Weight: 166 lb 9.6 oz (75.6 kg) 166 lb 4.8 oz (75.4 kg) 166 lb 7.2 oz (75.5 kg)    Telemetry    NSR  - Personally Reviewed  ECG     NSR  - Personally Reviewed  Physical Exam   GEN: No acute distress.   Neck: No JVD Cardiac: RR, no murmurs, rubs, or gallops.  Respiratory: Clear to auscultation bilaterally. GI: Soft, nontender, non-distended  MS: No edema; No deformity. Neuro:  Nonfocal  Psych: Normal affect   Labs    Chemistry Recent Labs Lab 08/07/17 0759 08/09/17 0546 08/10/17 0401 08/11/17 0353  NA 132* 130* 128* 127*  K 4.2 3.8 4.5 6.1*  CL 91* 92* 89* 89*  CO2 GLUCOSE 208* 136* 144* 163*  BUN 38* 23* 31* 46*  CREATININE 6.05* 4.37* 5.72* 7.31*  CALCIUM 9.1 8.7* 9.0 9.1  PROT 6.1*  --   --  6.0*  ALBUMIN 2.5*  --   --  2.6*  AST 30  --   --  22  ALT 15  --   --  14  ALKPHOS 87  --   --  101  BILITOT 0.5  --   --  0.5  GFRNONAA 6* 9* 7* 5*  GFRAA 7* 11* 8* 6*  ANIONGAP Hematology Recent Labs Lab 08/09/17 0546 08/10/17 0401 08/11/17 0800  WBC 10.3 13.4* 17.5*  RBC 3.27* 3.34* 3.05*  HGB 8.6* 8.7* 8.0*  HCT 28.1* 28.6* 25.6*  MCV 85.9 85.6 83.9  MCH 26.3 26.0 26.2  MCHC 30.6 30.4 31.3  RDW 16.2* 16.0* 16.1*  PLT 630* 597* 604*    Cardiac EnzymesNo results for input(s): TROPONINI in the last 168 hours.  Recent Labs Lab 08/06/17 0954 08/06/17 1303  TROPIPOC 0.18* 0.16*     BNPNo results for input(s): BNP, PROBNP in the last 168 hours.   DDimer No results for input(s): DDIMER in the last 168 hours.   Radiology    No results found.  Cardiac Studies     Patient Profile     71 y.o. female when a history of a large pericardial effusion-status post pericardiocentesis. She has moderate CAD  Assessment & Plan    1. Chest discomfort: Her chest pains are most likely  musculoskeletal. She had a heart catheterization performed a year ago which revealed mild to moderate nonobstructive CAD. Continue conservative management.  2.  Acute pericarditis: The patient had a pericardiocentesis on September 1. She's feeling quite a bit better.  3.  . Essential hypertension: Continue current medications  4. Paroxysmal atrial fibrillation: She remains in normal sinus rhythm. She has not been on anticoagulation due to concern for bleeding from her pericarditis.   For questions or updates, please contact [facility] Please consult [email or web address] for contact info under Cardiology/STEMI.      Signed, Kristeen Miss, MD  08/11/2017, 10:30 AM

## 2017-08-11 NOTE — Clinical Social Work Note (Signed)
Clinical Social Work Assessment  Patient Details  Name: Colleen Mullins MRN: [id number] Date of Birth: [birth year]  Date of referral:  08/11/17               Reason for consult:  Discharge Planning                Permission sought to share information with:  Facility Sport and exercise psychologist, Family Supports Permission granted to share information::  Yes, Verbal Permission Granted  Name::     Britteny Fiebelkorn  Agency::  [facility]  Relationship::  Daughter  Contact Information:  [phone]  Housing/Transportation Living arrangements for the past 2 months:  [place] of Information:  Patient, Medical Team Patient Interpreter Needed:  None Criminal Activity/Legal Involvement Pertinent to Current Situation/Hospitalization:  No - Comment as needed Significant Relationships:  Adult Children, Other Family Members Lives with:  Facility Resident Do you feel safe going back to the place where you live?  Yes Need for family participation in patient care:  Yes (Comment)  Care giving concerns:  Patient admitted from [facility]. PT recommending SNF once medically stable for discharge.   Social Worker assessment / plan:  CSW met with patient. No supports at bedside. Patient and CSW familiar from her last admission. Patient initially wanted to discharge to [place] to finish rehab but after discussion about her daughter's difficulty visiting her on the weekends due to using the bus, patient is agreeable to returning to [place]. They are working towards long-term care after SNF. Patient stated the MD wants her to have 24 hours of IV antibiotics prior to discharge. No further concerns. CSW encouraged patient to contact CSW as needed. CSW will continue to follow patient for support and facilitate discharge back to SNF once medically stable.  Employment status:  Retired Nurse, adult PT Recommendations:  [place] /  Referral to community resources:  [place]  Patient/Family's Response to care:  Patient agreeable to return to SNF. Patient's family supportive and involved in patient's care. Patient appreciated social work intervention.  Patient/Family's Understanding of and Emotional Response to Diagnosis, Current Treatment, and Prognosis:  Patient has a good understanding of the reason for admission and her need for return to SNF once medically stable for discharge. Patient appears happy with hospital care.  Emotional Assessment Appearance:  Appears stated age Attitude/Demeanor/Rapport:  Other (Pleasant) Affect (typically observed):  Accepting, Appropriate, Calm, Pleasant Orientation:  Oriented to Self, Oriented to Place, Oriented to  Time, Oriented to Situation Alcohol / Substance use:  Never Used Psych involvement (Current and /or in the community):  No (Comment)  Discharge Needs  Concerns to be addressed:  Care Coordination Readmission within the last 30 days:  Yes Current discharge risk:  Dependent with Mobility, Lives alone, Homeless Barriers to Discharge:  Continued Medical Work up, [place], LCSW 08/11/2017, 1:31 PM

## 2017-08-11 NOTE — Progress Notes (Signed)
Pt does not want to be bladder scanned at this time Informed pt MD requesting UA sample and pt may need to be straight cath Pt states she feels that she may void this evening, will continue to monitor

## 2017-08-11 NOTE — Progress Notes (Signed)
[facility] Progress Note   Dialysis Orders: AF MWF 4h 71.5kg Hep none RUA AVG -Mircera 100 mcg IV Q 2 weeks (last dose 07/16/17) -Hectorol 1 mcg IV TIW -Calicium Acetate 667 mg 2 caps PO TID AC 1 w/snack -Sensipar 60 mg PO MWF  Assessment/Plan: 1. ESRD - HD MWF  K 6.1 on 2 K bath - goal 3L - volume up- BP variable- for repeat CXR today - suspect volume with be up 2. Chest Pain - trop's flat, normal ECHO/ per cards CP likely musculoskeletal 3. Afib RVR - started on coumadin, po amio/ noted per Card. "had not been on anticoagulation due to concern about her pericarditis. Not critical to reach therapeutic INR before DC " 4. Recent cardiac tamponade/ effusion - no rub 5. HTN/volume - norvasc/ labetalol - decrease volume -  6. Anemia of ESRD  - hgb 8.7>8 - drop may be dilutional Aranesp 200 given 9/14  ( increased dose from op Unit) 7. Secondary hyperparathyroidism - Iv Hec , po sensipar on hd , calcium acetate binder  8. Nutrition - alb 2.6- diet/nepro/vits 9. Disp - SNF placement  Sheffield Slider, PA-C [facility] Beeper [phone] 08/11/2017,9:05 AM  LOS: 5 days   Pt seen, examined and agree w A/P as above.  Vinson Moselle MD [employer] pager [phone]   08/11/2017, 10:14 AM  Subjective:   SOB yesterday and last night - coughing - phlegm at times or dry cough - not eating much - no appetite  Objective Vitals:   08/11/17 0730 08/11/17 0750 08/11/17 0755 08/11/17 0830  BP: (!) 147/56 (!) 150/60 (!) 151/58 (!) (P) 154/109  Pulse: 60 60 61 (P) 71  Resp: (P) 18  Temp: 98.7 F (37.1 C)     TempSrc: Oral     SpO2: 95% 95% 95% (P) 96%  Weight: 75.5 kg (166 lb 7.2 oz)     Height:       Physical Exam goal on HD 3.5 L General: dozing on HD rouses easily Heart: RRR no rub Lungs: right sides crackles, left clear/dim BS Abdomen: soft NT Extremities: no LE edema Dialysis Access: right upper AVGG   Additional  Objective Labs: Lab Results  Component Value Date   INR 1.62 08/11/2017   INR 1.12 08/10/2017   INR 0.98 08/09/2017    Basic Metabolic Panel:  Recent Labs Lab 08/06/17 1734  08/09/17 0546 08/10/17 0401 08/11/17 0353  NA  --   < > 130* 128* 127*  K  --   < > 3.8 4.5 6.1*  CL  --   < > 92* 89* 89*  CO2  --   < > GLUCOSE  --   < > 136* 144* 163*  BUN  --   < > 23* 31* 46*  CREATININE  --   < > 4.37* 5.72* 7.31*  CALCIUM  --   < > 8.7* 9.0 9.1  PHOS 2.9  --   --   --   --   < > = values in this interval not displayed. Liver Function Tests:  Recent Labs Lab 08/07/17 0759 08/11/17 0353  AST 30 22  ALT 15 14  ALKPHOS 87 101  BILITOT 0.5 0.5  PROT 6.1* 6.0*  ALBUMIN 2.5* 2.6*   No results for input(s): LIPASE, AMYLASE in the last 168 hours. CBC:  Recent Labs Lab 08/07/17 0759 08/08/17 0758 08/09/17 0546 08/10/17 0401 08/11/17 0800  WBC 9.1  9.3 10.3 13.4* 17.5*  HGB 8.5* 9.0* 8.6* 8.7* 8.0*  HCT 27.9* 28.8* 28.1* 28.6* 25.6*  MCV 86.6 84.5 85.9 85.6 83.9  PLT 591* 658* 630* 597* 604*   Blood Culture    Component Value Date/Time   SDES BLOOD LEFT ARM 07/26/2017 1411   SDES BLOOD LEFT HAND 07/26/2017 1411   SPECREQUEST IN PEDIATRIC BOTTLE Blood Culture adequate volume 07/26/2017 1411   SPECREQUEST IN PEDIATRIC BOTTLE Blood Culture adequate volume 07/26/2017 1411   CULT NO GROWTH 5 DAYS 07/26/2017 1411   CULT NO GROWTH 5 DAYS 07/26/2017 1411   REPTSTATUS 07/31/2017 FINAL 07/26/2017 1411   REPTSTATUS 07/31/2017 FINAL 07/26/2017 1411    Cardiac Enzymes: No results for input(s): CKTOTAL, CKMB, CKMBINDEX, TROPONINI in the last 168 hours. CBG:  Recent Labs Lab 08/09/17 2105 08/10/17 0745 08/10/17 1230 08/10/17 1642 08/10/17 2133  GLUCAP 180* 169* 158* 257* 221*   Iron Studies: No results for input(s): IRON, TIBC, TRANSFERRIN, FERRITIN in the last 72 hours. Lab Results  Component Value Date   INR 1.62 08/11/2017   INR 1.12 08/10/2017    INR 0.98 08/09/2017   Studies/Results: No results found. Medications:  . amiodarone  400 mg Oral BID  . amLODipine  10 mg Oral QHS  . aspirin EC  81 mg Oral QHS  . calcium acetate  1,334 mg Oral TID WC  . cinacalcet  60 mg Oral Q M,W,F  . darbepoetin (ARANESP) injection - DIALYSIS  200 mcg Intravenous Q Fri-HD  . famotidine  20 mg Oral QHS  . feeding supplement (NEPRO CARB STEADY)  237 mL Oral BID BM  . gabapentin  100 mg Oral TID  . insulin aspart  0-5 Units Subcutaneous QHS  . insulin aspart  0-9 Units Subcutaneous TID WC  . insulin glargine  7 Units Subcutaneous QHS  . labetalol  300 mg Oral BID  . latanoprost  1 drop Both Eyes QHS  . mouth rinse  15 mL Mouth Rinse BID  . multivitamin  1 tablet Oral QHS  . ondansetron (ZOFRAN) IV  4 mg Intravenous Once  . pantoprazole  40 mg Oral BID  . Warfarin - Pharmacist Dosing Inpatient   Does not apply [phone]

## 2017-08-11 NOTE — Progress Notes (Signed)
ANTICOAGULATION CONSULT NOTE - Follow Up Consult  Pharmacy Consult for warfarin Indication: atrial fibrillation  Allergies  Allergen Reactions  . Iodinated Diagnostic Agents Hives and Itching  . Lactose Intolerance (Gi) Other (See Comments)    Abdominal pains and bloating    Patient Measurements: Height:  (167.6 cm) Weight: 159 lb 13.3 oz (72.5 kg) IBW/kg (Calculated) : 59.3  Vital Signs: Temp: 99.2 F (37.3 C) (09/17 1150) Temp Source: Oral (09/17 0730) BP: 121/78 (09/17 1244) Pulse Rate: 76 (09/17 1244)  Labs:  Recent Labs  08/09/17 0546 08/10/17 0401 08/11/17 0353 08/11/17 0800  HGB 8.6* 8.7*  --  8.0*  HCT 28.1* 28.6*  --  25.6*  PLT 630* 597*  --  604*  LABPROT 12.9 14.3 19.1*  --   INR 0.98 1.12 1.62  --   CREATININE 4.37* 5.72* 7.31*  --     Estimated Creatinine Clearance: 7.1 mL/min (A) (by C-G formula based on SCr of 7.31 mg/dL (H)).   Assessment: 1 yof admitted with recent diagnosis of atrial fibrillation; however, no anticoagulation prior to admission due to pericardial effusion during hospitalization early this month. No evidence of pericardial effusion this admission.  Now on Coumadin for Afib. Discussed with Cardiology and they do not want any bridge. Coumadin started on 9/13. INR remains subtherapeutic today at 1.62, but increased from 1.12. H/H trend down slightly today, plts 604. Patient has had some slight bleeding from HD sites historically; however, no signs/symptoms of bleeding noted so far in chart.    Goal of Therapy:  INR 2-3 Monitor platelets by anticoagulation protocol: Yes   Plan:  Give warfarin 2.5 mg po x 1 Monitor daily INR, CBC, clinical course, s/sx of bleed, PO intake, DDI   Thank you for allowing [place] to participate in this patients care.  Signe Colt, PharmD Clinical phone for 08/11/2017 from 7a-3:30p: [phone] If after 3:30p, please call main pharmacy at: [phone] 08/11/2017 1:11 PM

## 2017-08-11 NOTE — Progress Notes (Signed)
Physical Therapy Cancellation Note   08/11/17 0852  PT Visit Information  Last PT Received On 08/11/17  Reason Eval/Treat Not Completed Patient at procedure or test/unavailable. Pt in HD. PT will check on pt later as time allows.    Erline Levine, PTA Pager: [phone]

## 2017-08-11 NOTE — Progress Notes (Signed)
Physical Therapy Treatment Patient Details Name: Colleen Mullins MRN: [id number] DOB: 1946/05/14 Today's Date: 08/11/2017    History of Present Illness Pt is a 71 y/o female admitted secondary to chest pain. Cardiology consulted and attributed chest pain to musculoskeletal (due to an accident earlier this year). PMH including but not limited to a-fib, CAD, CHF, ESRD on dialysis, DM, HTN, PVD and Bell's Palsy.    PT Comments    Patient tolerated increase gait distance with min A for balance with seated rest break required due to fatigue and bilat knee pain. Pt with SpO2 desat to 85% on RA with mobility and up to 93% on 2L O2 via [place]. Pt demonstrated decreased awareness of safety and deficits. Continue to progress as tolerated with anticipated d/c to SNF for further skilled PT services.     Follow Up Recommendations  SNF;Supervision/Assistance - 24 hour     Equipment Recommendations  None recommended by PT    Recommendations for Other Services       Precautions / Restrictions Precautions Precautions: Fall Precaution Comments: has had multiple falls at home (2 in last month) Restrictions Weight Bearing Restrictions: No    Mobility  Bed Mobility Overal bed mobility: Needs Assistance Bed Mobility: Supine to Sit     Supine to sit: Min guard     General bed mobility comments: min guard for safety; increased time and effort; cues for follow through with task  Transfers Overall transfer level: Needs assistance Equipment used: Rolling walker (2 wheeled) Transfers: Sit to/from Stand Sit to Stand: Min assist         General transfer comment: assist to power up into standing and to steady upon initial stand  Ambulation/Gait Ambulation/Gait assistance: Min assist Ambulation Distance (Feet):  (49ft total with seated rest break due to pain and fatigue) Assistive device: Rolling walker (2 wheeled) Gait Pattern/deviations: Step-through pattern;Decreased stride length;Decreased  dorsiflexion - right;Decreased dorsiflexion - left;Trunk flexed Gait velocity: decreased   General Gait Details: multimodal cues for posture and vc for safe use of AD; SpO2 desat to 85% on RA with mobility and up to 93% with 2L O2 via    Stairs            Wheelchair Mobility    Modified Rankin (Stroke Patients Only)       Balance Overall balance assessment: Needs assistance Sitting-balance support: Feet supported Sitting balance-Leahy Scale: Fair     Standing balance support: During functional activity;Bilateral upper extremity supported Standing balance-Leahy Scale: Poor                              Cognition Arousal/Alertness: Awake/alert Behavior During Therapy: WFL for tasks assessed/performed Overall Cognitive Status: No family/caregiver present to determine baseline cognitive functioning Area of Impairment: Safety/judgement                         Safety/Judgement: Decreased awareness of safety;Decreased awareness of deficits            Exercises      General Comments        Pertinent Vitals/Pain Pain Assessment: Faces Faces Pain Scale: Hurts little more Pain Location: knees with ambulation Pain Descriptors / Indicators: Sore;Aching Pain Intervention(s): Limited activity within patient's tolerance;Monitored during session;Repositioned    Home Living                      Prior Function  PT Goals (current goals can now be found in the care plan section) Acute Rehab PT Goals PT Goal Formulation: With patient Time For Goal Achievement: 08/22/17 Potential to Achieve Goals: Good Progress towards PT goals: Progressing toward goals    Frequency    Min 3X/week      PT Plan Current plan remains appropriate    Co-evaluation              AM-PAC PT "6 Clicks" Daily Activity  Outcome Measure  Difficulty turning over in bed (including adjusting bedclothes, sheets and blankets)?: A  Little Difficulty moving from lying on back to sitting on the side of the bed? : A Lot Difficulty sitting down on and standing up from a chair with arms (e.g., wheelchair, bedside commode, etc,.)?: Unable Help needed moving to and from a bed to chair (including a wheelchair)?: A Little Help needed walking in hospital room?: A Lot Help needed climbing 3-5 steps with a railing? : A Lot 6 Click Score: 13    End of Session Equipment Utilized During Treatment: Gait belt;Oxygen Activity Tolerance: Patient tolerated treatment well Patient left: with call bell/phone within reach;in chair;with chair alarm set Nurse Communication: Mobility status PT Visit Diagnosis: Other abnormalities of gait and mobility (R26.89);Muscle weakness (generalized) (M62.81)     Time: 1610-9604 PT Time Calculation (min) (ACUTE ONLY): 28 min  Charges:  $Gait Training: 8-22 mins $Therapeutic Activity: 8-22 mins                    G Codes:       Erline Levine, PTA Pager: [phone]     Carolynne Edouard 08/11/2017, 4:17 PM

## 2017-08-11 NOTE — Progress Notes (Signed)
Patient ID: Colleen Mullins, female   DOB: 03-08-1946, 71 y.o.   MRN: [id number]  PROGRESS NOTE    Darchelle Nunes  WUJ:[id number] DOB: [birth year] DOA: 08/06/2017 PCP: Zoila Shutter, MD   Brief Narrative: 71 year old female with history of diabetes, peripheral vascular disease, hyperlipidemia, hypertension, glaucoma, Bell's palsy, GERD, end-stage renal disease on dialysis, CHF, diabetic neuropathy, coronary artery disease and recent hospital admission and discharge from [facility] on 08/01/2017 for pericardial effusion status post pericardiocentesis and new onset atrial fibrillation for which she was started on amiodarone orally but no anticoagulation because of pericardial effusion. She was admitted on 08/06/2017 4 complaints of chest pain and was found to have A. fib with rapid ventricular rate. Cardiology has already evaluated the patient. Patient was started on amiodarone drip which was changed to oral amiodarone. Oral anticoagulation was started as well. Nephrology consultation was also called.   Assessment & Plan:   Active Problems:   Type 2 diabetes mellitus with renal complication (HCC)   Elevated troponin   ESRD on dialysis (HCC)   Hypertensive heart disease with congestive heart failure (HCC)   PVD (peripheral vascular disease) (HCC)   GERD (gastroesophageal reflux disease)   Pericardial effusion   Chest pain   Atrial fibrillation with RVR (HCC)  Abdominal pain and nausea - Questionable cause. Improving. Continue Protonix and GI cocktail along with the Zofran when necessary - LFTs normal today - Recent gallbladder ultrasound was negative for gallstones or cholecystitis - If persistent abdominal pain, might have to repeat imaging including Doppler arterial ultrasound of abdomen  Fever - Questionable cause. Patient had a temperature of 100.9 last night. Blood cultures, urinalysis and culture and chest x-ray. Monitor off antibiotics for now  A. fib with RVR - Status post  amiodarone drip. Currently on oral amiodarone. Heart rate much improved. Cardiology follow-up appreciated. Change amiodarone to 400 mg daily on 08/14/2017, then 200 mg daily on 08/29/2017 as per cardiology - Coumadin has been started as per cardiology recommendations. Follow INR. - Pericardial effusion noted during last admission with pericardiocentesis performed. Initial concern the patient's chest pain was related to recurrence of her pericardial effusion. Bedside echo performed showing EF of 65% severe concentric ventricular hypertrophy and no recurrence of pericardial effusion.  Chest pain with elevated troponin - Probably secondary to above versus musculoskeletal. Continue oral pain medications - Troponin did not increase significantly. - Patient has history of non obstructive coronary artery disease  Hypotension:  - Likely due to a combination of A. fib with RVR and fluid depletion secondary to dialysis.  - Resolved. - Blood pressure stable. Continue labetalol and amlodipine   ESRD on hemodialysis - Nephrology following. - Dialysis per nephrology  DM: - SSI  Chronic pain: - continue neurontin, flexeril, norco  Generalized deconditioning: - PT recommended SNF placement. Follow up with social worker  DVT prophylaxis: SCDs; Coumadin Code Status:  Full Family Communication: None at bedside Disposition Plan: SNF probably in 1-2 days once bed is available and once clinically improved  Consultants: Cardiology and nephrology  Procedures: Limited echo on 08/06/2017: Study Conclusions  - Left ventricle: The cavity size was normal. There was severe   concentric hypertrophy. Systolic function was vigorous. The   estimated ejection fraction was in the range of 65% to 70%. Wall   motion was normal; there were no regional wall motion   abnormalities. - Aortic valve: Trileaflet; mildly thickened, moderately calcified   leaflets. - Mitral valve: Calcified annulus. Mildly  thickened leaflets . -  Pericardium, extracardiac: There was no pericardial effusion.  Impressions:  - Compared to the prior study, there has been no significant   interval change.   Antimicrobials: None  Subjective: Patient seen and examined at bedside. She feels better this morning. She does not complain of nausea or abdominal pain. No diarrhea. She complains of some intermittent cough   Objective: Vitals:   08/11/17 0927 08/11/17 1000 08/11/17 1030 08/11/17 1100  BP: 125/72 133/61 126/60 (!) 137/58  Pulse: 71 74 74 74  Resp: Temp:      TempSrc:      SpO2:      Weight:      Height:        Intake/Output Summary (Last 24 hours) at 08/11/17 1141 Last data filed at 08/11/17 0838  Gross per 24 hour  Intake              720 ml  Output                0 ml  Net              720 ml   Filed Weights   08/10/17 0614 08/11/17 0622 08/11/17 0730  Weight: 75.6 kg (166 lb 9.6 oz) 75.4 kg (166 lb 4.8 oz) 75.5 kg (166 lb 7.2 oz)    Examination:  General exam: Appears calm and Comfortable Respiratory system: Bilateral decreased breath sound at bases With scattered crackles. Cardiovascular system: S1 & S2 heard, Rate controlled. Chest wall tenderness present Gastrointestinal system: Abdomen is nondistended, soft and nontender. Normal bowel sounds heard. Extremities: No cyanosis, clubbing; trace edema     Data Reviewed: I have personally reviewed following labs and imaging studies  CBC:  Recent Labs Lab 08/07/17 0759 08/08/17 0758 08/09/17 0546 08/10/17 0401 08/11/17 0800  WBC 9.1 9.3 10.3 13.4* 17.5*  HGB 8.5* 9.0* 8.6* 8.7* 8.0*  HCT 27.9* 28.8* 28.1* 28.6* 25.6*  MCV 86.6 84.5 85.9 85.6 83.9  PLT 591* 658* 630* 597* 604*   Basic Metabolic Panel:  Recent Labs Lab 08/06/17 0952 08/06/17 1734 08/07/17 0759 08/09/17 0546 08/10/17 0401 08/11/17 0353  NA 133*  --  132* 130* 128* 127*  K 4.2  --  4.2 3.8 4.5 6.1*  CL 91*  --  91* 92* 89* 89*    CO2 29  --  GLUCOSE 261*  --  208* 136* 144* 163*  BUN 23*  --  38* 23* 31* 46*  CREATININE 4.43*  --  6.05* 4.37* 5.72* 7.31*  CALCIUM 9.2  --  9.1 8.7* 9.0 9.1  MG  --  2.1  --  2.0 2.0 2.0  PHOS  --  2.9  --   --   --   --    GFR: Estimated Creatinine Clearance: 7.2 mL/min (A) (by C-G formula based on SCr of 7.31 mg/dL (H)). Liver Function Tests:  Recent Labs Lab 08/07/17 0759 08/11/17 0353  AST 30 22  ALT 15 14  ALKPHOS 87 101  BILITOT 0.5 0.5  PROT 6.1* 6.0*  ALBUMIN 2.5* 2.6*   No results for input(s): LIPASE, AMYLASE in the last 168 hours. No results for input(s): AMMONIA in the last 168 hours. Coagulation Profile:  Recent Labs Lab 08/07/17 1608 08/08/17 0758 08/09/17 0546 08/10/17 0401 08/11/17 0353  INR 0.98 1.00 0.98 1.12 1.62   Cardiac Enzymes: No results for input(s): CKTOTAL, CKMB, CKMBINDEX, TROPONINI in the last 168 hours. BNP (last 3  results) No results for input(s): PROBNP in the last 8760 hours. HbA1C: No results for input(s): HGBA1C in the last 72 hours. CBG:  Recent Labs Lab 08/09/17 2105 08/10/17 0745 08/10/17 1230 08/10/17 1642 08/10/17 2133  GLUCAP 180* 169* 158* 257* 221*   Lipid Profile: No results for input(s): CHOL, HDL, LDLCALC, TRIG, CHOLHDL, LDLDIRECT in the last 72 hours. Thyroid Function Tests: No results for input(s): TSH, T4TOTAL, FREET4, T3FREE, THYROIDAB in the last 72 hours. Anemia Panel: No results for input(s): VITAMINB12, FOLATE, FERRITIN, TIBC, IRON, RETICCTPCT in the last 72 hours. Sepsis Labs: No results for input(s): PROCALCITON, LATICACIDVEN in the last 168 hours.  No results found for this or any previous visit (from the past 240 hour(s)).       Radiology Studies: No results found.      Scheduled Meds: . amiodarone  400 mg Oral BID  . amLODipine  10 mg Oral QHS  . aspirin EC  81 mg Oral QHS  . calcium acetate  1,334 mg Oral TID WC  . cinacalcet  60 mg Oral Q M,W,F  .  darbepoetin (ARANESP) injection - DIALYSIS  200 mcg Intravenous Q Fri-HD  . famotidine  20 mg Oral QHS  . feeding supplement (NEPRO CARB STEADY)  237 mL Oral BID BM  . gabapentin  100 mg Oral TID  . insulin aspart  0-5 Units Subcutaneous QHS  . insulin aspart  0-9 Units Subcutaneous TID WC  . insulin glargine  7 Units Subcutaneous QHS  . labetalol  300 mg Oral BID  . latanoprost  1 drop Both Eyes QHS  . mouth rinse  15 mL Mouth Rinse BID  . multivitamin  1 tablet Oral QHS  . ondansetron (ZOFRAN) IV  4 mg Intravenous Once  . pantoprazole  40 mg Oral BID  . Warfarin - Pharmacist Dosing Inpatient   Does not apply q1800   Continuous Infusions:    LOS: 5 days        Glade Lloyd, MD [facility] Pager [phone]  If 7PM-7AM, please contact night-coverage [email or web address] Password [email or web address] 08/11/2017, 11:41 AM

## 2017-08-12 ENCOUNTER — Ambulatory Visit: Payer: Medicare HMO | Admitting: Physician Assistant

## 2017-08-12 LAB — GLUCOSE, CAPILLARY
GLUCOSE-CAPILLARY: 191 mg/dL — AB (ref 65–99)
Glucose-Capillary: 118 mg/dL — ABNORMAL HIGH (ref 65–99)
Glucose-Capillary: 171 mg/dL — ABNORMAL HIGH (ref 65–99)

## 2017-08-12 LAB — COMPREHENSIVE METABOLIC PANEL
ALBUMIN: 2.5 g/dL — AB (ref 3.5–5.0)
ALT: 15 U/L (ref 14–54)
ANION GAP: 13 (ref 5–15)
AST: 20 U/L (ref 15–41)
Alkaline Phosphatase: 88 U/L (ref 38–126)
BILIRUBIN TOTAL: 0.5 mg/dL (ref 0.3–1.2)
BUN: 23 mg/dL — AB (ref 6–20)
CHLORIDE: 95 mmol/L — AB (ref 101–111)
CO2: 25 mmol/L (ref 22–32)
Calcium: 9 mg/dL (ref 8.9–10.3)
Creatinine, Ser: 4.87 mg/dL — ABNORMAL HIGH (ref 0.44–1.00)
GFR calc Af Amer: 9 mL/min — ABNORMAL LOW (ref >60)
GFR calc non Af Amer: 8 mL/min — ABNORMAL LOW (ref >60)
Glucose, Bld: 127 mg/dL — ABNORMAL HIGH (ref 65–99)
POTASSIUM: 4.3 mmol/L (ref 3.5–5.1)
SODIUM: 133 mmol/L — AB (ref 135–145)
TOTAL PROTEIN: 6.2 g/dL — AB (ref 6.5–8.1)

## 2017-08-12 LAB — CBC
HEMATOCRIT: 24.9 % — AB (ref 36.0–46.0)
HEMOGLOBIN: 7.6 g/dL — AB (ref 12.0–15.0)
MCH: 26.2 pg (ref 26.0–34.0)
MCHC: 30.5 g/dL (ref 30.0–36.0)
MCV: 85.9 fL (ref 78.0–100.0)
Platelets: 548 10*3/uL — ABNORMAL HIGH (ref 150–400)
RBC: 2.9 MIL/uL — AB (ref 3.87–5.11)
RDW: 16.4 % — ABNORMAL HIGH (ref 11.5–15.5)
WBC: 12.5 10*3/uL — ABNORMAL HIGH (ref 4.0–10.5)

## 2017-08-12 LAB — MAGNESIUM: Magnesium: 2 mg/dL (ref 1.7–2.4)

## 2017-08-12 LAB — PROTIME-INR
INR: 1.74
PROTHROMBIN TIME: 20.2 s — AB (ref 11.4–15.2)

## 2017-08-12 MED ORDER — WARFARIN SODIUM 5 MG PO TABS: 5.0000 mg | ORAL_TABLET | Freq: Once | ORAL | 0 refills | Status: DC

## 2017-08-12 MED ORDER — AMOXICILLIN-POT CLAVULANATE 500-125 MG PO TABS: 1.0000 | ORAL_TABLET | Freq: Every day | ORAL | 0 refills | Status: AC

## 2017-08-12 MED ORDER — AMIODARONE HCL 400 MG PO TABS: ORAL_TABLET | ORAL | Status: DC

## 2017-08-12 MED ORDER — AMIODARONE HCL 200 MG PO TABS: 200.0000 mg | ORAL_TABLET | Freq: Every day | ORAL | Status: DC

## 2017-08-12 MED ORDER — WARFARIN SODIUM 5 MG PO TABS
5.0000 mg | ORAL_TABLET | Freq: Once | ORAL | Status: AC
  Administered 2017-08-12: 5 mg via ORAL
  Filled 2017-08-12: qty 1

## 2017-08-12 MED ORDER — HYDROCODONE-ACETAMINOPHEN 5-325 MG PO TABS: 1.0000 | ORAL_TABLET | Freq: Four times a day (QID) | ORAL | 0 refills | Status: DC | PRN

## 2017-08-12 MED ORDER — INSULIN GLARGINE 100 UNIT/ML ~~LOC~~ SOLN: 7.0000 [IU] | Freq: Every day | SUBCUTANEOUS | 0 refills | Status: AC

## 2017-08-12 MED ORDER — AMIODARONE HCL 200 MG PO TABS
200.0000 mg | ORAL_TABLET | Freq: Every day | ORAL | 0 refills | Status: AC
Start: 2017-08-13 — End: ?

## 2017-08-12 MED ORDER — "PEN NEEDLES 1/2"" 29G X 12MM MISC": 7.0000 [IU] | Freq: Every day | 0 refills | Status: AC

## 2017-08-12 NOTE — Progress Notes (Signed)
Progress Note  Patient Name: Colleen Mullins Date of Encounter: 08/12/2017  Primary Cardiologist: Dr. Mayford Knife  Subjective   Pt states she is not currently having chest pain, but that it continues to wax and wane.  Inpatient Medications    Scheduled Meds: . amiodarone  400 mg Oral BID  . amLODipine  10 mg Oral QHS  . amoxicillin-clavulanate  1 tablet Oral Daily  . aspirin EC  81 mg Oral QHS  . calcium acetate  1,334 mg Oral TID WC  . cinacalcet  60 mg Oral Q M,W,F  . darbepoetin (ARANESP) injection - DIALYSIS  200 mcg Intravenous Q Fri-HD  . famotidine  20 mg Oral QHS  . feeding supplement (NEPRO CARB STEADY)  237 mL Oral BID BM  . gabapentin  100 mg Oral TID  . insulin aspart  0-5 Units Subcutaneous QHS  . insulin aspart  0-9 Units Subcutaneous TID WC  . insulin glargine  7 Units Subcutaneous QHS  . labetalol  300 mg Oral BID  . latanoprost  1 drop Both Eyes QHS  . mouth rinse  15 mL Mouth Rinse BID  . multivitamin  1 tablet Oral QHS  . ondansetron (ZOFRAN) IV  4 mg Intravenous Once  . pantoprazole  40 mg Oral BID  . warfarin  5 mg Oral ONCE-1800  . Warfarin - Pharmacist Dosing Inpatient   Does not apply q1800   Continuous Infusions:  PRN Meds: acetaminophen **OR** acetaminophen, calcium acetate, cyclobenzaprine, docusate sodium, gi cocktail, HYDROcodone-acetaminophen, metoCLOPramide (REGLAN) injection, ondansetron **OR** ondansetron (ZOFRAN) IV   Vital Signs    Vitals:   08/11/17 1244 08/11/17 1947 08/12/17 0600 08/12/17 1218  BP: 121/78 (!) 120/55 (!) 155/57 (!) 128/42  Pulse: 76 66 65 (!) 59  Resp: Temp:  98.7 F (37.1 C) 98.4 F (36.9 C)   TempSrc:  Oral Oral Oral  SpO2:  100% 97% 97%  Weight:   163 lb 5.8 oz (74.1 kg)   Height:        Intake/Output Summary (Last 24 hours) at 08/12/17 1240 Last data filed at 08/12/17 0934  Gross per 24 hour  Intake              480 ml  Output              100 ml  Net              380 ml   Filed Weights     08/11/17 0730 08/11/17 1150 08/12/17 0600  Weight: 166 lb 7.2 oz (75.5 kg) 159 lb 13.3 oz (72.5 kg) 163 lb 5.8 oz (74.1 kg)     Physical Exam   General: Well developed, well nourished, female appearing in no acute distress. Head: Normocephalic, atraumatic.  Neck: Supple without bruits, no JVD Lungs:  Resp regular and unlabored, CTA. Heart: RR, S1, S2, no murmur; no rub. Abdomen: Soft, non-tender, non-distended with normoactive bowel sounds. No hepatomegaly. No rebound/guarding. No obvious abdominal masses. Extremities: No clubbing, cyanosis, no edema. Distal pedal pulses are 2+ bilaterally. Neuro: Alert and oriented X 3. Moves all extremities spontaneously. Psych: Normal affect.  Labs    Chemistry Recent Labs Lab 08/07/17 0759  08/10/17 0401 08/11/17 0353 08/12/17 0543  NA 132*  < > 128* 127* 133*  K 4.2  < > 4.5 6.1* 4.3  CL 91*  < > 89* 89* 95*  CO2 27  < > GLUCOSE 208*  < >  144* 163* 127*  BUN 38*  < > 31* 46* 23*  CREATININE 6.05*  < > 5.72* 7.31* 4.87*  CALCIUM 9.1  < > 9.0 9.1 9.0  PROT 6.1*  --   --  6.0* 6.2*  ALBUMIN 2.5*  --   --  2.6* 2.5*  AST 30  --   --  22 20  ALT 15  --   --  14 15  ALKPHOS 87  --   --  101 88  BILITOT 0.5  --   --  0.5 0.5  GFRNONAA 6*  < > 7* 5* 8*  GFRAA 7*  < > 8* 6* 9*  ANIONGAP 14  < > < > = values in this interval not displayed.   Hematology Recent Labs Lab 08/10/17 0401 08/11/17 0800 08/12/17 0543  WBC 13.4* 17.5* 12.5*  RBC 3.34* 3.05* 2.90*  HGB 8.7* 8.0* 7.6*  HCT 28.6* 25.6* 24.9*  MCV 85.6 83.9 85.9  MCH 26.0 26.2 26.2  MCHC 30.4 31.3 30.5  RDW 16.0* 16.1* 16.4*  PLT 597* 604* 548*    Cardiac EnzymesNo results for input(s): TROPONINI in the last 168 hours.  Recent Labs Lab 08/06/17 0954 08/06/17 1303  TROPIPOC 0.18* 0.16*     BNPNo results for input(s): BNP, PROBNP in the last 168 hours.   DDimer No results for input(s): DDIMER in the last 168 hours.   Radiology    Dg Chest  2 View  Result Date: 08/11/2017 CLINICAL DATA:  Central chest pain, shortness of breath for 1 day EXAM: CHEST  2 VIEW COMPARISON:  08/06/2017 FINDINGS: There is mild bilateral interstitial thickening. There are trace bilateral pleural effusions. There is right lower lobe airspace disease. There is no pleural effusion or pneumothorax. There is stable cardiomegaly. The osseous structures are unremarkable. IMPRESSION: 1. Bilateral interstitial thickening, trace pleural effusions and right lower lobe airspace disease. These findings may reflect right lower lobe pneumonia versus mild CHF with right lower lobe atelectasis. Electronically Signed   By: Elige Ko   On: 08/11/2017 14:30     Telemetry    NSR - Personally Reviewed  ECG    No new tracings - Personally Reviewed   Cardiac Studies   Echo 08/06/17: Study Conclusions - Left ventricle: The cavity size was normal. There was severe   concentric hypertrophy. Systolic function was vigorous. The   estimated ejection fraction was in the range of 65% to 70%. Wall   motion was normal; there were no regional wall motion   abnormalities. - Aortic valve: Trileaflet; mildly thickened, moderately calcified   leaflets. - Mitral valve: Calcified annulus. Mildly thickened leaflets . - Pericardium, extracardiac: There was no pericardial effusion.  Impressions: - Compared to the prior study, there has been no significant   interval change.  Patient Profile     71 y.o. female with a history of a large pericardial effusion-status post pericardiocentesis. She has moderate CAD   Assessment & Plan    1. Chest pain  - troponin remained flat - likely related to MSK as her symptoms sound atypical; heart cath 2017 with nonobstructive CAD - continue conservative management - no further ischemic workup   2. Hx of recent pericarditis this admission - pericardiocentesis 07/26/17   3. HTN - continue norvasc   4. Paroxysmal atrial  fibrillation - AC has been held due to concerns for bleeding following pericardiocentesis - primary team has restarted coumadin, INR 1.74 - on amiodarone 400 mg  BID x 5 days (08/08/17 - 08/12/17) - she remains in NSR - discharge on 200 mg amiodarone daily   Signed, Marcelino Duster , PA-C 12:40 PM 08/12/2017 Pager: [phone]  Attending Note:   The patient was seen and examined.  Agree with assessment and plan as noted above.  Changes made to the above note as needed.  Patient seen and independently examined with Bettina Gavia, PA .   We discussed all aspects of the encounter. I agree with the assessment and plan as stated above.  1.  PAF - will continue amio 200 mg a day , on coumadin,  Will need INR checked by her primary MD or she can come see [place] in the coumadin clinic  2.   CP - troponins - flat trend.   Not c/w ACS , no further episodes of cP  3.     I have spent a total of 40 minutes with patient reviewing hospital  notes , telemetry, EKGs, labs and examining patient as well as establishing an assessment and plan that was discussed with the patient. > 50% of time was spent in direct patient care.  OK for DC   Vesta Mixer, Montez Hageman., MD, [facility] 08/12/2017, 1:10 PM [address],  [address] Office [phone] Pager [phone]

## 2017-08-12 NOTE — Progress Notes (Signed)
Report called to [facility], spoke to [place]. Awaiting [facility] transportation

## 2017-08-12 NOTE — Progress Notes (Signed)
[facility] at bedside, report given. [facility] has all belongings, printed AVS and printed prescription   Pt IV discontinued and cathter intact, telemetry removed. Pt has all belongings pack and has informed family members.  Pt discharged via [facility] transport

## 2017-08-12 NOTE — Discharge Summary (Addendum)
Physician Discharge Summary  Colleen Mullins ZOX:[id number] DOB: 06/11/1946 DOA: 08/06/2017  PCP: Zoila Shutter, MD  Admit date: 08/06/2017 Discharge date: 08/12/2017  Admitted From: Home  Disposition:  Nursing home  Recommendations for Outpatient Follow-up:  1. Follow up with nursing home provider at earliest convenience with repeat CBC/BMP and INR  2. Follow-up with cardiology/Dr. Mayford Knife in 1-2 weeks 3. Outpatient follow-up with dialysis as scheduled   Home Health: No  Equipment/Devices: None  Discharge Condition: Stable  CODE STATUS: Full  Diet recommendation: Heart Healthy / Carb Modified / renal hemodialysis diet with fluid restriction to 1200 mL a day  Brief/Interim Summary: 71 year old female with history of diabetes, peripheral vascular disease, hyperlipidemia, hypertension, glaucoma, Bell's palsy, GERD, end-stage renal disease on dialysis, CHF, diabetic neuropathy, coronary artery disease and recent hospital admission and discharge from [facility] on 08/01/2017 for pericardial effusion status post pericardiocentesis and new onset atrial fibrillation for which she was started on amiodarone orally but no anticoagulation because of pericardial effusion. She was admitted on 08/06/2017 4 complaints of chest pain and was found to have A. fib with rapid ventricular rate. Cardiology has already evaluated the patient. Patient was started on amiodarone drip which was changed to oral amiodarone. Oral anticoagulation was started as well. Nephrology consultation was also called. Patient also had fever with leukocytosis and chest x-ray showing questionable pneumonia with questionable urinary tract infection; Augmentin was started. Symptoms have improved. She will be discharged to nursing home on 5 more days of Augmentin.    Discharge Diagnoses:  Active Problems:   Type 2 diabetes mellitus with renal complication (HCC)   Elevated troponin   ESRD on dialysis (HCC)   Hypertensive heart  disease with congestive heart failure (HCC)   PVD (peripheral vascular disease) (HCC)   GERD (gastroesophageal reflux disease)   Pericardial effusion   Chest pain   Atrial fibrillation with RVR (HCC)  Abdominal pain and nausea - Questionable cause. Improving. Continue PPI. Use antacids as necessary.  - LFTs normal  - Outpatient GI evaluation if needed if symptoms worsen - Recent gallbladder ultrasound was negative for gallstones or cholecystitis  Fever - Questionable cause. Chest x-ray from 08/11/2017 showed questionable pneumonia versus fluid overload. Urine culture is pending. Augmentin was started on 08/11/2017. White count has improved and patient is afebrile. Discharge patient on 5 more days of Augmentin   A. fib with RVR - Status post amiodarone drip. Currently on oral amiodarone. Heart rate much improved. Cardiology follow-up appreciated. Continue amiodarone 200 mg daily as per cardiology recommendations - Coumadin has been started as per cardiology recommendations. Follow INR. - Outpatient follow-up with cardiology - Pericardial effusion noted during last admission with pericardiocentesis performed. Initial concern the patient's chest pain was related to recurrence of her pericardial effusion. Bedside echo performed showing EF of 65% severe concentric ventricular hypertrophy and no recurrence of pericardial effusion.  Chest pain with elevated troponin - Probably secondary to above versus musculoskeletal. Continue oral pain medications - Troponin did not increase significantly. - Patient has history of non obstructive coronary artery disease. Outpatient follow-up  Hypotension:  - Likely due to a combination of A. fib with RVR and fluid depletion secondary to dialysis.  - Resolved. - Blood pressure stable. Continue labetalol and amlodipine. Restart other antihypertensives as an outpatient if blood pressure remains an issue.  ESRD on hemodialysis - Nephrology following. -  Continue outpatient Dialysis per nephrology  DM: - Lantus with sliding scale insulin  Chronic pain: - continue neurontin, flexeril,  norco  Generalized deconditioning: - Continue PT evaluation in nursing home - Discharge to nursing home today if bed is available  Discharge Instructions  Discharge Instructions    Ambulatory referral to Cardiology    Complete by:  As directed    Call MD for:  difficulty breathing, headache or visual disturbances    Complete by:  As directed    Call MD for:  hives    Complete by:  As directed    Call MD for:  persistant dizziness or light-headedness    Complete by:  As directed    Call MD for:  persistant nausea and vomiting    Complete by:  As directed    Call MD for:  severe uncontrolled pain    Complete by:  As directed    Call MD for:  temperature >100.4    Complete by:  As directed    Diet - low sodium heart healthy    Complete by:  As directed    Diet Carb Modified    Complete by:  As directed    Discharge instructions    Complete by:  As directed    Renal HD diet with fluid restriction to per day   Increase activity slowly    Complete by:  As directed      Allergies as of 08/12/2017      Reactions   Iodinated Diagnostic Agents Hives, Itching   Lactose Intolerance (gi) Other (See Comments)   Abdominal pains and bloating      Medication List    STOP taking these medications   cloNIDine 0.2 MG tablet Commonly known as:  CATAPRES   hydrALAZINE 50 MG tablet Commonly known as:  APRESOLINE   insulin glargine 100 unit/mL Sopn Commonly known as:  LANTUS Replaced by:  insulin glargine 100 UNIT/ML injection   isosorbide mononitrate 30 MG 24 hr tablet Commonly known as:  IMDUR   losartan 100 MG tablet Commonly known as:  COZAAR     TAKE these medications   acetaminophen 650 MG CR tablet Commonly known as:  TYLENOL Take 650 mg by mouth every 8 (eight) hours as needed for pain.   amiodarone 200 MG tablet Commonly  known as:  PACERONE Take 1 tablet (200 mg total) by mouth daily. What changed:  medication strength  how much to take  when to take this   amLODipine 10 MG tablet Commonly known as:  NORVASC Take 10 mg by mouth at bedtime.   amoxicillin-clavulanate 500-125 MG tablet Commonly known as:  AUGMENTIN Take 1 tablet (500 mg total) by mouth daily.   aspirin EC 81 MG tablet Take 81 mg by mouth at bedtime.   calcium acetate 667 MG capsule Commonly known as:  PHOSLO Take 667-1,334 mg by mouth See admin instructions. Take 2 capsules (1334 mg) by mouth 3 times daily with meals and 1 capsule (667 mg) with snacks   cinacalcet 60 MG tablet Commonly known as:  SENSIPAR Take 60 mg by mouth daily. Monday, Wednesday and Friday   cyclobenzaprine 5 MG tablet Commonly known as:  FLEXERIL Take 5 mg by mouth 3 (three) times daily as needed for muscle spasms.   esomeprazole 40 MG capsule Commonly known as:  NEXIUM Take 40 mg by mouth at bedtime.   ethyl chloride spray Apply 1 application topically daily as needed (dialysis). For use at dialysis   famotidine 20 MG tablet Commonly known as:  PEPCID Take 1 tablet (20 mg total) by mouth at bedtime.  feeding supplement (NEPRO CARB STEADY) Liqd Take 237 mLs by mouth 2 (two) times daily between meals.   gabapentin 100 MG capsule Commonly known as:  NEURONTIN Take 1 capsule (100 mg total) by mouth 3 (three) times daily.   HYDROcodone-acetaminophen 5-325 MG tablet Commonly known as:  NORCO Take 1 tablet by mouth every 6 (six) hours as needed for moderate pain. What changed:  when to take this   insulin glargine 100 UNIT/ML injection Commonly known as:  LANTUS Inject 0.07 mLs (7 Units total) into the skin at bedtime. Replaces:  insulin glargine 100 unit/mL Sopn   labetalol 300 MG tablet Commonly known as:  NORMODYNE Take 300 mg by mouth 2 (two) times daily.   NOVOLOG FLEXPEN 100 UNIT/ML FlexPen Generic drug:  insulin aspart Inject  0-12 Units into the skin See admin instructions. 70-120=0 units, 121-150=2 units, 151-200=3 units, 201-250=5 units, 251-300=8 units, 301-350=11 units, 351-400=12 units   ondansetron 4 MG tablet Commonly known as:  ZOFRAN Take 1 tablet (4 mg total) by mouth every 8 (eight) hours as needed for nausea or vomiting.   OXYGEN Inhale 3 L/min into the lungs as needed (shortness of breath).   PEN NEEDLES 29GX1/2" 29G X Misc 7 Units by Does not apply route at bedtime. What changed:  how much to take   RENAL-VITE 0.8 MG Tabs Take 1 tablet by mouth every evening. Vitamin B complex-vitamin C folic acid   Travoprost (BAK Free) 0.004 % Soln ophthalmic solution Commonly known as:  TRAVATAN Place 1 drop into both eyes at bedtime.   warfarin 5 MG tablet Commonly known as:  COUMADIN Take 1 tablet (5 mg total) by mouth one time only at 6 PM.            Discharge Care Instructions        Start     Ordered   08/13/17 0000  amoxicillin-clavulanate (AUGMENTIN) 500-125 MG tablet  Daily     08/12/17 1058   08/13/17 0000  amiodarone (PACERONE) 200 MG tablet  Daily     08/12/17 1425   08/12/17 0000  insulin glargine (LANTUS) 100 UNIT/ML injection  Daily at bedtime     08/12/17 1058   08/12/17 0000  HYDROcodone-acetaminophen (NORCO) 5-325 MG tablet  Every 6 hours PRN     08/12/17 1058   08/12/17 0000  Insulin Pen Needle (PEN NEEDLES 29GX1/2") 29G X MISC  Daily at bedtime     08/12/17 1058   08/12/17 0000  warfarin (COUMADIN) 5 MG tablet  One time only - 1800     08/12/17 1058   08/12/17 0000  Increase activity slowly     08/12/17 1058   08/12/17 0000  Diet - low sodium heart healthy     08/12/17 1058   08/12/17 0000  Diet Carb Modified     08/12/17 1058   08/12/17 0000  Discharge instructions    Comments:  Renal HD diet with fluid restriction to per day   08/12/17 1058   08/12/17 0000  Call MD for:  temperature >100.4     08/12/17 1058   08/12/17 0000  Call MD for:   persistant nausea and vomiting     08/12/17 1058   08/12/17 0000  Call MD for:  severe uncontrolled pain     08/12/17 1058   08/12/17 0000  Call MD for:  difficulty breathing, headache or visual disturbances     08/12/17 1058   08/12/17 0000  Call MD for:  hives     08/12/17 1058   08/12/17 0000  Call MD for:  persistant dizziness or light-headedness     08/12/17 1058   08/12/17 0000  Ambulatory referral to Cardiology     08/12/17 1100       Contact information for follow-up providers    Quintella Reichert, MD Follow up in 2 week(s).   Specialty:  Cardiology Why:  Follow up in 1-2 weeks. Call for appointment Contact information: [address] [place] [address] [phone]            Contact information for after-discharge care    Destination    [facility] Follow up.   Specialty:  Skilled Nursing Facility Contact information: [address] [place] [address] [phone]                 Allergies  Allergen Reactions  . Iodinated Diagnostic Agents Hives and Itching  . Lactose Intolerance (Gi) Other (See Comments)    Abdominal pains and bloating    Consultations:  Cardiology and nephrology   Procedures/Studies: Ct Abdomen Pelvis Wo Contrast  Result Date: 07/26/2017 CLINICAL DATA:  Mid abdominal pain with nausea and vomiting. End-stage renal disease, dialysis dependent. EXAM: CT ABDOMEN AND PELVIS WITHOUT CONTRAST TECHNIQUE: Multidetector CT imaging of the abdomen and pelvis was performed following the standard protocol without IV contrast. COMPARISON:  07/26/2017 chest x-ray 07/30/2015 CT without contrast FINDINGS: Lower chest: Large pericardial effusion. Small pleural effusions, slightly larger on the left. Aorta is atherosclerotic. Streaky bibasilar atelectasis. Degenerative changes of the lower thoracic spine. Hepatobiliary: Limited assessment without IV contrast. Diffuse periportal edema evident  with small amount of perihepatic ascites on the right. Similar pattern of scattered small indeterminate hepatic hypodensities all measuring 10 mm or less in size, suspect small scattered hepatic cysts. Gallbladder demonstrates wall thickening and pericholecystic fluid. No surrounding inflammation. However, difficult to exclude cholecystitis. Consider correlation with ultrasound. No gross biliary obstruction. Gallbladder findings can also be seen with chronic hepatic disease. Pancreas: Limited assess without contrast. No large focal abnormality or ductal dilatation. No surrounding fluid collection or inflammation. Spleen: Normal in size without focal abnormality. Adrenals/Urinary Tract: Normal adrenal glands for age. Kidneys demonstrate chronic perinephric strandy edema. Nonobstructing intrarenal calculi bilaterally in the lower poles. Renal vascular calcifications also evident. No acute obstructive uropathy or hydronephrosis. Negative for hydroureter or ureteral obstruction. Urinary bladder collapsed. Stomach/Bowel: Negative for bowel obstruction, significant dilatation, ileus, or free air. Scattered colonic diverticulosis. Appendix is unremarkable. No fluid collection or abscess. Moderate stool burden throughout the colon. Vascular/Lymphatic: Extensive calcific atherosclerosis of the aorta and iliac vessels. No retroperitoneal hemorrhage or hematoma. Left femoral access graft noted. On the right, the right internal iliac artery continues as a persistent right sciatic artery, normal arterial variant anatomy. Reproductive: Remote hysterectomy. No adnexal mass or abnormality. No fluid collection or abscess. Other: No inguinal or abdominal wall hernia. Musculoskeletal: Degenerative changes of the spine SI joints. Vacuum disc phenomena at L4-5 and L5-S1. No acute osseous finding. IMPRESSION: Large pericardial effusion. Small pleural effusions with bibasilar atelectasis Periportal hepatic edema and small amount of  perihepatic ascites with probable scattered small hepatic cysts. No biliary obstruction. Gallbladder wall thickening suspected with pericholecystic fluid. Difficult to exclude cholecystitis. Consider ultrasound correlation. Gallbladder findings can also be seen with chronic hepatic disease. Nonobstructing nephrolithiasis Extensive aortoiliac atherosclerosis Persistent right sciatic artery noted, normal vascular variant. Electronically Signed   By: Judie Petit.  Shick M.D.  On: 07/26/2017 09:28   Dg Chest 2 View  Result Date: 08/11/2017 CLINICAL DATA:  Central chest pain, shortness of breath for 1 day EXAM: CHEST  2 VIEW COMPARISON:  08/06/2017 FINDINGS: There is mild bilateral interstitial thickening. There are trace bilateral pleural effusions. There is right lower lobe airspace disease. There is no pleural effusion or pneumothorax. There is stable cardiomegaly. The osseous structures are unremarkable. IMPRESSION: 1. Bilateral interstitial thickening, trace pleural effusions and right lower lobe airspace disease. These findings may reflect right lower lobe pneumonia versus mild CHF with right lower lobe atelectasis. Electronically Signed   By: Elige Ko   On: 08/11/2017 14:30   Ct Head Wo Contrast  Result Date: 07/26/2017 CLINICAL DATA:  71 year old female with right side deficit. EXAM: CT HEAD WITHOUT CONTRAST TECHNIQUE: Contiguous axial images were obtained from the base of the skull through the vertex without intravenous contrast. COMPARISON:  CT head 03/25/2017 and earlier. FINDINGS: Brain: No acute intracranial hemorrhage identified. No midline shift, mass effect, or evidence of intracranial mass lesion. No ventriculomegaly. Patchy bilateral white matter hypodensity, most pronounced about the left frontal horn, appears stable. No cortically based acute infarct identified. Vascular: Calcified atherosclerosis at the skull base. No suspicious intracranial vascular hyperdensity. Skull: Chronic hyperostosis of the  skull. No acute osseous abnormality identified. Sinuses/Orbits: Visualized paranasal sinuses and mastoids are stable and well pneumatized. Other: No acute orbit, scalp, or visible face soft tissue abnormality. ASPECTS [facility] Stroke Program Early CT Score) - Ganglionic level infarction (caudate, lentiform nuclei, internal capsule, insula, M1-M3 cortex): 7 - Supraganglionic infarction (M4-M6 cortex): 3 Total score (0-10 with 10 being normal): 10 IMPRESSION: 1. Chronic white matter disease. No acute cortically based infarct or intracranial hemorrhage identified. 2. ASPECTS is 10. 3. The above was relayed via text pager to Dr. Arther Dames on 07/26/2017 at 21:51 . Electronically Signed   By: Odessa Fleming M.D.   On: 07/26/2017 21:51   Mr Maxine Glenn Head Wo Contrast  Result Date: 07/26/2017 CLINICAL DATA:  71 y/o  F; code stroke with right-sided deficits. EXAM: MRI HEAD WITHOUT CONTRAST MRA HEAD WITHOUT CONTRAST TECHNIQUE: Multiplanar, multiecho pulse sequences of the brain and surrounding structures were obtained without intravenous contrast. Angiographic images of the head were obtained using MRA technique without contrast. COMPARISON:  07/26/2017 CT of the head. FINDINGS: MRI HEAD FINDINGS Brain: No acute infarction, hemorrhage, hydrocephalus, extra-axial collection or mass lesion. Few nonspecific foci of T2 FLAIR hyperintense signal abnormality in subcortical and periventricular white matter is compatible with mild chronic microvascular ischemic changes for age. Moderate brain parenchymal volume loss. Vascular: As below. Skull and upper cervical spine: Normal marrow signal. Sinuses/Orbits: Trace mastoid effusions. Small left maxillary sinus mucous retention cyst. Bilateral intra-ocular lens replacement. Other: None. MRA HEAD FINDINGS Internal carotid arteries:  Patent. Anterior cerebral arteries:  Patent. Middle cerebral arteries: Patent. Anterior communicating artery: Patent. Posterior communicating arteries:  Patent.  Posterior cerebral arteries:  Patent. Basilar artery:  Patent. Vertebral arteries:  Patent. No evidence of high-grade stenosis, large vessel occlusion, or aneurysm. IMPRESSION: MRI head: 1. No acute intracranial abnormality identified. 2. Mild for age chronic microvascular ischemic changes and moderate parenchymal volume loss of the brain. 3. Trace mastoid effusions. MRA head: Negative MRA of the head. No large vessel occlusion, aneurysm, or significant stenosis. Electronically Signed   By: Mitzi Hansen M.D.   On: 07/26/2017 22:56   Mr Brain Wo Contrast  Result Date: 07/26/2017 CLINICAL DATA:  71 y/o  F; code stroke with right-sided  deficits. EXAM: MRI HEAD WITHOUT CONTRAST MRA HEAD WITHOUT CONTRAST TECHNIQUE: Multiplanar, multiecho pulse sequences of the brain and surrounding structures were obtained without intravenous contrast. Angiographic images of the head were obtained using MRA technique without contrast. COMPARISON:  07/26/2017 CT of the head. FINDINGS: MRI HEAD FINDINGS Brain: No acute infarction, hemorrhage, hydrocephalus, extra-axial collection or mass lesion. Few nonspecific foci of T2 FLAIR hyperintense signal abnormality in subcortical and periventricular white matter is compatible with mild chronic microvascular ischemic changes for age. Moderate brain parenchymal volume loss. Vascular: As below. Skull and upper cervical spine: Normal marrow signal. Sinuses/Orbits: Trace mastoid effusions. Small left maxillary sinus mucous retention cyst. Bilateral intra-ocular lens replacement. Other: None. MRA HEAD FINDINGS Internal carotid arteries:  Patent. Anterior cerebral arteries:  Patent. Middle cerebral arteries: Patent. Anterior communicating artery: Patent. Posterior communicating arteries:  Patent. Posterior cerebral arteries:  Patent. Basilar artery:  Patent. Vertebral arteries:  Patent. No evidence of high-grade stenosis, large vessel occlusion, or aneurysm. IMPRESSION: MRI head: 1. No  acute intracranial abnormality identified. 2. Mild for age chronic microvascular ischemic changes and moderate parenchymal volume loss of the brain. 3. Trace mastoid effusions. MRA head: Negative MRA of the head. No large vessel occlusion, aneurysm, or significant stenosis. Electronically Signed   By: Mitzi Hansen M.D.   On: 07/26/2017 22:56   Dg Chest Portable 1 View  Result Date: 08/06/2017 CLINICAL DATA:  Chest pain.  Renal failure.  Atrial fibrillation EXAM: PORTABLE CHEST 1 VIEW COMPARISON:  July 26, 2017 FINDINGS: There is atelectatic change in the left lung base region. There is no frank edema or consolidation. There is cardiomegaly. The pulmonary vascularity is within normal limits. There is aortic atherosclerosis. No evident bone lesions. No pneumothorax. IMPRESSION: Left base atelectasis. No frank edema or consolidation. Stable cardiac enlargement. There is aortic atherosclerosis. Aortic Atherosclerosis (ICD10-I70.0). Electronically Signed   By: Bretta Bang III M.D.   On: 08/06/2017 10:16   Dg Chest Port 1 View  Result Date: 07/26/2017 CLINICAL DATA:  RIGHT chest pain. Dialysis patient. Pt is a dialysis pt who is supposed to have dialysis tomorrow. Pt reports that her dialysis port is bleeding and it was not bleeding after dialysis Wednesday, states entire right side is hurting, right chest area. Hx of CAD. Diabetes. HTN, PAD, DM2 EXAM: PORTABLE CHEST 1 VIEW COMPARISON:  06/29/2017 FINDINGS: Cardiac silhouette is enlarged. Silhouette is enlarged compared to 06/29/2017. No effusion or infiltrate. No pulmonary edema. No pneumothorax for IMPRESSION: Increase cardiac silhouette compared to 06/29/2017. Differential includes cardiomegaly versus pericardial effusion. Electronically Signed   By: Genevive Bi M.D.   On: 07/26/2017 08:37   US Abdomen Limited Ruq  Result Date: 07/27/2017 CLINICAL DATA:  71 year old who presented yesterday with mid abdominal pain, nausea and  vomiting. CT yesterday questioned gallbladder wall thickening. Ultrasound requested to evaluate for possible cholecystitis EXAM: ULTRASOUND ABDOMEN LIMITED RIGHT UPPER QUADRANT COMPARISON:  CT abdomen and pelvis yesterday.  No prior ultrasound. FINDINGS: Gallbladder: No evidence of gallbladder wall thickening as questioned on yesterday's CT. No shadowing gallstones or gallbladder sludge. Minimal free fluid surrounding the gallbladder. Negative sonographic Murphy's sign according to the ultrasound technologist. Common bile duct: Diameter: Approximately 2 mm. Liver: Coarsened echotexture diffusely without focal hepatic parenchymal abnormality. Portal vein is patent on color Doppler imaging with normal direction of blood flow towards the liver. IMPRESSION: 1. No sonographic evidence of cholecystitis. No evidence of gallbladder wall thickening as questioned on yesterday's CT. 2. Coarsened hepatic echotexture indicating steatosis and/or hepatocellular disease. No  focal hepatic parenchymal abnormality. Electronically Signed   By: Hulan Saas M.D.   On: 07/27/2017 09:32    Limited echo on 08/06/2017: Study Conclusions  - Left ventricle: The cavity size was normal. There was severe concentric hypertrophy. Systolic function was vigorous. The estimated ejection fraction was in the range of 65% to 70%. Wall motion was normal; there were no regional wall motion abnormalities. - Aortic valve: Trileaflet; mildly thickened, moderately calcified leaflets. - Mitral valve: Calcified annulus. Mildly thickened leaflets . - Pericardium, extracardiac: There was no pericardial effusion.  Impressions:  - Compared to the prior study, there has been no significant interval change.   Subjective: Patient seen and examined at bedside. She feels better. She denies any overnight fever, nausea or vomiting.  Discharge Exam: Vitals:   08/11/17 1947 08/12/17 0600  BP: (!) 120/55 (!) 155/57  Pulse: 66  65  Resp: 20 18  Temp: 98.7 F (37.1 C) 98.4 F (36.9 C)  SpO2: 100% 97%   Vitals:   08/11/17 1150 08/11/17 1244 08/11/17 1947 08/12/17 0600  BP: 118/65 121/78 (!) 120/55 (!) 155/57  Pulse: 84 76 66 65  Resp: 18 19 20 18   Temp: 99.2 F (37.3 C)  98.7 F (37.1 C) 98.4 F (36.9 C)  TempSrc: Oral  Oral Oral  SpO2: 95%  100% 97%  Weight: 72.5 kg (159 lb 13.3 oz)   74.1 kg (163 lb 5.8 oz)  Height:        General: Pt is alert, awake, not in acute distress Cardiovascular: Rate controlled, S1/S2 + Respiratory: Bilateral decreased breath sounds at bases with some scattered crackles Abdominal: Soft, NT, ND, bowel sounds + Extremities: Trace edema, no cyanosis    The results of significant diagnostics from this hospitalization (including imaging, microbiology, ancillary and laboratory) are listed below for reference.     Microbiology: Recent Results (from the past 240 hour(s))  Culture, blood (routine x 2)     Status: None (Preliminary result)   Collection Time: 08/11/17  1:51 PM  Result Value Ref Range Status   Specimen Description BLOOD LEFT ARM  Final   Special Requests IN PEDIATRIC BOTTLE Blood Culture adequate volume  Final   Culture PENDING  Incomplete   Report Status PENDING  Incomplete     Labs: BNP (last 3 results) No results for input(s): BNP in the last 8760 hours. Basic Metabolic Panel:  Recent Labs Lab 08/06/17 1734 08/07/17 0759 08/09/17 0546 08/10/17 0401 08/11/17 0353 08/12/17 0543  NA  --  132* 130* 128* 127* 133*  K  --  4.2 3.8 4.5 6.1* 4.3  CL  --  91* 92* 89* 89* 95*  CO2  --  27 25 27 23 25   GLUCOSE  --  208* 136* 144* 163* 127*  BUN  --  38* 23* 31* 46* 23*  CREATININE  --  6.05* 4.37* 5.72* 7.31* 4.87*  CALCIUM  --  9.1 8.7* 9.0 9.1 9.0  MG 2.1  --  2.0 2.0 2.0 2.0  PHOS 2.9  --   --   --   --   --    Liver Function Tests:  Recent Labs Lab 08/07/17 0759 08/11/17 0353 08/12/17 0543  AST 30 22 20   ALT 15 14 15   ALKPHOS 87 101 88   BILITOT 0.5 0.5 0.5  PROT 6.1* 6.0* 6.2*  ALBUMIN 2.5* 2.6* 2.5*   No results for input(s): LIPASE, AMYLASE in the last 168 hours. No results for input(s): AMMONIA in the  last 168 hours. CBC:  Recent Labs Lab 08/08/17 0758 08/09/17 0546 08/10/17 0401 08/11/17 0800 08/12/17 0543  WBC 9.3 10.3 13.4* 17.5* 12.5*  HGB 9.0* 8.6* 8.7* 8.0* 7.6*  HCT 28.8* 28.1* 28.6* 25.6* 24.9*  MCV 84.5 85.9 85.6 83.9 85.9  PLT 658* 630* 597* 604* 548*   Cardiac Enzymes: No results for input(s): CKTOTAL, CKMB, CKMBINDEX, TROPONINI in the last 168 hours. BNP: Invalid input(s): POCBNP CBG:  Recent Labs Lab 08/10/17 2133 08/11/17 1242 08/11/17 1633 08/11/17 2048 08/12/17 0739  GLUCAP 221* 135* 291* 132* 118*   D-Dimer No results for input(s): DDIMER in the last 72 hours. Hgb A1c No results for input(s): HGBA1C in the last 72 hours. Lipid Profile No results for input(s): CHOL, HDL, LDLCALC, TRIG, CHOLHDL, LDLDIRECT in the last 72 hours. Thyroid function studies No results for input(s): TSH, T4TOTAL, T3FREE, THYROIDAB in the last 72 hours.  Invalid input(s): FREET3 Anemia work up No results for input(s): VITAMINB12, FOLATE, FERRITIN, TIBC, IRON, RETICCTPCT in the last 72 hours. Urinalysis    Component Value Date/Time   COLORURINE AMBER (A) 08/11/2017 2148   APPEARANCEUR CLOUDY (A) 08/11/2017 2148   LABSPEC 1.017 08/11/2017 2148   PHURINE 5.0 08/11/2017 2148   GLUCOSEU NEGATIVE 08/11/2017 2148   HGBUR MODERATE (A) 08/11/2017 2148   BILIRUBINUR NEGATIVE 08/11/2017 2148   KETONESUR NEGATIVE 08/11/2017 2148   PROTEINUR 100 (A) 08/11/2017 2148   UROBILINOGEN 0.2 12/11/2012 0719   NITRITE NEGATIVE 08/11/2017 2148   LEUKOCYTESUR LARGE (A) 08/11/2017 2148   Sepsis Labs Invalid input(s): PROCALCITONIN,  WBC,  LACTICIDVEN Microbiology Recent Results (from the past 240 hour(s))  Culture, blood (routine x 2)     Status: None (Preliminary result)   Collection Time: 08/11/17  1:51 PM   Result Value Ref Range Status   Specimen Description BLOOD LEFT ARM  Final   Special Requests IN PEDIATRIC BOTTLE Blood Culture adequate volume  Final   Culture PENDING  Incomplete   Report Status PENDING  Incomplete     Time coordinating discharge:35 minutes  SIGNED:   Glade Lloyd, MD  [facility] 08/12/2017, 11:00 AM Pager: [phone]  If 7PM-7AM, please contact night-coverage [email or web address] Password [email or web address]

## 2017-08-12 NOTE — Progress Notes (Signed)
[facility] Progress Note   Dialysis Orders: AF MWF 4h 71.5kg Hep none RUA AVG -Mircera 100 mcg IV Q 2 weeks (last dose 07/16/17) -Hectorol 1 mcg IV TIW -Calicium Acetate 667 mg 2 caps PO TID AC 1 w/snack -Sensipar 60 mg PO MWF  Assessment/Plan: 1. ESRD - HD MWF K down to 4.3 from 6.1 Monday; HD Monday if still here - continue to lower volume 2. Chest Pain - trop's flat, normal ECHO/ per cards CP likely musculoskeletal 3. Afib RVR - started on coumadin, po amio/ noted per Card. "hadnot been on anticoagulation due to concern about her pericarditis. Not critical to reach therapeutic INR before DC" 4. Recent cardiac tamponade/ effusion - no rub 5. HTN/volume - norvasc/ labetalol - decrease volume - net UF 3 L 9/17 post wt 72.5 CXR Monday  Post HD showed R LLPNA vs mild CHF RLL- still above EDW WBC up to 17.5 Monday down to 12.5  Try for more volume Wednesday if still here. 6. Anemia of ESRD - hgb 8.7>8> 7.6  - drop may be dilutional Aranesp 200 given 9/14 ( increased dose from op Unit) 7. Secondary hyperparathyroidism - Iv Hec , po sensipar on hd , calcium acetate binder  8. Nutrition - alb 2.5- diet/nepro/vits 9. Disp - SNF placement 10. Leukocytosis/Tmax 99.2  - BC and urine culture pending CXR as per #5 - on Augmentin 11. LLQ pain - had abdominal CT 9/1 -remote hysterectomy, no adenexal mass or abnormality; no fluid collection or abscess. Asked her to inform PCP of this - she said he hadn' t been in yet this am.  Sheffield Slider, PA-C  [facility] [phone] 08/12/2017,10:05 AM  LOS: 6 days   Pt seen, examined and agree w A/P as above.  Vinson Moselle MD [facility] pager [phone]   08/12/2017, 1:59 PM    Subjective:   Reports RLQ pain - started since she got here - no N, V, D, constipation.  Had a similar pain years ago.  A GYN removed a mass and she has been fine since.  This reminds her of this. Uses O2 prn at home for  ambulating long distances  Objective Vitals:   08/11/17 1150 08/11/17 1244 08/11/17 1947 08/12/17 0600  BP: 118/65 121/78 (!) 120/55 (!) 155/57  Pulse: 84 76 66 65  Resp: Temp: 99.2 F (37.3 C)  98.7 F (37.1 C) 98.4 F (36.9 C)  TempSrc: Oral  Oral Oral  SpO2: 95%  100% 97%  Weight: 72.5 kg (159 lb 13.3 oz)   74.1 kg (163 lb 5.8 oz)  Height:       Physical Exam General: NAD Heart: RRR no rub Lungs: right LL poor expansion/crackles; left clear Abdomen: soft + BS, RLQ tenderness - no ascites no palpable masses Extremities: no LE edema Dialysis Access right upper AVGG + bruit   Additional Objective Labs: Lab Results  Component Value Date   INR 1.74 08/12/2017   INR 1.62 08/11/2017   INR 1.12 08/10/2017    Basic Metabolic Panel:  Recent Labs Lab 08/06/17 1734  08/10/17 0401 08/11/17 0353 08/12/17 0543  NA  --   < > 128* 127* 133*  K  --   < > 4.5 6.1* 4.3  CL  --   < > 89* 89* 95*  CO2  --   < > GLUCOSE  --   < > 144* 163* 127*  BUN  --   < >  31* 46* 23*  CREATININE  --   < > 5.72* 7.31* 4.87*  CALCIUM  --   < > 9.0 9.1 9.0  PHOS 2.9  --   --   --   --   < > = values in this interval not displayed. Liver Function Tests:  Recent Labs Lab 08/07/17 0759 08/11/17 0353 08/12/17 0543  AST ALT ALKPHOS 87 101 88  BILITOT 0.5 0.5 0.5  PROT 6.1* 6.0* 6.2*  ALBUMIN 2.5* 2.6* 2.5*   No results for input(s): LIPASE, AMYLASE in the last 168 hours. CBC:  Recent Labs Lab 08/08/17 0758 08/09/17 0546 08/10/17 0401 08/11/17 0800 08/12/17 0543  WBC 9.3 10.3 13.4* 17.5* 12.5*  HGB 9.0* 8.6* 8.7* 8.0* 7.6*  HCT 28.8* 28.1* 28.6* 25.6* 24.9*  MCV 84.5 85.9 85.6 83.9 85.9  PLT 658* 630* 597* 604* 548*   Blood Culture    Component Value Date/Time   SDES BLOOD LEFT ARM 08/11/2017 1351   SPECREQUEST IN PEDIATRIC BOTTLE Blood Culture adequate volume 08/11/2017 1351   CULT PENDING 08/11/2017 1351   REPTSTATUS PENDING  08/11/2017 1351    Cardiac Enzymes: No results for input(s): CKTOTAL, CKMB, CKMBINDEX, TROPONINI in the last 168 hours. CBG:  Recent Labs Lab 08/10/17 2133 08/11/17 1242 08/11/17 1633 08/11/17 2048 08/12/17 0739  GLUCAP 221* 135* 291* 132* 118*   Iron Studies: No results for input(s): IRON, TIBC, TRANSFERRIN, FERRITIN in the last 72 hours. Lab Results  Component Value Date   INR 1.74 08/12/2017   INR 1.62 08/11/2017   INR 1.12 08/10/2017   Studies/Results: Dg Chest 2 View  Result Date: 08/11/2017 CLINICAL DATA:  Central chest pain, shortness of breath for 1 day EXAM: CHEST  2 VIEW COMPARISON:  08/06/2017 FINDINGS: There is mild bilateral interstitial thickening. There are trace bilateral pleural effusions. There is right lower lobe airspace disease. There is no pleural effusion or pneumothorax. There is stable cardiomegaly. The osseous structures are unremarkable. IMPRESSION: 1. Bilateral interstitial thickening, trace pleural effusions and right lower lobe airspace disease. These findings may reflect right lower lobe pneumonia versus mild CHF with right lower lobe atelectasis. Electronically Signed   By: Elige Ko   On: 08/11/2017 14:30   Medications:  . amiodarone  400 mg Oral BID  . amLODipine  10 mg Oral QHS  . amoxicillin-clavulanate  1 tablet Oral Daily  . aspirin EC  81 mg Oral QHS  . calcium acetate  1,334 mg Oral TID WC  . cinacalcet  60 mg Oral Q M,W,F  . darbepoetin (ARANESP) injection - DIALYSIS  200 mcg Intravenous Q Fri-HD  . famotidine  20 mg Oral QHS  . feeding supplement (NEPRO CARB STEADY)  237 mL Oral BID BM  . gabapentin  100 mg Oral TID  . insulin aspart  0-5 Units Subcutaneous QHS  . insulin aspart  0-9 Units Subcutaneous TID WC  . insulin glargine  7 Units Subcutaneous QHS  . labetalol  300 mg Oral BID  . latanoprost  1 drop Both Eyes QHS  . mouth rinse  15 mL Mouth Rinse BID  . multivitamin  1 tablet Oral QHS  . ondansetron (ZOFRAN) IV  4 mg  Intravenous Once  . pantoprazole  40 mg Oral BID  . Warfarin - Pharmacist Dosing Inpatient   Does not apply [phone]

## 2017-08-12 NOTE — Clinical Social Work Note (Signed)
CSW facilitated patient discharge including contacting patient family and facility to confirm patient discharge plans. Clinical information faxed to facility and family agreeable with plan. CSW arranged ambulance transport via [facility] to [facility]. RN to call report prior to discharge ([phone]).  CSW will sign off for now as social work intervention is no longer needed. Please consult us again if new needs arise.  Nattie Lazenby, CSW [phone]   

## 2017-08-12 NOTE — Progress Notes (Signed)
ANTICOAGULATION CONSULT NOTE - Follow Up Consult  Pharmacy Consult for warfarin Indication: atrial fibrillation  Allergies  Allergen Reactions  . Iodinated Diagnostic Agents Hives and Itching  . Lactose Intolerance (Gi) Other (See Comments)    Abdominal pains and bloating    Patient Measurements: Height:  (167.6 cm) Weight: 163 lb 5.8 oz (74.1 kg) (scale a) IBW/kg (Calculated) : 59.3  Vital Signs: Temp: 98.4 F (36.9 C) (09/18 0600) Temp Source: Oral (09/18 0600) BP: 155/57 (09/18 0600) Pulse Rate: 65 (09/18 0600)  Labs:  Recent Labs  08/10/17 0401 08/11/17 0353 08/11/17 0800 08/12/17 0543  HGB 8.7*  --  8.0* 7.6*  HCT 28.6*  --  25.6* 24.9*  PLT 597*  --  604* 548*  LABPROT 14.3 19.1*  --  20.2*  INR 1.12 1.62  --  1.74  CREATININE 5.72* 7.31*  --   --     Estimated Creatinine Clearance: 7.2 mL/min (A) (by C-G formula based on SCr of 7.31 mg/dL (H)).   Assessment: 89 yof admitted with recent diagnosis of atrial fibrillation; however, no anticoagulation prior to admission due to pericardial effusion during hospitalization early this month. No evidence of pericardial effusion this admission.  Now on warfarin for Afib. Cardiology does not want any bridging. Warfarin started on 9/13. INR remains subtherapeutic today at 1.74, but trending up. H/H trend down slightly again today, plts 548. Patient has had some slight bleeding from HD sites historically; however, no signs/symptoms of bleeding noted. RN denies signs/symptoms of bleeding today.    Goal of Therapy:  INR 2-3 Monitor platelets by anticoagulation protocol: Yes   Plan:  Give warfarin 5 mg po x 1 Monitor daily INR, CBC, clinical course, s/sx of bleed, PO intake, DDI   Thank you for allowing [place] to participate in this patients care.  Signe Colt, PharmD Clinical phone for 08/12/2017 from 7a-3:30p: [phone] If after 3:30p, please call main pharmacy at: [phone] 08/12/2017 7:17 AM

## 2017-08-12 NOTE — Care Management Important Message (Signed)
Important Message  Patient Details  Name: Colleen Mullins MRN: [id number] Date of Birth: May 29, 1946   Medicare Important Message Given:  Yes    Colleen Mullins 08/12/2017, 10:57 AM

## 2017-08-12 NOTE — Progress Notes (Signed)
Occupational Therapy Treatment Patient Details Name: Colleen Mullins MRN: [id number] DOB: 1946/10/15 Today's Date: 08/12/2017    History of present illness Pt is a 71 y/o female admitted secondary to chest pain. Cardiology consulted and attributed chest pain to musculoskeletal (due to an accident earlier this year). PMH including but not limited to a-fib, CAD, CHF, ESRD on dialysis, DM, HTN, PVD and Bell's Palsy.   OT comments  Pt progressing well in mobility and activity tolerance. Required sitting at sink to complete oral care after standing x 3 minutes. Instructed in energy conservation strategies. Pt continue to be appropriate for further rehab at SNF.  Follow Up Recommendations  SNF;Supervision/Assistance - 24 hour    Equipment Recommendations       Recommendations for Other Services      Precautions / Restrictions Precautions Precautions: Fall       Mobility Bed Mobility Overal bed mobility: Modified Independent             General bed mobility comments: increased time, HOB up  Transfers Overall transfer level: Needs assistance Equipment used: Rolling walker (2 wheeled) Transfers: Sit to/from Stand Sit to Stand: Min guard         General transfer comment: increased time, cues for hand placement    Balance Overall balance assessment: Needs assistance   Sitting balance-Leahy Scale: Good Sitting balance - Comments: no LOB with donning socks     Standing balance-Leahy Scale: Poor                             ADL either performed or assessed with clinical judgement   ADL Overall ADL's : Needs assistance/impaired     Grooming: Oral care;Sitting;Standing;Min guard           Upper Body Dressing : Supervision/safety;Sitting   Lower Body Dressing: Min guard;Sit to/from stand   Toilet Transfer: Ambulation;RW;Min guard;BSC   Toileting- Architect and Hygiene: Min guard;Sit to/from stand       Functional mobility during ADLs:  Min guard;Rolling walker General ADL Comments: pt with 02 sat to 86% on RA, maintains 93% on 2L, instructed in pacing and pursed lip breathing techniques     Vision       Perception     Praxis      Cognition Arousal/Alertness: Awake/alert Behavior During Therapy: WFL for tasks assessed/performed Overall Cognitive Status: No family/caregiver present to determine baseline cognitive functioning Area of Impairment: Safety/judgement                         Safety/Judgement: Decreased awareness of safety;Decreased awareness of deficits              Exercises     Shoulder Instructions       General Comments      Pertinent Vitals/ Pain       Pain Assessment: No/denies pain  Home Living                                          Prior Functioning/Environment              Frequency  Min 2X/week        Progress Toward Goals  OT Goals(current goals can now be found in the care plan section)  Progress towards OT goals: Progressing toward goals  Acute Rehab OT  Goals Patient Stated Goal: to get stronger OT Goal Formulation: With patient/family Time For Goal Achievement: 08/23/17 Potential to Achieve Goals: Good  Plan Discharge plan remains appropriate    Co-evaluation                 AM-PAC PT "6 Clicks" Daily Activity     Outcome Measure   Help from another person eating meals?: None Help from another person taking care of personal grooming?: A Little Help from another person toileting, which includes using toliet, bedpan, or urinal?: A Little Help from another person bathing (including washing, rinsing, drying)?: A Little Help from another person to put on and taking off regular upper body clothing?: None Help from another person to put on and taking off regular lower body clothing?: A Little 6 Click Score: 20    End of Session Equipment Utilized During Treatment: Rolling walker;Oxygen;Gait belt  OT Visit Diagnosis:  Unsteadiness on feet (R26.81)   Activity Tolerance Patient tolerated treatment well   Patient Left in chair;with call bell/phone within reach;with chair alarm set   Nurse Communication          Time: 4782-9562 OT Time Calculation (min): 18 min  Charges: OT General Charges $OT Visit: 1 Visit OT Treatments $Self Care/Home Management : 23-37 mins  08/12/2017 Colleen Mullins, OTR/L Pager: [phone]   Colleen Mullins Colleen Mullins 08/12/2017, 12:21 PM

## 2017-08-12 NOTE — Progress Notes (Deleted)
Cardiology Office Note    Date:  08/12/2017   ID:  Colleen Mullins, DOB 02-12-1946, MRN [id number]  PCP:  Zoila Shutter, MD  Cardiologist: Dr. Anne Fu  No chief complaint on file.   History of Present Illness:  Colleen Mullins is a 71 y.o. female with a hx of ASCAD with cath showing 60% D1 and 50% PL off the RCA by cath 06/2016 on medical management, DM, ESRD on HD (M/W/F), GERD, HTN and hyperlipidemia Who was discharged from the hospital     Past Medical History:  Diagnosis Date  . A-fib (HCC)   . Abnormal nuclear stress test 07/10/2016  . Anemia    low iron  . Anemia in chronic renal disease 07/08/2016  . Arthritis   . CAD in native artery 08/12/2016   a. Nonobstructive ASCAD by cath with 60% D1 and 50% PL off RCA and 25% mid LAD.  . Diabetes mellitus    type 2  . Diabetic retinopathy (HCC)   . Dialysis patient [facility])    M-W-F @ [facility]  . Diastolic dysfunction   . Dyspnea on exertion 04/14/2016  . Ejection fraction   . Elevated troponin 10/16/2015  . ESRD on hemodialysis [facility])    Dialysis T/Th/Sa  . GERD (gastroesophageal reflux disease)   . Glaucoma   . H/O: Bell's palsy 2007  . Hyperlipidemia   . Hypertension    a. labile - hx of BP dropping at HD.  . Macular degeneration   . Musculoskeletal chest pain   . PAD (peripheral artery disease) (HCC)    a. ABI 08/2016: mod RLE disease, normal L ABI.  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 PVD (peripheral vascular disease) (HCC) 03/18/2017  . Type 2 diabetes mellitus with renal complication (HCC) 09/26/2011    Past Surgical History:  Procedure Laterality Date  . ABDOMINAL HYSTERECTOMY    . ARTERIOVENOUS GRAFT PLACEMENT     Left forearm x 2, one removed in March  . ARTERIOVENOUS GRAFT PLACEMENT     Left forearm  . AVGG REMOVAL  10/08/2011   Procedure: REMOVAL OF ARTERIOVENOUS GORETEX GRAFT (AVGG);  Surgeon: Sherren Kerns, MD;  Location: [facility] OR;  Service: Vascular;  Laterality: Left;  . BTL     . CARDIAC CATHETERIZATION N/A 07/10/2016   Procedure: Left Heart Cath and Coronary Angiography;  Surgeon: Yvonne Kendall, MD;  Location[facility] INVASIVE CV LAB;  Service: Cardiovascular;  Laterality: N/A;  . COLONOSCOPY    . EXCHANGE OF A DIALYSIS CATHETER Right 03/27/2016   Procedure: EXCHANGE OF A DIALYSIS CATHETER;  Surgeon: Fransisco Hertz, MD;  Location: [facility] OR;  Service: Vascular;  Laterality: Right;  . EYE SURGERY Left    Lasik surgery on left. Cataract surgery on both eyes  . LAPAROTOMY  ?2000   Pelvic mass (Benign)  . PERICARDIOCENTESIS N/A 07/26/2017   Procedure: PERICARDIOCENTESIS;  Surgeon: Corky Crafts, MD;  Location: [facility] INVASIVE CV LAB;  Service: Cardiovascular;  Laterality: N/A;  . REVISION OF ARTERIOVENOUS GORETEX GRAFT Left 03/27/2016   Procedure: REVISION OF LEFT ARTERIOVENOUS GORETEX GRAFT;  Surgeon: Fransisco Hertz, MD;  Location: [facility] OR;  Service: Vascular;  Laterality: Left;  . TONSILLECTOMY      Current Medications: No outpatient prescriptions have been marked as taking for the 08/12/17 encounter (Appointment) with Dyann Kief, PA-C.     Allergies:   Iodinated diagnostic agents and Lactose intolerance (gi)   Social History   Social History  . Marital status:  Widowed    Spouse name: N/A  . Number of children: N/A  . Years of education: N/A   Occupational History  . retired Audiological scientist    Social History Main Topics  . Smoking status: Former Smoker    Types: Cigarettes    Quit date: 08/04/1999  . Smokeless tobacco: Never Used  . Alcohol use No  . Drug use: No  . Sexual activity: No   Other Topics Concern  . Not on file   Social History Narrative   Admitted to [facility] 06/12/17   Widowed   Former smoker-stopped 2000   Alcohol none   Full Code     Family History:  The patient's ***family history includes Breast cancer in her sister; COPD in her father; Diabetes in her father, mother, and sister; Heart disease in her mother; Hypertension  in her father; Lung disease in her father.   ROS:   Please see the history of present illness.    ROS All other systems reviewed and are negative.   PHYSICAL EXAM:   VS:  There were no vitals taken for this visit.  Physical Exam  GEN: Well nourished, well developed, in no acute distress  HEENT: normal  Neck: no JVD, carotid bruits, or masses Cardiac:RRR; no murmurs, rubs, or gallops  Respiratory:  clear to auscultation bilaterally, normal work of breathing GI: soft, nontender, nondistended, + BS Ext: without cyanosis, clubbing, or edema, Good distal pulses bilaterally MS: no deformity or atrophy  Skin: warm and dry, no rash Neuro:  Alert and Oriented x 3, Strength and sensation are intact Psych: euthymic mood, full affect  Wt Readings from Last 3 Encounters:  08/12/17 163 lb 5.8 oz (74.1 kg)  08/05/17 158 lb 4.6 oz (71.8 kg)  08/01/17 158 lb 4.6 oz (71.8 kg)      Studies/Labs Reviewed:   EKG:  EKG is*** ordered today.  The ekg ordered today demonstrates ***  Recent Labs: 07/28/2017: TSH 1.498 08/12/2017: ALT 15; BUN 23; Creatinine, Ser 4.87; Hemoglobin 7.6; Magnesium 2.0; Platelets 548; Potassium 4.3; Sodium 133   Lipid Panel    Component Value Date/Time   CHOL 146 03/25/2017 0747   TRIG 140 03/25/2017 0747   HDL 41 03/25/2017 0747   CHOLHDL 3.6 03/25/2017 0747   CHOLHDL 4.0 12/30/2016 0342   VLDL 44 (H) 12/30/2016 0342   LDLCALC 77 03/25/2017 0747    Additional studies/ records that were reviewed today include:  ***    ASSESSMENT:    No diagnosis found.   PLAN:  In order of problems listed above:      Medication Adjustments/Labs and Tests Ordered: Current medicines are reviewed at length with the patient today.  Concerns regarding medicines are outlined above.  Medication changes, Labs and Tests ordered today are listed in the Patient Instructions below. There are no Patient Instructions on file for this visit.   Elson Clan, PA-C    08/12/2017 11:08 AM    [facility] [address] [place], [place], [place]  [address] Phone: [phone]; Fax: [phone]

## 2017-08-12 NOTE — Clinical Social Work Note (Addendum)
CSW faxed updated PT note to [facility]. CSW called the authorization line and it has not been started yet. CSW left another Engineer, technical sales for [employer].  Charlynn Court, CSW [phone]  11:19 am Authorization still has not been started. CSW left a voicemail on cell phone of [employer]. Patient's daughter, Myrene Buddy, updated.  Charlynn Court, CSW [phone]  11:44 pm CSW spoke with [place] at [facility]. She stated the SNF has to call or go online and initiate the case before they can start the authorization. Admissions coordinator notified.  Charlynn Court, CSW [phone]  12:52 pm [employer] would not allow SNF to initiate the case since CSW faxed clinicals. CSW called and built case. Pending case number is [id number]. CSW faxed updated OT note.  Charlynn Court, CSW [phone]  1:08 pm CSW received call from RN at [facility] asking for initial clinicals. CSW explained that they had been faxed over yesterday. RN found them attached to another case. CSW faxed all clinicals again so they would all be together.  Charlynn Court, CSW [phone]  3:15 pm Authorization still under review.  Charlynn Court, CSW [phone]  3:26 pm Authorization approved: [id number].  Charlynn Court, CSW [phone]

## 2017-08-13 LAB — URINE CULTURE

## 2017-08-14 ENCOUNTER — Non-Acute Institutional Stay (SKILLED_NURSING_FACILITY): Payer: Medicare HMO | Admitting: Internal Medicine

## 2017-08-14 ENCOUNTER — Encounter: Payer: Self-pay | Admitting: Internal Medicine

## 2017-08-14 DIAGNOSIS — R102 Pelvic and perineal pain: Secondary | ICD-10-CM | POA: Diagnosis not present

## 2017-08-14 DIAGNOSIS — R9389 Abnormal findings on diagnostic imaging of other specified body structures: Secondary | ICD-10-CM | POA: Insufficient documentation

## 2017-08-14 DIAGNOSIS — R079 Chest pain, unspecified: Secondary | ICD-10-CM | POA: Diagnosis not present

## 2017-08-14 DIAGNOSIS — I4891 Unspecified atrial fibrillation: Secondary | ICD-10-CM | POA: Diagnosis not present

## 2017-08-14 DIAGNOSIS — R938 Abnormal findings on diagnostic imaging of other specified body structures: Secondary | ICD-10-CM | POA: Diagnosis not present

## 2017-08-14 NOTE — Assessment & Plan Note (Addendum)
08/14/17 she continues to have intermittent substernal chest pain, by history this was not responsive to nitroglycerin while hospitalized Cardiology follow-up

## 2017-08-14 NOTE — Progress Notes (Signed)
NURSING HOME LOCATION:  [facility] ROOM NUMBER:  [id number]  CODE STATUS:  Full Code  PCP:  Zoila Shutter, MD  [address] [place] [address]   This is a nursing facility follow up for Nursing Facility readmission within 30 days  Interim medical record and care since last [facility] visit was updated with review of diagnostic studies and change in clinical status since last visit were documented.  HPI: The patient was hospitalized 9/12-9/18/18 for chest pain in the context of A. fib with rapid ventricular response. Amiodarone drip was initiated with subsequent transition to oral amiodarone. Oral anticoagulation with warfarin was initiated as well.She questions why she is not still on "blood thinner", order was apparently canceled inadvertently at readmission to the SNF. The patient had leukocytosis and fever. Chest x-ray suggested possible pneumonia and urine suggested possible UTI.  The 9/17 PA and lateral films were reviewed. There is irregular diffuse airspace disease in right lower lobe worrisome for pneumonia. There is also evidence of associated effusions. She does have cardiomegaly. Urine Culture revealed multiple species, the urine was not recultured. This was treated with Augmentin orally which was to be continued until 9/24. The patient had recently been hospitalized for pericardial effusion for which pericardiocentesis was performed. At that time she had new onset atrial fibrillation and was started on amiodarone orally. No anticoagulation was administered at that time because of pericardial effusion. At discharge labs revealed glucose 127, creatinine 4.87 albumin 2.5, total protein 6.2, GFR 9, and normochromic, normocytic anemia with hemoglobin 7.6/hematocrit 24.9. PT/INR was 1.74. She is on basal insulin as well as mealtime insulin, glucoses have ranged from 164-384 since return to the SNF.  Significant history includes diabetes, peripheral  vascular disease, dyslipidemia, hypertension, and end-stage renal disease for which she receives dialysis.  Review of systems: She does have dyspepsia despite taking Nexium. She continues to have intermittent sharp substernal chest pain without associated radiation, nausea or sweating. Nitroglycerin the hospital was of no benefit. She also has been having some sharp intermittent pain lasting seconds & nonradiating in the vaginal area. She states that she had similar pain 2007. At that time a benign pelvic mass was excised at [facility] she has had a hysterectomy and bilateral salpingo-oophorectomy. She still has her appendix. Despite the x-ray findings, she denies any cardiopulmonary or upper respiratory tract symptoms other than the chest pain. She denies any bleeding dyscrasias but as noted the warfarin was inadvertently stopped apparently.  Constitutional: No fever,significant weight change, fatigue  Eyes: No redness, discharge, pain, vision change ENT/mouth: No nasal congestion,  purulent discharge, earache,change in hearing ,sore throat  Cardiovascular: No palpitations,paroxysmal nocturnal dyspnea, claudication, edema  Respiratory: No cough, sputum production,hemoptysis, DOE , significant snoring,apnea  Gastrointestinal: No dysphagia, nausea / vomiting,rectal bleeding, melena,change in bowels Genitourinary: No dysuria,hematuria, pyuria,  incontinence, nocturia,vaginal discharge Musculoskeletal: No joint stiffness, joint swelling, weakness,pain Dermatologic: No rash, pruritus, change in appearance of skin Neurologic: No dizziness,headache,syncope, seizures, numbness , tingling Psychiatric: No significant anxiety , depression, insomnia, anorexia Endocrine: No change in hair/skin/ nails, excessive thirst, excessive hunger, excessive urination  Hematologic/lymphatic: No significant bruising, lymphadenopathy,abnormal bleeding Allergy/immunology: No itchy/ watery eyes, significant sneezing, urticaria,  angioedema  Physical exam:  Pertinent or positive findings: Slight proptosis suggested on the left. Nasolabial fold on the left is decreased slightly. She has an asymmetric smile. Bilateral temporal wasting is noted. Patchy alopecia. Pupils are small. She has an upper plate. Grade 1.5 systolic murmur present at  the base. The carotid pulses are strong. She has mild bilateral low-grade rhonchi. Pedal pulses are decreased. She has atrophy of the thighs. The left lower extremity is weaker than the right.  General appearance:Adequately nourished; no acute distress , increased work of breathing is present.   Lymphatic: No lymphadenopathy about the head, neck, axilla . Eyes: No conjunctival inflammation or lid edema is present. There is no scleral icterus. Ears:  External ear exam shows no significant lesions or deformities.   Nose:  External nasal examination shows no deformity or inflammation. Nasal mucosa are pink and moist without lesions ,exudates Oral exam: lips and gums are healthy appearing.There is no oropharyngeal erythema or exudate . Neck:  No thyromegaly, masses, tenderness noted.    Heart:  Normal rate and regular rhythm. S1 and S2 normal without gallop,  click, rub .  Lungs: without wheezes,rales , rubs. Abdomen:Bowel sounds are normal. Abdomen is soft and nontender with no organomegaly, hernias,masses. GU: deferred  Extremities:  No cyanosis, clubbing,edema  Neurologic exam : Balance,Rhomberg,finger to nose testing could not be completed due to clinical state Skin: Warm & dry w/o tenting. No significant lesions or rash.  See summary under each active problem in the Problem List with associated updated therapeutic plan

## 2017-08-14 NOTE — Assessment & Plan Note (Addendum)
She'll contact Dr. Coralee Pesa, PCP, to schedule a gynecologic consultation after discharge from [facility] 9/28 pursue consultation ASAP if pain persists or progresses

## 2017-08-14 NOTE — Assessment & Plan Note (Signed)
  Incentive spirometry every hour while awake; continue Augmentin until 9/24

## 2017-08-14 NOTE — Progress Notes (Addendum)
Page to [facility] with Cardiology to confirm that IP notes are being interpreted correctly.   Confirmed interpretation of NP and MD note.   Pt is to be followed by PCP or facility MD, with recurring INR checks to maintain therapeutic level, 2-3. Pt was subtherapeutic at coumadin restart per [place],NP confirming 1.74.   Summary, pt is to be receiving warfarin anticoagulation therapy at [facility].   This message relayed to nurse, Herbert Seta at [facility] on 08/14/17 at 1:07 PM.  Per Herbert Seta, pt has already received coumadin, MD at facility has placed orders accordingly.  Call placed to daughter, Myrene Buddy, at [phone]; states she just spoke with Herbert Seta as well.   Everyone is on the same page, pt is receiving coumadin. No other concerns from family or facility.

## 2017-08-14 NOTE — Patient Instructions (Signed)
See assessment and plan under each diagnosis in the problem list and acutely for this visit 

## 2017-08-14 NOTE — Assessment & Plan Note (Addendum)
Clinically in regular rhythm with controlled rate Restart warfarin which was inadvertently stopped  at admission to SNF

## 2017-08-16 LAB — CULTURE, BLOOD (ROUTINE X 2)
Culture: NO GROWTH
Culture: NO GROWTH
SPECIAL REQUESTS: ADEQUATE
Special Requests: ADEQUATE

## 2017-08-18 LAB — POCT INR: INR: 2.4 — AB (ref 0.9–1.1)

## 2017-08-18 LAB — PROTIME-INR: PROTIME: 25.4 — AB (ref 10.0–13.8)

## 2017-08-22 ENCOUNTER — Other Ambulatory Visit: Payer: Self-pay

## 2017-08-22 LAB — POCT INR: INR: 1.8 — AB (ref 0.9–1.1)

## 2017-08-22 LAB — PROTIME-INR: Protime: 20.6 — AB (ref 10.0–13.8)

## 2017-08-22 MED ORDER — HYDROCODONE-ACETAMINOPHEN 5-325 MG PO TABS: 1.0000 | ORAL_TABLET | Freq: Four times a day (QID) | ORAL | 0 refills | Status: AC | PRN

## 2017-08-22 NOTE — Telephone Encounter (Signed)
Faxed to [facility] Fax #[phone]  

## 2017-08-26 ENCOUNTER — Encounter: Payer: Self-pay | Admitting: Internal Medicine

## 2017-08-26 ENCOUNTER — Non-Acute Institutional Stay (SKILLED_NURSING_FACILITY): Payer: Medicare HMO | Admitting: Internal Medicine

## 2017-08-26 DIAGNOSIS — R6889 Other general symptoms and signs: Secondary | ICD-10-CM

## 2017-08-26 DIAGNOSIS — R0902 Hypoxemia: Secondary | ICD-10-CM | POA: Diagnosis not present

## 2017-08-26 DIAGNOSIS — Z992 Dependence on renal dialysis: Secondary | ICD-10-CM | POA: Diagnosis not present

## 2017-08-26 DIAGNOSIS — R0789 Other chest pain: Secondary | ICD-10-CM | POA: Diagnosis not present

## 2017-08-26 DIAGNOSIS — N186 End stage renal disease: Secondary | ICD-10-CM | POA: Diagnosis not present

## 2017-08-26 NOTE — Assessment & Plan Note (Addendum)
Trial of topical anti-inflammatory agent  imaging to include possible bone scan if symptoms are unresponsive or if they progress
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
## 2017-08-26 NOTE — Patient Instructions (Signed)
See assessment and plan under each diagnosis in the problem list and acutely for this visit 

## 2017-09-01 NOTE — Progress Notes (Deleted)
Cardiology Office Note    Date:  09/01/2017   ID:  Colleen Mullins, DOB 06-Mar-1946, MRN [id number]  PCP:  Zoila Shutter, MD  Cardiologist: Dr. Mayford Knife  No chief complaint on file.   History of Present Illness:  Colleen Mullins is a 71 y.o. female with history of hypertension, nonobstructive CAD, CHF, ESRD on HD with a recent admission for pericardial effusion status post pericardiocentesis and new onset atrial fibrillation treated with amiodarone but no anticoagulation because of pericardial effusion 08/01/17. She was readmitted with complaints of chest pain 08/06/17 and found to have A. fib with RVR. She was started on amiodarone drip and change to oral amiodarone and Coumadin.. She also had question of pneumonia and UTI and was placed on antibiotics. Troponins were flat and bedside echo showed EF of 65% with severe concentric hypertrophy but no recurrence of pericardial effusion.    Past Medical History:  Diagnosis Date  . A-fib (HCC)   . Abnormal nuclear stress test 07/10/2016  . Anemia    low iron  . Anemia in chronic renal disease 07/08/2016  . Arthritis   . CAD in native artery 08/12/2016   a. Nonobstructive ASCAD by cath with 60% D1 and 50% PL off RCA and 25% mid LAD.  . Diabetes mellitus    type 2  . Diabetic retinopathy (HCC)   . Dialysis patient [facility])    M-W-F @ [facility]  . Diastolic dysfunction   . Dyspnea on exertion 04/14/2016  . Ejection fraction   . Elevated troponin 10/16/2015  . ESRD on hemodialysis [facility])    Dialysis T/Th/Sa  . GERD (gastroesophageal reflux disease)   . Glaucoma   . H/O: Bell's palsy 2007  . Hyperlipidemia   . Hypertension    a. labile - hx of BP dropping at HD.  . Macular degeneration   . Musculoskeletal chest pain   . PAD (peripheral artery disease) (HCC)    a. ABI 08/2016: mod RLE disease, normal L ABI.  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 PVD (peripheral vascular disease) (HCC) 03/18/2017  . Type 2 diabetes  mellitus with renal complication (HCC) 09/26/2011    Past Surgical History:  Procedure Laterality Date  . ABDOMINAL HYSTERECTOMY    . ARTERIOVENOUS GRAFT PLACEMENT     Left forearm x 2, one removed in March  . ARTERIOVENOUS GRAFT PLACEMENT     Left forearm  . AVGG REMOVAL  10/08/2011   Procedure: REMOVAL OF ARTERIOVENOUS GORETEX GRAFT (AVGG);  Surgeon: Sherren Kerns, MD;  Location: [facility] OR;  Service: Vascular;  Laterality: Left;  . BTL    . CARDIAC CATHETERIZATION N/A 07/10/2016   Procedure: Left Heart Cath and Coronary Angiography;  Surgeon: Yvonne Kendall, MD;  Location: [facility] INVASIVE CV LAB;  Service: Cardiovascular;  Laterality: N/A;  . COLONOSCOPY    . EXCHANGE OF A DIALYSIS CATHETER Right 03/27/2016   Procedure: EXCHANGE OF A DIALYSIS CATHETER;  Surgeon: Fransisco Hertz, MD;  Location: [facility] OR;  Service: Vascular;  Laterality: Right;  . EYE SURGERY Left    Lasik surgery on left. Cataract surgery on both eyes  . LAPAROTOMY  ?2000   Pelvic mass (Benign)  . PERICARDIOCENTESIS N/A 07/26/2017   Procedure: PERICARDIOCENTESIS;  Surgeon: Corky Crafts, MD;  Location: [facility] INVASIVE CV LAB;  Service: Cardiovascular;  Laterality: N/A;  . REVISION OF ARTERIOVENOUS GORETEX GRAFT Left 03/27/2016   Procedure: REVISION OF LEFT ARTERIOVENOUS GORETEX GRAFT;  Surgeon: Fransisco Hertz, MD;  Location: [facility]  OR;  Service: Vascular;  Laterality: Left;  . TONSILLECTOMY      Current Medications: No outpatient prescriptions have been marked as taking for the 09/02/17 encounter (Appointment) with Dyann Kief, PA-C.     Allergies:   Iodinated diagnostic agents and Lactose intolerance (gi)   Social History   Social History  . Marital status: Widowed    Spouse name: N/A  . Number of children: N/A  . Years of education: N/A   Occupational History  . retired Audiological scientist    Social History Main Topics  . Smoking status: Former Smoker    Types: Cigarettes    Quit date: 08/04/1999  . Smokeless tobacco: Never  Used  . Alcohol use No  . Drug use: No  . Sexual activity: No   Other Topics Concern  . Not on file   Social History Narrative   Admitted to [facility] 06/12/17   Widowed   Former smoker-stopped 2000   Alcohol none   Full Code     Family History:  The patient's ***family history includes Breast cancer in her sister; COPD in her father; Diabetes in her father, mother, and sister; Heart disease in her mother; Hypertension in her father; Lung disease in her father.   ROS:   Please see the history of present illness.    ROS All other systems reviewed and are negative.   PHYSICAL EXAM:   VS:  There were no vitals taken for this visit.  Physical Exam  GEN: Well nourished, well developed, in no acute distress  HEENT: normal  Neck: no JVD, carotid bruits, or masses Cardiac:RRR; no murmurs, rubs, or gallops  Respiratory:  clear to auscultation bilaterally, normal work of breathing GI: soft, nontender, nondistended, + BS Ext: without cyanosis, clubbing, or edema, Good distal pulses bilaterally MS: no deformity or atrophy  Skin: warm and dry, no rash Neuro:  Alert and Oriented x 3, Strength and sensation are intact Psych: euthymic mood, full affect  Wt Readings from Last 3 Encounters:  08/26/17 163 lb 5.8 oz (74.1 kg)  08/14/17 163 lb 5.8 oz (74.1 kg)  08/12/17 163 lb 5.8 oz (74.1 kg)      Studies/Labs Reviewed:   EKG:  EKG is*** ordered today.  The ekg ordered today demonstrates ***  Recent Labs: 07/28/2017: TSH 1.498 08/12/2017: ALT 15; BUN 23; Creatinine, Ser 4.87; Hemoglobin 7.6; Magnesium 2.0; Platelets 548; Potassium 4.3; Sodium 133   Lipid Panel    Component Value Date/Time   CHOL 146 03/25/2017 0747   TRIG 140 03/25/2017 0747   HDL 41 03/25/2017 0747   CHOLHDL 3.6 03/25/2017 0747   CHOLHDL 4.0 12/30/2016 0342   VLDL 44 (H) 12/30/2016 0342   LDLCALC 77 03/25/2017 0747    Additional studies/ records that were reviewed today include:  Echo  08/06/17: Study Conclusions - Left ventricle: The cavity size was normal. There was severe   concentric hypertrophy. Systolic function was vigorous. The   estimated ejection fraction was in the range of 65% to 70%. Wall   motion was normal; there were no regional wall motion   abnormalities. - Aortic valve: Trileaflet; mildly thickened, moderately calcified   leaflets. - Mitral valve: Calcified annulus. Mildly thickened leaflets . - Pericardium, extracardiac: There was no pericardial effusion.   Impressions: - Compared to the prior study, there has been no significant   interval change.  Echo 9/1/18Study Conclusions   - Left ventricle: The cavity size was normal. Wall thickness was  increased in a pattern of severe LVH. Systolic function was   normal. The estimated ejection fraction was in the range of 55%   to 60%. Wall motion was normal; there were no regional wall   motion abnormalities. - Mitral valve: Calcified annulus. - Left atrium: The atrium was severely dilated. - Pericardium, extracardiac: A large pericardial effusion was   identified.   Impressions:   - Normal LV systolic function; severe LVH; large pericardial   effusion with RV diastolic collapse and dilated IVC; findings c/w   tamponade physiology.   Conclusion     Successful pericardiocentesis from the subxiphoid approach with improved hemodynamics.   Will leave the drain in place and remove based on f/u echo and amount of continued drainage.     Cardiac catheterization 07/10/16 Conclusion  1.  Mild to moderate, non-obstructive coronary artery disease.  No angiographic findings to explain patient's atypical chest pain or myocardial perfusion stress test findings. 2.  Normal left ventricular filling pressure.   Plan: 1.  Continue medical management and secondary prevention of CAD.       ASSESSMENT:    No diagnosis found.   PLAN:  In order of problems listed above:      Medication  Adjustments/Labs and Tests Ordered: Current medicines are reviewed at length with the patient today.  Concerns regarding medicines are outlined above.  Medication changes, Labs and Tests ordered today are listed in the Patient Instructions below. There are no Patient Instructions on file for this visit.   Signed, Jacolyn Reedy, PA-C  09/01/2017 3:27 PM    [facility] [address] [place], [place], [place]  [address] Phone: [phone]; Fax: [phone]

## 2017-09-02 ENCOUNTER — Ambulatory Visit: Payer: Medicare HMO | Admitting: Physician Assistant

## 2017-09-25 DEATH — deceased

## 2019-02-23 IMAGING — US US ABDOMEN LIMITED
1 series · 14 of 25 positions shown · non-contrast
Comparison: CT abdomen and pelvis yesterday.  No prior ultrasound.

CLINICAL DATA: 71-year-old who presented yesterday with mid
abdominal pain, nausea and vomiting. CT yesterday questioned
gallbladder wall thickening. Ultrasound requested to evaluate for
possible cholecystitis

EXAM:
ULTRASOUND ABDOMEN LIMITED RIGHT UPPER QUADRANT

[Series 1: us abdomen limited · 0.25mm/px · 14 of 43 slices shown]
[im 1/43]
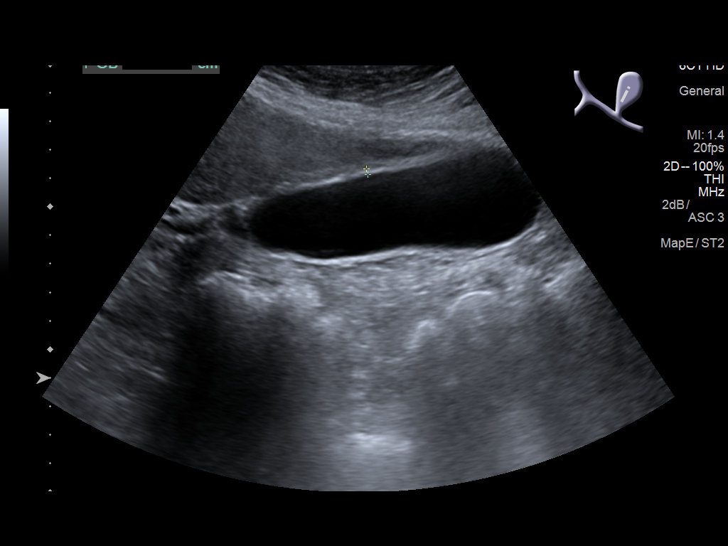
[im 4/43]
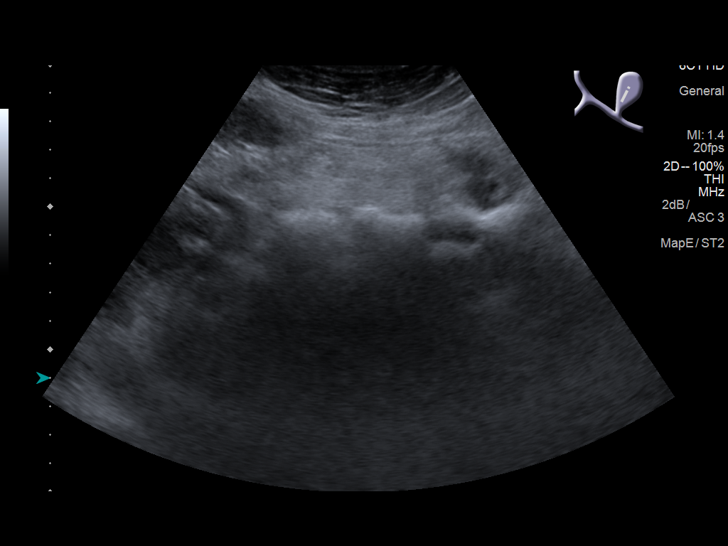
[im 8/43]
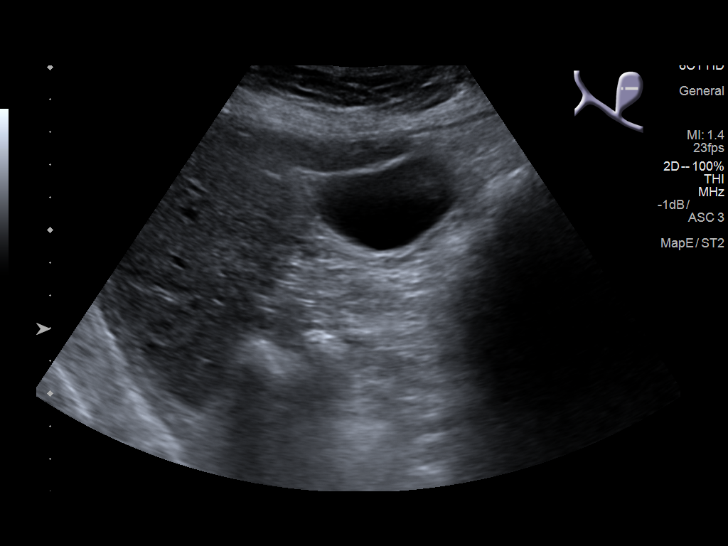
[im 11/43]
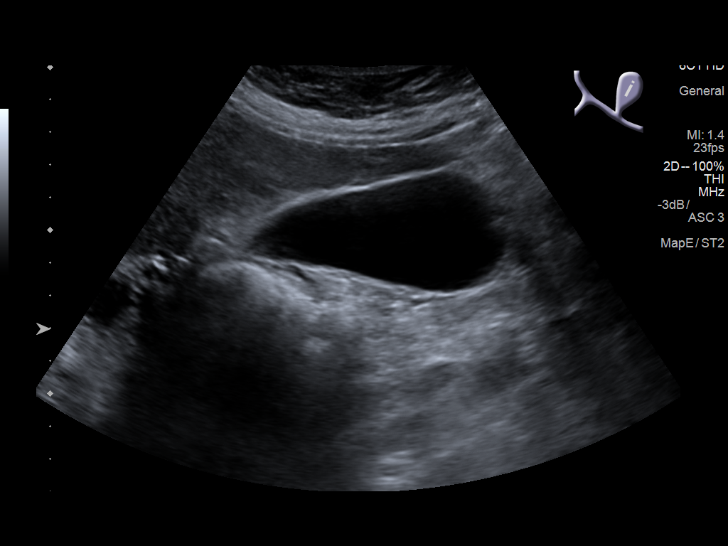
[im 15/43]
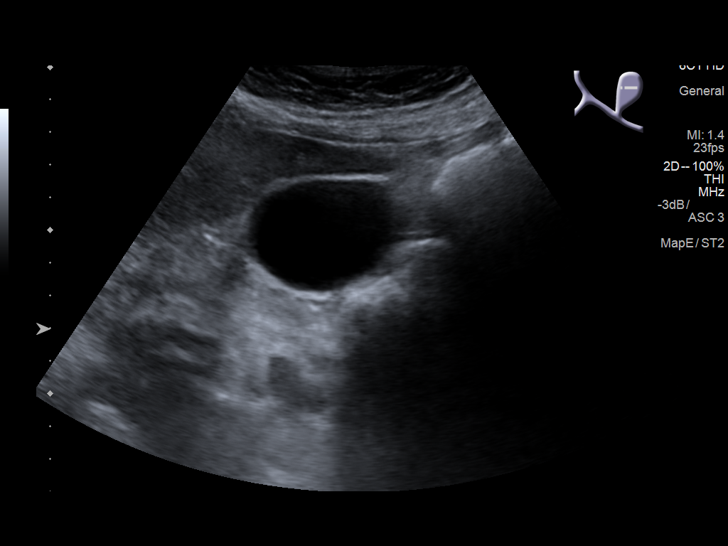
[im 16/43]
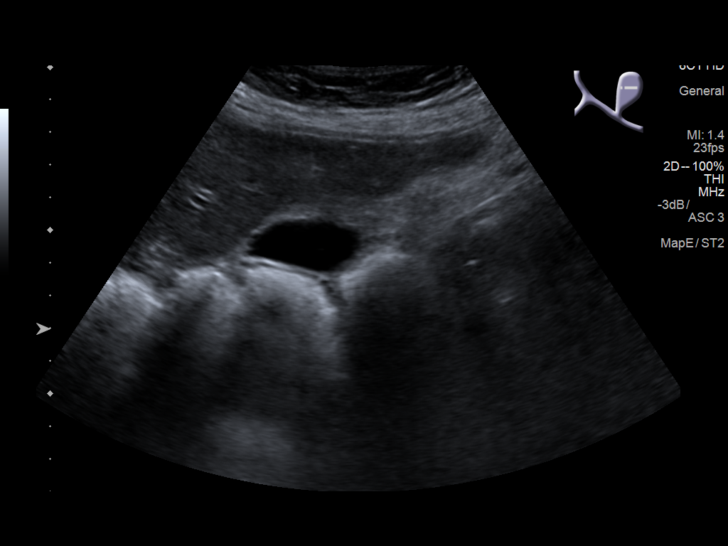
[im 20/43]
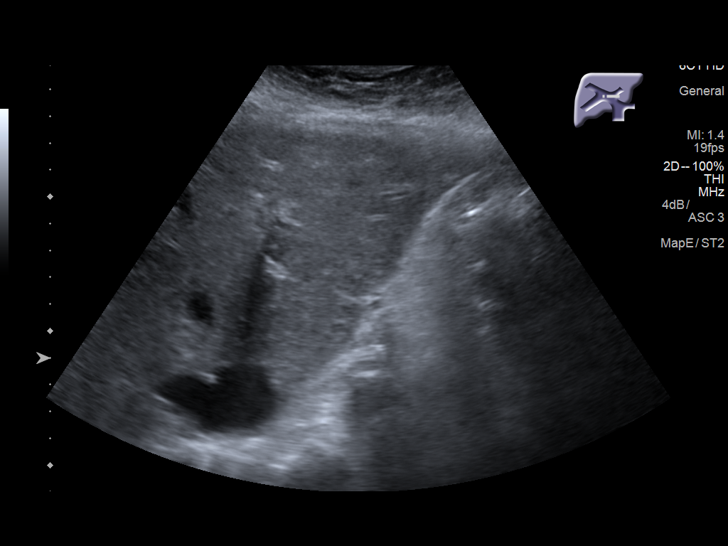
[im 23/43]
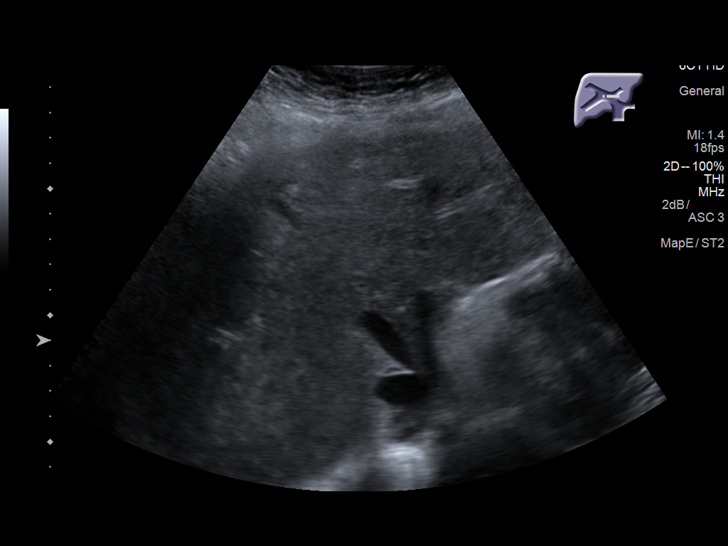
[im 27/43]
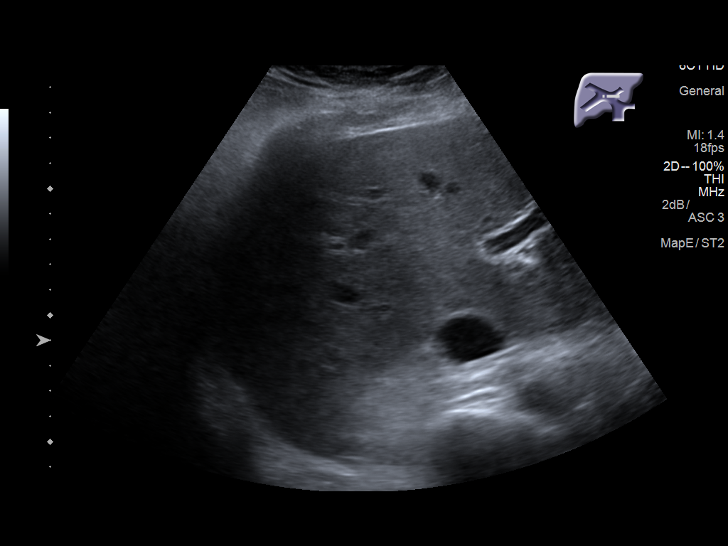
[im 29/43]
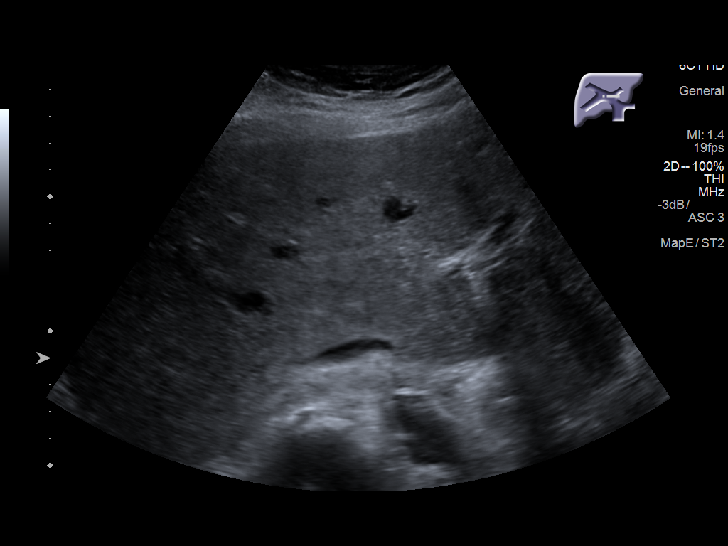
[im 32/43]
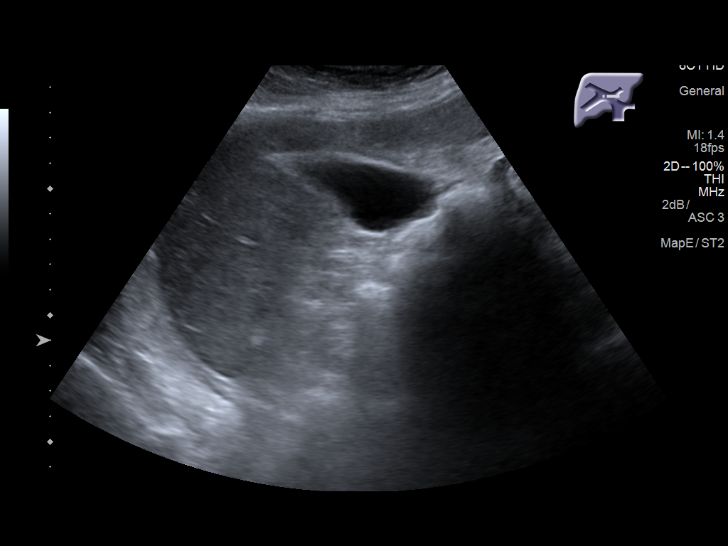
[im 36/43]
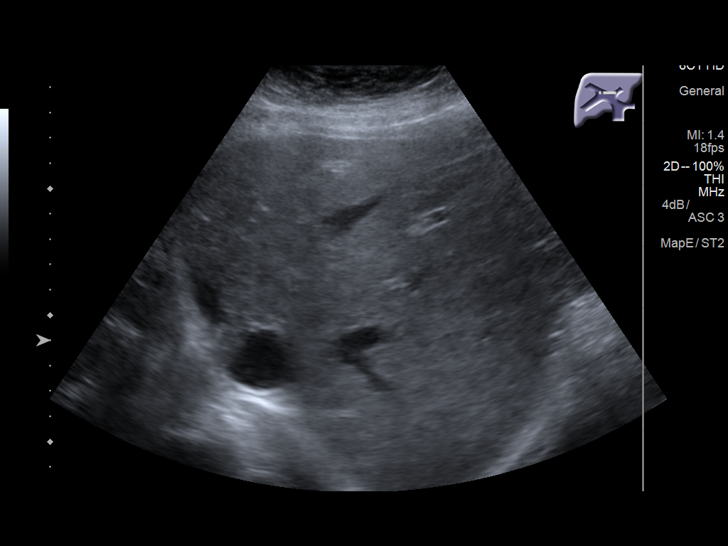
[im 39/43]
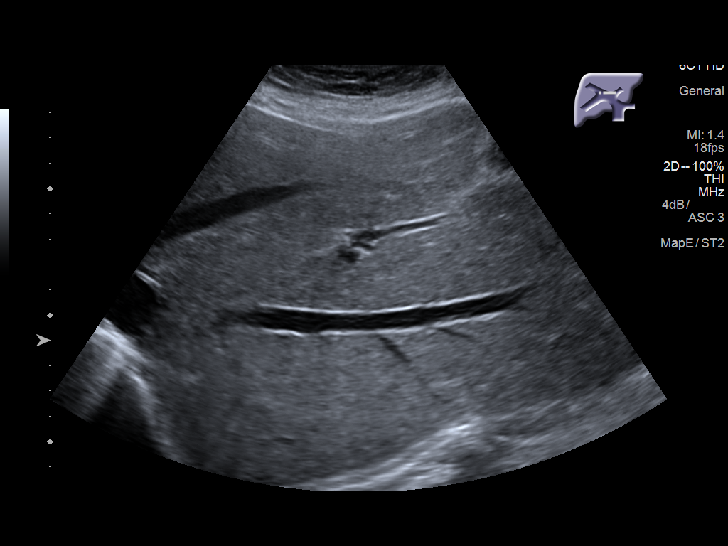
[im 43/43]
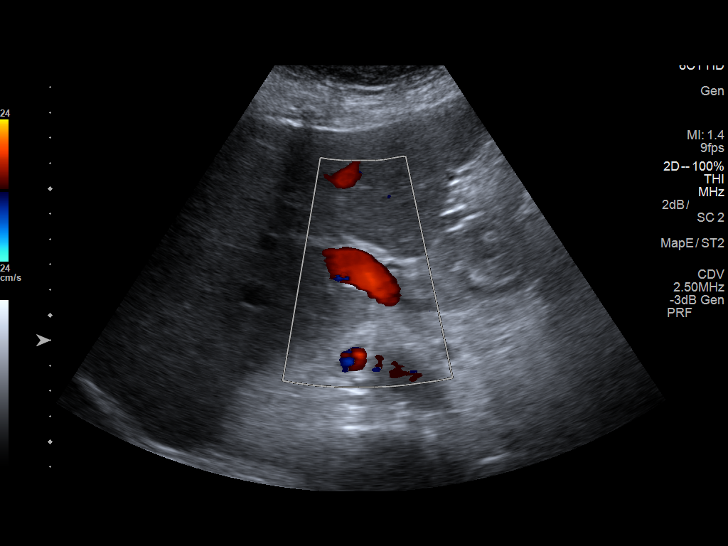

[14 of 25 positions shown; findings below may reference images not displayed]

FINDINGS: Gallbladder:

No evidence of gallbladder wall thickening as questioned on
yesterday's CT. No shadowing gallstones or gallbladder sludge.
Minimal free fluid surrounding the gallbladder. Negative sonographic
Murphy's sign according to the ultrasound technologist.

Common bile duct:

Diameter: Approximately 2 mm.

Liver:

Coarsened echotexture diffusely without focal hepatic parenchymal
abnormality. Portal vein is patent on color Doppler imaging with
normal direction of blood flow towards the liver.
IMPRESSION: 1. No sonographic evidence of cholecystitis. No evidence of
gallbladder wall thickening as questioned on yesterday's CT.
2. Coarsened hepatic echotexture indicating steatosis and/or
hepatocellular disease. No focal hepatic parenchymal abnormality.

## 2019-03-20 IMAGING — CR DG ABDOMEN 1V
2 series · 2 of 2 positions shown · non-contrast
Comparison: None.

CLINICAL DATA: Left lower abdominal pain for 3 days, nausea and
vomiting today.

EXAM:
ABDOMEN - 1 VIEW

[abdomen kub (1 of 2)]
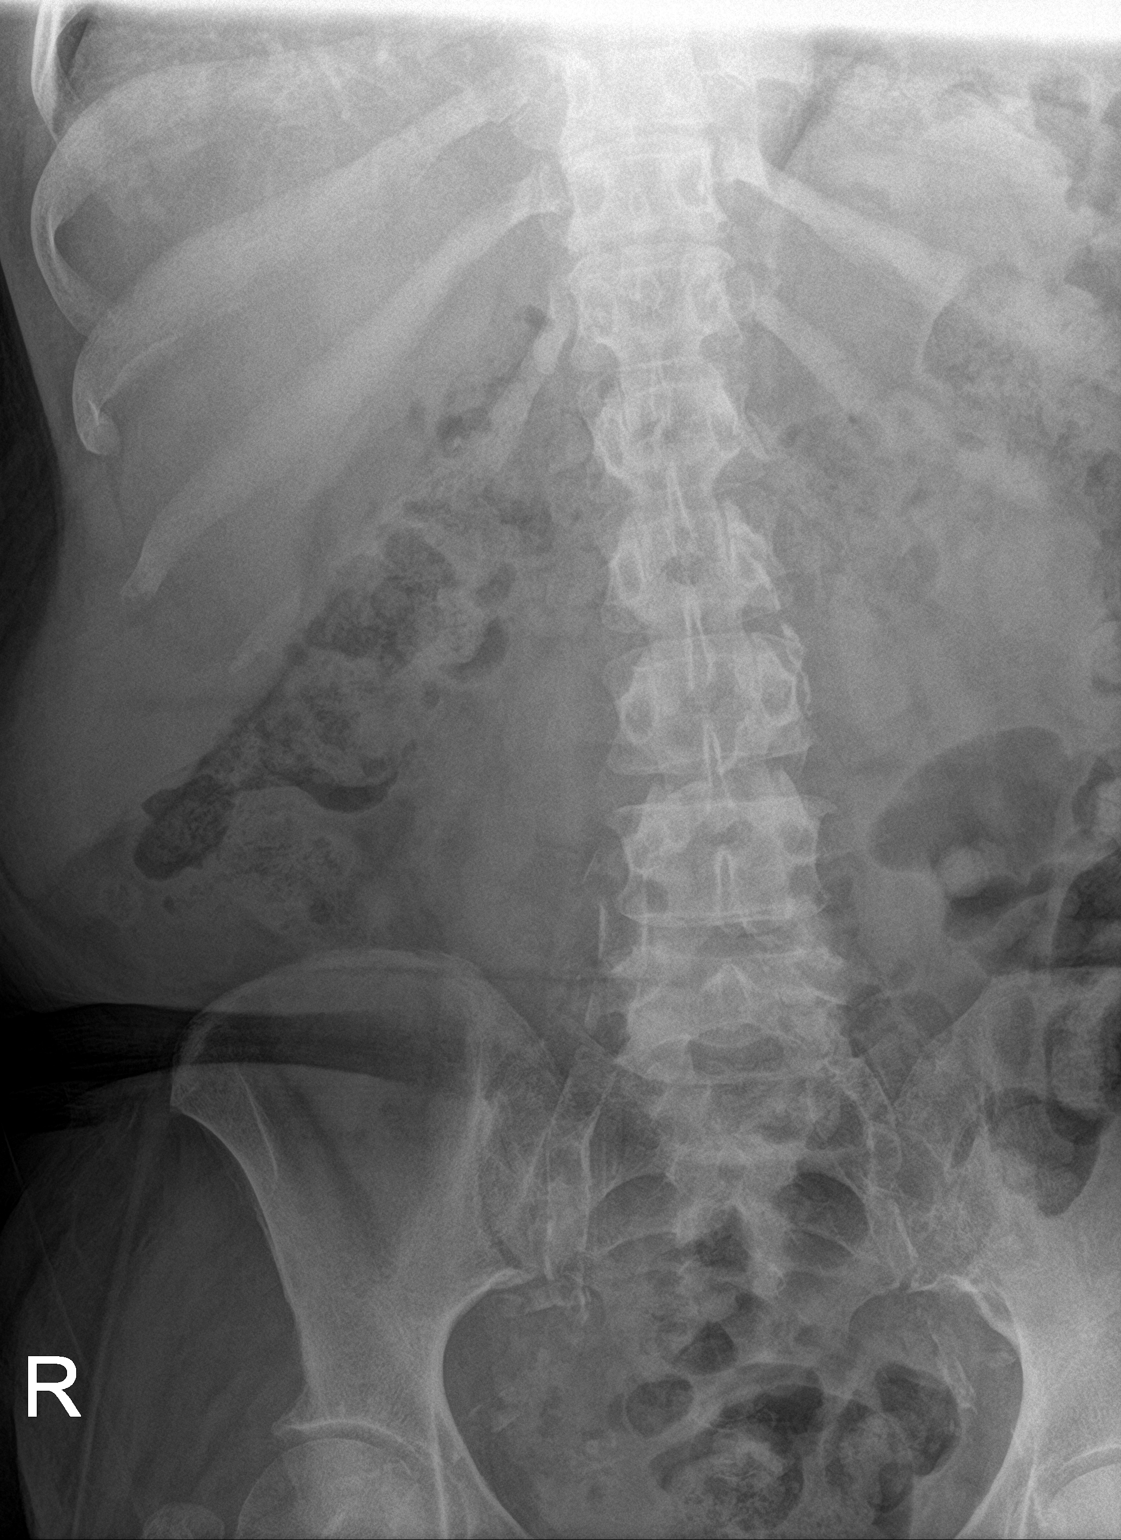

[abdomen kub (2 of 2)]
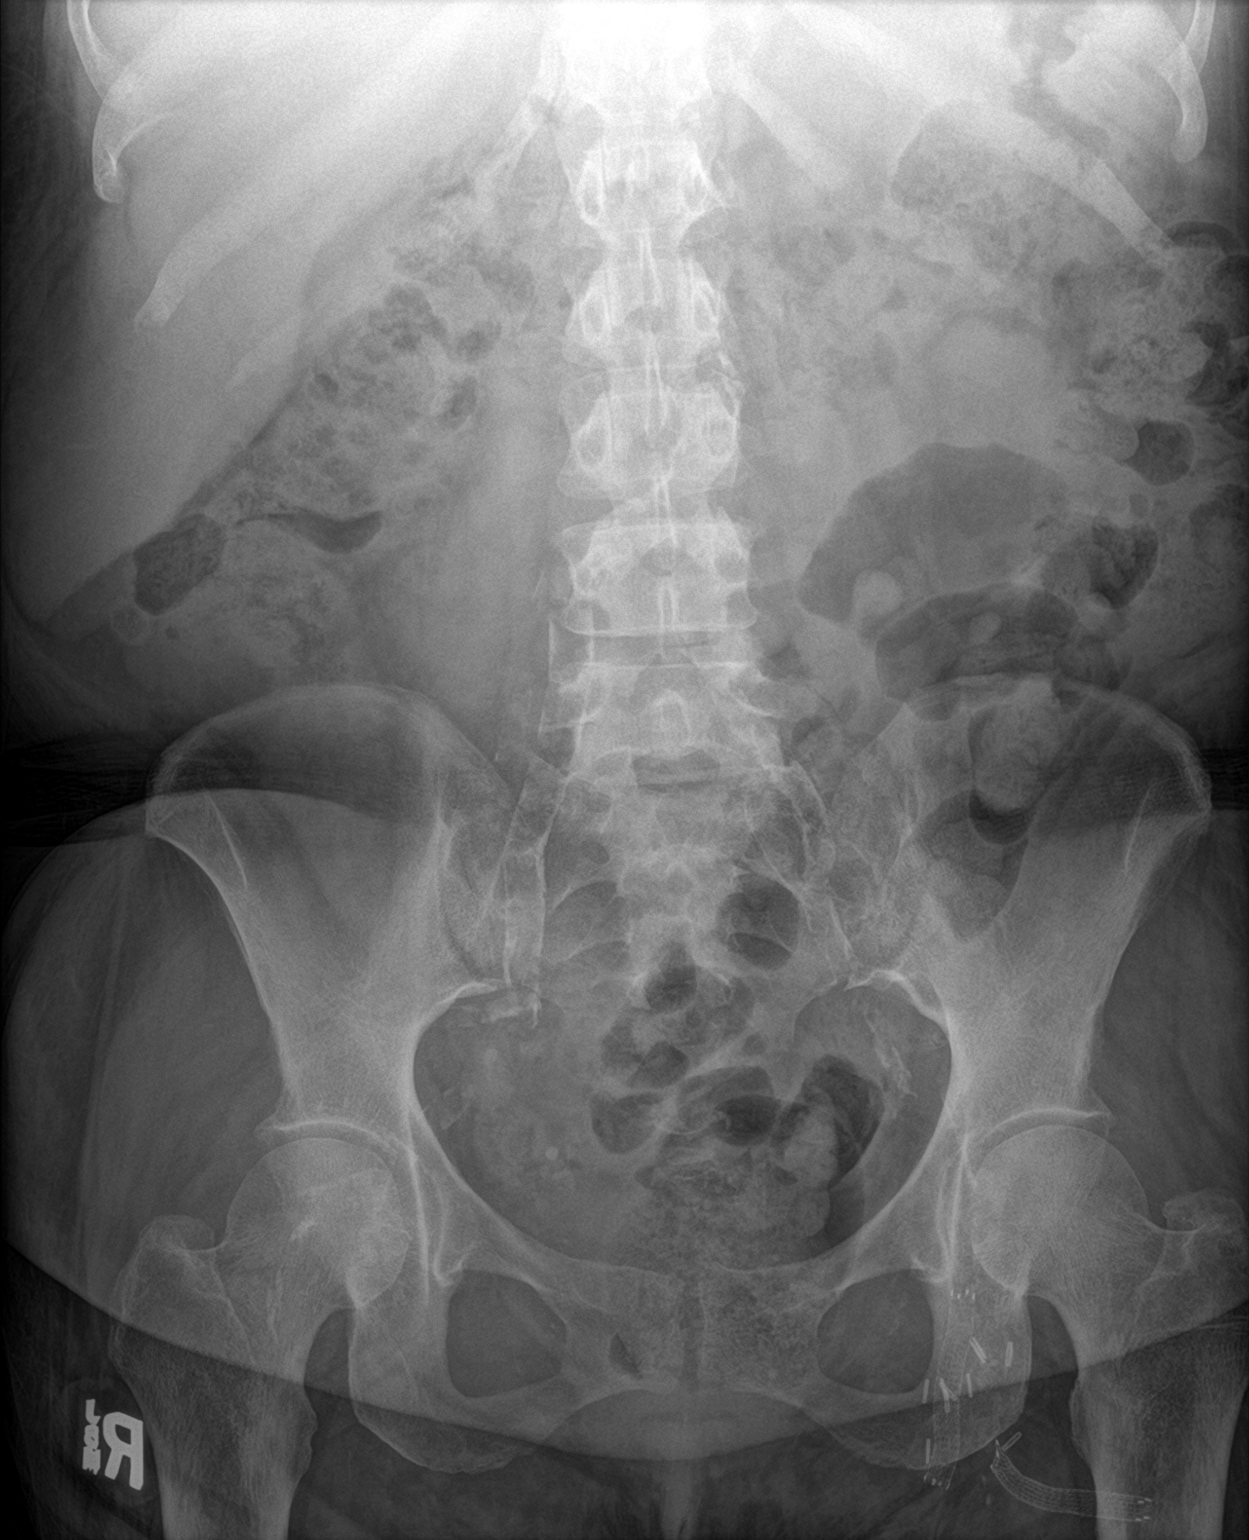

[2 of 2 positions shown; findings below may reference images not displayed]

FINDINGS: Bowel gas pattern is nonobstructive. Moderate amount of stool
throughout the nondistended colon.

No evidence of soft tissue mass or abnormal fluid collection. No
evidence of free intraperitoneal air.

Aortoiliac atherosclerosis. Surgical changes about the left inguinal
region. No acute or suspicious osseous finding.
IMPRESSION: No acute findings.  Nonobstructive bowel gas pattern.

Aortic atherosclerosis.

## 2019-04-16 IMAGING — CT CT HEAD W/O CM
4 series · 16 of 47 positions shown, 18 images · non-contrast
Comparison: CT head 03/25/2017 and earlier.

CLINICAL DATA: 71-year-old female with right side deficit.

EXAM:
CT HEAD WITHOUT CONTRAST
TECHNIQUE: Contiguous axial images were obtained from the base of the skull
through the vertex without intravenous contrast.

[Series 3: head wo · axial · 0.42mm/px · z∈[-157,-22]mm · 7 of 37 slices shown, 9 images]
[im 5/37  brain]
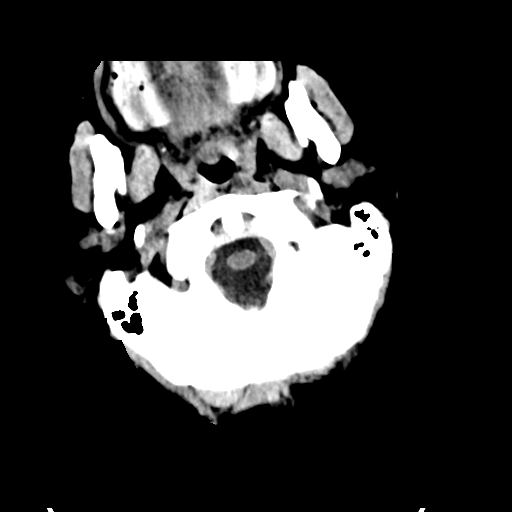
[im 5/37  bone]
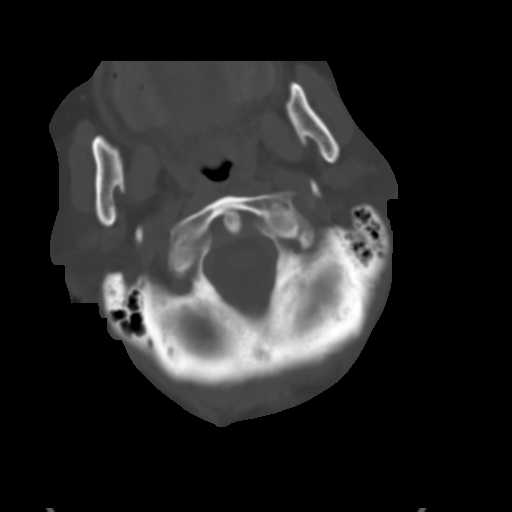
[im 10/37  brain]
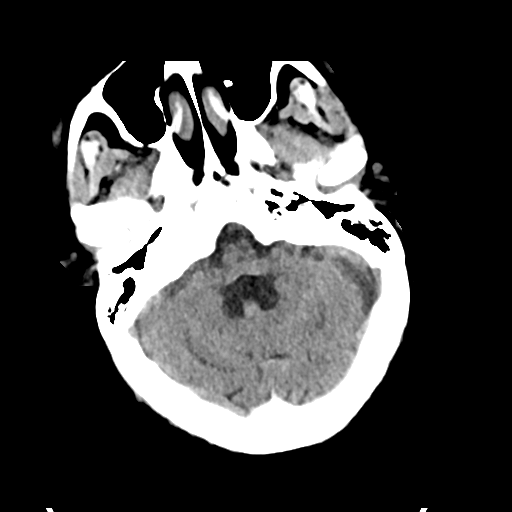
[im 14/37  brain]
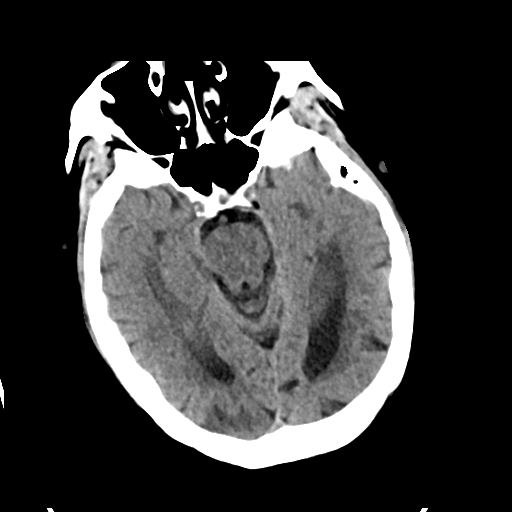
[im 19/37  brain]
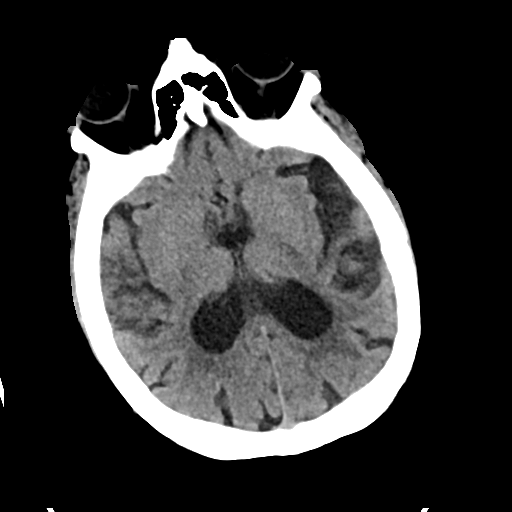
[im 23/37  brain]
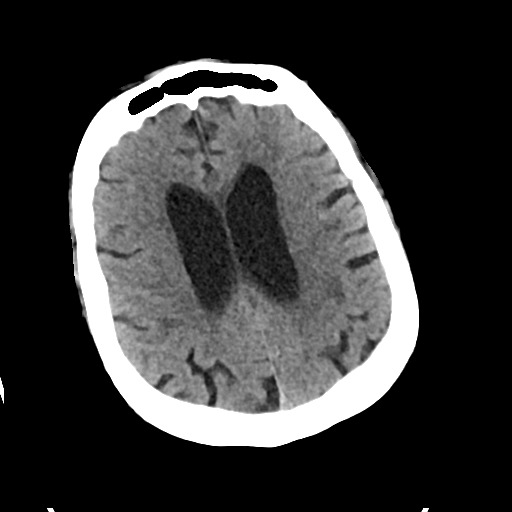
[im 23/37  bone]
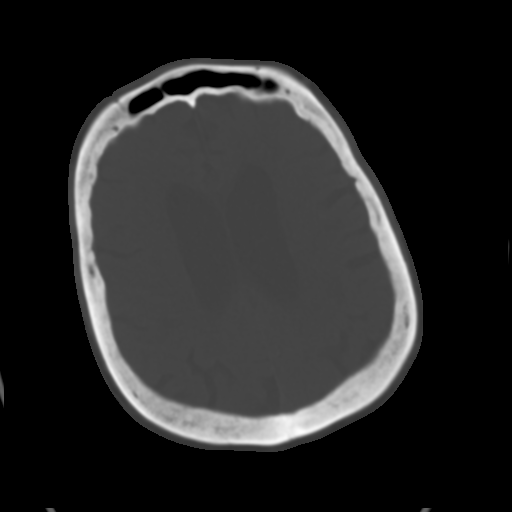
[im 28/37  brain]
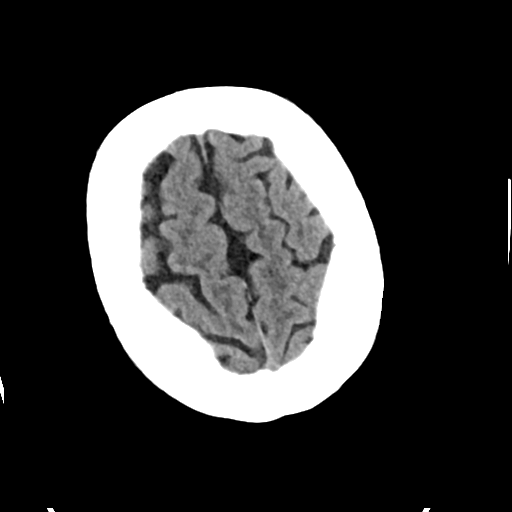
[im 32/37  brain]
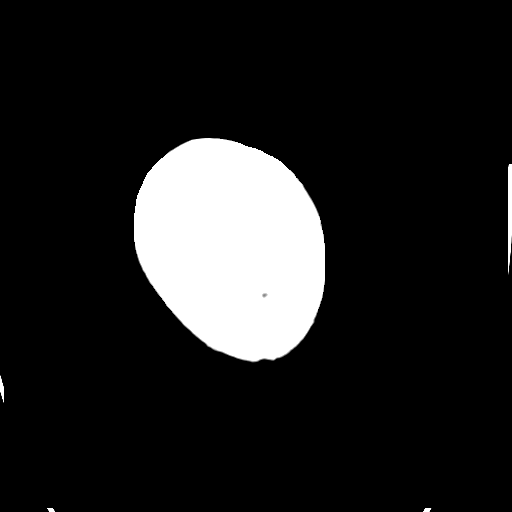

[Series 4: head bone · axial · 0.42mm/px · z∈[-159,-123]mm · 3 of 93 slices shown]
[im 10/93  bone]
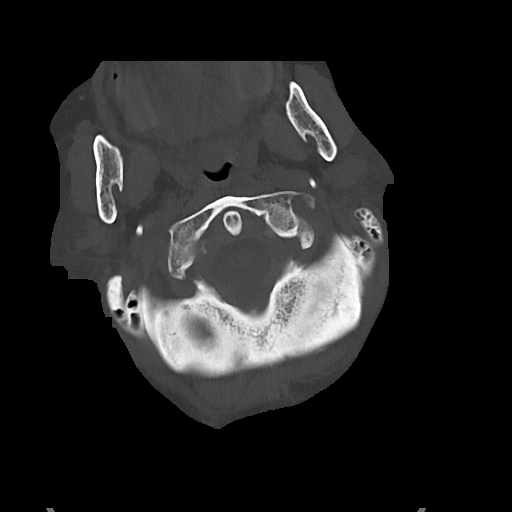
[im 19/93  bone]
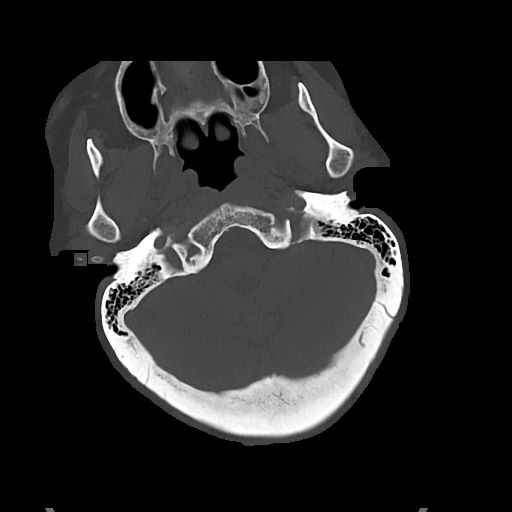
[im 28/93  bone]
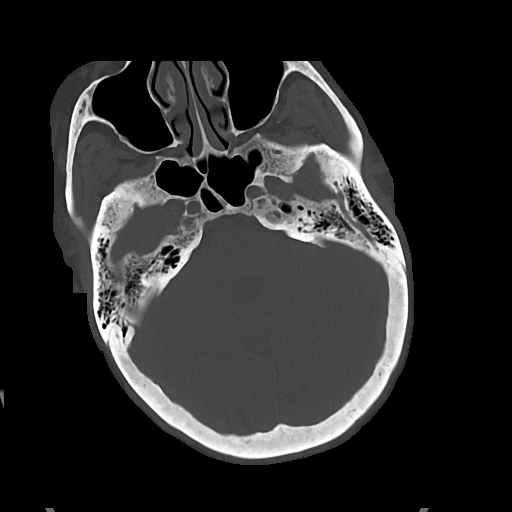

[Series 5: cor soft · coronal · 0.36mm/px · 3 of 69 slices shown]
[im 23/69  brain]
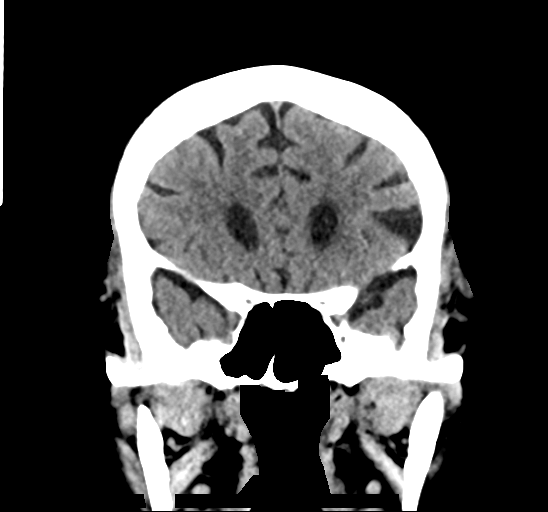
[im 31/69  brain]
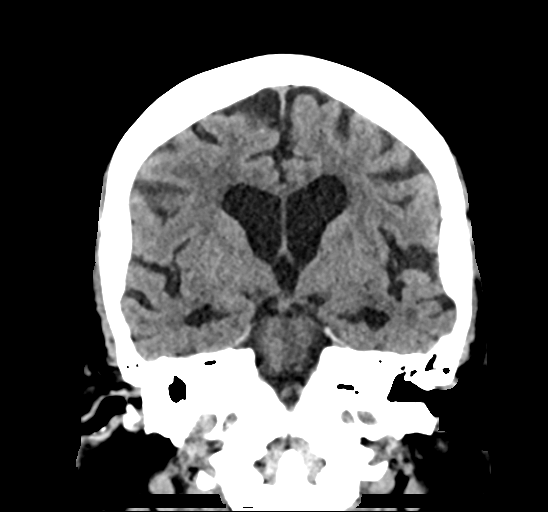
[im 38/69  brain]
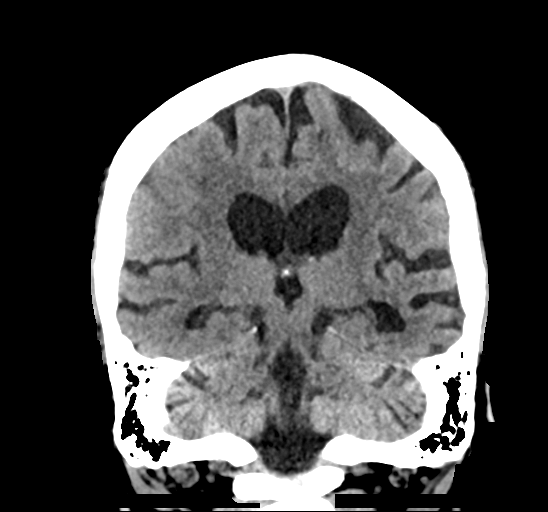

[Series 6: sag soft · sagittal · 0.36mm/px · 3 of 67 slices shown]
[im 23/67  brain]
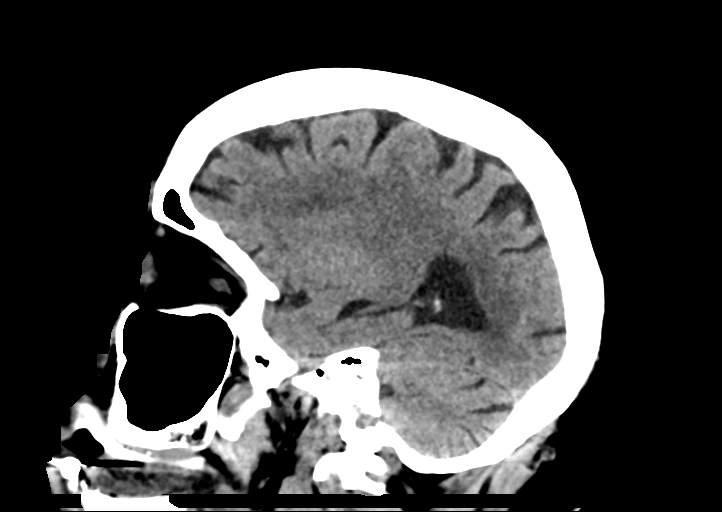
[im 34/67  brain]
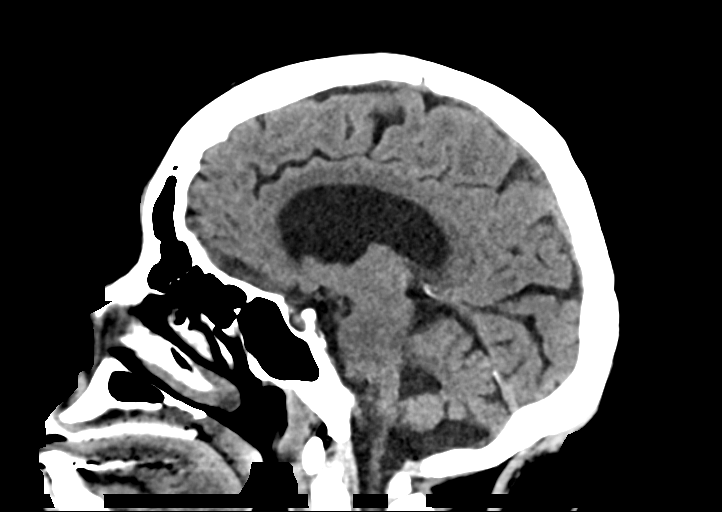
[im 45/67  brain]
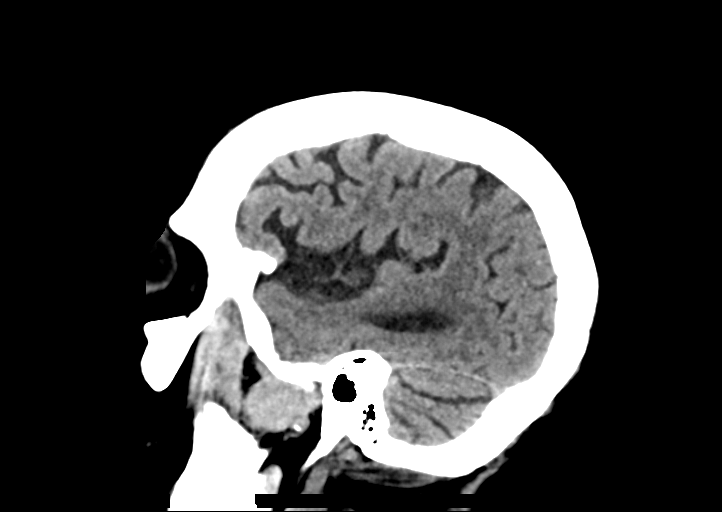

[16 of 47 positions shown; findings below may reference images not displayed]

FINDINGS: Brain: No acute intracranial hemorrhage identified. No midline
shift, mass effect, or evidence of intracranial mass lesion. No
ventriculomegaly.

Patchy bilateral white matter hypodensity, most pronounced about the
left frontal horn, appears stable. No cortically based acute infarct
identified.

Vascular: Calcified atherosclerosis at the skull base. No suspicious
intracranial vascular hyperdensity.

Skull: Chronic hyperostosis of the skull. No acute osseous
abnormality identified.

Sinuses/Orbits: Visualized paranasal sinuses and mastoids are stable
and well pneumatized.

Other: No acute orbit, scalp, or visible face soft tissue
abnormality.

ASPECTS (Alberta Stroke Program Early CT Score)

- Ganglionic level infarction (caudate, lentiform nuclei, internal
capsule, insula, M1-M3 cortex): 7

- Supraganglionic infarction (M4-M6 cortex): 3

Total score (0-10 with 10 being normal): 10
IMPRESSION: 1. Chronic white matter disease. No acute cortically based infarct
or intracranial hemorrhage identified.
2. ASPECTS is 10.
3. The above was relayed via text pager to Dr. MADINA TANIS on

## 2019-04-16 IMAGING — CT CT ABD-PELV W/O CM
2 of 5 series · 13 of 46 positions shown, 15 images · non-contrast
Comparison: 07/26/2017 chest x-ray 07/30/2015 CT without contrast

CLINICAL DATA: Mid abdominal pain with nausea and vomiting.
End-stage renal disease, dialysis dependent.

EXAM:
CT ABDOMEN AND PELVIS WITHOUT CONTRAST
TECHNIQUE: Multidetector CT imaging of the abdomen and pelvis was performed
following the standard protocol without IV contrast.

[Series 6: cor · coronal · 0.67mm/px · 3 of 70 slices shown]
[im 24/70  soft-tissue]
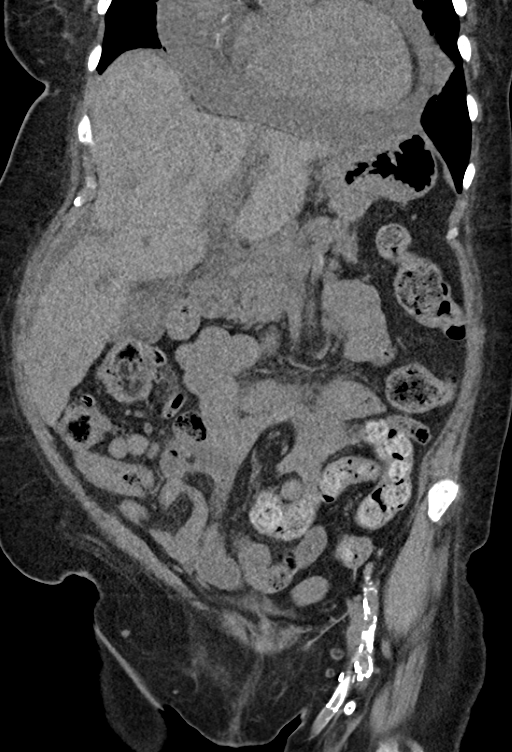
[im 31/70  soft-tissue]
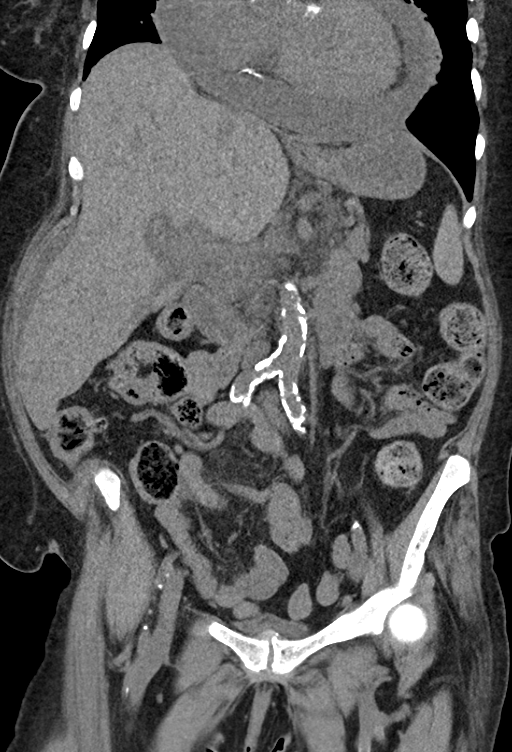
[im 39/70  soft-tissue]
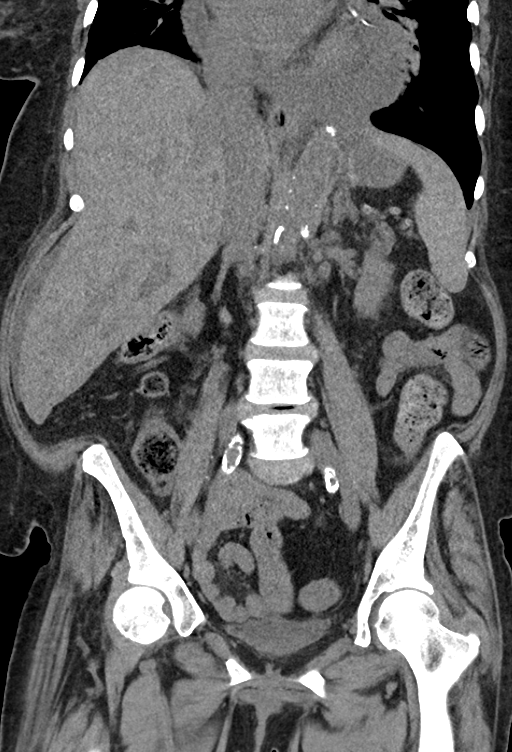

[Series 8: recon axial · axial · 0.57mm/px · z∈[+775,+1207]mm · 10 of 168 slices shown, 12 images]
[im 12/168  soft-tissue]
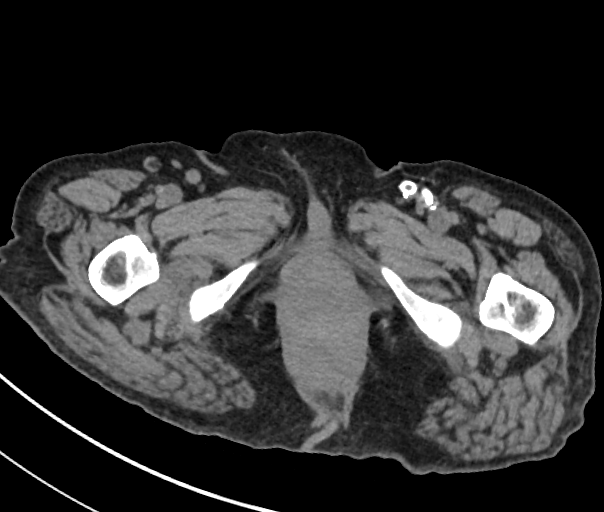
[im 12/168  bone]
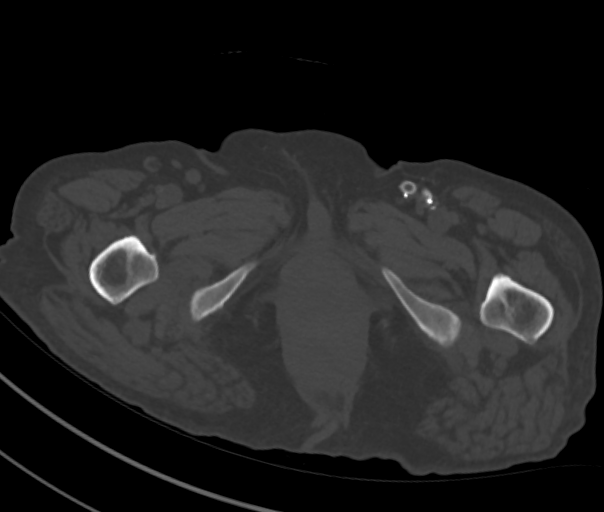
[im 24/168  soft-tissue]
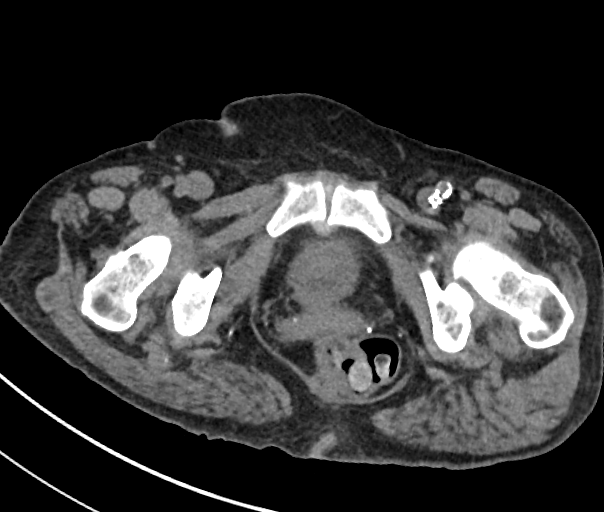
[im 48/168  soft-tissue]
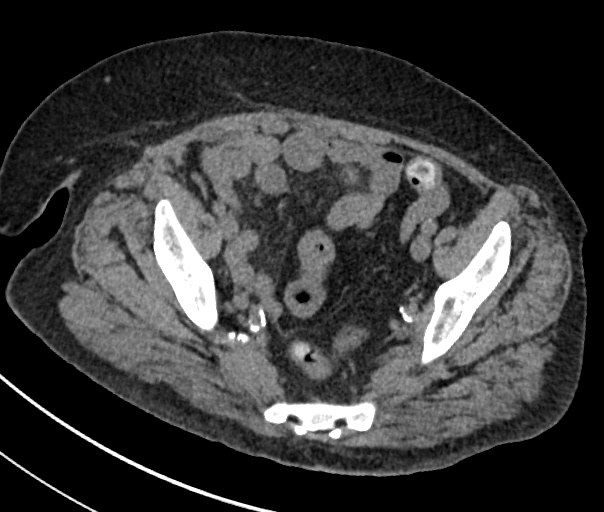
[im 60/168  soft-tissue]
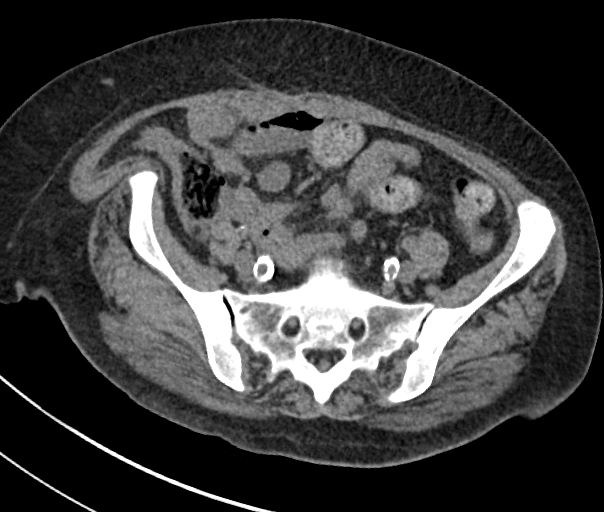
[im 72/168  soft-tissue]
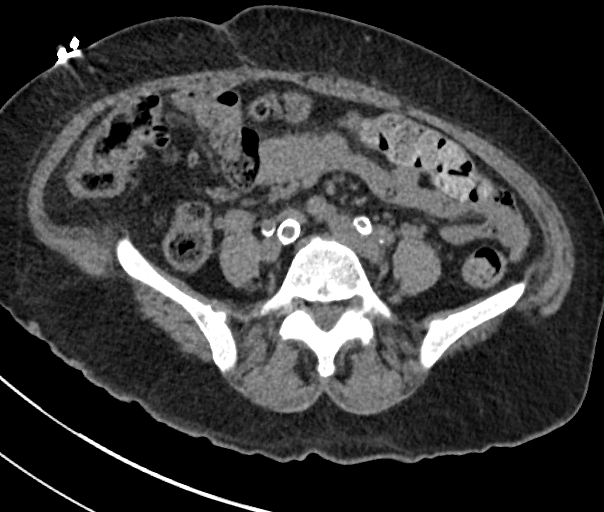
[im 96/168  soft-tissue]
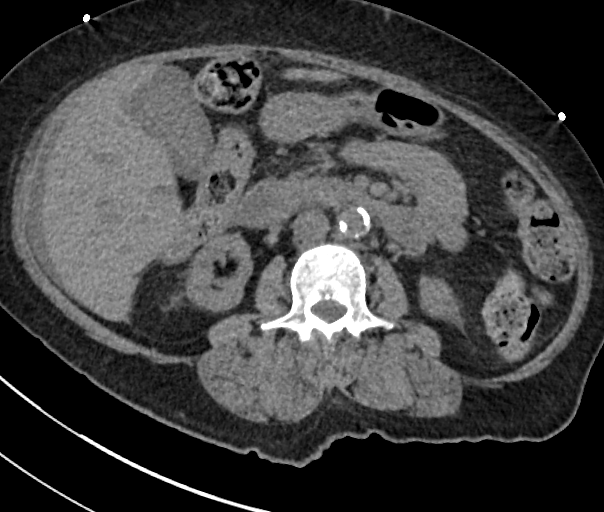
[im 108/168  soft-tissue]
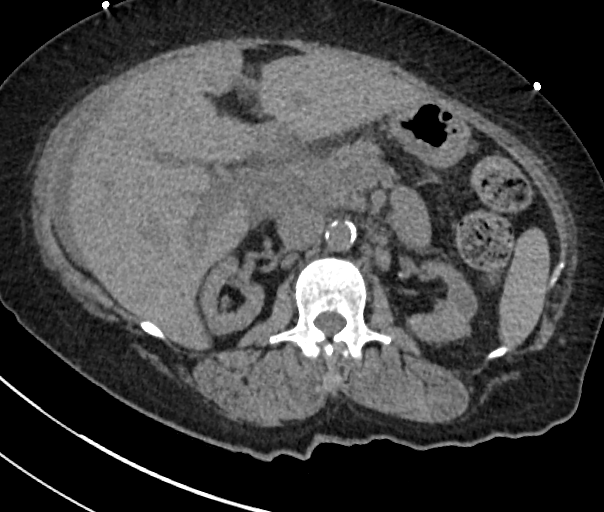
[im 120/168  soft-tissue]
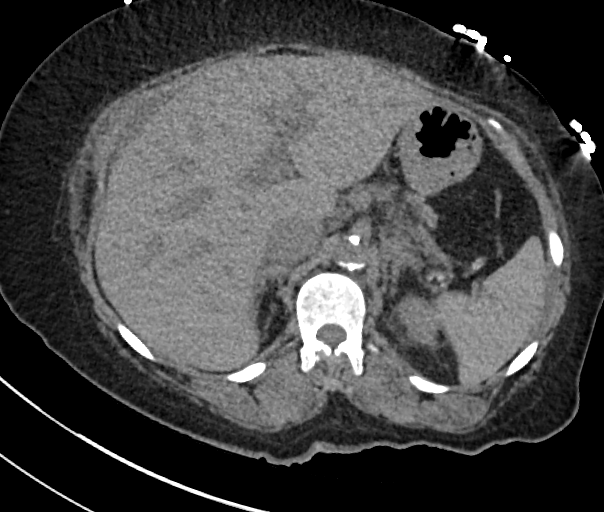
[im 144/168  soft-tissue]
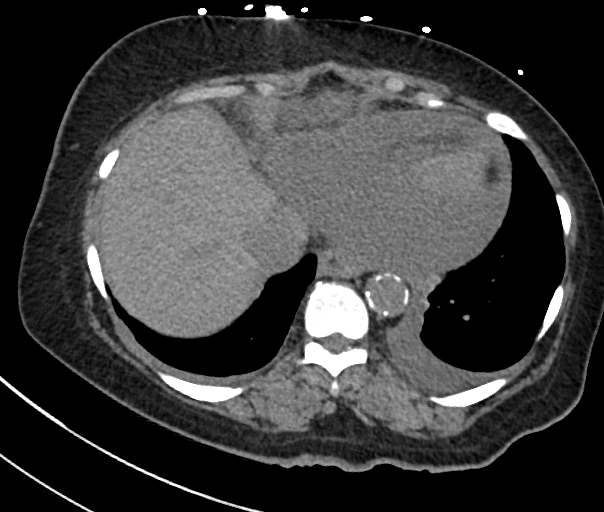
[im 144/168  bone]
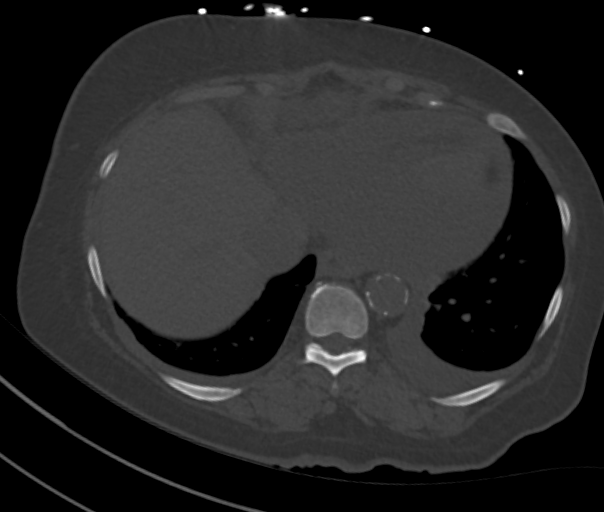
[im 156/168  soft-tissue]
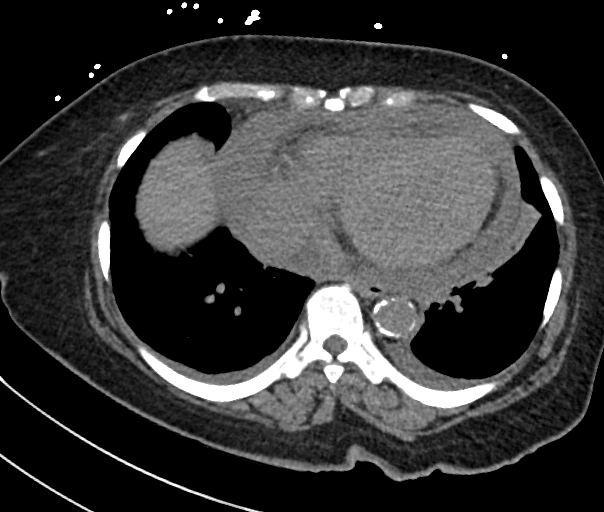

[13 of 46 positions shown; findings below may reference images not displayed]

FINDINGS: Lower chest: Large pericardial effusion. Small pleural effusions,
slightly larger on the left. Aorta is atherosclerotic. Streaky
bibasilar atelectasis. Degenerative changes of the lower thoracic
spine.

Hepatobiliary: Limited assessment without IV contrast. Diffuse
periportal edema evident with small amount of perihepatic ascites on
the right. Similar pattern of scattered small indeterminate hepatic
hypodensities all measuring 10 mm or less in size, suspect small
scattered hepatic cysts.

Gallbladder demonstrates wall thickening and pericholecystic fluid.
No surrounding inflammation. However, difficult to exclude
cholecystitis. Consider correlation with ultrasound. No gross
biliary obstruction. Gallbladder findings can also be seen with
chronic hepatic disease.

Pancreas: Limited assess without contrast. No large focal
abnormality or ductal dilatation. No surrounding fluid collection or
inflammation.

Spleen: Normal in size without focal abnormality.

Adrenals/Urinary Tract: Normal adrenal glands for age. Kidneys
demonstrate chronic perinephric strandy edema. Nonobstructing
intrarenal calculi bilaterally in the lower poles. Renal vascular
calcifications also evident. No acute obstructive uropathy or
hydronephrosis. Negative for hydroureter or ureteral obstruction.
Urinary bladder collapsed.

Stomach/Bowel: Negative for bowel obstruction, significant
dilatation, ileus, or free air. Scattered colonic diverticulosis.
Appendix is unremarkable. No fluid collection or abscess. Moderate
stool burden throughout the colon.

Vascular/Lymphatic: Extensive calcific atherosclerosis of the aorta
and iliac vessels. No retroperitoneal hemorrhage or hematoma. Left
femoral access graft noted.

On the right, the right internal iliac artery continues as a
persistent right sciatic artery, normal arterial variant anatomy.

Reproductive: Remote hysterectomy. No adnexal mass or abnormality.
No fluid collection or abscess.

Other: No inguinal or abdominal wall hernia.

Musculoskeletal: Degenerative changes of the spine SI joints. Vacuum
disc phenomena at L4-5 and L5-S1. No acute osseous finding.
IMPRESSION: Large pericardial effusion.

Small pleural effusions with bibasilar atelectasis

Periportal hepatic edema and small amount of perihepatic ascites
with probable scattered small hepatic cysts. No biliary obstruction.

Gallbladder wall thickening suspected with pericholecystic fluid.
Difficult to exclude cholecystitis. Consider ultrasound correlation.
Gallbladder findings can also be seen with chronic hepatic disease.

Nonobstructing nephrolithiasis

Extensive aortoiliac atherosclerosis

Persistent right sciatic artery noted, normal vascular variant.

## 2020-03-15 ENCOUNTER — Encounter: Payer: Self-pay | Admitting: General Practice
